# Patient Record
Sex: Female | Born: 1980 | Race: White | Hispanic: No | Smoking: Never smoker
Health system: Southern US, Community
[De-identification: ages and names within clinical notes are randomized; demographics above are authoritative.]

## PROBLEM LIST (undated history)

## (undated) ENCOUNTER — Inpatient Hospital Stay: Payer: Self-pay

## (undated) DIAGNOSIS — L409 Psoriasis, unspecified: Secondary | ICD-10-CM

## (undated) DIAGNOSIS — Z9189 Other specified personal risk factors, not elsewhere classified: Secondary | ICD-10-CM

## (undated) DIAGNOSIS — Z803 Family history of malignant neoplasm of breast: Secondary | ICD-10-CM

## (undated) DIAGNOSIS — N159 Renal tubulo-interstitial disease, unspecified: Secondary | ICD-10-CM

## (undated) DIAGNOSIS — R87612 Low grade squamous intraepithelial lesion on cytologic smear of cervix (LGSIL): Secondary | ICD-10-CM

## (undated) DIAGNOSIS — G43909 Migraine, unspecified, not intractable, without status migrainosus: Secondary | ICD-10-CM

## (undated) DIAGNOSIS — R51 Headache: Secondary | ICD-10-CM

## (undated) DIAGNOSIS — J45909 Unspecified asthma, uncomplicated: Secondary | ICD-10-CM

## (undated) DIAGNOSIS — K5000 Crohn's disease of small intestine without complications: Secondary | ICD-10-CM

## (undated) DIAGNOSIS — I10 Essential (primary) hypertension: Secondary | ICD-10-CM

## (undated) DIAGNOSIS — K649 Unspecified hemorrhoids: Secondary | ICD-10-CM

## (undated) DIAGNOSIS — N809 Endometriosis, unspecified: Secondary | ICD-10-CM

## (undated) DIAGNOSIS — K589 Irritable bowel syndrome without diarrhea: Secondary | ICD-10-CM

## (undated) DIAGNOSIS — Z889 Allergy status to unspecified drugs, medicaments and biological substances status: Secondary | ICD-10-CM

## (undated) DIAGNOSIS — R519 Headache, unspecified: Secondary | ICD-10-CM

## (undated) DIAGNOSIS — G8929 Other chronic pain: Secondary | ICD-10-CM

## (undated) DIAGNOSIS — E669 Obesity, unspecified: Secondary | ICD-10-CM

## (undated) DIAGNOSIS — F419 Anxiety disorder, unspecified: Secondary | ICD-10-CM

## (undated) DIAGNOSIS — F319 Bipolar disorder, unspecified: Secondary | ICD-10-CM

## (undated) DIAGNOSIS — M1990 Unspecified osteoarthritis, unspecified site: Secondary | ICD-10-CM

## (undated) DIAGNOSIS — L4050 Arthropathic psoriasis, unspecified: Secondary | ICD-10-CM

## (undated) DIAGNOSIS — Z1371 Encounter for nonprocreative screening for genetic disease carrier status: Secondary | ICD-10-CM

## (undated) HISTORY — DX: Other chronic pain: G89.29

## (undated) HISTORY — DX: Crohn's disease of small intestine without complications: K50.00

## (undated) HISTORY — DX: Irritable bowel syndrome without diarrhea: K58.9

## (undated) HISTORY — PX: COLONOSCOPY: SHX174

## (undated) HISTORY — DX: Allergy status to unspecified drugs, medicaments and biological substances: Z88.9

## (undated) HISTORY — DX: Endometriosis, unspecified: N80.9

## (undated) HISTORY — DX: Arthropathic psoriasis, unspecified: L40.50

## (undated) HISTORY — DX: Bipolar disorder, unspecified: F31.9

## (undated) HISTORY — DX: Family history of malignant neoplasm of breast: Z80.3

## (undated) HISTORY — PX: EAR TUBE REMOVAL: SHX1486

## (undated) HISTORY — DX: Low grade squamous intraepithelial lesion on cytologic smear of cervix (LGSIL): R87.612

## (undated) HISTORY — DX: Other specified personal risk factors, not elsewhere classified: Z91.89

## (undated) HISTORY — PX: BREAST BIOPSY: SHX20

## (undated) HISTORY — DX: Anxiety disorder, unspecified: F41.9

## (undated) HISTORY — PX: COLONOSCOPY W/ BIOPSIES: SHX1374

## (undated) HISTORY — PX: TUBAL LIGATION: SHX77

## (undated) HISTORY — DX: Unspecified hemorrhoids: K64.9

## (undated) HISTORY — PX: LAPAROSCOPIC HYSTERECTOMY: SHX1926

## (undated) HISTORY — DX: Obesity, unspecified: E66.9

## (undated) HISTORY — DX: Unspecified osteoarthritis, unspecified site: M19.90

## (undated) HISTORY — DX: Headache: R51

## (undated) HISTORY — PX: ESOPHAGOGASTRODUODENOSCOPY: SHX1529

## (undated) HISTORY — DX: Headache, unspecified: R51.9

## (undated) HISTORY — PX: TYMPANOSTOMY TUBE PLACEMENT: SHX32

## (undated) HISTORY — PX: LAPAROSCOPIC CHOLECYSTECTOMY: SUR755

## (undated) HISTORY — DX: Unspecified asthma, uncomplicated: J45.909

## (undated) HISTORY — DX: Encounter for nonprocreative screening for genetic disease carrier status: Z13.71

## (undated) HISTORY — DX: Psoriasis, unspecified: L40.9

---

## 2004-04-30 ENCOUNTER — Ambulatory Visit: Payer: Self-pay | Admitting: Family Medicine

## 2004-08-26 ENCOUNTER — Ambulatory Visit: Payer: Self-pay | Admitting: Family Medicine

## 2004-08-29 ENCOUNTER — Ambulatory Visit: Payer: Self-pay | Admitting: Family Medicine

## 2004-10-14 ENCOUNTER — Ambulatory Visit: Payer: Self-pay | Admitting: Family Medicine

## 2004-11-21 ENCOUNTER — Emergency Department: Payer: Self-pay | Admitting: Emergency Medicine

## 2005-03-31 ENCOUNTER — Ambulatory Visit: Payer: Self-pay | Admitting: Internal Medicine

## 2005-05-13 ENCOUNTER — Ambulatory Visit: Payer: Self-pay | Admitting: Family Medicine

## 2005-08-13 ENCOUNTER — Ambulatory Visit: Payer: Self-pay | Admitting: Internal Medicine

## 2005-08-18 ENCOUNTER — Ambulatory Visit: Payer: Self-pay | Admitting: Internal Medicine

## 2005-09-01 ENCOUNTER — Ambulatory Visit: Payer: Self-pay | Admitting: Internal Medicine

## 2005-09-10 ENCOUNTER — Ambulatory Visit: Payer: Self-pay | Admitting: Family Medicine

## 2005-10-28 ENCOUNTER — Ambulatory Visit: Payer: Self-pay | Admitting: Internal Medicine

## 2005-10-31 ENCOUNTER — Ambulatory Visit: Payer: Self-pay | Admitting: Internal Medicine

## 2005-11-03 ENCOUNTER — Ambulatory Visit: Payer: Self-pay | Admitting: Cardiology

## 2005-11-05 ENCOUNTER — Ambulatory Visit: Payer: Self-pay | Admitting: Internal Medicine

## 2006-01-21 ENCOUNTER — Ambulatory Visit: Payer: Self-pay | Admitting: Internal Medicine

## 2006-03-23 ENCOUNTER — Ambulatory Visit: Payer: Self-pay | Admitting: Internal Medicine

## 2006-03-24 ENCOUNTER — Emergency Department: Payer: Self-pay | Admitting: Emergency Medicine

## 2006-04-27 ENCOUNTER — Ambulatory Visit: Payer: Self-pay | Admitting: Internal Medicine

## 2006-05-19 ENCOUNTER — Ambulatory Visit: Payer: Self-pay | Admitting: Internal Medicine

## 2006-05-28 ENCOUNTER — Ambulatory Visit: Payer: Self-pay | Admitting: Internal Medicine

## 2006-08-29 DIAGNOSIS — F410 Panic disorder [episodic paroxysmal anxiety] without agoraphobia: Secondary | ICD-10-CM

## 2006-09-30 ENCOUNTER — Ambulatory Visit: Payer: Self-pay | Admitting: Family Medicine

## 2007-08-09 ENCOUNTER — Ambulatory Visit: Payer: Self-pay | Admitting: Internal Medicine

## 2007-08-09 DIAGNOSIS — R109 Unspecified abdominal pain: Secondary | ICD-10-CM | POA: Insufficient documentation

## 2007-08-12 ENCOUNTER — Ambulatory Visit: Payer: Self-pay | Admitting: Internal Medicine

## 2007-08-12 DIAGNOSIS — K625 Hemorrhage of anus and rectum: Secondary | ICD-10-CM

## 2007-08-12 LAB — CONVERTED CEMR LAB
ALT: 65 units/L — ABNORMAL HIGH (ref 0–35)
AST: 40 units/L — ABNORMAL HIGH (ref 0–37)
Albumin: 3.8 g/dL (ref 3.5–5.2)
Alkaline Phosphatase: 91 units/L (ref 39–117)
Amylase: 68 units/L (ref 27–131)
BUN: 15 mg/dL (ref 6–23)
Basophils Absolute: 0.2 10*3/uL — ABNORMAL HIGH (ref 0.0–0.1)
Basophils Relative: 2.4 % — ABNORMAL HIGH (ref 0.0–1.0)
Bilirubin, Direct: 0.1 mg/dL (ref 0.0–0.3)
CO2: 29 meq/L (ref 19–32)
Calcium: 9.3 mg/dL (ref 8.4–10.5)
Chloride: 106 meq/L (ref 96–112)
Creatinine, Ser: 0.7 mg/dL (ref 0.4–1.2)
Eosinophils Absolute: 0.2 10*3/uL (ref 0.0–0.6)
Eosinophils Relative: 2.2 % (ref 0.0–5.0)
GFR calc Af Amer: 132 mL/min
GFR calc non Af Amer: 109 mL/min
Glucose, Bld: 80 mg/dL (ref 70–99)
H Pylori IgG: NEGATIVE
HCT: 41.3 % (ref 36.0–46.0)
Hemoglobin: 13.7 g/dL (ref 12.0–15.0)
Lipase: 32 units/L (ref 11.0–59.0)
Lymphocytes Relative: 25.1 % (ref 12.0–46.0)
MCHC: 33.1 g/dL (ref 30.0–36.0)
MCV: 87.2 fL (ref 78.0–100.0)
Monocytes Absolute: 0.2 10*3/uL (ref 0.2–0.7)
Monocytes Relative: 2.9 % — ABNORMAL LOW (ref 3.0–11.0)
Neutro Abs: 4.7 10*3/uL (ref 1.4–7.7)
Neutrophils Relative %: 67.4 % (ref 43.0–77.0)
Phosphorus: 3.9 mg/dL (ref 2.3–4.6)
Platelets: 281 10*3/uL (ref 150–400)
Potassium: 4.1 meq/L (ref 3.5–5.1)
RBC: 4.74 M/uL (ref 3.87–5.11)
RDW: 11.8 % (ref 11.5–14.6)
Sodium: 141 meq/L (ref 135–145)
TSH: 1.15 microintl units/mL (ref 0.35–5.50)
Total Bilirubin: 0.4 mg/dL (ref 0.3–1.2)
Total Protein: 7.1 g/dL (ref 6.0–8.3)
WBC: 7.1 10*3/uL (ref 4.5–10.5)

## 2007-08-16 ENCOUNTER — Ambulatory Visit: Payer: Self-pay | Admitting: Internal Medicine

## 2007-08-22 DIAGNOSIS — K589 Irritable bowel syndrome without diarrhea: Secondary | ICD-10-CM

## 2007-08-22 DIAGNOSIS — K5000 Crohn's disease of small intestine without complications: Secondary | ICD-10-CM

## 2007-08-22 DIAGNOSIS — K649 Unspecified hemorrhoids: Secondary | ICD-10-CM

## 2007-08-22 HISTORY — DX: Crohn's disease of small intestine without complications: K50.00

## 2007-08-22 HISTORY — DX: Unspecified hemorrhoids: K64.9

## 2007-08-22 HISTORY — DX: Irritable bowel syndrome, unspecified: K58.9

## 2007-08-23 ENCOUNTER — Encounter: Payer: Self-pay | Admitting: Internal Medicine

## 2007-08-23 ENCOUNTER — Ambulatory Visit: Payer: Self-pay | Admitting: Internal Medicine

## 2007-08-23 LAB — HM COLONOSCOPY

## 2007-09-13 ENCOUNTER — Ambulatory Visit: Payer: Self-pay | Admitting: Internal Medicine

## 2007-09-16 ENCOUNTER — Encounter: Admission: RE | Admit: 2007-09-16 | Discharge: 2007-09-16 | Payer: Self-pay | Admitting: Internal Medicine

## 2007-09-22 ENCOUNTER — Ambulatory Visit: Payer: Self-pay | Admitting: Internal Medicine

## 2007-09-27 ENCOUNTER — Ambulatory Visit: Payer: Self-pay | Admitting: Internal Medicine

## 2007-10-18 ENCOUNTER — Ambulatory Visit (HOSPITAL_COMMUNITY): Admission: RE | Admit: 2007-10-18 | Discharge: 2007-10-18 | Payer: Self-pay | Admitting: Internal Medicine

## 2007-10-18 ENCOUNTER — Encounter: Payer: Self-pay | Admitting: Internal Medicine

## 2007-10-21 ENCOUNTER — Ambulatory Visit: Payer: Self-pay | Admitting: Internal Medicine

## 2007-10-25 ENCOUNTER — Encounter (INDEPENDENT_AMBULATORY_CARE_PROVIDER_SITE_OTHER): Payer: Self-pay | Admitting: *Deleted

## 2007-10-25 ENCOUNTER — Ambulatory Visit (HOSPITAL_COMMUNITY): Admission: RE | Admit: 2007-10-25 | Discharge: 2007-10-25 | Payer: Self-pay | Admitting: Internal Medicine

## 2007-11-22 HISTORY — PX: LAPAROSCOPIC CHOLECYSTECTOMY: SUR755

## 2007-11-30 ENCOUNTER — Encounter: Payer: Self-pay | Admitting: Internal Medicine

## 2007-12-02 ENCOUNTER — Ambulatory Visit (HOSPITAL_COMMUNITY): Admission: RE | Admit: 2007-12-02 | Discharge: 2007-12-02 | Payer: Self-pay | Admitting: General Surgery

## 2007-12-02 ENCOUNTER — Encounter (INDEPENDENT_AMBULATORY_CARE_PROVIDER_SITE_OTHER): Payer: Self-pay | Admitting: General Surgery

## 2007-12-27 ENCOUNTER — Ambulatory Visit: Payer: Self-pay | Admitting: Internal Medicine

## 2007-12-27 LAB — CONVERTED CEMR LAB: Rapid Strep: NEGATIVE

## 2008-01-11 ENCOUNTER — Ambulatory Visit: Payer: Self-pay | Admitting: Family Medicine

## 2008-02-04 ENCOUNTER — Ambulatory Visit: Payer: Self-pay | Admitting: Family Medicine

## 2008-02-04 DIAGNOSIS — J029 Acute pharyngitis, unspecified: Secondary | ICD-10-CM

## 2008-02-04 LAB — CONVERTED CEMR LAB: Rapid Strep: NEGATIVE

## 2008-04-07 ENCOUNTER — Ambulatory Visit: Payer: Self-pay | Admitting: Family Medicine

## 2008-04-07 DIAGNOSIS — R062 Wheezing: Secondary | ICD-10-CM

## 2008-06-29 ENCOUNTER — Ambulatory Visit: Payer: Self-pay | Admitting: Family Medicine

## 2008-06-29 DIAGNOSIS — L259 Unspecified contact dermatitis, unspecified cause: Secondary | ICD-10-CM

## 2008-08-18 ENCOUNTER — Emergency Department: Payer: Self-pay | Admitting: Internal Medicine

## 2008-08-18 ENCOUNTER — Emergency Department (HOSPITAL_COMMUNITY): Admission: EM | Admit: 2008-08-18 | Discharge: 2008-08-19 | Payer: Self-pay | Admitting: Emergency Medicine

## 2008-11-08 ENCOUNTER — Ambulatory Visit: Payer: Self-pay | Admitting: Family Medicine

## 2008-11-08 ENCOUNTER — Encounter (INDEPENDENT_AMBULATORY_CARE_PROVIDER_SITE_OTHER): Payer: Self-pay | Admitting: Internal Medicine

## 2008-11-16 ENCOUNTER — Ambulatory Visit: Payer: Self-pay | Admitting: Emergency Medicine

## 2009-01-01 ENCOUNTER — Ambulatory Visit: Payer: Self-pay | Admitting: Emergency Medicine

## 2009-01-01 ENCOUNTER — Encounter: Payer: Self-pay | Admitting: Emergency Medicine

## 2009-03-30 ENCOUNTER — Ambulatory Visit: Payer: Self-pay | Admitting: Family Medicine

## 2009-04-01 ENCOUNTER — Emergency Department: Payer: Self-pay | Admitting: Emergency Medicine

## 2009-04-04 ENCOUNTER — Ambulatory Visit: Payer: Self-pay | Admitting: Orthopedic Surgery

## 2009-05-22 ENCOUNTER — Ambulatory Visit: Payer: Self-pay | Admitting: Family Medicine

## 2009-05-22 DIAGNOSIS — J209 Acute bronchitis, unspecified: Secondary | ICD-10-CM

## 2009-08-10 ENCOUNTER — Telehealth: Payer: Self-pay | Admitting: Family Medicine

## 2009-08-10 ENCOUNTER — Encounter: Payer: Self-pay | Admitting: Family Medicine

## 2009-08-13 ENCOUNTER — Ambulatory Visit: Payer: Self-pay | Admitting: Family Medicine

## 2009-08-13 DIAGNOSIS — R5383 Other fatigue: Secondary | ICD-10-CM

## 2009-08-13 DIAGNOSIS — R5381 Other malaise: Secondary | ICD-10-CM

## 2009-08-15 ENCOUNTER — Telehealth: Payer: Self-pay | Admitting: Family Medicine

## 2009-08-15 LAB — CONVERTED CEMR LAB
BUN: 11 mg/dL (ref 6–23)
Basophils Absolute: 0 10*3/uL (ref 0.0–0.1)
Basophils Relative: 0.5 % (ref 0.0–3.0)
CO2: 29 meq/L (ref 19–32)
Calcium: 8.8 mg/dL (ref 8.4–10.5)
Chloride: 111 meq/L (ref 96–112)
Creatinine, Ser: 0.7 mg/dL (ref 0.4–1.2)
Eosinophils Absolute: 0.3 10*3/uL (ref 0.0–0.7)
Eosinophils Relative: 4.5 % (ref 0.0–5.0)
Folate: 5.5 ng/mL
GFR calc non Af Amer: 107.02 mL/min (ref >60)
Glucose, Bld: 96 mg/dL (ref 70–99)
HCT: 41.7 % (ref 36.0–46.0)
Hemoglobin: 13.8 g/dL (ref 12.0–15.0)
Lymphocytes Relative: 26.7 % (ref 12.0–46.0)
Lymphs Abs: 1.6 10*3/uL (ref 0.7–4.0)
MCHC: 33.2 g/dL (ref 30.0–36.0)
MCV: 89.2 fL (ref 78.0–100.0)
Monocytes Absolute: 0.3 10*3/uL (ref 0.1–1.0)
Monocytes Relative: 4.4 % (ref 3.0–12.0)
Neutro Abs: 3.8 10*3/uL (ref 1.4–7.7)
Neutrophils Relative %: 63.9 % (ref 43.0–77.0)
Platelets: 229 10*3/uL (ref 150.0–400.0)
Potassium: 4 meq/L (ref 3.5–5.1)
RBC: 4.67 M/uL (ref 3.87–5.11)
RDW: 11.9 % (ref 11.5–14.6)
Rheumatoid fact SerPl-aCnc: 27.1 intl units/mL — ABNORMAL HIGH (ref 0.0–20.0)
Sed Rate: 11 mm/hr (ref 0–22)
Sodium: 143 meq/L (ref 135–145)
TSH: 0.73 microintl units/mL (ref 0.35–5.50)
Vitamin B-12: 472 pg/mL (ref 211–911)
WBC: 6 10*3/uL (ref 4.5–10.5)

## 2009-08-20 ENCOUNTER — Encounter: Admission: RE | Admit: 2009-08-20 | Discharge: 2009-08-20 | Payer: Self-pay | Admitting: Family Medicine

## 2009-09-17 ENCOUNTER — Encounter: Payer: Self-pay | Admitting: Family Medicine

## 2009-10-08 ENCOUNTER — Encounter: Payer: Self-pay | Admitting: Internal Medicine

## 2009-12-06 ENCOUNTER — Encounter: Payer: Self-pay | Admitting: Family Medicine

## 2009-12-06 ENCOUNTER — Ambulatory Visit: Payer: Self-pay | Admitting: Internal Medicine

## 2010-02-08 ENCOUNTER — Ambulatory Visit: Payer: Self-pay | Admitting: Family Medicine

## 2010-02-08 LAB — CONVERTED CEMR LAB
Bilirubin Urine: NEGATIVE
Nitrite: NEGATIVE
Urobilinogen, UA: 0.2

## 2010-02-09 ENCOUNTER — Encounter: Payer: Self-pay | Admitting: Family Medicine

## 2010-02-18 ENCOUNTER — Encounter: Admission: RE | Admit: 2010-02-18 | Discharge: 2010-02-18 | Payer: Self-pay | Admitting: Family Medicine

## 2010-03-21 ENCOUNTER — Telehealth: Payer: Self-pay | Admitting: Family Medicine

## 2010-03-21 ENCOUNTER — Ambulatory Visit: Payer: Self-pay | Admitting: Family Medicine

## 2010-03-25 ENCOUNTER — Encounter: Payer: Self-pay | Admitting: Internal Medicine

## 2010-07-22 ENCOUNTER — Other Ambulatory Visit: Payer: Self-pay | Admitting: *Deleted

## 2010-07-22 DIAGNOSIS — Z1239 Encounter for other screening for malignant neoplasm of breast: Secondary | ICD-10-CM

## 2010-07-23 NOTE — Progress Notes (Signed)
Summary: needs work note   Phone Note Call from Patient Call back at [phone]   Caller: Patient Call For: Dr. Dayton Martes Summary of Call: Patient says that she missed three days of work this week due to a stomach virus. She says that she did not feel that she needed to be seen nor did she feel well enough to come in, she returned to work this morning and her manager said that she needs a work note. Patient wants to know if she can get a work note for tue,wed,thurs of this week.  Initial call taken by: Melody Comas,  August 10, 2009 9:56 AM  Follow-up for Phone Call        pt scheduled with Dr. Dayton Martes @ 2:15 DeShannon Katrinka Blazing CMA Duncan Dull)  August 10, 2009 9:57 AM   per Dr. Dayton Martes, pt does not need to be seen, she needs work note. Spoke with pt and gave work note. Follow-up by: Mervin Hack CMA Duncan Dull),  August 10, 2009 10:01 AM     Appended Document: needs work note  LMOM at work to call.

## 2010-07-23 NOTE — Progress Notes (Signed)
Summary: pt agrees to referral  Phone Note Call from Patient Call back at [phone]   Caller: Patient Call For: Dr. Dayton Martes Summary of Call: Advised pt of lab results.  She agrees to rheumatology referral, she has no preference where. Initial call taken by: Lowella Petties CMA,  August 15, 2009 10:16 AM

## 2010-07-23 NOTE — Letter (Signed)
Summary: Annual Exam-[facility]  Annual Exam-[facility]   Imported By: Maryln Gottron 04/01/2010 14:11:15  _____________________________________________________________________  External Attachment:    Type:   Image     Comment:   External Document  Appended Document: Annual [facility] Normal exam Ultrasound ordered for pain

## 2010-07-23 NOTE — Consult Note (Signed)
Summary: [facility]  [facility]   Imported By: Lanelle Bal 09/28/2009 08:29:36  _____________________________________________________________________  External Attachment:    Type:   Image     Comment:   External Document  Appended Document: [facility] Rheumatology myalgias and arthritis with pos RA with h/o psoriasis. anti CCP pending. Started NSAIDs.

## 2010-07-23 NOTE — Progress Notes (Signed)
Summary: still has kidney stone  Phone Note Call from Patient Call back at [phone]   Caller: Patient Complaint: Cough/Sore throat Summary of Call: Pt was seen last month for a kidney stoney.  She says this has not passed and she is in a lot of pain today. Vomiting today with this, she says she doesnt tolerate pain very well.  She is asking what she needs to do.   She doesnt have any pain medicine, other than taking 800 mg's of ibuprofen daily for hip pain.  Uses [facility].   No known fever.  Pain is on left side and in the back.  Blood in urine yesterday at work, by dip. Initial call taken by: Lowella Petties CMA,  March 21, 2010 12:39 PM  Follow-up for Phone Call        She needs an OV. Follow-up by: Crawford Givens MD,  March 21, 2010 1:29 PM  Additional Follow-up for Phone Call Additional follow up Details #1::        Appointment scheduled  3:30 p.m. today. Additional Follow-up by: Delilah Shan CMA Duncan Dull),  March 21, 2010 2:26 PM

## 2010-07-23 NOTE — Assessment & Plan Note (Signed)
Summary: CHECK LUMPS IN BREAST,JOINT PAIN/CLE   Vital Signs:  Patient profile:   30 year old female Height:      63 inches Weight:      187.25 pounds BMI:     33.29 Temp:     98.2 degrees F oral Pulse rate:   76 / minute Pulse rhythm:   regular BP sitting:   112 / 80  (left arm) Cuff size:   large  Vitals Entered By: Delilah Shan CMA Duncan Dull) (August 13, 2009 8:19 AM) CC: Check lump in breast.  Joint pain.   History of Present Illness: 30 yo here for acute visit with multiple complaints.  1.  Lump in left breast- felt lump in left breast last week.  Was painful to palpation, around 12 oclock position.  Was on her period last week, now lump is gone but she is worried because her mom had breast cancer at 10 yo.  She has also been very fatigued lately and concerned this is related.  No changes in appetite, weight loss, night sweats.  2.  Fatigue- Ongoing for at least a year, feels it is getting worse.  Denies any symptoms of depression.  No DOE. Just feels tired regardless of how much sleep she gets.  Periods are heavy but regular.  No sycope, pre sycope, LE edema or chest pain.  No HA.  3.  All over joint pain- feels that all joints hurt, on and off for years.  Mainly bilateral hands a elbows.  Sometimes are red and swollen, other times are not.  Pain is worse in the morning gets better throughout the day.  Does have FH of RA.  Current Medications (verified): 1)  Ventolin Hfa 108 (90 Base) Mcg/act Aers (Albuterol Sulfate) .... 2 Puffs Q4h As Needed Wheezing 2)  Ortho Tri-Cyclen Lo 0.025 Mg Tabs (Norgestimate-Ethinyl Estradiol) .... One Daily 3)  Benadryl 25 Mg Caps (Diphenhydramine Hcl) .... Once Daily 4)  Zyrtec Allergy 10 Mg Caps (Cetirizine Hcl) .... Take 1 Tablet By Mouth Once A Day  Allergies: 1)  ! * Ketek 2)  ! Prozac  Review of Systems      See HPI General:  Denies chills and fever. Eyes:  Denies blurring. CV:  Denies chest pain or discomfort. Resp:  Denies shortness  of breath. GI:  Denies abdominal pain and bloody stools. MS:  Denies muscle weakness. Derm:  Denies rash. Psych:  Denies anxiety and depression. Heme:  Denies abnormal bruising, bleeding, and fevers.  Physical Exam  General:  Well-developed,well-nourished,in no acute distress; alert,appropriate and cooperative throughout examination Ears:  External ear exam shows no significant lesions or deformities.  Otoscopic examination reveals clear canals, tympanic membranes are intact bilaterally without bulging, retraction, inflammation or discharge. Hearing is grossly normal bilaterally. Mouth:  no deformity or lesions Neck:  No deformities, masses, or tenderness noted. Breasts:  No mass, nodules, thickening, tenderness, bulging, retraction, inflamation, nipple discharge or skin changes noted.   Lungs:  Normal respiratory effort, chest expands symmetrically. Lungs are clear to auscultation, no crackles or wheezes. Heart:  Normal rate and regular rhythm. S1 and S2 normal without gallop, murmur, click, rub or other extra sounds. Abdomen:  Bowel sounds positive,abdomen soft and non-tender without masses, organomegaly or hernias noted. Msk:  no joint swelling, no joint warmth, no redness over joints, no joint deformities, no joint instability, and no crepitation.   Extremities:  no edema Axillary Nodes:  No palpable lymphadenopathy Psych:  Cognition and judgment appear intact. Alert and  cooperative with normal attention span and concentration. No apparent delusions, illusions, hallucinations   Impression & Recommendations:  Problem # 1:  BREAST MASS, LEFT (ICD-611.72) Assessment New Unable to find lump but given family history will go ahead an send for mammogram. Orders: Radiology Referral (Radiology)  Problem # 2:  UNSPECIFIED ARTHROPATHY MULTIPLE SITES (ICD-716.99) Assessment: New Ongoing for several months, most likely a pain syndrome/fibromyalgia.  Will check blood work today to rule out  other polyarthropathies.  No signs of join redness, swelling or tenderness on physical exam today. Orders: Venipuncture (71062) TLB-Rheumatoid Factor (RA) (69485-IO) TLB-Sedimentation Rate (ESR) (85652-ESR)  Problem # 3:  FATIGUE (ICD-780.79) Assessment: New Perhaps related to above, but will check B12/folate, CBC, BMET and TSH to rule out other reversible causes. Orders: Venipuncture (27035) TLB-B12 + Folate Pnl (00938_18299-B71/IRC) TLB-CBC Platelet - w/Differential (85025-CBCD) TLB-BMP (Basic Metabolic Panel-BMET) (80048-METABOL) TLB-TSH (Thyroid Stimulating Hormone) (84443-TSH)  Complete Medication List: 1)  Ventolin Hfa 108 (90 Base) Mcg/act Aers (Albuterol sulfate) .... 2 puffs q4h as needed wheezing 2)  Ortho Tri-cyclen Lo 0.025 Mg Tabs (Norgestimate-ethinyl estradiol) .... One daily 3)  Benadryl 25 Mg Caps (Diphenhydramine hcl) .... Once daily 4)  Zyrtec Allergy 10 Mg Caps (Cetirizine hcl) .... Take 1 tablet by mouth once a day  Patient Instructions: 1)  Good to see you. 2)  Please stop by to see Shirlee Limerick on your way out to set up your mammogram.  Current Allergies (reviewed today): ! * KETEK ! PROZAC

## 2010-07-23 NOTE — Letter (Signed)
Summary: Out of Work  [facility] [employer] [facility]  [address] [place], [place] [address]   Phone: [phone]  Fax: [phone]    August 10, 2009   Employee:  Bridget Maldonado    To Whom It May Concern:   For Medical reasons, please excuse the above named employee from work for the following dates:  Start:   08/07/2009  End:   08/09/2009 patient returning to work 08/10/2009  If you need additional information, please feel free to contact our office.         Sincerely,      Ruthe Mannan, MD

## 2010-07-23 NOTE — Assessment & Plan Note (Signed)
Summary: ? kidney stones/lsf   Vital Signs:  Patient profile:   30 year old female Height:      63 inches Weight:      183.75 pounds BMI:     32.67 Temp:     98.2 degrees F oral Pulse rate:   92 / minute Pulse rhythm:   regular BP sitting:   106 / 84  (left arm) Cuff size:   large  Vitals Entered By: Delilah Shan CMA Duncan Dull) (March 21, 2010 3:26 PM) CC: ? kidney stones.  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  Pt. has her menstrual period today.   History of Present Illness: Sporadic back pain.  Blood in urine yesterday but patient is on menses.  Was checked 2 weeks ago and had blood on dipstick at that point, too.  "It's never a continual pain".  When it happens "I get goosebumps from the pain."  Similar to when patient had GB pain (s/p chole).  Has follow up with gyn on Monday.    No stone was collected from the first episode- it was a presumed stone.  See prev OV note.   Vomited this AM, "but I think that was from having a low pain tolerance."  "I've always thrown up if I have pain."  No vomiting since this AM.  No fevers and none recently.    Pain is in lower abdomen and occ bilateral.    Mother had endometriosis at about this stage in life.    Taking ibuprofen 800mg .  Intolerant of tramadol.   H/o episodic wheeze, rare SABA use, needs refill.   Allergies: 1)  ! * Ketek 2)  ! Prozac 3)  ! Tramadol Hcl  Review of Systems       See HPI.  Otherwise negative.  no vag discharge   Physical Exam  General:  GEN: nad, alert and oriented HEENT: mucous membranes moist NECK: supple w/o LA CV: rrr.  PULM: ctab, no inc wob ABD: soft, +bs, no rebound, no pain on exam EXT: no edema SKIN: no acute rash    Impression & Recommendations:  Problem # 1:  ABDOMINAL PAIN, ACUTE (ICD-789.00) I d/w patient that I don't think this is related to renal stones.  I would follow up with gyn and then if no source found, she'll call me to discuss.  She may need uro eval at that point.   She understood.  Use ibuprofen in meantime.   Problem # 2:  WHEEZING (ICD-786.07) 3 coupon cards given for proventil.  Fu if needing this med frequently.  She agrees.   Complete Medication List: 1)  Proventil Hfa 108 (90 Base) Mcg/act Aers (Albuterol sulfate) .... 2 puffs q4h as needed for cough and wheeze. 2)  Ortho Tri-cyclen Lo 0.025 Mg Tabs (Norgestimate-ethinyl estradiol) .... One daily 3)  Benadryl 25 Mg Caps (Diphenhydramine hcl) .... Once daily 4)  Zyrtec Allergy 10 Mg Caps (Cetirizine hcl) .... Take 1 tablet by mouth once a day as needed 5)  Ibuprofen 800 Mg Tabs (Ibuprofen) .Marland Kitchen.. 1 by mouth three times a day  Patient Instructions: 1)  If you need to take the ibuprofen in the meantime, take it with food.  Follow up with Gyn on Monday and let me know if you aren't improving.  Call me and we can talk about options at that point.   2)  Take care.  Prescriptions: PROVENTIL HFA 108 (90 BASE) MCG/ACT AERS (ALBUTEROL SULFATE) 2 puffs q4h as needed for cough and wheeze.  #  1 x 12   Entered and Authorized by:   Crawford Givens MD   Signed by:   Crawford Givens MD on 03/21/2010   Method used:   Electronically to        [facility]* (retail)       [address]       [place], [place]  [address]       Ph: [phone]       Fax: [phone]   RxID:   [id number]   Current Allergies (reviewed today): ! Johnathan Hausen ! PROZAC ! TRAMADOL HCL

## 2010-07-23 NOTE — Assessment & Plan Note (Signed)
Summary: FEVER, NECK PAIN   Vital Signs:  Patient profile:   30 year old female Height:      63 inches Weight:      189.25 pounds BMI:     33.65 Temp:     98.9 degrees F oral Pulse rate:   96 / minute Pulse rhythm:   regular Resp:     12 per minute BP sitting:   124 / 78  (left arm) Cuff size:   regular  Vitals Entered By: Lewanda Rife LPN (December 06, 2009 3:55 PM) CC: Fever, neck pain that runs down into back and headache Pain level now is 8.   History of Present Illness: Started feeling sick about 3 days ago worsened since then neck hurts terrible---some stiffness but not striking now hurting in lower back and around flanks Fever up to 102.2 yesterday evening---better with meds (ibuprofen and tylenol) better today with antipyretics but they don't help her neck  no energy---even to get up and walk in house  Slight dry cough no SOB  No dysuria no increased frequency  No raah no known tick bite    Allergies: 1)  ! * Ketek 2)  ! Prozac  Past History:  Past medical, surgical, family and social histories (including risk factors) reviewed for relevance to current acute and chronic problems.  Past Medical History: Anxiety--possible bipolar type 2 Chronic headaches Psoriasis  Past Surgical History: Reviewed history from 12/27/2007 and no changes required. ultrasound gallbladder negative 9/02 Lap chole 6/09 (Dr Lindie Spruce)  Family History: Reviewed history from 08/09/2007 and no changes required. Dad--asthma, severe anxiety(possible bipolar) Mom--breast cancer,RA Family History Breast cancer 1st degree relative <50 DM--Mat great uncles Alzheimers on Dad's side Mat GF--Parkinsons Colon cancer--Mat GGF 1 brother No history of colitis or Crohn's disease that she is aware of  Social History: Reviewed history from 11/16/2008 and no changes required. 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   Head:  no sinus tenderness Mouth:  no erythema and no exudates.   Neck:  supple, no cervical lymphadenopathy, and no neck tenderness.  Fairly normal passive ROM without spasm Lungs:  normal respiratory effort, no intercostal retractions, no accessory muscle use, and normal breath sounds.   Abdomen:  soft, non-tender, and no masses.   Msk:  no joint tenderness and no joint swelling.   Extremities:  no edema Skin:  no suspicious lesions and no ulcerations.   No sig rash except mild psoriatic changes on elbows Psych:  normally interactive and good eye contact.     Impression & Recommendations:  Problem # 1:  FEVER (ICD-780.60) Assessment New no clear etiology most achy but nothing to suggest bacterial etiology Neck findings don't suggest meningitis no tick exposure and no rash---neck but no headache either  P: continue ibuprofen as needed      needs reeval if doesn't continue to improve  Complete Medication List: 1)  Ventolin Hfa 108 (90 Base) Mcg/act Aers (Albuterol sulfate) .... 2 puffs every 4 hours  as needed wheezing 2)  Ortho Tri-cyclen Lo 0.025 Mg Tabs (Norgestimate-ethinyl estradiol) .... One daily 3)  Benadryl 25 Mg Caps (Diphenhydramine hcl) .... Once daily 4)  Zyrtec Allergy 10 Mg Caps (Cetirizine hcl) .... Take 1 tablet by mouth once a day  as needed 5)  Ibuprofen 200 Mg Tabs (Ibuprofen) .... Otc as directed.  Patient Instructions: 1)  Please schedule a follow-up appointment as needed .   Current Allergies (reviewed today): ! * KETEK ! PROZAC

## 2010-07-23 NOTE — Letter (Signed)
Summary: Out of Work  [facility] at [facility]  [address] [place], [place] [address]   Phone: [phone]  Fax: [phone]    December 06, 2009   Employee:  ALICYA BENA    To Whom It May Concern:   For Medical reasons, please excuse the above named employee from work for the following dates:  Start:   12/05/09  End:   12/07/09 (Pt hopefully will be able to return to work on 12/07/09  If you need additional information, please feel free to contact our office.         Sincerely,    Lewanda Rife LPN

## 2010-07-23 NOTE — Letter (Signed)
Summary: [facility]  [facility]   Imported By: Lanelle Bal 10/22/2009 09:09:26  _____________________________________________________________________  External Attachment:    Type:   Image     Comment:   External Document  Appended Document: [facility] Rhe[facility] ?emerging RA or psoriatic arthritis sticking with ibuprofen and prilosec for now

## 2010-07-23 NOTE — Assessment & Plan Note (Signed)
Summary: ??kidney stone/alc   Vital Signs:  Patient profile:   30 year old female Height:      63 inches Weight:      189 pounds BMI:     33.60 Temp:     98.7 degrees F oral Pulse rate:   88 / minute Pulse rhythm:   regular BP sitting:   122 / 80  (left arm) Cuff size:   large  Vitals Entered By: Delilah Shan CMA Duncan Dull) (February 08, 2010 1:56 PM) CC: ? kidney stone   History of Present Illness: No h/o kidney stones. Abd pain since early in the week. Took some tramadol but had nausea with this.  L flank pain.  Urine checked at work and had blood in urine on dip per report.  Pain is worse today than yesterday.  No burning with urination.  No gross blood in urine.  No discharge, no change in BMs.  She thinks she had a temp of 100.3 on Tuesday.  Temp of  ~100 yesterday.  Vomited yesterday.   Pain fluctuates, mainly in back now.    Allergies: 1)  ! * Ketek 2)  ! Prozac  Past History:  Past Medical History: Anxiety--possible bipolar type 2 Chronic headaches Psoriasis obese  Review of Systems       See HPI.  Otherwise negative.    Physical Exam  General:  GEN: nad, alert and oriented HEENT: mucous membranes moist NECK: supple w/o LA CV: rrr.  PULM: ctab, no inc wob ABD: soft, +bs, no rebound, L flank mildly tender to palpation, L paraspinal musles slighlty tender to palpation in lower thoracic/upper lumbar area EXT: no edema SKIN: no acute rash    Impression & Recommendations:  Problem # 1:  ABDOMINAL PAIN, ACUTE (ICD-789.00) Treat as presumed stone.  Would cover with cipro given the fever.  Check ucx and follow up as needed.  She is nontoxic.  d/w patient re:KUB.  Orders: UA Dipstick w/o Micro (manual) (40981) T-Culture, Urine ([id number]) T-1 View Abdomen (KUB) (74000TC)  Complete Medication List: 1)  Ventolin Hfa 108 (90 Base) Mcg/act Aers (Albuterol sulfate) .... 2 puffs every 4 hours  as needed wheezing 2)  Ortho Tri-cyclen Lo 0.025 Mg Tabs  (Norgestimate-ethinyl estradiol) .... One daily 3)  Benadryl 25 Mg Caps (Diphenhydramine hcl) .... Once daily 4)  Zyrtec Allergy 10 Mg Caps (Cetirizine hcl) .... Take 1 tablet by mouth once a day as needed 5)  Ibuprofen 200 Mg Tabs (Ibuprofen) .... Otc as directed. 6)  Cipro 500 Mg Tabs (Ciprofloxacin hcl) .Marland Kitchen.. 1 by mouth two times a day x10d 7)  Percocet 5-325 Mg Tabs (Oxycodone-acetaminophen) .... 0.5-1 by mouth three times a day as needed for pain  Patient Instructions: 1)  Let us know if your fever continues or if the pain is increasing.  This should get gradually better.   2)  We'll contact you with your lab report.  3)  Please schedule a follow-up appointment as needed .  4)  You can go back to work when you don't have a fever and when you don't need pain meds.  Prescriptions: PERCOCET 5-325 MG TABS (OXYCODONE-ACETAMINOPHEN) 0.5-1 by mouth three times a day as needed for pain  #15 x 0   Entered and Authorized by:   Crawford Givens MD   Signed by:   Crawford Givens MD on 02/08/2010   Method used:   Print then Give to Patient   RxID:   [id number] CIPRO 500 MG TABS (CIPROFLOXACIN HCL)  1 by mouth two times a day x10d  #20 x 0   Entered and Authorized by:   Crawford Givens MD   Signed by:   Crawford Givens MD on 02/08/2010   Method used:   Print then Give to Patient   RxID:   [id number]   Current Allergies (reviewed today): ! Johnathan Hausen ! PROZAC  Laboratory Results   Urine Tests  Date/Time Received: February 08, 2010 2:16 PM   Routine Urinalysis   Color: yellow Appearance: Clear Glucose: negative   (Normal Range: Negative) Bilirubin: negative   (Normal Range: Negative) Ketone: trace (5)   (Normal Range: Negative) Spec. Gravity: 1.015   (Normal Range: 1.003-1.035) Blood: moderate   (Normal Range: Negative) pH: 7.5   (Normal Range: 5.0-8.0) Protein: trace   (Normal Range: Negative) Urobilinogen: 0.2   (Normal Range: 0-1) Nitrite: negative   (Normal Range:  Negative) Leukocyte Esterace: negative   (Normal Range: Negative)

## 2010-08-12 ENCOUNTER — Telehealth: Payer: Self-pay | Admitting: Internal Medicine

## 2010-08-17 ENCOUNTER — Emergency Department: Payer: Self-pay | Admitting: Emergency Medicine

## 2010-08-20 NOTE — Progress Notes (Signed)
Summary: MOMETASONE FUROATE   Phone Note Refill Request Message from:  [facility] on August 12, 2010 12:44 PM  Form on your desk for MOMETASONE FUROATE 0.1% OINTMENT, rx no longer on active med list, last written 08/08/2008.   Method Requested: Fax to Local Pharmacy Initial call taken by: Mervin Hack CMA Duncan Dull),  August 12, 2010 12:45 PM  Follow-up for Phone Call        okay to refill if she confirms she wants it  #1 x 1 Follow-up by: Cindee Salt MD,  August 12, 2010 1:34 PM  Additional Follow-up for Phone Call Additional follow up Details #1::        Rx faxed to pharmacy Additional Follow-up by: DeShannon Smith CMA Duncan Dull),  August 12, 2010 2:26 PM    New/Updated Medications: MOMETASONE FUROATE 0.1 % OINT (MOMETASONE FUROATE) apply two times a day as needed for psoriasis Prescriptions: MOMETASONE FUROATE 0.1 % OINT (MOMETASONE FUROATE) apply two times a day as needed for psoriasis  #45gm x 1   Entered by:   Mervin Hack CMA (AAMA)   Authorized by:   Cindee Salt MD   Signed by:   Mervin Hack CMA (AAMA) on 08/12/2010   Method used:   Electronically to        [facility]* (retail)       [address]       [place], [place]  [address]       Ph: [phone]       Fax: [phone]   RxID:   [id number]

## 2010-08-26 ENCOUNTER — Ambulatory Visit
Admission: RE | Admit: 2010-08-26 | Discharge: 2010-08-26 | Disposition: A | Discharge status: Home / Self Care | Payer: BC Managed Care – PPO | Source: Ambulatory Visit | Attending: *Deleted | Admitting: *Deleted

## 2010-08-26 DIAGNOSIS — Z1239 Encounter for other screening for malignant neoplasm of breast: Secondary | ICD-10-CM

## 2010-10-08 ENCOUNTER — Ambulatory Visit (INDEPENDENT_AMBULATORY_CARE_PROVIDER_SITE_OTHER): Payer: BC Managed Care – PPO | Admitting: Family Medicine

## 2010-10-08 ENCOUNTER — Encounter: Payer: Self-pay | Admitting: Family Medicine

## 2010-10-08 VITALS — BP 122/86 | HR 80 | Temp 98.7°F | Wt 186.0 lb

## 2010-10-08 DIAGNOSIS — H109 Unspecified conjunctivitis: Secondary | ICD-10-CM | POA: Insufficient documentation

## 2010-10-08 LAB — CBC
MCHC: 34 g/dL (ref 30.0–36.0)
MCV: 86.1 fL (ref 78.0–100.0)
Platelets: 269 10*3/uL (ref 150–400)
WBC: 13.9 10*3/uL — ABNORMAL HIGH (ref 4.0–10.5)

## 2010-10-08 LAB — DIFFERENTIAL
Basophils Relative: 2 % — ABNORMAL HIGH (ref 0–1)
Eosinophils Absolute: 0 10*3/uL (ref 0.0–0.7)
Lymphs Abs: 1.2 10*3/uL (ref 0.7–4.0)
Neutrophils Relative %: 85 % — ABNORMAL HIGH (ref 43–77)

## 2010-10-08 LAB — BASIC METABOLIC PANEL
BUN: 13 mg/dL (ref 6–23)
Calcium: 9.2 mg/dL (ref 8.4–10.5)
Chloride: 107 mEq/L (ref 96–112)
Creatinine, Ser: 0.67 mg/dL (ref 0.4–1.2)

## 2010-10-08 MED ORDER — POLYMYXIN B-TRIMETHOPRIM 10000-0.1 UNIT/ML-% OP SOLN: 1.0000 [drp] | OPHTHALMIC | Status: AC

## 2010-10-08 NOTE — Patient Instructions (Signed)
Start the antibiotic drops today.  This should gradually improve.  If you have progressive symptoms, then notify the clinic.  Take care.

## 2010-10-08 NOTE — Progress Notes (Signed)
Inc in allergy symptoms over the last few weeks. Last night the R upper eyelid was puffy, tender (some dec in tenderness today).  She used allergy eyedrops and a cool rag on her eyelid with some relief.  Woke up this AM with eye crusted shut.  No contacts and no glasses.  No FB known.   No FCNAV.  No other new complaints.    No symptom on L eye.  R eye vision is blurry per patient as of last night.    Meds, vitals, and allergies reviewed.   ROS: See HPI.  Otherwise, noncontributory.  ncat except for some mild r upper and lower lid edema Tm wnl x2 Nasal and oral exam wnl Neck w/o la Perrl, eomi, fundus wnl x2 L conjunctiva wnl R conjunctive injected, decreased at limbus  No FB seen after thorough exam Rapid dec in discomfort after tetracaine applied No abnormal stain uptake on exam

## 2010-10-08 NOTE — Assessment & Plan Note (Addendum)
Start abx drops and fu prn.  Reassuring exam. D/w pt.  No FB and no abrasion seen.  She agrees.

## 2010-11-05 NOTE — Assessment & Plan Note (Signed)
[facility]                         GASTROENTEROLOGY OFFICE NOTE   KASSI, ESTEVE                       MRN:          [id number]  DATE:09/13/2007                            DOB:          10-10-1980    CHIEF COMPLAINT:  Followup after colonoscopy.   She had terminal ileal ulcers at her colonoscopy which showed an active  and chronic ileitis. They were tiny. I explained to her the differential  could include NSAIDs which she was on them until a week prior and  Crohn's disease. Levbid was prescribed which helped the pain but is  persistent. A new symptom she is describing is fairly intense nausea  after she eats and vomiting after eating on many occasions. The pain on  the right side persists. She is not describing bowel movement changes  but she still has a lot of gas and bloating. She remains on Prilosec.  She had labs at [employer] through occupational health. These are seen  and reviewed and are dated September 10, 2007. Her CBC  was normal with  hemoglobin 13.7, hematocrit 40, white count 6,6, platelets 268. Her TSH  was normal at 1.268. Her LDL cholesterol was 145, total 209,  triglycerides 65, HDL 51. The ratio was 4.1. Her CMET was normal though  her transaminases with the ALT were slightly high at 46 with top normal  40, the other LFTs were normal. She had a hepatitis panel with hepatitis  A IgM antibody, hepatitis B surface antigen, B core antibody, IgM and C  virus antibody all negative. I reviewed these results with her and  explained the meaning. She still had left hip pain much more so than  right hip pain. She says her anxiety is much less than it was a year ago  and she is using the Lorazepam and the Lamictal intermittently. Dr.  Alphonsus Sias made those diagnoses apparently. She has used Dramamine for  nausea but it makes her very sleepy. She has never tried Phenergan.   PHYSICAL EXAMINATION:  Weight 176 pounds, pulse 84, blood pressure  120/76.   ASSESSMENT:  1. Ulceration of the small intestine, etiology not clear. Crohn's      disease and NSAIDs are the leading candidates. It could just be      nonspecific. They were very tiny and it is hard to say at this      point.  2. Chronic left hip pain, some right hip pain and low back pain I      think at times as well.  3. Chronic right-sided abdominal pain.  4. Mild elevation transaminases, unclear etiology, unlikely to be a      serious problem. Question related to previous NSAIDs.  5. History of psoriasis and auto immune problem as well as family      history of rheumatoid arthritis in her mother.  6. Post prandial nausea and vomiting, etiology not entirely clear at      this time.   RECOMMENDATIONS AND PLAN:  This is a bit difficult to figure out as to  what the exact etiology is. There certainly is a  flavor for a  gastrointestinal functional syndrome. However, the psoriasis history,  rheumatoid arthritis family history, the ulcers all raise the  possibility of Crohn's disease. She could have gallstones causing the  nausea and vomiting after eating. There is some chronic right-sided pain  as well.   PLAN:  1. Abdominal ultrasound to evaluate the nausea, vomiting, right-sided      abdominal pain and the mild increase in ALT.  2. Left and right hip films and pelvic films, question sacroiliitis or      other problems that could be a clue to Crohn's disease.  3. We discussed the possibility of inflammatory bowel disease profile      testing and she wants to hold off on this as it is not clear as to      how much coverage she would have for that. Will give her the name      of the test and she can check with her insurance company to see.   I will contact her pending the results of these studies. An EGD could be  indicated depending upon the course of the vomiting problems as well. We  did not prescribe promethazine as she seems to be tolerating things  despite the  symptoms and she becomes very sleepy with Dramamine.     [name], MD,FACG  Electronically Signed    CEG/MedQ  DD: 09/13/2007  DT: 09/13/2007  Job #: [id number]   cc:   Karie Schwalbe, MD

## 2010-11-05 NOTE — Op Note (Signed)
Bridget Maldonado, Bridget Maldonado                ACCOUNT NO.:  [id number]   MEDICAL RECORD NO.:  [id number]          PATIENT TYPE:  AMB   LOCATION:  SDS                          FACILITY:  [facility]   PHYSICIAN:  Cherylynn Ridges, M.D.    DATE OF BIRTH:  1980/10/27   DATE OF PROCEDURE:  12/02/2007  DATE OF DISCHARGE:  12/02/2007                               OPERATIVE REPORT   PREOPERATIVE DIAGNOSIS:  Symptomatic biliary dyskinesia.   POSTOPERATIVE DIAGNOSIS:  Symptomatic biliary dyskinesia with chronic  cholecystitis.   PROCEDURE:  Laparoscopic cholecystectomy with cholangiogram.   SURGEON:  Marta Lamas. Lindie Spruce, MD   ANESTHESIA:  General endotracheal.   ESTIMATED BLOOD LOSS:  Less than 20 mL.   COMPLICATIONS:  None.   CONDITION:  Stable.   FINDINGS:  Some omental adhesions to the gallbladder wall.  Normal  intraoperative cholangiogram.   INDICATIONS FOR OPERATION:  The patient is a 30 year old with  symptomatic biliary dyskinesia, comes in now for laparoscopic  cholecystectomy.   OPERATION:  The patient was taken to the operating room, placed on table  in the supine position.  After an adequate general endotracheal  anesthetic was administered, she was prepped and draped in usual sterile  manner exposing the midline and right upper quadrant.   A supraumbilical curvilinear incision was made using #11 blade and taken  down to the midline fascia.  Kocher clamps were used to grab the fascia  and then we incised between the Kocher clamps into the preperitoneal  space with Kelly clamp.  We bluntly dissected down into the peritoneal  cavity using a Kelly clamp and once we entered the peritoneal cavity, a  pursestring suture of 0 Vicryl was passed around the fascial opening.  A  Hasson cannula was then passed and secured in place with a pursestring  suture.   Carbon dioxide insufflation was instilled through the Hasson cannula  into the peritoneal cavity up to a maximal intra-abdominal pressure of  15 mmHg.  Once this was done, 2 right-sided 5-mm cannulas and a  subxiphoid 11-mm cannula were passed under direct vision.  The head was  tilted up, the left side tilted down, and dissection begun.   We grabbed the dome of the gallbladder and retracted towards the  anterior abdominal wall where we noted there was some filmy omental  adhesions to the gallbladder body.  These were dissected free down to  the infundibulum where we grabbed with a second grasper and retracted  laterally in order to expose the peritoneum overlying the triangle of  Calot and hepatoduodenal triangle.  It was at this level that the cystic  duct and cystic artery were dissected free.  A clip was placed along the  gallbladder side of the cystic duct, and a cholecystodochotomy made  using laparoscopic scissors.  A Cook catheter was passed through the  cholecystodochotomy in which a cholangiogram was performed demonstrating  good flow into the duodenum, good proximal filling.  No intraductal  filling defects.  No evidence of dilatation.   Once this was completed, we removed the clip and secured the distal  cystic duct with a EndoClip x4.  We transected the cystic duct and then  dissected out the cystic artery, which was clipped proximally and  distally x2.  We then dissected out the gallbladder from its bed with  minimal difficulty, retrieving it from the subxiphoid site using an  EndoCatch bag.  We irrigated with about 1 L of saline solution and  aspirated all fluid and gas from above the liver as removed all  cannulas.   The supraumbilical fascia site was closed using a pursestring suture,  which was in place.  We then injected 0.25% Marcaine with epinephrine at  all sites.  We closed the supraumbilical and subxiphoid skin sites using  running subcuticular stitch of 4-0 Vicryl.  We then applied Dermabond,  Steri-Strips, and Tegaderm to all wounds, then closed the lateral  cannula sites with Dermabond primarily.   All counts were correct  including needles, sponges, and instruments.      Cherylynn Ridges, M.D.  Electronically Signed     JOW/MEDQ  D:  12/02/2007  T:  12/03/2007  Job:  [id number]

## 2010-11-05 NOTE — Assessment & Plan Note (Signed)
[facility]                         GASTROENTEROLOGY OFFICE NOTE   Bridget Maldonado, Bridget Maldonado                       MRN:          [id number]  DATE:08/16/2007                            DOB:          10-Nov-1980    REFERRING PHYSICIAN:  Karie Schwalbe, MD   CHIEF COMPLAINT:  Right sided abdominal pain, rectal bleeding.   HISTORY OF PRESENT ILLNESS:  A 30 year old white woman with some chronic  right sided lower quadrant and flank pain, as well as intermittent  rectal bleeding in the last several months. Etiology is not entirely  clear. She also had epigastric burning but was on NSAIDS and is much  better on a PPI with respect to that but does have the persistent right  sided pain as well as rectal bleeding of and on.   She had some mildly elevated transaminases as well. I think these could  have been due to her Advil usage.   RECOMMENDATIONS/PLAN:  1. Dr. Alphonsus Sias has raised the question of Crohn's disease. She has gas      and bloating as well. She has some nausea at time. She has improved      with a proton pump inhibitor and I think that may be an improvement      of an NSAID  related gastritis or perhaps even ulcer. The other      symptoms are not entirely clear. She certainly could have irritable      bowel syndrome with rectal bleeding. However, the possibility of      Crohn's disease is real and I think the best way to try to answer      these questions and rule out any inflammatory bowel disease would      be with a full colonoscopy, to be able to look at the right colon      as well as terminal ileum. She has psoriasis. Her mother has      rheumatoid arthritis, which indicters the possibility of an      increased risk of other autoimmune disorders.  2. I agree with the PPI, as well as minimizing, if not stopping,      NSAIDS.   I have explained the risks, benefits, and indications of colonoscopy and  she understands and agrees to proceed.   HISTORY:  As above. See my medical history for further details.   PAST MEDICAL HISTORY:  Bipolar disorder, allergies, chronic headaches,  left hip pain of unclear etiology, and psoriasis.   MEDICATIONS:  Are listed and reviewed in the chart and they include  Zyrtec, Lamictal, Lorazepam, Mometasone, Furo-8 cream, as well as  Prilosec 20 mg daily.   ALLERGIES:  KETEK, PROZAC, EFFEXOR.   FAMILY HISTORY:  Rheumatoid arthritis and breast cancer in her mother.   SOCIAL HISTORY:  She is single. She is working at [employer]. No  alcohol, tobacco, or drugs.   REVIEW OF SYSTEMS:  See medical history for full details of that.   PHYSICAL EXAMINATION:  GENERAL:  An overweight, pleasant, white woman.  VITAL SIGNS:  Height 5 foot 2. Weight 177 pounds. Blood pressure  132/80,  pulse 80. She is really overweight to obese.  HEENT:  Eyes anicteric. Normal lips, nose, ears.  NECK:  Supple. No thyromegaly or mass.  CHEST:  Clear.  HEART:  S1 and S2. No murmur, rub, or gallop.  ABDOMEN:  She has some mild tenderness in the right lower quadrant  without mass effect. There is no other organomegaly.  RECTAL:  Examination deferred.  LYMPHATIC:  No neck or supraclavicular nodes.  EXTREMITIES:  No edema.  SKIN:  Warm and dry. No acute rashes.  NEUROLOGIC:  Alert and oriented times three. Cranial nerves 2-12 are  intact   I appreciate the opportunity to care for this patient. I have reviewed  the records and the electronic medical record from Dr. Karle Starch office  as well.   C.Diff was normal.   May need to followup on the transaminases at some point. Perhaps,  Lamictal has something to do with those as well.     [name], MD,FACG  Electronically Signed    CEG/MedQ  DD: 08/16/2007  DT: 08/17/2007  Job #: [id number]   cc:   Karie Schwalbe, MD

## 2010-11-08 NOTE — Assessment & Plan Note (Signed)
[facility]                                 ON-CALL NOTE   ANUPAMA, PIEHL                         MRN:          [id number]  DATE:01/09/2007                            DOB:          [birth year]    Date of Birth [birth year].  Phone number [phone] and [phone], this is a  [facility] patient.  Patient is generally well.  However, she has had sores  in her mouth for the last 2 days, had a couple behind her lip and now a  number under her tongue.  There is no associated fever, has not been on  antibiotics and has no chronic diseases.  She just bought some over-the-  counter topical which is giving her some temporary relief.  I have  discussed with her options peroxyl rinses and be seen in the office on  Monday.  If she gets high fever, is unable to keep food down or other  significant symptoms, she should call back in the meantime.     Neta Mends. Fabian Sharp, MD  Electronically Signed    WKP/MedQ  DD: 01/09/2007  DT: 01/09/2007  Job #: [id number]   cc:   Karie Schwalbe, MD

## 2010-11-08 NOTE — Assessment & Plan Note (Signed)
[facility]                                   ON-CALL NOTE   Bridget Maldonado, Bridget Maldonado                       MRN:          [id number]  DATE:03/24/2006                            DOB:          September 07, 1980    Call from [phone] at 7:07 p.m.  The patient was seen by Dr. Alphonsus Sias  yesterday, and given Ceftin and ear drops for an ear infection and sore  throat.  The patient has since then developed high fever, even with Advil  and Tylenol alternating, and increased pain in the throat, and neck pain and  back pain as well.  Her mother states she has been unable to get any relief  with Advil, Tylenol or lukewarm baths or showers.  I recommended she take  the patient to an urgent care or the emergency room to be reevaluated.       Lelon Perla, DO      YRL/MedQ  DD:  03/24/2006  DT:  03/26/2006  Job #:  [id number]   cc:   Karie Schwalbe, MD

## 2011-03-20 LAB — CBC
RBC: 4.81
WBC: 5.8

## 2011-03-20 LAB — DIFFERENTIAL
Lymphocytes Relative: 29
Lymphs Abs: 1.7
Monocytes Relative: 6
Neutro Abs: 3.6
Neutrophils Relative %: 63

## 2011-03-20 LAB — COMPREHENSIVE METABOLIC PANEL
ALT: 16
Alkaline Phosphatase: 93
CO2: 24
GFR calc non Af Amer: >60
Glucose, Bld: 86
Potassium: 4.3
Sodium: 139
Total Protein: 6.7

## 2011-04-14 ENCOUNTER — Encounter: Payer: Self-pay | Admitting: Internal Medicine

## 2011-04-14 ENCOUNTER — Ambulatory Visit (INDEPENDENT_AMBULATORY_CARE_PROVIDER_SITE_OTHER): Payer: BC Managed Care – PPO | Admitting: Internal Medicine

## 2011-04-14 DIAGNOSIS — F411 Generalized anxiety disorder: Secondary | ICD-10-CM

## 2011-04-14 DIAGNOSIS — R002 Palpitations: Secondary | ICD-10-CM | POA: Insufficient documentation

## 2011-04-14 MED ORDER — TRAZODONE HCL 50 MG PO TABS: 50.0000 mg | ORAL_TABLET | Freq: Every day | ORAL | Status: DC

## 2011-04-14 NOTE — Assessment & Plan Note (Signed)
Has mood lability May have bipolar variant Not depressed May need to consider mood stabilizer  Wants to try something for sleep Will try trazodone

## 2011-04-14 NOTE — Progress Notes (Signed)
Subjective:    Patient ID: Bridget Maldonado, female    DOB: 01-21-1981, 30 y.o.   MRN: [id number]  HPI Had been doing fine till recently Has noted her heart rate being up  Seen by PA at work at [employer] Notes HR varies from 70 to over 100 Can be erratic  Feels exhaused--"I can't get enough rest" Dizziness at times Has occ spells of "heart beating out of my chest" for seconds to perhaps 1 minute Hasn't been caught by medical staff at work Some sensation of SOB--may be related to anxiety from the sensation Has had some chest pain at times  EKG 2+ months ago was normal ---reviewed  No edema Some scattered nausea over the past week---not necessarily associated with the spells  Caffeine free drinks---only rarely has caffeine No energy drinks  Doesn't feel stressed Not depressed  Hasn't needed her albuterol in a couple of months  Current Outpatient Prescriptions on File Prior to Visit  Medication Sig Dispense Refill  . albuterol (PROVENTIL HFA;VENTOLIN HFA) 108 (90 BASE) MCG/ACT inhaler Inhale 2 puffs into the lungs every 4 (four) hours as needed.        . Cetirizine HCl (ZYRTEC ALLERGY) 10 MG CAPS Take 1 capsule by mouth daily.        . mometasone (ELOCON) 0.1 % ointment Apply two times daily as needed for psoriasis.       Lorita Officer Triphasic (ORTHO TRI-CYCLEN LO) 0.18/0.215/0.25 MG-25 MCG TABS Take 1 tablet by mouth daily.          Allergies  Allergen Reactions  . Fluoxetine Hcl   . Telithromycin   . Tramadol Hcl     REACTION: vomiting    Past Medical History  Diagnosis Date  . 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 Chronic headaches   . Psoriasis   . Obese     Past Surgical History  Procedure Date  . US gallbladder 9/02    Negative  . Laparoscopic cholecystectomy 6/09    Dr. Lindie Spruce    Family History  Problem Relation Age of Onset  . Cancer Mother     Breast CA  . Arthritis Mother   . Asthma Father   . Anxiety disorder Father    Severe, possible bipolar  . Diabetes Other   . Cancer Other     Colon    History   Social History  . Marital Status: Single    Spouse Name: N/A    Number of Children: N/A  . Years of Education: N/A   Occupational History  . Naval architect for [employer]    Social History Main Topics  . Smoking status: Never Smoker   . Smokeless tobacco: Never Used  . Alcohol Use: No  . Drug Use: Not on file  . Sexually Active: Not on file   Other Topics Concern  . Not on file   Social History Narrative   Lives with parents.   Review of Systems Weight is stable Sleep is not good--has been using melatonin but stopped it again (has trouble with maintaining sleep) occ awakens with the pounding    Objective:   Physical Exam  Constitutional: She appears well-developed and well-nourished. No distress.  Neck: Normal range of motion. Neck supple. No JVD present. No thyromegaly present.  Cardiovascular: Normal rate, regular rhythm and normal heart sounds.  Exam reveals no gallop and no friction rub.   No murmur heard. Pulmonary/Chest: Effort normal and breath sounds normal. No  respiratory distress. She has no wheezes. She has no rales.  Abdominal: Soft. There is no tenderness.  Musculoskeletal: Normal range of motion. She exhibits no edema and no tenderness.  Lymphadenopathy:    She has no cervical adenopathy.  Psychiatric: She has a normal mood and affect. Her behavior is normal. Judgment and thought content normal.          Assessment & Plan:

## 2011-04-14 NOTE — Assessment & Plan Note (Signed)
Has been going on for several months Not due to asthma or albuterol since no problems with this Very little caffeine Does note intermittent anxiety----?related to her symptoms Does get angry at times  EKG normal in August at work and recent labs all benign (scanned) Doubt SVT but low possiblity---discussed that this is benign for most people Most likely not SVT--could be related to quick anger, anxiety

## 2011-05-26 ENCOUNTER — Ambulatory Visit (INDEPENDENT_AMBULATORY_CARE_PROVIDER_SITE_OTHER): Payer: BC Managed Care – PPO | Admitting: Family Medicine

## 2011-05-26 ENCOUNTER — Encounter: Payer: Self-pay | Admitting: Family Medicine

## 2011-05-26 DIAGNOSIS — J45909 Unspecified asthma, uncomplicated: Secondary | ICD-10-CM

## 2011-05-26 DIAGNOSIS — J4530 Mild persistent asthma, uncomplicated: Secondary | ICD-10-CM | POA: Insufficient documentation

## 2011-05-26 MED ORDER — CHLORPHENIRAMINE-HYDROCODONE 8-10 MG/5ML PO LQCR: 5.0000 mL | Freq: Two times a day (BID) | ORAL | Status: DC | PRN

## 2011-05-26 NOTE — Progress Notes (Signed)
30 yo with h/o asthma comes in ?bronchitis.     Has had wheezing and productive cough x 2 week. Has been using her inhaler daily with relief of some symptoms.  Saw a PA at work and was given [employer] and a Zpack.  Wheezing has improved but having terrible coughing spells at night, not alleviated by Tessalon. Did not use inhaler yet this morning so wheezing more now that she has been.   Patient Active Problem List  Diagnoses  . ANXIETY  . Palpitations  . Asthma   Past Medical History  Diagnosis Date  . 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 Chronic headaches   . Psoriasis   . Obese    Past Surgical History  Procedure Date  . US gallbladder 9/02    Negative  . Laparoscopic cholecystectomy 6/09    Dr. Lindie Spruce   History  Substance Use Topics  . Smoking status: Never Smoker   . Smokeless tobacco: Never Used  . Alcohol Use: No   Family History  Problem Relation Age of Onset  . Cancer Mother     Breast CA  . Arthritis Mother   . Asthma Father   . Anxiety disorder Father     Severe, possible bipolar  . Diabetes Other   . Cancer Other     Colon   Allergies  Allergen Reactions  . Fluoxetine Hcl   . Telithromycin   . Tramadol Hcl     REACTION: vomiting   Current Outpatient Prescriptions on File Prior to Visit  Medication Sig Dispense Refill  . albuterol (PROVENTIL HFA;VENTOLIN HFA) 108 (90 BASE) MCG/ACT inhaler Inhale 2 puffs into the lungs every 4 (four) hours as needed.        . mometasone (ELOCON) 0.1 % ointment Apply two times daily as needed for psoriasis.       Lorita Officer Triphasic (ORTHO TRI-CYCLEN LO) 0.18/0.215/0.25 MG-25 MCG TABS Take 1 tablet by mouth daily.         The PMH, PSH, Social History, Family History, Medications, and allergies have been reviewed in [facility], and have been updated if relevant.   Review of Systems  General:  Complains of chills and malaise; denies fever. ENT:  Complains of nasal congestion; denies sore  throat. CV:  Denies chest pain or discomfort. Resp:  Complains of and wheezing; denies shortness of breath.  Physical Exam BP 132/100  Pulse 104  Temp(Src) 97.7 F (36.5 C) (Oral)  Ht 5\' 3"  (1.6 m)  Wt 181 lb 8 oz (82.328 kg)  BMI 32.15 kg/m2  LMP 05/19/2011  General:  Well-developed,well-nourished,in no acute distress; alert,appropriate and cooperative throughout examination Ears:  External ear exam shows no significant lesions or deformities.  Otoscopic examination reveals clear canals, tympanic membranes are intact bilaterally without bulging, retraction, inflammation or discharge. Hearing is grossly normal bilaterally. Nose:  mild erythema Mouth:  no deformity or lesions Lungs:  occasinal exp wheezes, no increased WOB, no crackles Heart:    Tachycardic but regular rate after coughing spell subsides. Extremities:  No clubbing, cyanosis, edema, or deformity noted with normal full range of motion of all joints.    Assessment and Plan: 1. Asthma    Deteriorated but does not seem to be infectious at this point s/p zpack. Wheezing is actually quite minimal and breath sounds are good. Will give rx for tussionex to help with cough at night. See pt instructions for details.

## 2011-05-26 NOTE — Patient Instructions (Signed)
Good to see you. Continue using your inhaler as needed, tessalon during the day and Tussionex at night. Call us later in the week with an update of your symptoms.

## 2011-06-03 ENCOUNTER — Telehealth: Payer: Self-pay | Admitting: Internal Medicine

## 2011-06-03 NOTE — Telephone Encounter (Signed)
Patient called and stated she fell this morning and injured her ankle and is swollen on both sides.  Advised to go to urgent care per/Dee. No appointments available today.

## 2011-06-25 ENCOUNTER — Encounter: Payer: Self-pay | Admitting: Family Medicine

## 2011-06-25 ENCOUNTER — Ambulatory Visit (INDEPENDENT_AMBULATORY_CARE_PROVIDER_SITE_OTHER)
Admission: RE | Admit: 2011-06-25 | Discharge: 2011-06-25 | Disposition: A | Discharge status: Home / Self Care | Payer: BC Managed Care – PPO | Source: Ambulatory Visit | Attending: Family Medicine | Admitting: Family Medicine

## 2011-06-25 ENCOUNTER — Ambulatory Visit (INDEPENDENT_AMBULATORY_CARE_PROVIDER_SITE_OTHER): Payer: BC Managed Care – PPO | Admitting: Family Medicine

## 2011-06-25 VITALS — BP 130/84 | HR 88 | Temp 98.3°F | Ht 63.0 in | Wt 184.2 lb

## 2011-06-25 DIAGNOSIS — M549 Dorsalgia, unspecified: Secondary | ICD-10-CM | POA: Insufficient documentation

## 2011-06-25 MED ORDER — MELOXICAM 15 MG PO TABS: 15.0000 mg | ORAL_TABLET | Freq: Every day | ORAL | Status: DC

## 2011-06-25 NOTE — Assessment & Plan Note (Signed)
After a fall with TS tenderness No hx of OP or risk factors for it - but given area of tenderness will do xray today to r/o any sore of comp fx More likely muscle strain Will change from ibuprofen to mobic and update when we get results  Pt will get help at work with heavy lifting Disc symptomatic care - see instructions on AVS

## 2011-06-25 NOTE — Progress Notes (Signed)
Subjective:    Patient ID: Bridget Maldonado, female    DOB: Apr 10, 1981, 31 y.o.   MRN: [id number]  HPI Saturday - had a fall - tripped from gravel to grass in the rain  [place] flat on her buttocks Heard and felt a crack/ pop in back   (feels most in middle) Dad helped her up  Took ibuprofen that day - back is tender  Still not better - hurts to bend or twist , hurts in all positions even lying down Cannot get comfortable  Works at [employer] -- and has to push and pull   Is back to work - squeaking by  Is having someone help her with the carts   No urinary symptoms  Patient Active Problem List  Diagnoses  . ANXIETY  . Palpitations  . Asthma   Past Medical History  Diagnosis Date  . 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 Chronic headaches   . Psoriasis   . Obese    Past Surgical History  Procedure Date  . US gallbladder 9/02    Negative  . Laparoscopic cholecystectomy 6/09    Dr. Lindie Spruce   History  Substance Use Topics  . Smoking status: Never Smoker   . Smokeless tobacco: Never Used  . Alcohol Use: No   Family History  Problem Relation Age of Onset  . Cancer Mother     Breast CA  . Arthritis Mother   . Asthma Father   . Anxiety disorder Father     Severe, possible bipolar  . Diabetes Other   . Cancer Other     Colon   Allergies  Allergen Reactions  . Fluoxetine Hcl   . Telithromycin   . Tramadol Hcl     REACTION: vomiting   Current Outpatient Prescriptions on File Prior to Visit  Medication Sig Dispense Refill  . albuterol (PROVENTIL HFA;VENTOLIN HFA) 108 (90 BASE) MCG/ACT inhaler Inhale 2 puffs into the lungs every 4 (four) hours as needed.        . mometasone (ELOCON) 0.1 % ointment Apply two times daily as needed for psoriasis.       Lorita Officer Triphasic (ORTHO TRI-CYCLEN LO) 0.18/0.215/0.25 MG-25 MCG TABS Take 1 tablet by mouth daily.        . traZODone (DESYREL) 100 MG tablet Take 100 mg by mouth daily.        .  chlorpheniramine-hydrocodone (TUSSIONEX) 8-10 MG/5ML suspension Take 5 mLs by mouth every 12 (twelve) hours as needed for cough.  60 mL  0        Review of Systems Review of Systems  Constitutional: Negative for fever, appetite change, fatigue and unexpected weight change.  Eyes: Negative for pain and visual disturbance.  Respiratory: Negative for cough and shortness of breath.   Cardiovascular: Negative for cp or palpitations    Gastrointestinal: Negative for nausea, diarrhea and constipation.  Genitourinary: Negative for urgency and frequency.  Skin: Negative for pallor or rash   MSK pos for TS pain, no joint swelling or rash  Neurological: Negative for weakness, light-headedness, numbness and headaches.  Hematological: Negative for adenopathy. Does not bruise/bleed easily.  Psychiatric/Behavioral: Negative for dysphoric mood. The patient is not nervous/anxious.          Objective:   Physical Exam  Constitutional: She appears well-developed and well-nourished. No distress.       overwt and well appearing   HENT:  Head: Normocephalic and atraumatic.  Eyes: Conjunctivae and EOM  are normal. Pupils are equal, round, and reactive to light.  Neck: Normal range of motion. Neck supple.       No bony tenderness  Nl rom   Cardiovascular: Normal rate, regular rhythm, normal heart sounds and intact distal pulses.   Pulmonary/Chest: Effort normal and breath sounds normal.  Abdominal: Soft. There is no tenderness.  Musculoskeletal: She exhibits tenderness. She exhibits no edema.       TS diffusely tender No scoliosis/ skin change/ edema or heat  Some tenderness in R sided musculature also Flex 30 deg Ext 10 deg  L lateral bend very painful, R lat bend full and ok Nl gait   Lymphadenopathy:    She has no cervical adenopathy.  Neurological: She is alert. She has normal strength and normal reflexes. She displays no atrophy. No sensory deficit. She exhibits normal muscle tone.  Coordination normal.  Skin: Skin is warm and dry. No rash noted. No erythema. No pallor.  Psychiatric: She has a normal mood and affect.          Assessment & Plan:

## 2011-06-25 NOTE — Patient Instructions (Signed)
Stop ibuprofen and try meloxicam once daily with food Continue heat 10 minutes at a time  Continue gently moving  Xray today and I will update you

## 2011-07-16 ENCOUNTER — Ambulatory Visit: Payer: BC Managed Care – PPO | Admitting: Internal Medicine

## 2011-07-16 DIAGNOSIS — Z0289 Encounter for other administrative examinations: Secondary | ICD-10-CM

## 2011-07-21 ENCOUNTER — Ambulatory Visit (INDEPENDENT_AMBULATORY_CARE_PROVIDER_SITE_OTHER): Payer: BC Managed Care – PPO | Admitting: Family Medicine

## 2011-07-21 ENCOUNTER — Encounter: Payer: Self-pay | Admitting: Family Medicine

## 2011-07-21 VITALS — BP 130/86 | HR 88 | Temp 98.5°F | Ht 63.0 in | Wt 183.5 lb

## 2011-07-21 DIAGNOSIS — J101 Influenza due to other identified influenza virus with other respiratory manifestations: Secondary | ICD-10-CM | POA: Insufficient documentation

## 2011-07-21 DIAGNOSIS — H6691 Otitis media, unspecified, right ear: Secondary | ICD-10-CM

## 2011-07-21 DIAGNOSIS — J111 Influenza due to unidentified influenza virus with other respiratory manifestations: Secondary | ICD-10-CM

## 2011-07-21 DIAGNOSIS — H6690 Otitis media, unspecified, unspecified ear: Secondary | ICD-10-CM

## 2011-07-21 DIAGNOSIS — J029 Acute pharyngitis, unspecified: Secondary | ICD-10-CM

## 2011-07-21 LAB — POCT RAPID STREP A (OFFICE): Rapid Strep A Screen: NEGATIVE

## 2011-07-21 MED ORDER — OSELTAMIVIR PHOSPHATE 75 MG PO CAPS: 75.0000 mg | ORAL_CAPSULE | Freq: Two times a day (BID) | ORAL | Status: AC

## 2011-07-21 MED ORDER — AMOXICILLIN 500 MG PO CAPS: 500.0000 mg | ORAL_CAPSULE | Freq: Three times a day (TID) | ORAL | Status: AC

## 2011-07-21 MED ORDER — MELOXICAM 15 MG PO TABS: 15.0000 mg | ORAL_TABLET | Freq: Every day | ORAL | Status: DC

## 2011-07-21 NOTE — Assessment & Plan Note (Signed)
Fever blunted by mobic now  Will tx with tamiflu bid for 5 days - within 48 hour window and pt has a hx of asthma  Also tx OM right  Written out of work  Disc symptomatic care - see instructions on AVS (also given handout on flu) Update if not starting to improve in a week or if worsening

## 2011-07-21 NOTE — Progress Notes (Signed)
Subjective:    Patient ID: Bridget Maldonado, female    DOB: 1981/01/17, 31 y.o.   MRN: [id number]  HPI Here for symptoms of body aches/ sore throat and headache  Started feeling kind of under the weather on sat night -- went to bed  Got up Sunday - felt like she was hit by a bus-- runny nose/ st and headache and body aches  Had a low grade fever  (did not get a flu shot this year)  Yesterday- nausea and vomited times two   (she has a sensitive gag reflex) - no chance she is pregnant  Did drink fluids  No appetite   Today very congested  Throat is quite sore to swallow  Headache all over , neck is not stiff  Ears are bothering her a bit- feel full  Coughing a lot yesterday - more than usual / not very productive  Wheezes mostly at night   Needs refil on meloxicam for hip bursitis  Originally given for back problem Sees Dr Gavin Potters  It works better than lodine or ibuprofen   Patient Active Problem List  Diagnoses  . ANXIETY  . Palpitations  . Asthma  . Back pain, acute  . Influenza A  . Otitis media of right ear   Past Medical History  Diagnosis Date  . 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 Chronic headaches   . Psoriasis   . Obese    Past Surgical History  Procedure Date  . US gallbladder 9/02    Negative  . Laparoscopic cholecystectomy 6/09    Dr. Lindie Spruce   History  Substance Use Topics  . Smoking status: Never Smoker   . Smokeless tobacco: Never Used  . Alcohol Use: No   Family History  Problem Relation Age of Onset  . Cancer Mother     Breast CA  . Arthritis Mother   . Asthma Father   . Anxiety disorder Father     Severe, possible bipolar  . Diabetes Other   . Cancer Other     Colon   Allergies  Allergen Reactions  . Fluoxetine Hcl   . Telithromycin   . Tramadol Hcl     REACTION: vomiting   Current Outpatient Prescriptions on File Prior to Visit  Medication Sig Dispense Refill  . albuterol (PROVENTIL HFA;VENTOLIN HFA) 108 (90 BASE) MCG/ACT  inhaler Inhale 2 puffs into the lungs every 4 (four) hours as needed.        . mometasone (ELOCON) 0.1 % ointment Apply two times daily as needed for psoriasis.       Lorita Officer Triphasic (ORTHO TRI-CYCLEN LO) 0.18/0.215/0.25 MG-25 MCG TABS Take 1 tablet by mouth daily.        . traZODone (DESYREL) 100 MG tablet Take 100 mg by mouth daily.        . chlorpheniramine-hydrocodone (TUSSIONEX) 8-10 MG/5ML suspension Take 5 mLs by mouth every 12 (twelve) hours as needed for cough.  60 mL  0          Review of Systems Review of Systems  Constitutional: Negative for  and unexpected weight change. pos for fatigue/ appetite loss/ fever  Eyes: Negative for pain and visual disturbance.  ENT pos for congestion/ ear discomfort and st , neg for sinus tenderness  Respiratory: Negative for sob or wheeze  Cardiovascular: Negative for cp or palpitations    Gastrointestinal: Negative for nausea, diarrhea and constipation.  Genitourinary: Negative for urgency and frequency.  Skin: Negative for pallor or rash   MSK pos for generalized joint pain , especially hips  Neurological: Negative for weakness, light-headedness, numbness and headaches.  Hematological: Negative for adenopathy. Does not bruise/bleed easily.  Psychiatric/Behavioral: Negative for dysphoric mood. The patient is not nervous/anxious.          Objective:   Physical Exam  Constitutional: She appears well-developed and well-nourished. No distress.       overwt and fatigued appearing   HENT:  Head: Normocephalic and atraumatic.  Left Ear: External ear normal.  Mouth/Throat: Oropharynx is clear and moist.       R TM is erythematous/ bulging with eff/ and scarred  No sinus tenderness Nares are injected and congested    Eyes: Conjunctivae and EOM are normal. Pupils are equal, round, and reactive to light. Right eye exhibits no discharge. Left eye exhibits no discharge.  Neck: Normal range of motion. Neck supple.    Cardiovascular: Normal rate, regular rhythm and normal heart sounds.   Pulmonary/Chest: Effort normal and breath sounds normal. No respiratory distress. She has no wheezes. She has no rales. She exhibits no tenderness.  Abdominal: Soft. Bowel sounds are normal. She exhibits no distension and no mass. There is no tenderness.  Musculoskeletal: She exhibits tenderness. She exhibits no edema.       No acute joint changes   Lymphadenopathy:    She has no cervical adenopathy.  Neurological: She is alert. She has normal reflexes.  Skin: Skin is warm and dry. No rash noted. No erythema. No pallor.  Psychiatric: She has a normal mood and affect.          Assessment & Plan:

## 2011-07-21 NOTE — Patient Instructions (Signed)
Drink lots of fluids and get rest  Tylenol for body aches and fever  For flu - take tamiflu For ear infection on Right - take amoxicillin  Nasal saline spray/ mucinex DM are my favorite products

## 2011-07-21 NOTE — Assessment & Plan Note (Signed)
With redness/ effusion on exam as well as pain (a complication of flu) tx with amoxicillin  Disc symptomatic care - see instructions on AVS  Update if not starting to improve in a week or if worsening

## 2011-07-28 ENCOUNTER — Other Ambulatory Visit: Payer: Self-pay | Admitting: Family Medicine

## 2011-07-28 DIAGNOSIS — Z1231 Encounter for screening mammogram for malignant neoplasm of breast: Secondary | ICD-10-CM

## 2011-09-01 ENCOUNTER — Ambulatory Visit
Admission: RE | Admit: 2011-09-01 | Discharge: 2011-09-01 | Disposition: A | Discharge status: Home / Self Care | Payer: BC Managed Care – PPO | Source: Ambulatory Visit | Attending: Family Medicine | Admitting: Family Medicine

## 2011-09-01 DIAGNOSIS — Z1231 Encounter for screening mammogram for malignant neoplasm of breast: Secondary | ICD-10-CM

## 2011-09-03 ENCOUNTER — Other Ambulatory Visit: Payer: Self-pay | Admitting: Family Medicine

## 2011-09-03 DIAGNOSIS — R928 Other abnormal and inconclusive findings on diagnostic imaging of breast: Secondary | ICD-10-CM

## 2011-09-09 ENCOUNTER — Ambulatory Visit
Admission: RE | Admit: 2011-09-09 | Discharge: 2011-09-09 | Disposition: A | Discharge status: Home / Self Care | Payer: BC Managed Care – PPO | Source: Ambulatory Visit | Attending: Family Medicine | Admitting: Family Medicine

## 2011-09-09 DIAGNOSIS — R928 Other abnormal and inconclusive findings on diagnostic imaging of breast: Secondary | ICD-10-CM

## 2012-03-25 ENCOUNTER — Encounter: Payer: Self-pay | Admitting: Family Medicine

## 2012-03-25 ENCOUNTER — Ambulatory Visit (INDEPENDENT_AMBULATORY_CARE_PROVIDER_SITE_OTHER): Payer: BC Managed Care – PPO | Admitting: Family Medicine

## 2012-03-25 ENCOUNTER — Ambulatory Visit (INDEPENDENT_AMBULATORY_CARE_PROVIDER_SITE_OTHER)
Admission: RE | Admit: 2012-03-25 | Discharge: 2012-03-25 | Disposition: A | Discharge status: Home / Self Care | Payer: BC Managed Care – PPO | Source: Ambulatory Visit | Attending: Family Medicine | Admitting: Family Medicine

## 2012-03-25 ENCOUNTER — Telehealth: Payer: Self-pay | Admitting: *Deleted

## 2012-03-25 VITALS — BP 140/104 | HR 76 | Temp 98.4°F | Wt 186.0 lb

## 2012-03-25 DIAGNOSIS — R319 Hematuria, unspecified: Secondary | ICD-10-CM

## 2012-03-25 DIAGNOSIS — R109 Unspecified abdominal pain: Secondary | ICD-10-CM

## 2012-03-25 LAB — POCT URINALYSIS DIPSTICK
Ketones, UA: NEGATIVE
Leukocytes, UA: NEGATIVE
Protein, UA: NEGATIVE
pH, UA: 6

## 2012-03-25 LAB — CBC WITH DIFFERENTIAL/PLATELET
Eosinophils Relative: 2.6 % (ref 0.0–5.0)
HCT: 46.8 % — ABNORMAL HIGH (ref 36.0–46.0)
Lymphs Abs: 1.9 10*3/uL (ref 0.7–4.0)
MCV: 88.5 fl (ref 78.0–100.0)
Monocytes Absolute: 0.3 10*3/uL (ref 0.1–1.0)
Neutro Abs: 5.2 10*3/uL (ref 1.4–7.7)
Platelets: 264 10*3/uL (ref 150.0–400.0)
RDW: 12.8 % (ref 11.5–14.6)

## 2012-03-25 LAB — POCT URINE PREGNANCY: Preg Test, Ur: NEGATIVE

## 2012-03-25 LAB — COMPREHENSIVE METABOLIC PANEL
AST: 20 U/L (ref 0–37)
Albumin: 3.8 g/dL (ref 3.5–5.2)
Alkaline Phosphatase: 92 U/L (ref 39–117)
Potassium: 4.6 mEq/L (ref 3.5–5.1)
Sodium: 138 mEq/L (ref 135–145)
Total Protein: 7.6 g/dL (ref 6.0–8.3)

## 2012-03-25 MED ORDER — IOHEXOL 300 MG/ML  SOLN
100.0000 mL | Freq: Once | INTRAMUSCULAR | Status: AC | PRN
  Administered 2012-03-25: 100 mL via INTRAVENOUS

## 2012-03-25 NOTE — Telephone Encounter (Signed)
See result note.  

## 2012-03-25 NOTE — Telephone Encounter (Signed)
Rose called with patient's CT report- neg for appendicitis.  No acute findings.  Report is in Epic.

## 2012-03-25 NOTE — Patient Instructions (Addendum)
Good to see you, Bridget Maldonado. Please stop by to see Shirlee Limerick after you to the lab to set up your CT scan of your abdomen and pelvis. If your symptoms worsen over night, please go immediately to the ER. We will call you with your results.

## 2012-03-25 NOTE — Progress Notes (Addendum)
Subjective:    Patient ID: Bridget Maldonado, female    DOB: 10/12/1980, 31 y.o.   MRN: [id number]  HPI  31 yo pt of Dr. Alphonsus Sias here for abdominal pain x 3 days.  Went to PA at work who checked her BP and told her to come here immediately- BP was 140/100, similar to what it is here.  She has no h/o HTN. BP Readings from Last 3 Encounters:  03/25/12 140/104  07/21/11 130/86  06/25/11 130/84   Abdominal pain started 3 days ago- mainly left lower and left upper quadrant but does radiation to right quadrant as well.  Remote h/o cholecystectomy.  She has been nauseated. No vomiting.  No diarrhea or constipation. No fevers.  Nothing makes pain better, food does seem to make it worse. Pain is intermittent and at times it is a 10/10.  NO recent travel, she is on OCPs.  Patient Active Problem List  Diagnosis  . ANXIETY  . Palpitations  . Asthma  . Back pain, acute  . Influenza A  . Otitis media of right ear   Past Medical History  Diagnosis Date  . 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 Chronic headaches   . Psoriasis   . Obese    Past Surgical History  Procedure Date  . US gallbladder 9/02    Negative  . Laparoscopic cholecystectomy 6/09    Dr. Lindie Spruce   History  Substance Use Topics  . Smoking status: Never Smoker   . Smokeless tobacco: Never Used  . Alcohol Use: No   Family History  Problem Relation Age of Onset  . Cancer Mother     Breast CA  . Arthritis Mother   . Asthma Father   . Anxiety disorder Father     Severe, possible bipolar  . Diabetes Other   . Cancer Other     Colon   Allergies  Allergen Reactions  . Fluoxetine Hcl   . Telithromycin   . Tramadol Hcl     REACTION: vomiting   Current Outpatient Prescriptions on File Prior to Visit  Medication Sig Dispense Refill  . albuterol (PROVENTIL HFA;VENTOLIN HFA) 108 (90 BASE) MCG/ACT inhaler Inhale 2 puffs into the lungs every 4 (four) hours as needed.        . mometasone (ELOCON) 0.1 %  ointment Apply two times daily as needed for psoriasis.       Lorita Officer Triphasic (ORTHO TRI-CYCLEN LO) 0.18/0.215/0.25 MG-25 MCG TABS Take 1 tablet by mouth daily.         The PMH, PSH, Social History, Family History, Medications, and allergies have been reviewed in [facility], and have been updated if relevant.   Review of Systems See HPI No blurred vision, CP or SOB    Objective:   Physical Exam BP 140/104  Pulse 76  Temp 98.4 F (36.9 C)  Wt 186 lb (84.369 kg)  General:  Well-developed,well-nourished,in no acute distress; alert,appropriate and cooperative throughout examination Head:  normocephalic and atraumatic.   Eyes:  vision grossly intact, pupils equal, pupils round, and pupils reactive to light.   Ears:  R ear normal and L ear normal.   Nose:  no external deformity.   Mouth:  good dentition.    Abdomen:  Bowel sounds positive,abdomen soft  TPP over right and left lower quadrant, no rebound or guarding Neurologic:  alert & oriented X3 and gait normal.   Skin:  Intact without suspicious lesions or rashes Psych:  Cognition and judgment appear intact.  Mildly anxious    Assessment & Plan:   1. Abdominal  pain, other specified site  Comprehensive metabolic panel, Lipase, CBC with Differential, CT Abdomen Pelvis W Contrast   New- physical exam is concerning for intra abdominal process. She does not have an acute abdomen at this point. UA pos for trace blood but has a h/o hematuria- send for cx.   U preg neg. Will order labs and CT of abdomen and pelvis for further evaluation-? Appendicitis vs gastroenteritis. Cannot rule out nephrolithiasis although presentation is not classic for this. IBD unlikely- no changes in her bowel habits.  2.  Elevated BP- Likely secondary to pain and anxiety related to the above process. Asymptomatic.

## 2012-03-29 ENCOUNTER — Telehealth: Payer: Self-pay | Admitting: *Deleted

## 2012-03-29 NOTE — Telephone Encounter (Signed)
Yes, she's some better, doesn't have pain all the time but it is still there.

## 2012-03-29 NOTE — Telephone Encounter (Signed)
Advised patient, she will call back if symptoms get worse.

## 2012-03-29 NOTE — Telephone Encounter (Signed)
Pt states she's still having abd pain and asks if you still want her to get d dimer.  She's going to see her gyn late this month, and will discuss changing her birth control pill with him.

## 2012-03-29 NOTE — Telephone Encounter (Signed)
I'm glad it's improving.  If getting better, d dimer likely not necessary.  If symptoms deteriorated, let us know. I also am glad she is seeing her GYN later this month.

## 2012-03-29 NOTE — Telephone Encounter (Signed)
Has her pain improved at all?  Yes we can still order a d dimer.

## 2012-03-31 ENCOUNTER — Encounter: Payer: Self-pay | Admitting: Family Medicine

## 2012-03-31 ENCOUNTER — Ambulatory Visit (INDEPENDENT_AMBULATORY_CARE_PROVIDER_SITE_OTHER): Payer: BC Managed Care – PPO | Admitting: Family Medicine

## 2012-03-31 ENCOUNTER — Encounter: Payer: Self-pay | Admitting: *Deleted

## 2012-03-31 ENCOUNTER — Telehealth: Payer: Self-pay | Admitting: Internal Medicine

## 2012-03-31 ENCOUNTER — Encounter: Payer: Self-pay | Admitting: Nurse Practitioner

## 2012-03-31 ENCOUNTER — Ambulatory Visit (INDEPENDENT_AMBULATORY_CARE_PROVIDER_SITE_OTHER): Payer: BC Managed Care – PPO | Admitting: Nurse Practitioner

## 2012-03-31 VITALS — BP 144/100 | HR 76 | Temp 98.4°F | Wt 185.0 lb

## 2012-03-31 VITALS — BP 138/100 | HR 92 | Ht 63.5 in | Wt 185.0 lb

## 2012-03-31 DIAGNOSIS — R109 Unspecified abdominal pain: Secondary | ICD-10-CM

## 2012-03-31 DIAGNOSIS — R1030 Lower abdominal pain, unspecified: Secondary | ICD-10-CM

## 2012-03-31 DIAGNOSIS — R198 Other specified symptoms and signs involving the digestive system and abdomen: Secondary | ICD-10-CM

## 2012-03-31 DIAGNOSIS — R194 Change in bowel habit: Secondary | ICD-10-CM

## 2012-03-31 MED ORDER — TRAMADOL HCL 50 MG PO TABS: 50.0000 mg | ORAL_TABLET | Freq: Four times a day (QID) | ORAL | Status: DC | PRN

## 2012-03-31 MED ORDER — NA SULFATE-K SULFATE-MG SULF 17.5-3.13-1.6 GM/177ML PO SOLN: 1.0000 | Freq: Once | ORAL | Status: DC

## 2012-03-31 NOTE — Patient Instructions (Addendum)
We scheduled the Colonoscopy with Dr. Stan Head.   We sent a prescription for Ultram for pain and the Colonoscopy prep to [facility]. Stop the Ibuprofen until after the Colonoscopy.

## 2012-03-31 NOTE — Patient Instructions (Addendum)
Hang in there. Please stop by to see Bridget Maldonado on your way out to set up your GI appointment.

## 2012-03-31 NOTE — Telephone Encounter (Signed)
Patient advised of appt date and time for today

## 2012-03-31 NOTE — Progress Notes (Signed)
Subjective:    Patient ID: Bridget Maldonado, female    DOB: 04-Jan-1981, 31 y.o.   MRN: [id number]  HPI  31 yo pt of Dr. Alphonsus Sias here for worsening abdominal pain.  I saw her on 10/3 with 3 day h/o abdominal pain- mainly left lower and left upper quadrant . Pain had improved until yesterday morning.  Initially had no changes in her bowel habits but she now has severe post prandial diarrhea. She is also now nauseated with food.  Nothing makes pain better, food does seem to make it worse.  Remote h/o cholecystectomy.  No fevers.  Pain is intermittent and at times it is a 10/10.  NO recent travel, she is on OCPs.  U preg neg on 10/3 in office.  Labs and CT of abdomen and pelvis unremarkable except for slightly elevated Hemoglobin, now 14.7 (bring in labs from work).  No known family history of IBD.  Of note, had colonoscopy and endoscopy in 2009 Leone Payor) - small ulcers on colonoscopy but felt it was IBS at the time and related to gall bladder. Gall bladder was then removed.   Lab Results  Component Value Date   LIPASE 28.0 03/25/2012   Lab Results  Component Value Date   ALT 20 03/25/2012   AST 20 03/25/2012   ALKPHOS 92 03/25/2012   BILITOT 0.4 03/25/2012   Lab Results  Component Value Date   WBC 7.6 03/25/2012   HGB 15.5* 03/25/2012   HCT 46.8* 03/25/2012   MCV 88.5 03/25/2012   PLT 264.0 03/25/2012     Ct Abdomen Pelvis W Contrast  03/25/2012  *RADIOLOGY REPORT*  Clinical Data: Right lower quadrant pain and 3 days.  History cholecystectomy.  Concern for appendicitis.  CT ABDOMEN AND PELVIS WITH CONTRAST  Technique:  Multidetector CT imaging of the abdomen and pelvis was performed following the standard protocol during bolus administration of intravenous contrast.  Contrast: OMNIPAQUE IOHEXOL 300 MG/ML  SOLN  Comparison: The CT abdomen 11/03/2005  Findings: Lung bases are clear.  Pericardial fluid.  No focal hepatic lesion.  There is fatty infiltration along the  falciform ligament.  No biliary dilatation.  Post cholecystectomy. The pancreas, spleen, adrenal glands, and kidneys are normal.  The stomach, small bowel, and terminal ileum are normal.  The appendix is not identified.  There is no inflammation surrounding the cecum to suggest appendicitis.  The ascending, transverse, descending colon are normal.  Sigmoid colon rectosigmoid colon are normal.  Abdominal aorta normal caliber.  There are small fatty lymph nodes in the retroperitoneum which are not pathologic enlarged.  In the pelvis, the uterus, ovaries, and bladder are normal.  The ovaries appear relatively small for age.  No pelvic lymphadenopathy. Review of  bone windows demonstrates no aggressive osseous lesions.  IMPRESSION:  1.  Appendix is not identified however there are no secondary signs of acute appendicitis. 2.  No acute findings in the abdomen or pelvis. 3.  Cholecystectomy.   Original Report Authenticated By: Genevive Bi, M.D.      Patient Active Problem List  Diagnosis  . ANXIETY  . Palpitations  . Asthma  . Back pain, acute  . Influenza A  . Otitis media of right ear  . Abdominal  pain, other specified site   Past Medical History  Diagnosis Date  . 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 Chronic headaches   . Psoriasis   . Obese    Past Surgical History  Procedure  Date  . US gallbladder 9/02    Negative  . Laparoscopic cholecystectomy 6/09    Dr. Lindie Spruce   History  Substance Use Topics  . Smoking status: Never Smoker   . Smokeless tobacco: Never Used  . Alcohol Use: No   Family History  Problem Relation Age of Onset  . Cancer Mother     Breast CA  . Arthritis Mother   . Asthma Father   . Anxiety disorder Father     Severe, possible bipolar  . Diabetes Other   . Cancer Other     Colon   Allergies  Allergen Reactions  . Fluoxetine Hcl   . Telithromycin   . Tramadol Hcl     REACTION: vomiting   Current Outpatient Prescriptions on File Prior to Visit   Medication Sig Dispense Refill  . albuterol (PROVENTIL HFA;VENTOLIN HFA) 108 (90 BASE) MCG/ACT inhaler Inhale 2 puffs into the lungs every 4 (four) hours as needed.        . mometasone (ELOCON) 0.1 % ointment Apply two times daily as needed for psoriasis.       Lorita Officer Triphasic (ORTHO TRI-CYCLEN LO) 0.18/0.215/0.25 MG-25 MCG TABS Take 1 tablet by mouth daily.         The PMH, PSH, Social History, Family History, Medications, and allergies have been reviewed in [facility], and have been updated if relevant.   Review of Systems See HPI No blurred vision, CP or SOB    Objective:   Physical Exam LMP 03/25/2012 BP 144/100  Pulse 76  Temp 98.4 F (36.9 C)  Wt 185 lb (83.915 kg)  LMP 03/25/2012  General:  Well-developed,well-nourished,in no acute distress; alert,appropriate and cooperative throughout examination Head:  normocephalic and atraumatic.   Eyes:  vision grossly intact, pupils equal, pupils round, and pupils reactive to light.   Ears:  R ear normal and L ear normal.   Nose:  no external deformity.   Mouth:  good dentition.    Abdomen:  Bowel sounds positive,abdomen soft  TPP over right and left lower quadrant, no rebound or guarding Neurologic:  alert & oriented X3 and gait normal.   Skin:  Intact without suspicious lesions or rashes Psych:  Cognition and judgment appear intact.  Mildly anxious    Assessment & Plan:   1. Abdominal  pain, other specified site    Deteriorated, now with diarrhea. I am concerned about IBD at this point, given new development of post prandial diarrhea and mucous in stool. Refer to GI for urgent evaluation/repeat colonoscopy. The patient indicates understanding of these issues and agrees with the plan.   Marland Kitchen

## 2012-04-01 ENCOUNTER — Encounter: Payer: Self-pay | Admitting: Nurse Practitioner

## 2012-04-01 NOTE — Progress Notes (Signed)
04/01/2012 Bridget Maldonado [id number] Apr 13, 1981   HISTORY OF PRESENT ILLNESS: Patient is a 31 year old female evaluated by Dr. Leone Payor in 2009. Patient was being worked up for possible inflammatory bowel disease. She had terminal ileal ulcers on colonoscopy. 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 Findings may have been secondary to NSAIDs for Crohn's disease was not ruled out. Around that time, patient had an EGD for evaluation of nausea and vomiting. EGD was normal. Other investigations included an ultrasound of the abdomen (normal) and a HIDA scan which was compatible with biliary dyskinesia. Patient ultimately had a cholecystectomy. Her symptoms got better to some degree but then over the last 2 weeks she has developed loose stool and RLQ pain. She is having some nocturnal diarrhea. Lower abdominal pain improved but not resolved with defecation. Her weight is down 5 pounds in a week. Her PCP obtained a CT scan of the abdomen and pelvis with contrast. There were no acute findings. The appendix was not seen. Recent labs reveal a normal white count, normal hemoglobin, normal erythrocyte sedimentation rate, normal lipase and normal LFTs.  Patient gives a history of chronic hip pain for which she takes 2 ibuprofen a day. She also has a history of psoriasis.  Past Medical History  Diagnosis Date  . 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 Chronic headaches   . Psoriasis   . Obese   . 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 Hemorrhoids 08/2007  . IBS (irritable bowel syndrome) 08/2007  . Bipolar disorder   . Multiple allergies   . Asthma    Past Surgical History  Procedure Date  . Laparoscopic cholecystectomy 6/09    Dr. Lindie Spruce  . Tympanostomy tube placement   . Ear tube removal     reports that she has never smoked. She has never used smokeless tobacco. She reports that she does not use illicit drugs. Her alcohol history not on file. family history includes Anxiety disorder in her  father; Asthma in her father; Breast cancer in her mother; Colon polyps in her maternal grandfather; Irritable bowel syndrome in her maternal grandmother and mother; and Rheum arthritis in her mother. Allergies  Allergen Reactions  . Effexor (Venlafaxine Hcl)   . Fluoxetine Hcl   . Telithromycin   . Tramadol Hcl     REACTION: vomiting      Outpatient Encounter Prescriptions as of 03/31/2012  Medication Sig Dispense Refill  . albuterol (PROVENTIL HFA;VENTOLIN HFA) 108 (90 BASE) MCG/ACT inhaler Inhale 2 puffs into the lungs every 4 (four) hours as needed.        . cetirizine (ZYRTEC) 10 MG tablet Take 10 mg by mouth daily.      Marland Kitchen ibuprofen (ADVIL,MOTRIN) 800 MG tablet Take 800 mg by mouth daily as needed.      . mometasone (ELOCON) 0.1 % ointment Apply two times daily as needed for psoriasis.       Lorita Officer Triphasic (ORTHO TRI-CYCLEN LO) 0.18/0.215/0.25 MG-25 MCG TABS Take 1 tablet by mouth daily.        . Na Sulfate-K Sulfate-Mg Sulf SOLN Take 1 kit by mouth once.  354 mL  0  . traMADol (ULTRAM) 50 MG tablet Take 1 tablet (50 mg total) by mouth every 6 (six) hours as needed for pain.  30 tablet  0     REVIEW OF SYSTEMS  : Positive for allergies, back pain, cough, fatigue, headaches, muscle spasms, skin rash with psoriasis. All other systems reviewed and  negative except where noted in the History of Present Illness.   PHYSICAL EXAM: BP 138/100  Pulse 92  Ht 5' 3.5" (1.613 m)  Wt 185 lb (83.915 kg)  BMI 32.26 kg/m2  LMP 03/25/2012 General: Well developed white female in no acute distress Head: Normocephalic and atraumatic Eyes:  sclerae anicteric,conjunctive pink. Ears: Normal auditory acuity Neck: Supple, no masses.  Lungs: Clear throughout to auscultation Heart: Regular rate and rhythm Abdomen: Soft, non distended, moderate RLQ tenderness. No masses or hepatomegaly noted. Normal bowel sounds Musculoskeletal: Symmetrical with no gross deformities  Skin: No  lesions on visible extremities Extremities: No edema  Neurological: Alert oriented x 4, grossly nonfocal Cervical Nodes:  No significant cervical adenopathy Psychological:  Alert and cooperative. Normal mood and affect  ASSESSMENT AND PLAN:  loose, mucoid stool and lower abdominal pain. Patient had colonoscopy approximately 4 years ago with findings of terminal ileal ulcerations and chronic active colitis on path. Findings may have been NSAID-induced versus Crohn's disease. Patient back with loose mucoid stool, lower abdominal pain and mild weight loss over the last 2 weeks. Recent labs and CBC are unremarkable. She may in fact have IBS but repeat colonoscopy with biopsy is reasonable given her history. I asked her to avoid NSAIDs until the time of colonoscopy. The risks, benefits, and alternatives to colonoscopy with possible biopsy and possible polypectomy were discussed with the patient and they consent to proceed.

## 2012-04-01 NOTE — Progress Notes (Signed)
Agree with Ms. Guenther's assessment and plan. Vivek Grealish E. Breylon Sherrow, MD, FACG   

## 2012-04-06 ENCOUNTER — Encounter: Payer: Self-pay | Admitting: Internal Medicine

## 2012-04-06 ENCOUNTER — Ambulatory Visit (AMBULATORY_SURGERY_CENTER): Payer: BC Managed Care – PPO | Admitting: Internal Medicine

## 2012-04-06 VITALS — BP 135/83 | HR 76 | Temp 97.6°F | Resp 16 | Ht 63.0 in | Wt 185.0 lb

## 2012-04-06 DIAGNOSIS — R197 Diarrhea, unspecified: Secondary | ICD-10-CM

## 2012-04-06 DIAGNOSIS — R109 Unspecified abdominal pain: Secondary | ICD-10-CM

## 2012-04-06 DIAGNOSIS — D126 Benign neoplasm of colon, unspecified: Secondary | ICD-10-CM

## 2012-04-06 MED ORDER — GLYCOPYRROLATE 2 MG PO TABS: 2.0000 mg | ORAL_TABLET | Freq: Two times a day (BID) | ORAL | Status: DC

## 2012-04-06 MED ORDER — SODIUM CHLORIDE 0.9 % IV SOLN: 500.0000 mL | INTRAVENOUS | Status: DC

## 2012-04-06 NOTE — Patient Instructions (Addendum)
The examination looked ok except for small hemorrhoids. I took some biopsies of the colon and will let you know about them. Please start glycopyrrolate 2 mg twice a day for the pain and diarrhea. Prescription sent to the pharmacy. Call the office and make an appointment to see me in mid to late November.  Thank you for choosing me and [facility].  [name], MD, FACG    YOU HAD AN ENDOSCOPIC PROCEDURE TODAY AT THE [facility]: Refer to the procedure report that was given to you for any specific questions about what was found during the examination.  If the procedure report does not answer your questions, please call your gastroenterologist to clarify.  If you requested that your care partner not be given the details of your procedure findings, then the procedure report has been included in a sealed envelope for you to review at your convenience later.  YOU SHOULD EXPECT: Some feelings of bloating in the abdomen. Passage of more gas than usual.  Walking can help get rid of the air that was put into your GI tract during the procedure and reduce the bloating. If you had a lower endoscopy (such as a colonoscopy or flexible sigmoidoscopy) you may notice spotting of blood in your stool or on the toilet paper. If you underwent a bowel prep for your procedure, then you may not have a normal bowel movement for a few days.  DIET: Your first meal following the procedure should be a light meal and then it is ok to progress to your normal diet.  A half-sandwich or bowl of soup is an example of a good first meal.  Heavy or fried foods are harder to digest and may make you feel nauseous or bloated.  Likewise meals heavy in dairy and vegetables can cause extra gas to form and this can also increase the bloating.  Drink plenty of fluids but you should avoid alcoholic beverages for 24 hours.  ACTIVITY: Your care partner should take you home directly after the procedure.  You should  plan to take it easy, moving slowly for the rest of the day.  You can resume normal activity the day after the procedure however you should NOT DRIVE or use heavy machinery for 24 hours (because of the sedation medicines used during the test).    SYMPTOMS TO REPORT IMMEDIATELY: A gastroenterologist can be reached at any hour.  During normal business hours, 8:30 AM to 5:00 PM Monday through Friday, call [phone].  After hours and on weekends, please call the GI answering service at [phone] who will take a message and have the physician on call contact you.   Following lower endoscopy (colonoscopy or flexible sigmoidoscopy):  Excessive amounts of blood in the stool  Significant tenderness or worsening of abdominal pains  Swelling of the abdomen that is new, acute  Fever of 100F or higher   FOLLOW UP: If any biopsies were taken you will be contacted by phone or by letter within the next 1-3 weeks.  Call your gastroenterologist if you have not heard about the biopsies in 3 weeks.  Our staff will call the home number listed on your records the next business day following your procedure to check on you and address any questions or concerns that you may have at that time regarding the information given to you following your procedure. This is a courtesy call and so if there is no answer at the home number and we have  not heard from you through the emergency physician on call, we will assume that you have returned to your regular daily activities without incident.  SIGNATURES/CONFIDENTIALITY: You and/or your care partner have signed paperwork which will be entered into your electronic medical record.  These signatures attest to the fact that that the information above on your After Visit Summary has been reviewed and is understood.  Full responsibility of the confidentiality of this discharge information lies with you and/or your care-partner.

## 2012-04-06 NOTE — Progress Notes (Signed)
No complaints noted in the recovery room. Maw   

## 2012-04-06 NOTE — Op Note (Signed)
[facility] [address] [place], [address]   COLONOSCOPY PROCEDURE REPORT  PATIENT: Bridget Maldonado, Bridget Maldonado  MR#: [id number] BIRTHDATE: 11/21/1980 , 31  yrs. old GENDER: Female ENDOSCOPIST: [name], MD, [facility] REFERRED BY: PROCEDURE DATE:  04/06/2012 PROCEDURE:   Colonoscopy with biopsy ASA CLASS:   Class II INDICATIONS:unexplained diarrhea. MEDICATIONS: propofol (Diprivan) 500mg  IV, MAC sedation, administered by CRNA, and These medications were titrated to patient response per physician's verbal order  DESCRIPTION OF PROCEDURE:   After the risks benefits and alternatives of the procedure were thoroughly explained, informed consent was obtained.  A digital rectal exam revealed no abnormalities of the rectum.   The LB [id number]  endoscope was introduced through the anus and advanced to the terminal ileum which was intubated for a short distance. No adverse events experienced.   The quality of the prep was Suprep excellent  The instrument was then slowly withdrawn as the colon was fully examined.      COLON FINDINGS: A normal appearing cecum, ileocecal valve, and appendiceal orifice were identified.  The ascending, hepatic flexure, transverse, splenic flexure, descending, sigmoid colon and rectum appeared unremarkable.  No polyps or cancers were seen. Multiple random biopsies of the area were performed.   The mucosa appeared normal in the terminal ileum.   Small internal hemorrhoids were found.  Retroflexed views revealed internal hemorrhoids. The time to cecum=2 minutes 27 seconds.  Withdrawal time=6 minutes 31 seconds.  The scope was withdrawn and the procedure completed. COMPLICATIONS: There were no complications.  ENDOSCOPIC IMPRESSION: 1.   Normal colon; multiple random biopsies of the area were performed 2.   Normal mucosa in the terminal ileum 3.   Small internal hemorrhoids  RECOMMENDATIONS: 1.  Office will call with the results. 2.    Start glycopyrrolate 2 mg bid Call for appointment to see Dr.  Leone Payor - mid or late November follow-up   eSigned:  [name], MD, [facility] 04/06/2012 2:52 PM   cc: Karie Schwalbe, MD and The Patient

## 2012-04-06 NOTE — Progress Notes (Signed)
Patient did not experience any of the following events: a burn prior to discharge; a fall within the facility; wrong site/side/patient/procedure/implant event; or a hospital transfer or hospital admission upon discharge from the facility. (G8907) Patient did not have preoperative order for IV antibiotic SSI prophylaxis. (G8918)  

## 2012-04-07 ENCOUNTER — Telehealth: Payer: Self-pay

## 2012-04-07 NOTE — Telephone Encounter (Signed)
  Follow up Call-  Call back number 04/06/2012  Post procedure Call Back phone  # [phone]  Permission to leave phone message Yes     Patient questions:  Do you have a fever, pain , or abdominal swelling? no Pain Score  0 *  Have you tolerated food without any problems? yes  Have you been able to return to your normal activities? yes  Do you have any questions about your discharge instructions: Diet   no Medications  no Follow up visit  no  Do you have questions or concerns about your Care? no  Actions: * If pain score is 4 or above: No action needed, pain <4.

## 2012-05-07 ENCOUNTER — Ambulatory Visit: Payer: BC Managed Care – PPO | Admitting: Internal Medicine

## 2012-06-03 ENCOUNTER — Encounter: Payer: Self-pay | Admitting: *Deleted

## 2012-06-07 ENCOUNTER — Ambulatory Visit (INDEPENDENT_AMBULATORY_CARE_PROVIDER_SITE_OTHER): Payer: BC Managed Care – PPO | Admitting: Internal Medicine

## 2012-06-07 ENCOUNTER — Encounter: Payer: Self-pay | Admitting: Internal Medicine

## 2012-06-07 VITALS — BP 118/80 | HR 82 | Ht 63.0 in | Wt 186.4 lb

## 2012-06-07 DIAGNOSIS — K589 Irritable bowel syndrome without diarrhea: Secondary | ICD-10-CM | POA: Insufficient documentation

## 2012-06-07 MED ORDER — ALOSETRON HCL 0.5 MG PO TABS: 0.5000 mg | ORAL_TABLET | Freq: Two times a day (BID) | ORAL | Status: DC

## 2012-06-07 NOTE — Patient Instructions (Addendum)
Today you have been given a written rx for Lotronex and a copy of the consent form you signed.  We also gave you a savings program card and information.  Call us back in a month to update [place] or sooner if needed.  Thank you for choosing me and [facility].  [name], M.D., [facility]

## 2012-06-07 NOTE — Progress Notes (Signed)
  Subjective:    Patient ID: Bridget Maldonado, female    DOB: 07/02/1980, 31 y.o.   MRN: [id number]  HPI The patient returns for followup of IBS. Colonoscopy with random biopsies and terminal ileoscopy showed no colitis or ileitis. She did have some terminal ileal erosions previously, of unclear etiology. Glycopyrrolate was prescribed but it caused constipation. She is better overall but still has urgent defecation there is generally loose, bloating distention and crampy abdominal pain. She has not had any bleeding.  Medications, allergies, past medical history, past surgical history, family history and social history are reviewed and updated in the EMR.  Review of Systems As above    Objective:   Physical Exam Obese no acute distress Abdomen is soft minimally tender benign overall slightly distended, bowel sounds are present       Assessment & Plan:   1. IBS (irritable bowel syndrome)-diarrhea predominant    1. I think she's a good candidate to try Lotronex, 0.5 mg twice daily. I've explained all the risks benefits and indications of this, she has reviewed the information and form provided and signed that. 2. She is to call back within a month to report on the results of the therapeutic trial and to decide whether we continue, stopped or increase the dose depending upon how she is doing.  CC: Tillman Abide, MD

## 2012-06-21 ENCOUNTER — Ambulatory Visit (INDEPENDENT_AMBULATORY_CARE_PROVIDER_SITE_OTHER): Payer: BC Managed Care – PPO | Admitting: Internal Medicine

## 2012-06-21 ENCOUNTER — Encounter: Payer: Self-pay | Admitting: Internal Medicine

## 2012-06-21 ENCOUNTER — Encounter: Payer: Self-pay | Admitting: *Deleted

## 2012-06-21 VITALS — BP 128/80 | HR 100 | Temp 98.0°F | Wt 188.0 lb

## 2012-06-21 DIAGNOSIS — J111 Influenza due to unidentified influenza virus with other respiratory manifestations: Secondary | ICD-10-CM | POA: Insufficient documentation

## 2012-06-21 MED ORDER — HYDROCODONE-HOMATROPINE 5-1.5 MG/5ML PO SYRP: 5.0000 mL | ORAL_SOLUTION | Freq: Every evening | ORAL | Status: DC | PRN

## 2012-06-21 NOTE — Patient Instructions (Signed)
Please call if you are not improving by the end of the week

## 2012-06-21 NOTE — Progress Notes (Signed)
Subjective:    Patient ID: Bridget Maldonado, female    DOB: 1980-07-29, 29 y.o.   MRN: [id number]  HPI Started feeling bad ~4 days ago Bridget Maldonado to PA on site at work 3 days ago---diagnosed with flu and started on tamiflu Felt a little better at first---then worsening cough Chest is hurting with breathing in , etc Fever still last night---over 101 Using ibuprofen 800mg  with some success  Some sense of SOB--if active Hot showers have helped her chest some Some sore throat still Ear pain at first and this persists (feels "closed up") Not too much head congestion Watery rhinorrhea and intermittent nasal congestion  Tried contac cold and flu---not helpful nyquil cough and tessolon didn't help  Current Outpatient Prescriptions on File Prior to Visit  Medication Sig Dispense Refill  . albuterol (PROVENTIL HFA;VENTOLIN HFA) 108 (90 BASE) MCG/ACT inhaler Inhale 2 puffs into the lungs every 4 (four) hours as needed.        . cetirizine (ZYRTEC) 10 MG tablet Take 10 mg by mouth daily.      Marland Kitchen ibuprofen (ADVIL,MOTRIN) 800 MG tablet Take 800 mg by mouth daily as needed.      . mometasone (ELOCON) 0.1 % ointment Apply two times daily as needed for psoriasis.       Lorita Officer Triphasic (ORTHO TRI-CYCLEN LO) 0.18/0.215/0.25 MG-25 MCG TABS Take 1 tablet by mouth daily.          Allergies  Allergen Reactions  . Effexor (Venlafaxine Hcl)   . Fluoxetine Hcl   . Telithromycin   . Tramadol Hcl     REACTION: vomiting    Past Medical History  Diagnosis Date  . 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 Chronic headaches   . Psoriasis   . Obese   . 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 Hemorrhoids 08/2007  . IBS (irritable bowel syndrome) 08/2007  . Bipolar disorder   . Multiple allergies   . Asthma     Past Surgical History  Procedure Date  . Laparoscopic cholecystectomy 6/09    Dr. Lindie Spruce  . Tympanostomy tube placement   . Ear tube removal   . Colonoscopy w/ biopsies   .  Esophagogastroduodenoscopy     Family History  Problem Relation Age of Onset  . Breast cancer Mother   . Rheum arthritis Mother   . Asthma Father   . Anxiety disorder Father     Severe, possible bipolar  . Colon polyps Maternal Grandfather   . Irritable bowel syndrome Mother   . Irritable bowel syndrome Maternal Grandmother   . Diabetes      mat. great uncles  . Alzheimer's disease      Dad's side  . Parkinson's disease Maternal Grandfather   . Colon cancer      mat GGF    History   Social History  . Marital Status: Single    Spouse Name: N/A    Number of Children: 0  . Years of Education: N/A   Occupational History  . Naval architect for [employer]    Social History Main Topics  . Smoking status: Never Smoker   . Smokeless tobacco: Never Used  . Alcohol Use: No     Comment: 2 per month  . Drug Use: No  . Sexually Active: Not on file   Other Topics Concern  . Not on file   Social History Narrative   Lives with parents.   Review of Systems No rash Vomited  at first--likely from the tamiflu No diarrhea Appetite is off    Objective:   Physical Exam  Constitutional: She appears well-developed and well-nourished. No distress.       Coarse cough Mildly uncomfortable  HENT:       No sinus tenderness Moderate nasal inflammation Chronic scarring on both TMs but sig middle ear fluid without inflammation on the right Slight pharyngeal injection without exudate  Neck: Normal range of motion. Neck supple. No thyromegaly present.  Pulmonary/Chest: Effort normal. No respiratory distress. She has no wheezes. She has no rales.       No dullness to percussion Some generalized rhonchi   Lymphadenopathy:    She has no cervical adenopathy.  Skin: No rash noted.          Assessment & Plan:

## 2012-06-21 NOTE — Assessment & Plan Note (Signed)
Still highly symptomatic despite tamiflu No clear signs of secondary bacterial infection but has serous otitis on right Bad cough---will give hydrocodone Out of work till 1/2--note done If not improving by the end of the week--would treat empirically with amoxicillin 500mg , 2 tabs bid (#40 x 0)

## 2012-08-04 ENCOUNTER — Other Ambulatory Visit: Payer: Self-pay | Admitting: Obstetrics and Gynecology

## 2012-08-04 ENCOUNTER — Other Ambulatory Visit: Payer: Self-pay | Admitting: Family Medicine

## 2012-08-04 DIAGNOSIS — Z1231 Encounter for screening mammogram for malignant neoplasm of breast: Secondary | ICD-10-CM

## 2012-08-16 ENCOUNTER — Encounter: Payer: Self-pay | Admitting: Nurse Practitioner

## 2012-08-16 ENCOUNTER — Other Ambulatory Visit (INDEPENDENT_AMBULATORY_CARE_PROVIDER_SITE_OTHER): Payer: PRIVATE HEALTH INSURANCE

## 2012-08-16 ENCOUNTER — Ambulatory Visit (INDEPENDENT_AMBULATORY_CARE_PROVIDER_SITE_OTHER): Payer: PRIVATE HEALTH INSURANCE | Admitting: Nurse Practitioner

## 2012-08-16 ENCOUNTER — Telehealth: Payer: Self-pay | Admitting: Internal Medicine

## 2012-08-16 ENCOUNTER — Ambulatory Visit: Payer: Self-pay | Admitting: General Practice

## 2012-08-16 VITALS — BP 120/62 | HR 88 | Ht 63.0 in | Wt 180.0 lb

## 2012-08-16 DIAGNOSIS — K589 Irritable bowel syndrome without diarrhea: Secondary | ICD-10-CM

## 2012-08-16 DIAGNOSIS — R112 Nausea with vomiting, unspecified: Secondary | ICD-10-CM

## 2012-08-16 DIAGNOSIS — R109 Unspecified abdominal pain: Secondary | ICD-10-CM | POA: Insufficient documentation

## 2012-08-16 LAB — CBC WITH DIFFERENTIAL/PLATELET
Basophils Relative: 0.9 % (ref 0.0–3.0)
Eosinophils Absolute: 0.1 10*3/uL (ref 0.0–0.7)
Hemoglobin: 14.2 g/dL (ref 12.0–15.0)
Lymphs Abs: 2.1 10*3/uL (ref 0.7–4.0)
MCHC: 33.5 g/dL (ref 30.0–36.0)
MCV: 86.7 fl (ref 78.0–100.0)
Monocytes Absolute: 0.3 10*3/uL (ref 0.1–1.0)
Neutro Abs: 8.2 10*3/uL — ABNORMAL HIGH (ref 1.4–7.7)
Neutrophils Relative %: 75.6 % (ref 43.0–77.0)
RBC: 4.89 Mil/uL (ref 3.87–5.11)

## 2012-08-16 LAB — COMPREHENSIVE METABOLIC PANEL
AST: 20 U/L (ref 0–37)
Alkaline Phosphatase: 78 U/L (ref 39–117)
BUN: 14 mg/dL (ref 6–23)
Creatinine, Ser: 0.8 mg/dL (ref 0.4–1.2)
Total Bilirubin: 0.4 mg/dL (ref 0.3–1.2)

## 2012-08-16 MED ORDER — HYDROCODONE-ACETAMINOPHEN 5-325 MG PO TABS: 1.0000 | ORAL_TABLET | Freq: Four times a day (QID) | ORAL | Status: DC | PRN

## 2012-08-16 MED ORDER — HYOSCYAMINE SULFATE ER 0.375 MG PO TB12: 0.3750 mg | ORAL_TABLET | Freq: Two times a day (BID) | ORAL | Status: DC | PRN

## 2012-08-16 MED ORDER — HYDROCODONE-ACETAMINOPHEN 5-325 MG PO TABS: 1.0000 | ORAL_TABLET | Freq: Every evening | ORAL | Status: DC | PRN

## 2012-08-16 NOTE — Telephone Encounter (Signed)
Patient reports that she has a distended abdomen and is having abdominal pain.  She was seen at a primary care office and they performed an ultrasound.  She was told she only has bowel sounds on the right only.  Patient will bring copy of the records and xray report for her visit at 2:00 with Willette Cluster RNP

## 2012-08-16 NOTE — Patient Instructions (Addendum)
Your physician has requested that you go to the basement for the following lab work before leaving today: Amylase, Lipase, CMET, CBC  We have sent the following medications to your pharmacy for you to pick up at your convenience: Levbid-Twice daily as needed. Hydrocodone-Take 1 tablet once per night as needed.  Make certain not to drive, operate any machinery or drink any alcohol while taking hydrocodone.  CC: Dr Alphonsus Sias

## 2012-08-16 NOTE — Progress Notes (Signed)
08/16/2012 Bridget Maldonado [id number] 1980/09/05   History of Present Illness:   Patient is a 32 year old female followed by Dr. Leone Payor for history of diarrhea predominant irritable bowel syndrome. She was last seen in December at which time Lotronex was prescribed but patient didn't start after reading all the information we gave her. She doesn't take anything for diarrhea. Stools vary from unformed to loose at times.   Bridget Maldonado is worked in today for evaluation of abdominal pain. Approximately two weeks ago she began having left mid abdominal pain. Then on Saturday (two days ago) she had some nausea and vomiting. Pain didn't improve, she saw physician assistant at her job today and was told she had diminsihed bowel sounds on left side. An ultrasound was done which was normal. No vomiting since Saturday. Pain is 8 out of 10. Having sharp shooting pain. Pain sometimes occurs with physical activity but also when completely still. Pain not IBS type pain which is generally more diffuse.   Current Medications, Allergies, Past Medical History, Past Surgical History, Family History and Social History were reviewed in [employer] record.   Physical Exam: General: Well developed , white female in no acute distress Head: Normocephalic and atraumatic Eyes:  sclerae anicteric, conjunctiva pink  Ears: Normal auditory acuity Lungs: Clear throughout to auscultation Heart: Regular rate and rhythm Abdomen: Soft, possibly mildly distended without tympany. Hypoactive bowel sounds (NPO).  Mild left mid abdominal tenderness. No masses, no hepatomegaly. Negative carnetts sign Musculoskeletal: Symmetrical with no gross deformities  Extremities: No edema  Neurological: Alert oriented x 4, grossly nonfocal Psychological:  Alert and cooperative. Normal mood and affect  Assessment and Recommendations:  82. 32 year old female with diarrhea predominant irritable bowel syndrome. Bowel movements at  baseline.   2. Left sided abdominal pain. Patient feels this is different from IBS pain as it is more intense and has been worsening over last two weeks. Ultrasound negative this am. Etiology unclear, ? whether this is still her IBS. Will check labs including amylase, lipase, CMET, and CBC. Trial of Levbid BID prn . Will give her Hydrocodone to take at bedtime until this can be sorted out. Will call patient in a day or two with lab results and to get a condition update. Depending on clinical course and lab results we may need to proceed with a CT scan.

## 2012-08-17 ENCOUNTER — Telehealth: Payer: Self-pay | Admitting: Nurse Practitioner

## 2012-08-17 NOTE — Telephone Encounter (Signed)
See lab results.  

## 2012-08-17 NOTE — Telephone Encounter (Signed)
Patient is calling requesting lab results. Please, advise.

## 2012-08-17 NOTE — Telephone Encounter (Signed)
See results notes. 

## 2012-08-18 NOTE — Progress Notes (Signed)
Agree with Ms. Guenther's assessment and plan. Carl E. Gessner, MD, FACG   

## 2012-08-20 ENCOUNTER — Telehealth: Payer: Self-pay | Admitting: *Deleted

## 2012-08-20 NOTE — Telephone Encounter (Signed)
Message copied by Daphine Deutscher on Fri Aug 20, 2012  9:55 AM ------      Message from: Meredith Pel      Created: Fri Aug 20, 2012  7:01 AM       Rene Kocher, please see my note. Thanks      ----- Message -----         From: Daphine Deutscher, RN         Sent: 08/17/2012   2:02 PM           To: Meredith Pel, NP            Spoke with patient and gave her results. She is taking her medications. States she is still having pain but not as frequent as yesterday.       ------

## 2012-08-20 NOTE — Telephone Encounter (Signed)
Per Willette Cluster, NP, scheduled OV with Dr Leone Payor next 2 weeks. Instruct patient to call back for fever, worse symptoms. Scheduled patient on 09/07/12 2:15 PM. Spoke with patient and gave information. She states she is much better.

## 2012-08-20 NOTE — Progress Notes (Signed)
Since she is feeling alittle better let's hold off on CTscan. Will you get her an appt with Dr. Leone Payor to be seen in a couple of weeks.  In meantime if pain worsens, develops fevers she should call back asap. Thanks

## 2012-09-03 ENCOUNTER — Ambulatory Visit
Admission: RE | Admit: 2012-09-03 | Discharge: 2012-09-03 | Disposition: A | Discharge status: Home / Self Care | Payer: PRIVATE HEALTH INSURANCE | Source: Ambulatory Visit | Attending: Obstetrics and Gynecology | Admitting: Obstetrics and Gynecology

## 2012-09-03 DIAGNOSIS — Z1231 Encounter for screening mammogram for malignant neoplasm of breast: Secondary | ICD-10-CM

## 2012-09-07 ENCOUNTER — Ambulatory Visit: Payer: PRIVATE HEALTH INSURANCE | Admitting: Internal Medicine

## 2012-09-08 ENCOUNTER — Ambulatory Visit (INDEPENDENT_AMBULATORY_CARE_PROVIDER_SITE_OTHER): Payer: PRIVATE HEALTH INSURANCE | Admitting: Family Medicine

## 2012-09-08 ENCOUNTER — Encounter: Payer: Self-pay | Admitting: Family Medicine

## 2012-09-08 VITALS — BP 128/84 | HR 80 | Temp 99.1°F | Ht 63.0 in

## 2012-09-08 DIAGNOSIS — G43901 Migraine, unspecified, not intractable, with status migrainosus: Secondary | ICD-10-CM

## 2012-09-08 DIAGNOSIS — G43009 Migraine without aura, not intractable, without status migrainosus: Secondary | ICD-10-CM | POA: Insufficient documentation

## 2012-09-08 MED ORDER — CYCLOBENZAPRINE HCL 10 MG PO TABS: 10.0000 mg | ORAL_TABLET | Freq: Three times a day (TID) | ORAL | Status: DC | PRN

## 2012-09-08 MED ORDER — KETOROLAC TROMETHAMINE 60 MG/2ML IM SOLN
60.0000 mg | Freq: Once | INTRAMUSCULAR | Status: AC
  Administered 2012-09-08: 60 mg via INTRAMUSCULAR

## 2012-09-08 MED ORDER — PROMETHAZINE HCL 25 MG PO TABS: 25.0000 mg | ORAL_TABLET | Freq: Three times a day (TID) | ORAL | Status: DC | PRN

## 2012-09-08 MED ORDER — PROMETHAZINE HCL 50 MG/ML IJ SOLN
50.0000 mg | Freq: Once | INTRAMUSCULAR | Status: AC
  Administered 2012-09-08: 50 mg via INTRAMUSCULAR

## 2012-09-08 NOTE — Assessment & Plan Note (Signed)
Since Sunday with nausea/ photophobia / 1 sided pain  tx with phenergan and toradol  Since neck spasm seems to be a trigger- also given flexeril px to use prn with caution  Disc s/s of meningitis to watch for -exam is reassuring today  Disc imp of fluid intake as well Will update if not improved

## 2012-09-08 NOTE — Patient Instructions (Signed)
For migraine - injection of toradol today and phenergan  You can take phenergan at home for nausea- watch out for sedation  Work hard on getting in some fluids  Use flexeril for neck pain as needed - also watch out for sedation  Warm compress on neck is helpful Update if not improving  If fever/ neck stiffness/ worse headache--go to ER to get checked out

## 2012-09-08 NOTE — Progress Notes (Signed)
Subjective:    Patient ID: Bridget Maldonado, female    DOB: 1981/02/27, 32 y.o.   MRN: [id number]  HPI Here with headache  Pt has hx of headaches - chronic Got a bad one on Sunday night -- then woke up with stiffness of her neck and back   (she thought she had a migraine) Stayed home Monday  Vomited that day - several times (not unusual with her headaches at all)  Tuesday - again headache and nausea (no vomiting)- she took flexeril from her mother- that helped some and she was able to go to sleep   Today still has a headache  Is L side of face/ head / neck  Just aching like a toothache and also throbbing   Has sensitivity to light and sound  Never formally dx with migraine She takes ibuprofen 800- and this usually helps   Allergies make her headache worse   Neck is sore - hurts to rotate to the left   No fever or cough or ST  No sick contacts but she does visit someone at the hospital   Patient Active Problem List  Diagnosis  . ANXIETY  . Asthma  . IBS (irritable bowel syndrome)-diarrhea predominant  . Influenza  . Left sided abdominal pain  . Nausea and vomiting   Past Medical History  Diagnosis Date  . 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 Chronic headaches   . Psoriasis   . Obese   . 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 Hemorrhoids 08/2007  . IBS (irritable bowel syndrome) 08/2007  . Bipolar disorder   . Multiple allergies   . Asthma    Past Surgical History  Procedure Laterality Date  . Laparoscopic cholecystectomy  6/09    Dr. Lindie Spruce  . Tympanostomy tube placement    . Ear tube removal    . Colonoscopy w/ biopsies    . Esophagogastroduodenoscopy     History  Substance Use Topics  . Smoking status: Never Smoker   . Smokeless tobacco: Never Used  . Alcohol Use: No     Comment: 2 per month   Family History  Problem Relation Age of Onset  . Breast cancer Mother   . Rheum arthritis Mother   . Asthma Father   . Anxiety disorder Father     Severe,  possible bipolar  . Colon polyps Maternal Grandfather   . Irritable bowel syndrome Mother   . Irritable bowel syndrome Maternal Grandmother   . Diabetes      mat. great uncles  . Alzheimer's disease      Dad's side  . Parkinson's disease Maternal Grandfather   . Colon cancer      mat GGF   Allergies  Allergen Reactions  . Effexor (Venlafaxine Hcl)   . Fluoxetine Hcl   . Telithromycin   . Tramadol Hcl     REACTION: vomiting   Current Outpatient Prescriptions on File Prior to Visit  Medication Sig Dispense Refill  . albuterol (PROVENTIL HFA;VENTOLIN HFA) 108 (90 BASE) MCG/ACT inhaler Inhale 2 puffs into the lungs every 4 (four) hours as needed.        . cetirizine (ZYRTEC) 10 MG tablet Take 10 mg by mouth daily.      . hyoscyamine (LEVBID) 0.375 MG 12 hr tablet Take 1 tablet (0.375 mg total) by mouth 2 (two) times daily as needed.  30 tablet  0  . ibuprofen (ADVIL,MOTRIN) 800 MG tablet Take 800 mg by mouth daily  as needed.      . mometasone (ELOCON) 0.1 % ointment Apply two times daily as needed for psoriasis.       Lorita Officer Triphasic (ORTHO TRI-CYCLEN LO) 0.18/0.215/0.25 MG-25 MCG TABS Take 1 tablet by mouth daily.         No current facility-administered medications on file prior to visit.        Review of Systems Review of Systems  Constitutional: Negative for fever, appetite change, fatigue and unexpected weight change.  Eyes: Negative for pain and visual disturbance.  Respiratory: Negative for cough and shortness of breath.   Cardiovascular: Negative for cp or palpitations    Gastrointestinal: Negative for  diarrhea and constipation. pos for nausea with headache Genitourinary: Negative for urgency and frequency.  Skin: Negative for pallor or rash   Neurological: Negative for weakness, light-headedness, numbness and pos for headache on L side  MSk pos for neck soreness on L side  Hematological: Negative for adenopathy. Does not bruise/bleed easily.   Psychiatric/Behavioral: Negative for dysphoric mood. The patient is not nervous/anxious.         Objective:   Physical Exam  Constitutional: She is oriented to person, place, and time. She appears well-developed and well-nourished. No distress.  HENT:  Head: Normocephalic and atraumatic.  Right Ear: External ear normal.  Left Ear: External ear normal.  Nose: Nose normal.  Mouth/Throat: Oropharynx is clear and moist.  No sinus tenderness  Eyes: Conjunctivae and EOM are normal. Pupils are equal, round, and reactive to light. Right eye exhibits no discharge. Left eye exhibits no discharge. No scleral icterus.  Neck: Normal range of motion. Neck supple. Carotid bruit is not present. Erythema present. No Brudzinski's sign and no Kernig's sign noted. No thyromegaly present.  L sided para cervical muscle tenderness and trapezius tenderness  Cardiovascular: Normal rate and regular rhythm.   Pulmonary/Chest: Effort normal and breath sounds normal. No respiratory distress.  Abdominal: Soft. Bowel sounds are normal. She exhibits no distension. There is no tenderness.  Musculoskeletal: She exhibits no edema.  Lymphadenopathy:    She has no cervical adenopathy.  Neurological: She is alert and oriented to person, place, and time. She has normal reflexes. She displays no atrophy. No cranial nerve deficit or sensory deficit. She exhibits normal muscle tone. Coordination and gait normal.  No cerebellar signs  Skin: Skin is warm and dry. No rash noted. No erythema. No pallor.  Psychiatric: She has a normal mood and affect.          Assessment & Plan:

## 2012-09-10 ENCOUNTER — Telehealth: Payer: Self-pay | Admitting: Internal Medicine

## 2012-09-10 NOTE — Telephone Encounter (Signed)
You can tell her now I do not intend to renew narcotic Rx She needs to be seen for any further Tx

## 2012-09-10 NOTE — Telephone Encounter (Signed)
Left a message for patient to call me. 

## 2012-09-10 NOTE — Telephone Encounter (Signed)
Rene Kocher please advise on this request , thank you.

## 2012-09-10 NOTE — Telephone Encounter (Signed)
Called patient and discussed that Hydrocodone was d/c'ed from her medication list on 09/08/12 by Dr. Royden Purl office with the note she had not taken it in the last 30 days. Patient denies this. She states she takes the Hydrocodone for stomach pain. She states she is taking her Levbid BID as needed and it helps but she does use Hydrocodone also. Told patient she got the rx on 08/16/12 and should still have enough medications until Monday when Dr. Leone Payor returns. She states she does have enough. Told her, he will need to decide on refill. She cancelled her OV on 09/07/12 and rescheduled to 09/28/12 and will need more Hydrocodone prior to OV. Please, advise.

## 2012-09-10 NOTE — Telephone Encounter (Signed)
Spoke with patient and gave her Dr. Marvell Fuller answer. Patient understands.

## 2012-09-28 ENCOUNTER — Encounter: Payer: Self-pay | Admitting: Internal Medicine

## 2012-09-28 ENCOUNTER — Other Ambulatory Visit (INDEPENDENT_AMBULATORY_CARE_PROVIDER_SITE_OTHER): Payer: PRIVATE HEALTH INSURANCE

## 2012-09-28 ENCOUNTER — Ambulatory Visit (INDEPENDENT_AMBULATORY_CARE_PROVIDER_SITE_OTHER): Payer: PRIVATE HEALTH INSURANCE | Admitting: Internal Medicine

## 2012-09-28 VITALS — BP 128/80 | HR 76 | Ht 63.0 in | Wt 178.8 lb

## 2012-09-28 DIAGNOSIS — R1031 Right lower quadrant pain: Secondary | ICD-10-CM

## 2012-09-28 DIAGNOSIS — K589 Irritable bowel syndrome without diarrhea: Secondary | ICD-10-CM

## 2012-09-28 MED ORDER — ALOSETRON HCL 0.5 MG PO TABS: 0.5000 mg | ORAL_TABLET | Freq: Two times a day (BID) | ORAL | Status: DC

## 2012-09-28 MED ORDER — HYDROCODONE-ACETAMINOPHEN 5-325 MG PO TABS: 1.0000 | ORAL_TABLET | Freq: Four times a day (QID) | ORAL | Status: DC | PRN

## 2012-09-28 NOTE — Progress Notes (Signed)
  Subjective:    Patient ID: Bridget Maldonado, female    DOB: [birth year], 32 y.o.   MRN: [id number]  HPI Bridget Maldonado is here for follow-up of IBS. Was seen by Willette Cluster, NP in past few weeks. Labs ok. She was started on hyoscyamine and it has helped but still having fairky significant cramps at times. She is asking for a refill of hydrocodone to relieve severe pain. No bleeding reported. Rarely uses a stool softener when misses a bowel movement.  Did not start Lotronex as planned last Dec. Is willing and ready to do so now.  Medications, allergies, past medical history, past surgical history, family history and social history are reviewed and updated in the EMR.  Review of Systems As above    Objective:   Physical Exam General:  NAD Eyes:   anicteric Abdomen:  soft and nontender, BS+ Ext:   no edema    Data Reviewed:  Willette Cluster visit labs        Assessment & Plan:  IBS (irritable bowel syndrome) - Plan: t-Transglutaminase (tTG) IgG, IgA  RLQ abdominal pain - Plan: HYDROcodone-acetaminophen (NORCO/VICODIN) 5-325 MG per tablet, t-Transglutaminase (tTG) IgG, IgA  1. TTG Ab and IgA level 2. Hydrocodone APAP 5-325 #30 no refills and will not be chronic 3. Lotronex 0.5 mg bid to start if celiac tests negative, would stop hyoscyamine then - I have again reviewed possible side effects of Lotronex and she has reviewed the handouts before 4. REV 2 months

## 2012-09-28 NOTE — Patient Instructions (Addendum)
Your physician has requested that you go to the basement for lab work before leaving today.  We are giving you printed rx's today for Norco and Lotronex.  Do not fill the Lotronex until we have the labs back for your celiac testing.  Thank you for choosing me and [facility].  [name], M.D., [facility]

## 2012-12-28 ENCOUNTER — Encounter: Payer: Self-pay | Admitting: Internal Medicine

## 2012-12-28 ENCOUNTER — Ambulatory Visit (INDEPENDENT_AMBULATORY_CARE_PROVIDER_SITE_OTHER): Payer: PRIVATE HEALTH INSURANCE | Admitting: Internal Medicine

## 2012-12-28 VITALS — BP 130/90 | HR 96 | Temp 98.6°F | Wt 180.0 lb

## 2012-12-28 DIAGNOSIS — K589 Irritable bowel syndrome without diarrhea: Secondary | ICD-10-CM

## 2012-12-28 DIAGNOSIS — R1031 Right lower quadrant pain: Secondary | ICD-10-CM

## 2012-12-28 MED ORDER — HYDROCODONE-ACETAMINOPHEN 5-325 MG PO TABS: 1.0000 | ORAL_TABLET | Freq: Four times a day (QID) | ORAL | Status: DC | PRN

## 2012-12-28 NOTE — Assessment & Plan Note (Signed)
Now with severe exacerbation Never started the lotronex for fear of side effects Now needs something more than the hyoscyamine  Will send note to Dr Leone Payor and ask him to suggest alternatives

## 2012-12-28 NOTE — Progress Notes (Signed)
Subjective:    Patient ID: Bridget Maldonado, female    DOB: 1981-03-02, 32 y.o.   MRN: [id number]  HPI Is on the IBS med-- hyoscyamine (not helping for the severe pain) Hasn't been using the lotronex--didn't get filled due to concern for the side effects  Now having more severe abdominal pain Gets stabbing pain "out of nowhere" Worse if she bends over and sudden Seems better with increased water intake in the past few days Stabbing pains stay for 30-60 seconds and recurs Frequent bowels during this time (so not clearly related to pain spells) No blood in stool  gets distended abdomen with the pain This is the first really bad episode since seeing Dr Leone Payor  Current Outpatient Prescriptions on File Prior to Visit  Medication Sig Dispense Refill  . albuterol (PROVENTIL HFA;VENTOLIN HFA) 108 (90 BASE) MCG/ACT inhaler Inhale 2 puffs into the lungs every 4 (four) hours as needed.        Marland Kitchen alosetron (LOTRONEX) 0.5 MG tablet Take 1 tablet (0.5 mg total) by mouth 2 (two) times daily.  60 tablet  2  . cyclobenzaprine (FLEXERIL) 10 MG tablet Take 1 tablet (10 mg total) by mouth 3 (three) times daily as needed for muscle spasms.  20 tablet  0  . HYDROcodone-acetaminophen (NORCO/VICODIN) 5-325 MG per tablet Take 1 tablet by mouth every 6 (six) hours as needed for pain.  30 tablet  0  . hyoscyamine (LEVBID) 0.375 MG 12 hr tablet Take 1 tablet (0.375 mg total) by mouth 2 (two) times daily as needed.  30 tablet  0  . ibuprofen (ADVIL,MOTRIN) 800 MG tablet Take 800 mg by mouth daily as needed.      . mometasone (ELOCON) 0.1 % ointment Apply two times daily as needed for psoriasis.        No current facility-administered medications on file prior to visit.    Allergies  Allergen Reactions  . Effexor (Venlafaxine Hcl)   . Fluoxetine Hcl   . Telithromycin   . Tramadol Hcl     REACTION: vomiting    Past Medical History  Diagnosis Date  . 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 Chronic  headaches   . Psoriasis   . Obese   . 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 Hemorrhoids 08/2007  . IBS (irritable bowel syndrome) 08/2007  . Bipolar disorder   . Multiple allergies   . Asthma     Past Surgical History  Procedure Laterality Date  . Laparoscopic cholecystectomy  6/09    Dr. Lindie Spruce  . Tympanostomy tube placement    . Ear tube removal    . Colonoscopy w/ biopsies    . Esophagogastroduodenoscopy      Family History  Problem Relation Age of Onset  . Breast cancer Mother   . Rheum arthritis Mother   . Asthma Father   . Anxiety disorder Father     Severe, possible bipolar  . Colon polyps Maternal Grandfather   . Irritable bowel syndrome Mother   . Irritable bowel syndrome Maternal Grandmother   . Diabetes      mat. great uncles  . Alzheimer's disease      Dad's side  . Parkinson's disease Maternal Grandfather   . Colon cancer      mat GGF    History   Social History  . Marital Status: Single    Spouse Name: N/A    Number of Children: 0  . Years of Education: N/A  Occupational History  . Naval architect for [employer]    Social History Main Topics  . Smoking status: Never Smoker   . Smokeless tobacco: Never Used  . Alcohol Use: No     Comment: 2 per month  . Drug Use: No  . Sexually Active: Yes   Other Topics Concern  . Not on file   Social History Narrative   Lives with parents.   Review of Systems Appetite isn't great---but eating about the same Weight is stable (had lost some, then regained) No cough or SOB (but the pain will momentarily take her breath away)    Objective:   Physical Exam  Constitutional: She appears well-developed and well-nourished. No distress.  Pulmonary/Chest: Effort normal and breath sounds normal. No respiratory distress. She has no wheezes. She has no rales.  Abdominal: Soft. She exhibits no distension and no mass. There is no tenderness. There is no rebound and no guarding.          Assessment & Plan:

## 2012-12-29 ENCOUNTER — Telehealth: Payer: Self-pay | Admitting: Internal Medicine

## 2012-12-29 NOTE — Telephone Encounter (Signed)
I called her re: IBS and asked her to try the Lotronex Rx - we reviewed side effects and risks but I explained this is likely to help her a great deal and wanted her to try it.  She will and will schedule REV with me for August

## 2013-02-07 ENCOUNTER — Other Ambulatory Visit: Payer: Self-pay | Admitting: Internal Medicine

## 2013-04-12 ENCOUNTER — Encounter: Payer: Self-pay | Admitting: Internal Medicine

## 2013-04-12 ENCOUNTER — Ambulatory Visit (INDEPENDENT_AMBULATORY_CARE_PROVIDER_SITE_OTHER): Payer: PRIVATE HEALTH INSURANCE | Admitting: Internal Medicine

## 2013-04-12 VITALS — BP 120/80 | HR 97 | Temp 98.0°F | Wt 182.0 lb

## 2013-04-12 DIAGNOSIS — K5900 Constipation, unspecified: Secondary | ICD-10-CM | POA: Insufficient documentation

## 2013-04-12 NOTE — Patient Instructions (Signed)
Please try miralax (polyethylene glycol), 1 capful in 8 ounce glass of water every 2 hours. Continue through tomorrow evening or if you empty out If you are not clear then, you can try a stimulant then (suppository or dulcolax/mag citrate orally) If this works, you can try the miralax if you go a couple of days without a stool--- to avoid this situation

## 2013-04-12 NOTE — Assessment & Plan Note (Signed)
Usually goes several times per day Instructed on regimen to clear out

## 2013-04-12 NOTE — Progress Notes (Signed)
Subjective:    Patient ID: Bridget Maldonado, female    DOB: [birth year], 32 y.o.   MRN: [id number]  HPI Now having problems with constipation No stool for 4-5 days Went to work nurse--given 3 laxatives without results (dulcolax) Abdomen tense and tender  Used 2 supposities she had---still no effects  Slight blood with last stool On toilet paper  Now hurts for deep breath or to walk  Hasn't had much trouble with the IBS Never started the lotronex Avoiding fried foods and dairy and things seem better  Current Outpatient Prescriptions on File Prior to Visit  Medication Sig Dispense Refill  . albuterol (PROVENTIL HFA;VENTOLIN HFA) 108 (90 BASE) MCG/ACT inhaler Inhale 2 puffs into the lungs every 4 (four) hours as needed.        Marland Kitchen alosetron (LOTRONEX) 0.5 MG tablet Take 1 tablet (0.5 mg total) by mouth 2 (two) times daily.  60 tablet  2  . cyclobenzaprine (FLEXERIL) 10 MG tablet Take 1 tablet (10 mg total) by mouth 3 (three) times daily as needed for muscle spasms.  20 tablet  0  . HYDROcodone-acetaminophen (NORCO/VICODIN) 5-325 MG per tablet Take 1 tablet by mouth every 6 (six) hours as needed for pain.  30 tablet  0  . hyoscyamine (LEVBID) 0.375 MG 12 hr tablet Take 1 tablet (0.375 mg total) by mouth 2 (two) times daily as needed.  30 tablet  0  . ibuprofen (ADVIL,MOTRIN) 800 MG tablet Take 800 mg by mouth daily as needed.      . mometasone (ELOCON) 0.1 % ointment APPLY TWICE DAILY AS NEEDED FOR PSORIASIS  45 g  1  . Norgestimate-Ethinyl Estradiol Triphasic (ORTHO TRI-CYCLEN LO) 0.18/0.215/0.25 MG-25 MCG tab Take 1 tablet by mouth daily.       No current facility-administered medications on file prior to visit.    Allergies  Allergen Reactions  . Effexor [Venlafaxine Hcl]   . Fluoxetine Hcl   . Telithromycin   . Tramadol Hcl     REACTION: vomiting    Past Medical History  Diagnosis Date  . 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 Chronic headaches   . Psoriasis   . Obese    . 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 Hemorrhoids 08/2007  . IBS (irritable bowel syndrome) 08/2007  . Bipolar disorder   . Multiple allergies   . Asthma     Past Surgical History  Procedure Laterality Date  . Laparoscopic cholecystectomy  6/09    Dr. Lindie Spruce  . Tympanostomy tube placement    . Ear tube removal    . Colonoscopy w/ biopsies    . Esophagogastroduodenoscopy      Family History  Problem Relation Age of Onset  . Breast cancer Mother   . Rheum arthritis Mother   . Asthma Father   . Anxiety disorder Father     Severe, possible bipolar  . Colon polyps Maternal Grandfather   . Irritable bowel syndrome Mother   . Irritable bowel syndrome Maternal Grandmother   . Diabetes      mat. great uncles  . Alzheimer's disease      Dad's side  . Parkinson's disease Maternal Grandfather   . Colon cancer      mat GGF    History   Social History  . Marital Status: Single    Spouse Name: N/A    Number of Children: 0  . Years of Education: N/A   Occupational History  . Naval architect for  [employer]    Social History Main Topics  . Smoking status: Never Smoker   . Smokeless tobacco: Never Used  . Alcohol Use: No     Comment: 2 per month  . Drug Use: No  . Sexual Activity: Yes   Other Topics Concern  . Not on file   Social History Narrative   Lives with parents.   Review of Systems Appetite is off Weight is stable    Objective:   Physical Exam  Constitutional: She appears well-developed and well-nourished. No distress.  Neck: Normal range of motion. Neck supple. No thyromegaly present.  Pulmonary/Chest: Effort normal and breath sounds normal. No respiratory distress. She has no wheezes. She has no rales.  Abdominal: Soft. She exhibits no distension and no mass. There is tenderness. There is no rebound and no guarding.  Slight generalized tenderness Decreased bowel sounds  Lymphadenopathy:    She has no cervical adenopathy.          Assessment &  Plan:

## 2013-05-06 ENCOUNTER — Telehealth: Payer: Self-pay | Admitting: Family Medicine

## 2013-05-06 NOTE — Telephone Encounter (Signed)
Error

## 2013-05-06 NOTE — Telephone Encounter (Signed)
Pt requesting RX for tramadol until she is able to see her Rheumotolologist on Dec 1.  She said that she spoke with Dr. Alphonsus Sias about this at her last OV but she didn't want the RX at the time.

## 2013-05-07 NOTE — Telephone Encounter (Signed)
We have tramadol listed as an allergy....Marland KitchenMarland Kitchen

## 2013-05-09 MED ORDER — TRAMADOL HCL 50 MG PO TABS: 50.0000 mg | ORAL_TABLET | Freq: Three times a day (TID) | ORAL | Status: DC | PRN

## 2013-05-09 NOTE — Telephone Encounter (Signed)
Please take it off her list Okay tramadol #30 x 0  1 tid prn for pain

## 2013-05-09 NOTE — Telephone Encounter (Signed)
Spoke with patient and she states she's not allergic to tramadol

## 2013-05-09 NOTE — Telephone Encounter (Signed)
rx called into pharmacy Spoke with patient and advised results   

## 2013-07-05 ENCOUNTER — Encounter (HOSPITAL_COMMUNITY): Payer: Self-pay | Admitting: Emergency Medicine

## 2013-07-05 ENCOUNTER — Emergency Department (HOSPITAL_COMMUNITY)
Admission: EM | Admit: 2013-07-05 | Discharge: 2013-07-05 | Disposition: A | Discharge status: Home / Self Care | Payer: PRIVATE HEALTH INSURANCE | Source: Home / Self Care | Attending: Family Medicine | Admitting: Family Medicine

## 2013-07-05 ENCOUNTER — Emergency Department (INDEPENDENT_AMBULATORY_CARE_PROVIDER_SITE_OTHER): Payer: PRIVATE HEALTH INSURANCE

## 2013-07-05 DIAGNOSIS — X503XXA Overexertion from repetitive movements, initial encounter: Secondary | ICD-10-CM

## 2013-07-05 DIAGNOSIS — S39012A Strain of muscle, fascia and tendon of lower back, initial encounter: Secondary | ICD-10-CM

## 2013-07-05 DIAGNOSIS — S335XXA Sprain of ligaments of lumbar spine, initial encounter: Secondary | ICD-10-CM

## 2013-07-05 MED ORDER — DICLOFENAC POTASSIUM 50 MG PO TABS: 50.0000 mg | ORAL_TABLET | Freq: Three times a day (TID) | ORAL | Status: DC

## 2013-07-05 MED ORDER — CYCLOBENZAPRINE HCL 5 MG PO TABS: 5.0000 mg | ORAL_TABLET | Freq: Three times a day (TID) | ORAL | Status: DC | PRN

## 2013-07-05 NOTE — ED Provider Notes (Signed)
CSN: [id number]     Arrival date & time 07/05/13  1219 History   First MD Initiated Contact with Patient 07/05/13 1325     Chief Complaint  Patient presents with  . Back Pain   (Consider location/radiation/quality/duration/timing/severity/associated sxs/prior Treatment) Patient is a 33 y.o. female presenting with back pain. The history is provided by the patient.  Back Pain Location:  Lumbar spine Quality:  Aching Radiates to:  Does not radiate Pain severity:  Moderate Onset quality:  Gradual Duration:  2 weeks Timing:  Constant Progression:  Waxing and waning Chronicity:  Chronic (had fall approx 1 yr ago, back problems since related to heavy lifting at work according to pt.) Context: lifting heavy objects   Ineffective treatments:  NSAIDs Associated symptoms: leg pain, numbness and paresthesias   Associated symptoms: no abdominal pain, no bladder incontinence, no bowel incontinence, no chest pain, no fever, no pelvic pain, no perianal numbness and no weakness     Past Medical History  Diagnosis Date  . 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 Chronic headaches   . Psoriasis   . Obese   . 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 Hemorrhoids 08/2007  . IBS (irritable bowel syndrome) 08/2007  . Bipolar disorder   . Multiple allergies   . Asthma    Past Surgical History  Procedure Laterality Date  . Laparoscopic cholecystectomy  6/09    Dr. Hulen Skains  . Tympanostomy tube placement    . Ear tube removal    . Colonoscopy w/ biopsies    . Esophagogastroduodenoscopy     Family History  Problem Relation Age of Onset  . Breast cancer Mother   . Rheum arthritis Mother   . Asthma Father   . Anxiety disorder Father     Severe, possible bipolar  . Colon polyps Maternal Grandfather   . Irritable bowel syndrome Mother   . Irritable bowel syndrome Maternal Grandmother   . Diabetes      mat. great uncles  . Alzheimer's disease      Dad's side  . Parkinson's disease Maternal Grandfather    . Colon cancer      mat GGF   History  Substance Use Topics  . Smoking status: Never Smoker   . Smokeless tobacco: Never Used  . Alcohol Use: No     Comment: 2 per month   OB History   Grav Para Term Preterm Abortions TAB SAB Ect Mult Living                 Review of Systems  Constitutional: Negative.  Negative for fever.  Cardiovascular: Negative for chest pain.  Gastrointestinal: Negative.  Negative for abdominal pain and bowel incontinence.  Genitourinary: Negative.  Negative for bladder incontinence and pelvic pain.  Musculoskeletal: Positive for back pain and myalgias. Negative for gait problem, joint swelling and neck pain.  Skin: Negative.   Neurological: Positive for numbness and paresthesias. Negative for weakness.    Allergies  Effexor; Fluoxetine hcl; and Telithromycin  Home Medications   Current Outpatient Rx  Name  Route  Sig  Dispense  Refill  . HYDROcodone-acetaminophen (NORCO/VICODIN) 5-325 MG per tablet   Oral   Take 1 tablet by mouth every 6 (six) hours as needed for pain.   30 tablet   0   . ibuprofen (ADVIL,MOTRIN) 800 MG tablet   Oral   Take 800 mg by mouth daily as needed.         . Norgestimate-Ethinyl  Estradiol Triphasic (ORTHO TRI-CYCLEN LO) 0.18/0.215/0.25 MG-25 MCG tab   Oral   Take 1 tablet by mouth daily.         . traMADol (ULTRAM) 50 MG tablet   Oral   Take 1 tablet (50 mg total) by mouth 3 (three) times daily as needed.   30 tablet   0   . albuterol (PROVENTIL HFA;VENTOLIN HFA) 108 (90 BASE) MCG/ACT inhaler   Inhalation   Inhale 2 puffs into the lungs every 4 (four) hours as needed.           Marland Kitchen alosetron (LOTRONEX) 0.5 MG tablet   Oral   Take 1 tablet (0.5 mg total) by mouth 2 (two) times daily.   60 tablet   2   . cyclobenzaprine (FLEXERIL) 10 MG tablet   Oral   Take 1 tablet (10 mg total) by mouth 3 (three) times daily as needed for muscle spasms.   20 tablet   0   . cyclobenzaprine (FLEXERIL) 5 MG tablet    Oral   Take 1 tablet (5 mg total) by mouth 3 (three) times daily as needed for muscle spasms.   30 tablet   0   . diclofenac (CATAFLAM) 50 MG tablet   Oral   Take 1 tablet (50 mg total) by mouth 3 (three) times daily. For back pain   30 tablet   0   . hyoscyamine (LEVBID) 0.375 MG 12 hr tablet   Oral   Take 1 tablet (0.375 mg total) by mouth 2 (two) times daily as needed.   30 tablet   0   . mometasone (ELOCON) 0.1 % ointment      APPLY TWICE DAILY AS NEEDED FOR PSORIASIS   45 g   1    BP 140/92  Pulse 112  Temp(Src) 97.9 F (36.6 C) (Oral)  Resp 16  Ht 5\' 3"  (1.6 m)  Wt 180 lb (81.647 kg)  BMI 31.89 kg/m2  SpO2 100%  LMP 06/14/2013 Physical Exam  Nursing note and vitals reviewed. Constitutional: She is oriented to person, place, and time. She appears well-developed and well-nourished.  Abdominal: Soft. Bowel sounds are normal.  Musculoskeletal: She exhibits tenderness.       Lumbar back: She exhibits tenderness, bony tenderness, pain and spasm. She exhibits normal range of motion, no deformity and normal pulse.       Back:  Neurological: She is alert and oriented to person, place, and time.  Skin: Skin is warm and dry.    ED Course  Procedures (including critical care time) Labs Review Labs Reviewed - No data to display Imaging Review Dg Lumbar Spine Complete  07/05/2013   CLINICAL DATA:  Back pain.  EXAM: LUMBAR SPINE - COMPLETE 4+ VIEW  COMPARISON:  None.  FINDINGS: There is no evidence of lumbar spine fracture. Alignment is normal. Intervertebral disc spaces are maintained.  IMPRESSION: Negative.   Electronically Signed   By: Inge Rise M.D.   On: 07/05/2013 14:56    EKG Interpretation    Date/Time:    Ventricular Rate:    PR Interval:    QRS Duration:   QT Interval:    QTC Calculation:   R Axis:     Text Interpretation:              MDM  X-rays reviewed and report per radiologist.     Billy Fischer, MD 07/05/13 [phone]

## 2013-07-05 NOTE — Discharge Instructions (Signed)
Heat to back, use medicine as needed, see orthopedist if further problems.

## 2013-07-05 NOTE — ED Notes (Signed)
C/o back pain.  Tingling and numbness.   States does a lot of heavy lifting for work which makes back pain worse.  Hx of injury 1 yr ago.  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 No relief with otc meds.

## 2013-07-11 ENCOUNTER — Emergency Department: Payer: Self-pay | Admitting: Emergency Medicine

## 2013-07-11 ENCOUNTER — Telehealth: Payer: Self-pay | Admitting: Internal Medicine

## 2013-07-11 LAB — CBC WITH DIFFERENTIAL/PLATELET
BANDS NEUTROPHIL: 6 %
Basophil: 1 %
Eosinophil: 1 %
HCT: 43.6 % (ref 35.0–47.0)
HGB: 14.7 g/dL (ref 12.0–16.0)
LYMPHS PCT: 33 %
MCH: 28.8 pg (ref 26.0–34.0)
MCHC: 33.7 g/dL (ref 32.0–36.0)
MCV: 86 fL (ref 80–100)
Monocytes: 6 %
Platelet: 151 10*3/uL (ref 150–440)
RBC: 5.1 10*6/uL (ref 3.80–5.20)
RDW: 13.7 % (ref 11.5–14.5)
SEGMENTED NEUTROPHILS: 28 %
VARIANT LYMPHOCYTE - H1-RLYMPH: 25 %
WBC: 10.1 10*3/uL (ref 3.6–11.0)

## 2013-07-11 LAB — COMPREHENSIVE METABOLIC PANEL
ALBUMIN: 3.3 g/dL — AB (ref 3.4–5.0)
Alkaline Phosphatase: 134 U/L — ABNORMAL HIGH
Anion Gap: 5 — ABNORMAL LOW (ref 7–16)
BILIRUBIN TOTAL: 0.5 mg/dL (ref 0.2–1.0)
BUN: 12 mg/dL (ref 7–18)
Calcium, Total: 8.9 mg/dL (ref 8.5–10.1)
Chloride: 105 mmol/L (ref 98–107)
Co2: 28 mmol/L (ref 21–32)
Creatinine: 0.79 mg/dL (ref 0.60–1.30)
EGFR (African American): >60
Glucose: 89 mg/dL (ref 65–99)
Osmolality: 275 (ref 275–301)
POTASSIUM: 3.3 mmol/L — AB (ref 3.5–5.1)
SGOT(AST): 75 U/L — ABNORMAL HIGH (ref 15–37)
SGPT (ALT): 86 U/L — ABNORMAL HIGH (ref 12–78)
Sodium: 138 mmol/L (ref 136–145)
Total Protein: 7.7 g/dL (ref 6.4–8.2)

## 2013-07-11 LAB — URINALYSIS, COMPLETE
BILIRUBIN, UR: NEGATIVE
BLOOD: NEGATIVE
Glucose,UR: NEGATIVE mg/dL (ref 0–75)
Leukocyte Esterase: NEGATIVE
Nitrite: NEGATIVE
PH: 5 (ref 4.5–8.0)
Protein: 30
Specific Gravity: 1.027 (ref 1.003–1.030)
WBC UR: 4 /HPF (ref 0–5)

## 2013-07-11 LAB — LIPASE, BLOOD: Lipase: 131 U/L (ref 73–393)

## 2013-07-11 NOTE — Telephone Encounter (Signed)
Please try to call her back to see what is going on I did just get Dr Scharlene Gloss recent note---I know he is trying prednisone  And therapy

## 2013-07-11 NOTE — Telephone Encounter (Signed)
Pt called regarding back pain. RN left msg to call back.

## 2013-07-13 NOTE — Telephone Encounter (Signed)
Left message on machine asking pt to return my call about the back pain.

## 2013-07-14 ENCOUNTER — Ambulatory Visit (INDEPENDENT_AMBULATORY_CARE_PROVIDER_SITE_OTHER): Payer: PRIVATE HEALTH INSURANCE | Admitting: Internal Medicine

## 2013-07-14 ENCOUNTER — Encounter: Payer: Self-pay | Admitting: Internal Medicine

## 2013-07-14 ENCOUNTER — Telehealth: Payer: Self-pay

## 2013-07-14 VITALS — BP 120/78 | HR 103 | Temp 99.4°F | Wt 181.5 lb

## 2013-07-14 DIAGNOSIS — R509 Fever, unspecified: Secondary | ICD-10-CM

## 2013-07-14 DIAGNOSIS — M549 Dorsalgia, unspecified: Secondary | ICD-10-CM

## 2013-07-14 MED ORDER — TRAMADOL HCL 50 MG PO TABS: 50.0000 mg | ORAL_TABLET | Freq: Three times a day (TID) | ORAL | Status: DC | PRN

## 2013-07-14 NOTE — Telephone Encounter (Signed)
Called in Tramadol to pharmacy

## 2013-07-14 NOTE — Patient Instructions (Signed)

## 2013-07-14 NOTE — Progress Notes (Signed)
Pre-visit discussion using our clinic review tool. No additional management support is needed unless otherwise documented below in the visit note.  

## 2013-07-14 NOTE — Progress Notes (Signed)
Subjective:    Patient ID: Bridget Maldonado, female    DOB: 1980-09-16, 33 y.o.   MRN: [id number]  HPI  Pt presents to the clinic today for hospital followup of kidney infection. She was seen 1/19 at [facility].  At that point, she had been having flank pain and fever for about 5 days. She was given 2 liters of fluid in the ER and she was started on Keflex. She reports she still has fatigue and has been running fevers. Her flank pain is better but she is not sure why she is still running fevers.  Review of Systems      Past Medical History  Diagnosis Date  . 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 Chronic headaches   . Psoriasis   . Obese   . 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 Hemorrhoids 08/2007  . IBS (irritable bowel syndrome) 08/2007  . Bipolar disorder   . Multiple allergies   . Asthma     Current Outpatient Prescriptions  Medication Sig Dispense Refill  . albuterol (PROVENTIL HFA;VENTOLIN HFA) 108 (90 BASE) MCG/ACT inhaler Inhale 2 puffs into the lungs every 4 (four) hours as needed.        Marland Kitchen alosetron (LOTRONEX) 0.5 MG tablet Take 1 tablet (0.5 mg total) by mouth 2 (two) times daily.  60 tablet  2  . cyclobenzaprine (FLEXERIL) 10 MG tablet Take 1 tablet (10 mg total) by mouth 3 (three) times daily as needed for muscle spasms.  20 tablet  0  . cyclobenzaprine (FLEXERIL) 5 MG tablet Take 1 tablet (5 mg total) by mouth 3 (three) times daily as needed for muscle spasms.  30 tablet  0  . diclofenac (CATAFLAM) 50 MG tablet Take 1 tablet (50 mg total) by mouth 3 (three) times daily. For back pain  30 tablet  0  . HYDROcodone-acetaminophen (NORCO/VICODIN) 5-325 MG per tablet Take 1 tablet by mouth every 6 (six) hours as needed for pain.  30 tablet  0  . hyoscyamine (LEVBID) 0.375 MG 12 hr tablet Take 1 tablet (0.375 mg total) by mouth 2 (two) times daily as needed.  30 tablet  0  . ibuprofen (ADVIL,MOTRIN) 800 MG tablet Take 800 mg by mouth daily as needed.      . mometasone (ELOCON) 0.1 %  ointment APPLY TWICE DAILY AS NEEDED FOR PSORIASIS  45 g  1  . Norgestimate-Ethinyl Estradiol Triphasic (ORTHO TRI-CYCLEN LO) 0.18/0.215/0.25 MG-25 MCG tab Take 1 tablet by mouth daily.      . traMADol (ULTRAM) 50 MG tablet Take 1 tablet (50 mg total) by mouth 3 (three) times daily as needed.  30 tablet  0   No current facility-administered medications for this visit.    Allergies  Allergen Reactions  . Effexor [Venlafaxine Hcl]   . Fluoxetine Hcl   . Telithromycin     Family History  Problem Relation Age of Onset  . Breast cancer Mother   . Rheum arthritis Mother   . Asthma Father   . Anxiety disorder Father     Severe, possible bipolar  . Colon polyps Maternal Grandfather   . Irritable bowel syndrome Mother   . Irritable bowel syndrome Maternal Grandmother   . Diabetes      mat. great uncles  . Alzheimer's disease      Dad's side  . Parkinson's disease Maternal Grandfather   . Colon cancer      mat GGF    History  Social History  . Marital Status: Single    Spouse Name: N/A    Number of Children: 0  . Years of Education: N/A   Occupational History  . 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 Smoking status: Never Smoker   . Smokeless tobacco: Never Used  . Alcohol Use: No     Comment: 2 per month  . Drug Use: No  . Sexual Activity: Yes   Other Topics Concern  . Not on file   Social History Narrative   Lives with parents.     Constitutional: Pt reports fatigue. Denies fever, malaise, headache or abrupt weight changes.  Gastrointestinal: Denies abdominal pain, bloating, constipation, diarrhea or blood in the stool.  GU: Denies urgency, frequency, pain with urination, burning sensation, blood in urine, odor or discharge.  No other specific complaints in a complete review of systems (except as listed in HPI above).  Objective:   Physical Exam   BP 120/78  Pulse 103  Temp(Src) 99.4 F (37.4 C) (Oral)  Wt 181 lb 8 oz  (82.328 kg)  SpO2 99%  LMP 06/14/2013 Wt Readings from Last 3 Encounters:  07/14/13 181 lb 8 oz (82.328 kg)  07/05/13 180 lb (81.647 kg)  04/12/13 182 lb (82.555 kg)    General: Appears her stated age, overweight but well developed, well nourished in NAD. Cardiovascular: Normal rate and rhythm. S1,S2 noted.  No murmur, rubs or gallops noted. No JVD or BLE edema. No carotid bruits noted. Pulmonary/Chest: Normal effort and positive vesicular breath sounds. No respiratory distress. No wheezes, rales or ronchi noted.  Abdomen: Soft and nontender. Normal bowel sounds, no bruits noted. No distention or masses noted. Liver, spleen and kidneys non palpable. No CVA tenderness.   BMET    Component Value Date/Time   NA 139 08/16/2012 1515   K 4.0 08/16/2012 1515   CL 104 08/16/2012 1515   CO2 28 08/16/2012 1515   GLUCOSE 92 08/16/2012 1515   BUN 14 08/16/2012 1515   CREATININE 0.8 08/16/2012 1515   CALCIUM 9.1 08/16/2012 1515   GFRNONAA 107.02 08/13/2009 0825   GFRAA  Value: >60        The eGFR has been calculated using the MDRD equation. This calculation has not been validated in all clinical situations. eGFR's persistently <60 mL/min signify possible Chronic Kidney Disease. 08/19/2008 0033    Lipid Panel  No results found for this basename: chol, trig, hdl, cholhdl, vldl, ldlcalc    CBC    Component Value Date/Time   WBC 10.9* 08/16/2012 1515   RBC 4.89 08/16/2012 1515   HGB 14.2 08/16/2012 1515   HCT 42.4 08/16/2012 1515   PLT 259.0 08/16/2012 1515   MCV 86.7 08/16/2012 1515   MCHC 33.5 08/16/2012 1515   RDW 12.5 08/16/2012 1515   LYMPHSABS 2.1 08/16/2012 1515   MONOABS 0.3 08/16/2012 1515   EOSABS 0.1 08/16/2012 1515   BASOSABS 0.1 08/16/2012 1515    Hgb A1C No results found for this basename: HGBA1C        Assessment & Plan:   Bilateral Flank pain and fevers:  I have no hospital notes to review I asked her to repeat the urinalysis- she is unable to void after 30 minutes of  trying Not really convinced this is a simple UTI Will recheck CBC and CMET Will fax for hospital records Will switch keflex to augmentin while we are waiting to get lab results back  Will call you  as soon as the labs are back, if worse go to ER

## 2013-07-15 ENCOUNTER — Other Ambulatory Visit: Payer: Self-pay | Admitting: Internal Medicine

## 2013-07-15 ENCOUNTER — Ambulatory Visit
Admission: RE | Admit: 2013-07-15 | Discharge: 2013-07-15 | Disposition: A | Discharge status: Home / Self Care | Payer: PRIVATE HEALTH INSURANCE | Source: Ambulatory Visit | Attending: Internal Medicine | Admitting: Internal Medicine

## 2013-07-15 ENCOUNTER — Other Ambulatory Visit (INDEPENDENT_AMBULATORY_CARE_PROVIDER_SITE_OTHER): Payer: PRIVATE HEALTH INSURANCE

## 2013-07-15 DIAGNOSIS — R748 Abnormal levels of other serum enzymes: Secondary | ICD-10-CM

## 2013-07-15 LAB — COMPREHENSIVE METABOLIC PANEL
ALT: 172 U/L — ABNORMAL HIGH (ref 0–35)
AST: 172 U/L — ABNORMAL HIGH (ref 0–37)
Albumin: 3.1 g/dL — ABNORMAL LOW (ref 3.5–5.2)
Alkaline Phosphatase: 125 U/L — ABNORMAL HIGH (ref 39–117)
BILIRUBIN TOTAL: 0.5 mg/dL (ref 0.3–1.2)
BUN: 8 mg/dL (ref 6–23)
CALCIUM: 8.6 mg/dL (ref 8.4–10.5)
CHLORIDE: 106 meq/L (ref 96–112)
CO2: 27 meq/L (ref 19–32)
CREATININE: 0.7 mg/dL (ref 0.4–1.2)
GFR: 100.75 mL/min (ref >60.00)
GLUCOSE: 84 mg/dL (ref 70–99)
Potassium: 3.7 mEq/L (ref 3.5–5.1)
Sodium: 140 mEq/L (ref 135–145)
Total Protein: 6.7 g/dL (ref 6.0–8.3)

## 2013-07-15 LAB — CBC
HEMATOCRIT: 41.7 % (ref 36.0–46.0)
HEMOGLOBIN: 14.1 g/dL (ref 12.0–15.0)
MCHC: 33.8 g/dL (ref 30.0–36.0)
MCV: 85.4 fl (ref 78.0–100.0)
PLATELETS: 147 10*3/uL — AB (ref 150.0–400.0)
RBC: 4.88 Mil/uL (ref 3.87–5.11)
RDW: 14.1 % (ref 11.5–14.6)
WBC: 10.2 10*3/uL (ref 4.5–10.5)

## 2013-07-16 LAB — ACUTE HEP PANEL AND HEP B SURFACE AB
HCV Ab: NEGATIVE
HEP A IGM: NONREACTIVE
HEP B C IGM: NONREACTIVE
HEP B S AB: POSITIVE — AB
HEP B S AG: NEGATIVE

## 2013-09-20 ENCOUNTER — Other Ambulatory Visit: Payer: Self-pay

## 2013-09-20 MED ORDER — TRAMADOL HCL 50 MG PO TABS: 50.0000 mg | ORAL_TABLET | Freq: Three times a day (TID) | ORAL | Status: DC | PRN

## 2013-09-20 NOTE — Telephone Encounter (Signed)
Pt left v/m requesting refill for tramadol due to arthritis pain; pt cannot see specialist for several months and pt out of tramadol; [place]. Pt request cb.

## 2013-09-20 NOTE — Telephone Encounter (Signed)
Pt request status of refill; advised done.pt voiced understanding.

## 2013-09-20 NOTE — Telephone Encounter (Signed)
rx called into pharmacy

## 2013-09-20 NOTE — Telephone Encounter (Signed)
Okay #30 x 0 

## 2013-11-22 ENCOUNTER — Other Ambulatory Visit: Payer: Self-pay

## 2013-11-22 NOTE — Telephone Encounter (Signed)
Pt left v/m requesting refill of tramadol to [place]; pt talked with rheumatologist and was advised to get refill of tramadol from PCP. Does pt need to schedule appt or can refill be done? 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 Meloxicam does not help the pain. [facility].

## 2013-11-23 NOTE — Telephone Encounter (Signed)
I did in past but am concerned that she apparently has lied about Dr Scharlene Gloss visit (he has not diagnosed the psoriatic arthritis either)  No refill She needs appt to discuss this before I would consider refilling

## 2013-11-23 NOTE — Telephone Encounter (Signed)
Left message on VM that pt would need and appt in order to get a refill.

## 2013-11-23 NOTE — Telephone Encounter (Signed)
Okay #30 x 0 Try to get Dr Scharlene Gloss last note---- last one I have is January and he doesn't mention tramadol or psoriatic arthritis

## 2013-11-23 NOTE — Telephone Encounter (Signed)
Spoke with Dr. Scharlene Gloss office and he didn't have her taking tramadol, they last seen pt January 2015. Pt called Dr. Scharlene Gloss office Monday for a refill and it was denied because he didn't prescribe it.

## 2013-11-24 ENCOUNTER — Ambulatory Visit (INDEPENDENT_AMBULATORY_CARE_PROVIDER_SITE_OTHER): Payer: PRIVATE HEALTH INSURANCE | Admitting: Internal Medicine

## 2013-11-24 ENCOUNTER — Encounter: Payer: Self-pay | Admitting: Internal Medicine

## 2013-11-24 VITALS — BP 128/80 | HR 85 | Temp 98.3°F | Wt 181.0 lb

## 2013-11-24 DIAGNOSIS — M1990 Unspecified osteoarthritis, unspecified site: Secondary | ICD-10-CM | POA: Insufficient documentation

## 2013-11-24 DIAGNOSIS — M129 Arthropathy, unspecified: Secondary | ICD-10-CM

## 2013-11-24 MED ORDER — TRAMADOL HCL 50 MG PO TABS: 50.0000 mg | ORAL_TABLET | Freq: Three times a day (TID) | ORAL | Status: DC | PRN

## 2013-11-24 NOTE — Progress Notes (Signed)
Subjective:    Patient ID: Bridget Maldonado, female    DOB: 05-14-1981, 33 y.o.   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 albuterol (PROVENTIL HFA;VENTOLIN HFA) 108 (90 BASE) MCG/ACT inhaler Inhale 2 puffs into the lungs every 4 (four) hours as needed.        . cyclobenzaprine (FLEXERIL) 5 MG tablet Take 1 tablet (5 mg total) by mouth 3 (three) times daily as needed for muscle spasms.  30 tablet  0  . hyoscyamine (LEVBID) 0.375 MG 12 hr tablet Take 1 tablet (0.375 mg total) by mouth 2 (two) times daily as needed.  30 tablet  0  . ibuprofen (ADVIL,MOTRIN) 800 MG tablet Take 800 mg by mouth daily as needed.      . mometasone (ELOCON) 0.1 % ointment APPLY TWICE DAILY AS NEEDED FOR PSORIASIS  45 g  1  . Norgestimate-Ethinyl Estradiol Triphasic (ORTHO TRI-CYCLEN LO) 0.18/0.215/0.25 MG-25 MCG tab Take 1 tablet by mouth daily.      . traMADol (ULTRAM) 50 MG tablet Take 1 tablet (50 mg total) by mouth 3 (three) times daily as needed.  30 tablet  0   No current facility-administered medications on file prior to visit.    Allergies  Allergen Reactions  . Effexor [Venlafaxine Hcl]   . Fluoxetine Hcl   . Telithromycin     Past Medical History  Diagnosis Date  . 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 Chronic headaches   . Psoriasis   . Obese   . 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 Hemorrhoids 08/2007  . IBS (irritable bowel syndrome) 08/2007  . Bipolar disorder   . Multiple allergies   . Asthma     Past Surgical History  Procedure Laterality  Date  . Laparoscopic cholecystectomy  6/09    Dr. Hulen Skains  . Tympanostomy tube placement    . Ear tube removal    . Colonoscopy w/ biopsies    . Esophagogastroduodenoscopy      Family History  Problem Relation Age of Onset  . Breast cancer Mother   . Rheum arthritis Mother   . Asthma Father   . Anxiety disorder Father     Severe, possible bipolar  . Colon polyps Maternal Grandfather   . Irritable bowel syndrome Mother   . Irritable bowel syndrome Maternal Grandmother   . Diabetes      mat. great uncles  . Alzheimer's disease      Dad's side  . Parkinson's disease Maternal Grandfather   . Colon cancer      mat GGF    History   Social History  . Marital Status: Single    Spouse Name: N/A    Number of Children: 0  . Years of Education: N/A   Occupational History  . 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 Smoking status: Never Smoker   . Smokeless tobacco: Never Used  . Alcohol Use: No     Comment:  2 per month  . Drug Use: No  . Sexual Activity: Yes   Other Topics Concern  . Not on file   Social History Narrative   Lives with parents.   Review of Systems Changes shoes regularly for better support Sleep is so-so     Objective:   Physical Exam  Psychiatric:  Calm and coherent Speech fast at times--but normal for her          Assessment & Plan:

## 2013-11-24 NOTE — Assessment & Plan Note (Signed)
Has been seeing Dr Jefm Bryant Not enough evidence that this is psoriatic arthritis so no remittive Rx Continuing on meloxicam Okay tramadol for prn use--- will approve up to #30 a month  Counseled about this issue all of ~15 minute visit

## 2013-11-24 NOTE — Progress Notes (Signed)
Pre visit review using our clinic review tool, if applicable. No additional management support is needed unless otherwise documented below in the visit note. 

## 2013-12-15 ENCOUNTER — Other Ambulatory Visit: Payer: Self-pay | Admitting: Internal Medicine

## 2013-12-15 NOTE — Telephone Encounter (Signed)
rx called into pharmacy .left message to have patient return my call.

## 2013-12-15 NOTE — Telephone Encounter (Signed)
11/24/13 

## 2013-12-15 NOTE — Telephone Encounter (Signed)
Okay #30 x 0 Remind her that I have approved only #30 per month I won't refill it early again

## 2014-01-05 ENCOUNTER — Other Ambulatory Visit: Payer: Self-pay | Admitting: Internal Medicine

## 2014-01-05 NOTE — Telephone Encounter (Signed)
Okay #30 x 0 

## 2014-01-05 NOTE — Telephone Encounter (Signed)
Electronic refill request, please advise  

## 2014-01-06 NOTE — Telephone Encounter (Signed)
Rx called in to pharmacy. 

## 2014-01-24 ENCOUNTER — Other Ambulatory Visit: Payer: Self-pay | Admitting: Internal Medicine

## 2014-01-24 NOTE — Telephone Encounter (Signed)
Too early

## 2014-01-24 NOTE — Telephone Encounter (Signed)
Last filled 01/06/14, he only gives her 30 a month for prn use, to early? please advise

## 2014-02-14 ENCOUNTER — Other Ambulatory Visit: Payer: Self-pay | Admitting: Internal Medicine

## 2014-02-14 NOTE — Telephone Encounter (Signed)
rx called into pharmacy

## 2014-02-14 NOTE — Telephone Encounter (Signed)
Okay #30 x 0 

## 2014-02-14 NOTE — Telephone Encounter (Signed)
01/06/14 

## 2014-02-23 ENCOUNTER — Encounter: Payer: Self-pay | Admitting: Internal Medicine

## 2014-02-23 ENCOUNTER — Ambulatory Visit (INDEPENDENT_AMBULATORY_CARE_PROVIDER_SITE_OTHER): Payer: PRIVATE HEALTH INSURANCE | Admitting: Internal Medicine

## 2014-02-23 VITALS — BP 140/80 | HR 68 | Temp 97.8°F | Wt 188.0 lb

## 2014-02-23 DIAGNOSIS — F410 Panic disorder [episodic paroxysmal anxiety] without agoraphobia: Secondary | ICD-10-CM

## 2014-02-23 MED ORDER — DULOXETINE HCL 30 MG PO CPEP: 30.0000 mg | ORAL_CAPSULE | Freq: Every day | ORAL | Status: DC

## 2014-02-23 MED ORDER — ALPRAZOLAM 0.25 MG PO TABS: 0.2500 mg | ORAL_TABLET | Freq: Two times a day (BID) | ORAL | Status: DC | PRN

## 2014-02-23 NOTE — Progress Notes (Signed)
Subjective:    Patient ID: Bridget Maldonado, female    DOB: 1980/12/05, 33 y.o.   MRN: [id number]  HPI Having the worst time with her anxiety Struggling for a month or so Heart will "pound out of my chest" Comes up out of nowhere  Has to stop and can't function Everyone will make her mad then Worried about what she will do or say  Will eventually go away but takes a while-- may last hours Feels this is becoming "overwhelming"  Grandmother is sick--calling in hospice Other stress Has been doing more overtime at work  This is similar to past anxiety but much worse  Not depressed that she can tell Still loves job but the overtime is tough Has date for wedding--very excited about this but more stress  Current Outpatient Prescriptions on File Prior to Visit  Medication Sig Dispense Refill  . albuterol (PROVENTIL HFA;VENTOLIN HFA) 108 (90 BASE) MCG/ACT inhaler Inhale 2 puffs into the lungs every 4 (four) hours as needed.        . hyoscyamine (LEVBID) 0.375 MG 12 hr tablet Take 1 tablet (0.375 mg total) by mouth 2 (two) times daily as needed.  30 tablet  0  . ibuprofen (ADVIL,MOTRIN) 800 MG tablet Take 800 mg by mouth daily as needed.      . mometasone (ELOCON) 0.1 % ointment APPLY TWICE DAILY AS NEEDED FOR PSORIASIS  45 g  1  . Norgestimate-Ethinyl Estradiol Triphasic (ORTHO TRI-CYCLEN LO) 0.18/0.215/0.25 MG-25 MCG tab Take 1 tablet by mouth daily.      . traMADol (ULTRAM) 50 MG tablet TAKE 1 TABLET BY MOUTH 3 TIMES A DAY AS NEEDED FOR PAIN  30 tablet  0   No current facility-administered medications on file prior to visit.    Allergies  Allergen Reactions  . Effexor [Venlafaxine Hcl]   . Fluoxetine Hcl   . Telithromycin     Past Medical History  Diagnosis Date  . 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 Chronic headaches   . Psoriasis   . Obese   . 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 Hemorrhoids 08/2007  . IBS (irritable bowel syndrome) 08/2007  . Bipolar disorder   .  Multiple allergies   . Asthma     Past Surgical History  Procedure Laterality Date  . Laparoscopic cholecystectomy  6/09    Dr. Hulen Skains  . Tympanostomy tube placement    . Ear tube removal    . Colonoscopy w/ biopsies    . Esophagogastroduodenoscopy      Family History  Problem Relation Age of Onset  . Breast cancer Mother   . Rheum arthritis Mother   . Asthma Father   . Anxiety disorder Father     Severe, possible bipolar  . Colon polyps Maternal Grandfather   . Irritable bowel syndrome Mother   . Irritable bowel syndrome Maternal Grandmother   . Diabetes      mat. great uncles  . Alzheimer's disease      Dad's side  . Parkinson's disease Maternal Grandfather   . Colon cancer      mat GGF    History   Social History  . Marital Status: Single    Spouse Name: N/A    Number of Children: 0  . Years of Education: N/A   Occupational History  . 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 Smoking status: Never Smoker   . Smokeless tobacco: Never  Used  . Alcohol Use: No     Comment: 2 per month  . Drug Use: No  . Sexual Activity: Yes   Other Topics Concern  . Not on file   Social History Narrative   Lives with parents.   Review of Systems Not sleeping well Some loss of desire to do stuff--even like getting out of bed No clear mania Appetite is okay     Objective:   Physical Exam  Constitutional: She appears well-developed and well-nourished. No distress.  Psychiatric:  Slightly pressured speech No depression Normal appearance No suicidal ideation or thoughts of death No hallucinations or delusions          Assessment & Plan:

## 2014-02-23 NOTE — Progress Notes (Signed)
Pre visit review using our clinic review tool, if applicable. No additional management support is needed unless otherwise documented below in the visit note. 

## 2014-02-23 NOTE — Assessment & Plan Note (Signed)
Chronic lower level anxiety now much worse Now having sig panic spells Heart and lung exam normal--no evidence of true pathology there  Has bipolar edge with irritability but no mania Failed prozac and effexor in past  Will try low dose duloxetine Alprazolam for the panic Early follow up

## 2014-02-28 ENCOUNTER — Encounter: Payer: Self-pay | Admitting: Internal Medicine

## 2014-02-28 ENCOUNTER — Other Ambulatory Visit: Payer: Self-pay

## 2014-02-28 NOTE — Telephone Encounter (Signed)
Pt requesting refill tramadol to [facility]; Tramadol was called to [facility] on 02/14/14; pt did not realize and will ck with pharmacy.pt will call back if needs further assistance.

## 2014-03-09 ENCOUNTER — Ambulatory Visit (INDEPENDENT_AMBULATORY_CARE_PROVIDER_SITE_OTHER): Payer: PRIVATE HEALTH INSURANCE | Admitting: Internal Medicine

## 2014-03-09 ENCOUNTER — Ambulatory Visit (INDEPENDENT_AMBULATORY_CARE_PROVIDER_SITE_OTHER)
Admission: RE | Admit: 2014-03-09 | Discharge: 2014-03-09 | Disposition: A | Discharge status: Home / Self Care | Payer: PRIVATE HEALTH INSURANCE | Source: Ambulatory Visit | Attending: Internal Medicine | Admitting: Internal Medicine

## 2014-03-09 ENCOUNTER — Encounter: Payer: Self-pay | Admitting: Internal Medicine

## 2014-03-09 VITALS — BP 137/102 | HR 107 | Temp 98.6°F | Wt 183.0 lb

## 2014-03-09 DIAGNOSIS — J189 Pneumonia, unspecified organism: Secondary | ICD-10-CM

## 2014-03-09 DIAGNOSIS — J45909 Unspecified asthma, uncomplicated: Secondary | ICD-10-CM

## 2014-03-09 DIAGNOSIS — J4520 Mild intermittent asthma, uncomplicated: Secondary | ICD-10-CM

## 2014-03-09 MED ORDER — HYDROCODONE-HOMATROPINE 5-1.5 MG/5ML PO SYRP: 5.0000 mL | ORAL_SOLUTION | Freq: Every evening | ORAL | Status: DC | PRN

## 2014-03-09 NOTE — Progress Notes (Signed)
Subjective:    Patient ID: Bridget Maldonado, female    DOB: 07-20-1980, 33 y.o.   MRN: [id number]  HPI [facility] 2 days ago Sick for over a week Had been seen by work PA--given z-pak but it didn't help Using otc and tessalon--still not feeling better  Continued to worsen and had sweats at night and day Had fever Appetite was gone  Evaluated at [facility] CXR done-- ?fluid in left lung Rx for levofloxacin 750 but she has had stomach cramps and vomiting on this. Had to stop No stomach problems till she started the levaquin  Cough all night Using the inhaler Miserable all night  Was put on prednisone and is taking this still now Hard to tell what is going on with the duloxetine Still fires up with anger--perhaps somewhat delayed  Current Outpatient Prescriptions on File Prior to Visit  Medication Sig Dispense Refill  . albuterol (PROVENTIL HFA;VENTOLIN HFA) 108 (90 BASE) MCG/ACT inhaler Inhale 2 puffs into the lungs every 4 (four) hours as needed.        . ALPRAZolam (XANAX) 0.25 MG tablet Take 1-2 tablets (0.25-0.5 mg total) by mouth 2 (two) times daily as needed for anxiety.  40 tablet  0  . DULoxetine (CYMBALTA) 30 MG capsule Take 1 capsule (30 mg total) by mouth daily.  30 capsule  3  . hyoscyamine (LEVBID) 0.375 MG 12 hr tablet Take 1 tablet (0.375 mg total) by mouth 2 (two) times daily as needed.  30 tablet  0  . ibuprofen (ADVIL,MOTRIN) 800 MG tablet Take 800 mg by mouth daily as needed.      . mometasone (ELOCON) 0.1 % ointment APPLY TWICE DAILY AS NEEDED FOR PSORIASIS  45 g  1  . Norgestimate-Ethinyl Estradiol Triphasic (ORTHO TRI-CYCLEN LO) 0.18/0.215/0.25 MG-25 MCG tab Take 1 tablet by mouth daily.      . traMADol (ULTRAM) 50 MG tablet TAKE 1 TABLET BY MOUTH 3 TIMES A DAY AS NEEDED FOR PAIN  30 tablet  0   No current facility-administered medications on file prior to visit.    Allergies  Allergen Reactions  . Effexor [Venlafaxine Hcl]   . Fluoxetine Hcl   .  Telithromycin     Past Medical History  Diagnosis Date  . 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 Chronic headaches   . Psoriasis   . Obese   . 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 Hemorrhoids 08/2007  . IBS (irritable bowel syndrome) 08/2007  . Bipolar disorder   . Multiple allergies   . Asthma     Past Surgical History  Procedure Laterality Date  . Laparoscopic cholecystectomy  6/09    Dr. Hulen Skains  . Tympanostomy tube placement    . Ear tube removal    . Colonoscopy w/ biopsies    . Esophagogastroduodenoscopy      Family History  Problem Relation Age of Onset  . Breast cancer Mother   . Rheum arthritis Mother   . Asthma Father   . Anxiety disorder Father     Severe, possible bipolar  . Colon polyps Maternal Grandfather   . Irritable bowel syndrome Mother   . Irritable bowel syndrome Maternal Grandmother   . Diabetes      mat. great uncles  . Alzheimer's disease      Dad's side  . Parkinson's disease Maternal Grandfather   . Colon cancer      mat GGF    History  Social History  . Marital Status: Single    Spouse Name: N/A    Number of Children: 0  . Years of Education: N/A   Occupational History  . 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 Smoking status: Never Smoker   . Smokeless tobacco: Never Used  . Alcohol Use: No     Comment: 2 per month  . Drug Use: No  . Sexual Activity: Yes   Other Topics Concern  . Not on file   Social History Narrative   Lives with parents.   Review of Systems No rash Had some diarrhea from the levaquin    Objective:   Physical Exam  Constitutional: She appears well-developed and well-nourished. No distress.  HENT:  Mouth/Throat: Oropharynx is clear and moist. No oropharyngeal exudate.  Mild nasal congestion No sinus tenderness TMs normal  Neck: Normal range of motion. Neck supple. No thyromegaly present.  Pulmonary/Chest: Effort normal and breath sounds normal. No respiratory distress.  She has no wheezes. She has no rales.   No dullness  Lymphadenopathy:    She has no cervical adenopathy.          Assessment & Plan:

## 2014-03-09 NOTE — Progress Notes (Signed)
Pre visit review using our clinic review tool, if applicable. No additional management support is needed unless otherwise documented below in the visit note. 

## 2014-03-09 NOTE — Assessment & Plan Note (Addendum)
Diagnosed at [facility] get records so repeated CXR---looks okay (though perhaps slightly increased markings at base on left----if abnormal, certainly not lobar pneumonia)  Hold off on any antibiotics since z-pak and levaquin no help. Probably viral.

## 2014-03-09 NOTE — Assessment & Plan Note (Signed)
No obvious exacerbation now but has been on prednisone Has inhaler Will give cough syrup

## 2014-03-10 ENCOUNTER — Other Ambulatory Visit: Payer: Self-pay | Admitting: Internal Medicine

## 2014-03-10 ENCOUNTER — Encounter: Payer: Self-pay | Admitting: Internal Medicine

## 2014-03-10 ENCOUNTER — Other Ambulatory Visit: Payer: Self-pay | Admitting: Family Medicine

## 2014-03-10 NOTE — Telephone Encounter (Signed)
Phoned in to pharmacy per Dr. Damita Dunnings.

## 2014-03-10 NOTE — Telephone Encounter (Signed)
02/14/14 

## 2014-03-14 ENCOUNTER — Encounter: Payer: Self-pay | Admitting: Internal Medicine

## 2014-03-17 ENCOUNTER — Ambulatory Visit: Payer: PRIVATE HEALTH INSURANCE | Admitting: Internal Medicine

## 2014-03-17 DIAGNOSIS — Z0289 Encounter for other administrative examinations: Secondary | ICD-10-CM

## 2014-03-20 ENCOUNTER — Encounter: Payer: Self-pay | Admitting: Internal Medicine

## 2014-03-20 MED ORDER — TRAMADOL HCL 50 MG PO TABS: 50.0000 mg | ORAL_TABLET | Freq: Three times a day (TID) | ORAL | Status: DC | PRN

## 2014-03-20 NOTE — Telephone Encounter (Signed)
Okay to send #30 x 0 additional to her pharmacy. I will not refill this again till ~10/18

## 2014-03-20 NOTE — Addendum Note (Signed)
Addended by: Viviana Simpler I on: 03/20/2014 05:46 PM   Modules accepted: Orders

## 2014-03-22 NOTE — Telephone Encounter (Signed)
[facility] called in on 03/20/2014.

## 2014-03-24 ENCOUNTER — Ambulatory Visit: Payer: PRIVATE HEALTH INSURANCE | Admitting: Internal Medicine

## 2014-03-30 ENCOUNTER — Encounter: Payer: Self-pay | Admitting: Internal Medicine

## 2014-03-30 NOTE — Telephone Encounter (Signed)
Please let her know that I don't think an increase is a good idea. She should avoid taking it daily or she will develop dependence I will approve #60 though

## 2014-03-30 NOTE — Telephone Encounter (Signed)
Okay to refill her alprazolam  Same instructions #60 x 0

## 2014-04-06 ENCOUNTER — Other Ambulatory Visit: Payer: Self-pay

## 2014-04-06 MED ORDER — ALPRAZOLAM 0.25 MG PO TABS: 0.2500 mg | ORAL_TABLET | Freq: Two times a day (BID) | ORAL | Status: DC | PRN

## 2014-04-06 NOTE — Telephone Encounter (Signed)
[facility] Dr[facility] Alla German approval.

## 2014-04-10 ENCOUNTER — Encounter: Payer: Self-pay | Admitting: Internal Medicine

## 2014-04-10 ENCOUNTER — Ambulatory Visit (INDEPENDENT_AMBULATORY_CARE_PROVIDER_SITE_OTHER): Payer: PRIVATE HEALTH INSURANCE | Admitting: Internal Medicine

## 2014-04-10 VITALS — BP 140/90 | HR 113 | Temp 99.0°F | Wt 187.0 lb

## 2014-04-10 DIAGNOSIS — J4520 Mild intermittent asthma, uncomplicated: Secondary | ICD-10-CM

## 2014-04-10 DIAGNOSIS — F410 Panic disorder [episodic paroxysmal anxiety] without agoraphobia: Secondary | ICD-10-CM

## 2014-04-10 MED ORDER — TRAMADOL HCL 50 MG PO TABS: 50.0000 mg | ORAL_TABLET | Freq: Three times a day (TID) | ORAL | Status: DC | PRN

## 2014-04-10 NOTE — Progress Notes (Signed)
Subjective:    Patient ID: Bridget Maldonado, female    DOB: 1980-07-13, 33 y.o.   MRN: [id number]  HPI Better Still gets irritated but not as bad as before Able to brush off the "quick anger" most of the time More like irritations then anger  Does take 2 of the alprazolam when she gets panicky Fast palpitations, tense in hands, etc At most twice a week  Still has some cough--especially at night Temp goes up to 100 at night Still wheezes at night  Current Outpatient Prescriptions on File Prior to Visit  Medication Sig Dispense Refill  . albuterol (PROVENTIL HFA;VENTOLIN HFA) 108 (90 BASE) MCG/ACT inhaler Inhale 2 puffs into the lungs every 4 (four) hours as needed.        . ALPRAZolam (XANAX) 0.25 MG tablet Take 1-2 tablets (0.25-0.5 mg total) by mouth 2 (two) times daily as needed for anxiety.  60 tablet  0  . DULoxetine (CYMBALTA) 30 MG capsule Take 1 capsule (30 mg total) by mouth daily.  30 capsule  3  . hyoscyamine (LEVBID) 0.375 MG 12 hr tablet Take 1 tablet (0.375 mg total) by mouth 2 (two) times daily as needed.  30 tablet  0  . ibuprofen (ADVIL,MOTRIN) 800 MG tablet Take 800 mg by mouth daily as needed.      . mometasone (ELOCON) 0.1 % ointment APPLY TWICE DAILY AS NEEDED FOR PSORIASIS  45 g  1  . Norgestimate-Ethinyl Estradiol Triphasic (ORTHO TRI-CYCLEN LO) 0.18/0.215/0.25 MG-25 MCG tab Take 1 tablet by mouth daily.      . traMADol (ULTRAM) 50 MG tablet Take 1 tablet (50 mg total) by mouth 3 (three) times daily as needed.  30 tablet  0   No current facility-administered medications on file prior to visit.    Allergies  Allergen Reactions  . Effexor [Venlafaxine Hcl]   . Fluoxetine Hcl   . Telithromycin     Past Medical History  Diagnosis Date  . 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 Chronic headaches   . Psoriasis   . Obese   . 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 Hemorrhoids 08/2007  . IBS (irritable bowel syndrome) 08/2007  . Bipolar disorder   . Multiple  allergies   . Asthma     Past Surgical History  Procedure Laterality Date  . Laparoscopic cholecystectomy  6/09    Dr. Hulen Skains  . Tympanostomy tube placement    . Ear tube removal    . Colonoscopy w/ biopsies    . Esophagogastroduodenoscopy      Family History  Problem Relation Age of Onset  . Breast cancer Mother   . Rheum arthritis Mother   . Asthma Father   . Anxiety disorder Father     Severe, possible bipolar  . Colon polyps Maternal Grandfather   . Irritable bowel syndrome Mother   . Irritable bowel syndrome Maternal Grandmother   . Diabetes      mat. great uncles  . Alzheimer's disease      Dad's side  . Parkinson's disease Maternal Grandfather   . Colon cancer      mat GGF    History   Social History  . Marital Status: Single    Spouse Name: N/A    Number of Children: 0  . Years of Education: N/A   Occupational History  . 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 Smoking status: Never Smoker   .  Smokeless tobacco: Never Used  . Alcohol Use: No     Comment: 2 per month  . Drug Use: No  . Sexual Activity: Yes   Other Topics Concern  . Not on file   Social History Narrative   Lives with parents.   Review of Systems Has put her engagement on hold--boyfriend didn't want to wait Relationship has cooled off a little---by her choice    Objective:   Physical Exam  Pulmonary/Chest: Effort normal and breath sounds normal. No respiratory distress. She has no wheezes. She has no rales.  Psychiatric: She has a normal mood and affect. Her behavior is normal.          Assessment & Plan:

## 2014-04-10 NOTE — Progress Notes (Signed)
Pre visit review using our clinic review tool, if applicable. No additional management support is needed unless otherwise documented below in the visit note. 

## 2014-04-10 NOTE — Assessment & Plan Note (Addendum)
Improved I wonder if she was stressed with the sudden engagement and better with cooling a little and taking her time Has the alprazolam for occasional symptoms Continue the cymbalta for now

## 2014-04-10 NOTE — Addendum Note (Signed)
Addended by: Despina Hidden on: 04/10/2014 01:10 PM   Modules accepted: Orders

## 2014-04-10 NOTE — Assessment & Plan Note (Signed)
Seems to have mild residual from recent infection (?atypical) Should fade and get back to symptom free again soon

## 2014-04-24 ENCOUNTER — Other Ambulatory Visit: Payer: Self-pay | Admitting: Internal Medicine

## 2014-04-24 NOTE — Telephone Encounter (Signed)
04/10/2014 

## 2014-04-24 NOTE — Telephone Encounter (Signed)
Okay #30 x 0 Let her know if she continues to need regularly, I will need to see her

## 2014-04-24 NOTE — Telephone Encounter (Signed)
rx called into pharmacy

## 2014-05-10 ENCOUNTER — Other Ambulatory Visit: Payer: Self-pay | Admitting: Internal Medicine

## 2014-05-10 MED ORDER — TRAMADOL HCL 50 MG PO TABS: 50.0000 mg | ORAL_TABLET | Freq: Three times a day (TID) | ORAL | Status: DC | PRN

## 2014-05-10 NOTE — Telephone Encounter (Signed)
rx sent to pharmacy by e-script Sent patient message back thru my-chart, that prescription is ready for pick-up and will be at the front desk.

## 2014-05-10 NOTE — Telephone Encounter (Signed)
Approved: okay #30 x 0 Let her know that if she has prescription for alprazolam (as she does now), that I want her to limit the tramadol to #30 per month as there is increased risk for problems together

## 2014-05-10 NOTE — Telephone Encounter (Signed)
04/24/14  

## 2014-05-24 ENCOUNTER — Other Ambulatory Visit: Payer: Self-pay | Admitting: Internal Medicine

## 2014-05-24 MED ORDER — ALPRAZOLAM 0.25 MG PO TABS: 0.2500 mg | ORAL_TABLET | Freq: Two times a day (BID) | ORAL | Status: DC | PRN

## 2014-05-24 NOTE — Telephone Encounter (Signed)
Approved: #60 x 0 

## 2014-05-24 NOTE — Telephone Encounter (Signed)
04/06/2014 

## 2014-05-24 NOTE — Telephone Encounter (Signed)
rx called into pharmacy

## 2014-05-25 ENCOUNTER — Ambulatory Visit (INDEPENDENT_AMBULATORY_CARE_PROVIDER_SITE_OTHER): Payer: PRIVATE HEALTH INSURANCE | Admitting: Family Medicine

## 2014-05-25 ENCOUNTER — Encounter: Payer: Self-pay | Admitting: Family Medicine

## 2014-05-25 VITALS — BP 140/82 | HR 98 | Temp 98.2°F | Wt 197.0 lb

## 2014-05-25 DIAGNOSIS — J208 Acute bronchitis due to other specified organisms: Secondary | ICD-10-CM

## 2014-05-25 DIAGNOSIS — L0100 Impetigo, unspecified: Secondary | ICD-10-CM

## 2014-05-25 DIAGNOSIS — J45901 Unspecified asthma with (acute) exacerbation: Secondary | ICD-10-CM | POA: Insufficient documentation

## 2014-05-25 DIAGNOSIS — J4521 Mild intermittent asthma with (acute) exacerbation: Secondary | ICD-10-CM

## 2014-05-25 MED ORDER — PREDNISONE 20 MG PO TABS: ORAL_TABLET | ORAL | Status: DC

## 2014-05-25 MED ORDER — HYDROCOD POLST-CHLORPHEN POLST 10-8 MG/5ML PO LQCR: 5.0000 mL | Freq: Every evening | ORAL | Status: DC | PRN

## 2014-05-25 NOTE — Progress Notes (Signed)
Pre visit review using our clinic review tool, if applicable. No additional management support is needed unless otherwise documented below in the visit note. 

## 2014-05-25 NOTE — Progress Notes (Signed)
BP 140/82 mmHg  Pulse 98  Temp(Src) 98.2 F (36.8 C) (Oral)  Wt 197 lb (89.359 kg)  SpO2 96%  LMP 04/30/2014 (Approximate)   CC: cough  Subjective:2    Patient ID: Bridget Maldonado, female    DOB: [birth year], 33 y.o.   MRN: [id number]  HPI: Bridget Maldonado is a 33 y.o. female presenting on 05/25/2014 for Cough   Noticed facial rash over last few months, swollen glands - dx facial staph infection by [facility] over weekend, treated with doxycycline 100mg  10d course and mupirocin cream (currently taking this). Facial rash is slowly improving, has f/u appt with derm next week.   Cough started over weekend, lost voice 4 days ago, cough worsening. Productive cough with dyspnea and wheezing. + malaise, head and chest congestion. Fever Monday to 100. H/o chronic headaches. + fatigue  No ear or tooth pain, nausea, abd pain, signficant myalgias.  Treating with dayquil, nyquil.  Patient with mild intermittent asthma controlled with prn albuterol.   Fiance's son with mild cough otherwise no sick contacts at home. No smokers at home.  Relevant past medical, surgical, family and social history reviewed and updated as indicated. Interim medical history since our last visit reviewed. Allergies and medications reviewed and updated.  Current Outpatient Prescriptions on File Prior to Visit  Medication Sig  . albuterol (PROVENTIL HFA;VENTOLIN HFA) 108 (90 BASE) MCG/ACT inhaler Inhale 2 puffs into the lungs every 4 (four) hours as needed.    . ALPRAZolam (XANAX) 0.25 MG tablet Take 1-2 tablets (0.25-0.5 mg total) by mouth 2 (two) times daily as needed for anxiety.  . DULoxetine (CYMBALTA) 30 MG capsule Take 1 capsule (30 mg total) by mouth daily.  . hyoscyamine (LEVBID) 0.375 MG 12 hr tablet Take 1 tablet (0.375 mg total) by mouth 2 (two) times daily as needed.  Marland Kitchen ibuprofen (ADVIL,MOTRIN) 800 MG tablet Take 800 mg by mouth daily as needed.  . mometasone (ELOCON) 0.1 % ointment APPLY TWICE DAILY AS NEEDED  FOR PSORIASIS  . Norgestimate-Ethinyl Estradiol Triphasic (ORTHO TRI-CYCLEN LO) 0.18/0.215/0.25 MG-25 MCG tab Take 1 tablet by mouth daily.  . traMADol (ULTRAM) 50 MG tablet Take 1 tablet (50 mg total) by mouth 3 (three) times daily as needed.   No current facility-administered medications on file prior to visit.    Review of Systems Per HPI unless specifically indicated above     Objective:    BP 140/82 mmHg  Pulse 98  Temp(Src) 98.2 F (36.8 C) (Oral)  Wt 197 lb (89.359 kg)  SpO2 96%  LMP 04/30/2014 (Approximate)  Wt Readings from Last 3 Encounters:  05/25/14 197 lb (89.359 kg)  04/10/14 187 lb (84.823 kg)  03/09/14 183 lb (83.008 kg)    Physical Exam  Constitutional: She appears well-developed and well-nourished. No distress.  HENT:  Head: Normocephalic and atraumatic.  Right Ear: Hearing, tympanic membrane, external ear and ear canal normal.  Left Ear: Hearing, tympanic membrane, external ear and ear canal normal.  Nose: No mucosal edema or rhinorrhea. Right sinus exhibits no maxillary sinus tenderness and no frontal sinus tenderness. Left sinus exhibits no maxillary sinus tenderness and no frontal sinus tenderness.  Mouth/Throat: Uvula is midline, oropharynx is clear and moist and mucous membranes are normal. No oropharyngeal exudate, posterior oropharyngeal edema, posterior oropharyngeal erythema or tonsillar abscesses.  Eyes: Conjunctivae and EOM are normal. Pupils are equal, round, and reactive to light. No scleral icterus.  Neck: Normal range of motion. Neck supple.  Cardiovascular: Normal rate,  regular rhythm, normal heart sounds and intact distal pulses.   No murmur heard. Pulmonary/Chest: Effort normal. No respiratory distress. She has decreased breath sounds. She has no wheezes. She has no rhonchi. She has no rales.  Mildly tight air movement  Lymphadenopathy:    She has no cervical adenopathy.  Skin: Skin is warm and dry. Rash noted.  Erythematous papular  perioral rash present  Nursing note and vitals reviewed.      Assessment & Plan:   Problem List Items Addressed This Visit    Impetigo    Improving with current treatment. Continue mupirocin and doxy course.    Relevant Medications      mupirocin ointment (BACTROBAN) 2 %   Acute asthma flare - Primary    Anticipate acute viral laryngitis/bronchitis leading to acute asthma flare - treat with scheduled alb inh and prednisone taper. Push fluids and rest. Update if not improving with treatment. Pt agrees with plan.    Relevant Medications      predniSONE (DELTASONE) tablet    Other Visit Diagnoses    Acute viral bronchitis        Relevant Medications       mupirocin ointment (BACTROBAN) 2 %        Follow up plan: Return if symptoms worsen or fail to improve.

## 2014-05-25 NOTE — Assessment & Plan Note (Signed)
Improving with current treatment. Continue mupirocin and doxy course.

## 2014-05-25 NOTE — Patient Instructions (Signed)
Facial rash seems to be improving with current treatment. I think you have viral bronchitis that has led to asthma flare - treat with steroid taper sent to pharmacy, may use tussionex for cough at night time only, and push fluids and rest. Also take albuterol 3 times daily for next 1-2 days  Watch for worsening rash, return of fever >101 or worsening productive cough/shortness of breath. If that happens,please be evaluated again. I hope you start feeling better.

## 2014-05-25 NOTE — Assessment & Plan Note (Signed)
Anticipate acute viral laryngitis/bronchitis leading to acute asthma flare - treat with scheduled alb inh and prednisone taper. Push fluids and rest. Update if not improving with treatment. Pt agrees with plan.

## 2014-06-01 ENCOUNTER — Other Ambulatory Visit: Payer: Self-pay | Admitting: Internal Medicine

## 2014-06-01 MED ORDER — TRAMADOL HCL 50 MG PO TABS: 50.0000 mg | ORAL_TABLET | Freq: Three times a day (TID) | ORAL | Status: DC | PRN

## 2014-06-01 NOTE — Telephone Encounter (Signed)
05/10/14 

## 2014-06-01 NOTE — Telephone Encounter (Signed)
rx called into pharmacy

## 2014-06-01 NOTE — Telephone Encounter (Signed)
Approved: 30 x 0 

## 2014-06-19 ENCOUNTER — Other Ambulatory Visit: Payer: Self-pay | Admitting: Internal Medicine

## 2014-06-19 MED ORDER — TRAMADOL HCL 50 MG PO TABS: 50.0000 mg | ORAL_TABLET | Freq: Three times a day (TID) | ORAL | Status: DC | PRN

## 2014-06-19 NOTE — Telephone Encounter (Signed)
06/01/14  Too soon?

## 2014-06-19 NOTE — Telephone Encounter (Signed)
Approved: #30 x 0 Sooner than I would like but still along prescribing parameters Let her know we need to consider other modalities like physical therapy or a chiropractor if she continues to need medication that often

## 2014-06-27 ENCOUNTER — Telehealth: Payer: Self-pay | Admitting: Internal Medicine

## 2014-06-27 NOTE — Telephone Encounter (Signed)
Patient was in the ER at [facility] for abdominal pain and constipation.  She states that no diagnostic testing was performed.  She has been taking Miralax with no results.  She is offered an appt for tomorrow that she declines, she only wants to come on Friday.  She will come in on Friday and see Alonza Bogus, PA at 8:30

## 2014-06-30 ENCOUNTER — Ambulatory Visit: Payer: PRIVATE HEALTH INSURANCE | Admitting: Gastroenterology

## 2014-07-03 ENCOUNTER — Other Ambulatory Visit: Payer: Self-pay | Admitting: Internal Medicine

## 2014-07-03 MED ORDER — ALPRAZOLAM 0.25 MG PO TABS: 0.2500 mg | ORAL_TABLET | Freq: Two times a day (BID) | ORAL | Status: DC | PRN

## 2014-07-03 NOTE — Telephone Encounter (Signed)
rx called into pharmacy

## 2014-07-03 NOTE — Telephone Encounter (Signed)
Approved: #60 x 0 

## 2014-07-03 NOTE — Telephone Encounter (Signed)
05/24/14  

## 2014-07-31 ENCOUNTER — Other Ambulatory Visit: Payer: Self-pay | Admitting: Internal Medicine

## 2014-07-31 NOTE — Telephone Encounter (Signed)
Approved: 30 x 0 

## 2014-07-31 NOTE — Telephone Encounter (Signed)
See drug warning with Cymbalta. Is it okay to refill?

## 2014-08-01 ENCOUNTER — Encounter: Payer: Self-pay | Admitting: Internal Medicine

## 2014-08-01 ENCOUNTER — Other Ambulatory Visit: Payer: Self-pay | Admitting: Internal Medicine

## 2014-08-01 MED ORDER — TRAMADOL HCL 50 MG PO TABS: 50.0000 mg | ORAL_TABLET | Freq: Three times a day (TID) | ORAL | Status: DC | PRN

## 2014-08-01 NOTE — Telephone Encounter (Signed)
Approved: okay #30 x 0 Already has another request pending

## 2014-08-01 NOTE — Telephone Encounter (Signed)
Rx called to pharmacy as instructed. Patient notified.

## 2014-08-01 NOTE — Telephone Encounter (Signed)
Called to pharmacy as instructed.

## 2014-08-01 NOTE — Telephone Encounter (Signed)
Approved: okay to refill

## 2014-08-17 ENCOUNTER — Emergency Department: Payer: Self-pay | Admitting: Emergency Medicine

## 2014-08-25 LAB — OB RESULTS CONSOLE HGB/HCT, BLOOD
HCT: 42 %
Hemoglobin: 14.1 g/dL

## 2014-08-25 LAB — OB RESULTS CONSOLE RUBELLA ANTIBODY, IGM: Rubella: IMMUNE

## 2014-08-25 LAB — OB RESULTS CONSOLE GC/CHLAMYDIA
Chlamydia: NEGATIVE
GC PROBE AMP, GENITAL: NEGATIVE

## 2014-08-25 LAB — OB RESULTS CONSOLE ABO/RH: RH Type: POSITIVE

## 2014-08-25 LAB — OB RESULTS CONSOLE PLATELET COUNT: Platelets: 238 10*3/uL

## 2014-08-25 LAB — OB RESULTS CONSOLE VARICELLA ZOSTER ANTIBODY, IGG: Varicella: IMMUNE

## 2014-08-25 LAB — OB RESULTS CONSOLE HIV ANTIBODY (ROUTINE TESTING): HIV: NONREACTIVE

## 2014-10-18 DIAGNOSIS — R9431 Abnormal electrocardiogram [ECG] [EKG]: Secondary | ICD-10-CM | POA: Insufficient documentation

## 2014-10-18 DIAGNOSIS — I10 Essential (primary) hypertension: Secondary | ICD-10-CM | POA: Insufficient documentation

## 2014-11-15 ENCOUNTER — Inpatient Hospital Stay: Payer: Medicaid Other

## 2014-11-15 ENCOUNTER — Encounter: Payer: Self-pay | Admitting: Certified Nurse Midwife

## 2014-11-15 ENCOUNTER — Inpatient Hospital Stay
Admission: AD | Admit: 2014-11-15 | Discharge: 2014-11-21 | DRG: 781 | Disposition: A | Discharge status: Home / Self Care | Payer: Medicaid Other | Source: Ambulatory Visit | Attending: Obstetrics and Gynecology | Admitting: Obstetrics and Gynecology

## 2014-11-15 DIAGNOSIS — J45909 Unspecified asthma, uncomplicated: Secondary | ICD-10-CM | POA: Insufficient documentation

## 2014-11-15 DIAGNOSIS — O2302 Infections of kidney in pregnancy, second trimester: Principal | ICD-10-CM

## 2014-11-15 DIAGNOSIS — O10912 Unspecified pre-existing hypertension complicating pregnancy, second trimester: Secondary | ICD-10-CM | POA: Diagnosis present

## 2014-11-15 DIAGNOSIS — O99212 Obesity complicating pregnancy, second trimester: Secondary | ICD-10-CM | POA: Diagnosis present

## 2014-11-15 DIAGNOSIS — Z6834 Body mass index (BMI) 34.0-34.9, adult: Secondary | ICD-10-CM

## 2014-11-15 DIAGNOSIS — Z3A18 18 weeks gestation of pregnancy: Secondary | ICD-10-CM | POA: Diagnosis present

## 2014-11-15 DIAGNOSIS — M549 Dorsalgia, unspecified: Secondary | ICD-10-CM | POA: Diagnosis present

## 2014-11-15 HISTORY — DX: Migraine, unspecified, not intractable, without status migrainosus: G43.909

## 2014-11-15 LAB — CBC WITH DIFFERENTIAL/PLATELET
BASOS ABS: 0 10*3/uL (ref 0–0.1)
BASOS PCT: 0 %
EOS ABS: 0 10*3/uL (ref 0–0.7)
Eosinophils Relative: 0 %
HEMATOCRIT: 37.7 % (ref 35.0–47.0)
Hemoglobin: 13.1 g/dL (ref 12.0–16.0)
LYMPHS ABS: 0.3 10*3/uL — AB (ref 1.0–3.6)
LYMPHS PCT: 3 %
MCH: 30.3 pg (ref 26.0–34.0)
MCHC: 34.7 g/dL (ref 32.0–36.0)
MCV: 87.3 fL (ref 80.0–100.0)
Monocytes Absolute: 0.1 10*3/uL — ABNORMAL LOW (ref 0.2–0.9)
Monocytes Relative: 1 %
Neutro Abs: 9.4 10*3/uL — ABNORMAL HIGH (ref 1.4–6.5)
Neutrophils Relative %: 96 %
Platelets: 162 10*3/uL (ref 150–440)
RBC: 4.32 MIL/uL (ref 3.80–5.20)
RDW: 13.5 % (ref 11.5–14.5)
WBC: 9.9 10*3/uL (ref 3.6–11.0)

## 2014-11-15 LAB — URINALYSIS COMPLETE WITH MICROSCOPIC (ARMC ONLY)
Bilirubin Urine: NEGATIVE
GLUCOSE, UA: NEGATIVE mg/dL
Ketones, ur: NEGATIVE mg/dL
Nitrite: NEGATIVE
PH: 6 (ref 5.0–8.0)
PROTEIN: NEGATIVE mg/dL
SPECIFIC GRAVITY, URINE: 1.012 (ref 1.005–1.030)

## 2014-11-15 LAB — COMPREHENSIVE METABOLIC PANEL
ALT: 32 U/L (ref 14–54)
ANION GAP: 10 (ref 5–15)
AST: 50 U/L — ABNORMAL HIGH (ref 15–41)
Albumin: 2.9 g/dL — ABNORMAL LOW (ref 3.5–5.0)
Alkaline Phosphatase: 119 U/L (ref 38–126)
BUN: 7 mg/dL (ref 6–20)
CHLORIDE: 102 mmol/L (ref 101–111)
CO2: 23 mmol/L (ref 22–32)
Calcium: 8.5 mg/dL — ABNORMAL LOW (ref 8.9–10.3)
Creatinine, Ser: 0.57 mg/dL (ref 0.44–1.00)
GFR calc Af Amer: >60 mL/min (ref >60)
GFR calc non Af Amer: >60 mL/min (ref >60)
GLUCOSE: 92 mg/dL (ref 65–99)
Potassium: 3 mmol/L — ABNORMAL LOW (ref 3.5–5.1)
SODIUM: 135 mmol/L (ref 135–145)
Total Bilirubin: 0.8 mg/dL (ref 0.3–1.2)
Total Protein: 6.3 g/dL — ABNORMAL LOW (ref 6.5–8.1)

## 2014-11-15 MED ORDER — CEFTRIAXONE SODIUM IN DEXTROSE 20 MG/ML IV SOLN
1.0000 g | INTRAVENOUS | Status: DC
Start: 2014-11-15 — End: 2014-11-20
  Administered 2014-11-15 – 2014-11-19 (×5): 1 g via INTRAVENOUS
  Filled 2014-11-15 (×7): qty 50

## 2014-11-15 MED ORDER — CALCIUM CARBONATE ANTACID 500 MG PO CHEW
2.0000 | CHEWABLE_TABLET | ORAL | Status: DC | PRN
  Administered 2014-11-16 – 2014-11-17 (×3): 400 mg via ORAL
  Filled 2014-11-15 (×3): qty 2

## 2014-11-15 MED ORDER — POTASSIUM CHLORIDE CRYS ER 20 MEQ PO TBCR
40.0000 meq | EXTENDED_RELEASE_TABLET | Freq: Once | ORAL | Status: AC
  Administered 2014-11-15: 40 meq via ORAL
  Filled 2014-11-15: qty 2

## 2014-11-15 MED ORDER — SODIUM CHLORIDE 0.9 % IV SOLN
INTRAVENOUS | Status: DC
  Administered 2014-11-15 – 2014-11-19 (×7): via INTRAVENOUS

## 2014-11-15 MED ORDER — ACETAMINOPHEN 325 MG PO TABS
650.0000 mg | ORAL_TABLET | ORAL | Status: DC | PRN
  Administered 2014-11-15 – 2014-11-19 (×13): 650 mg via ORAL
  Filled 2014-11-15 (×13): qty 2

## 2014-11-15 MED ORDER — OXYCODONE HCL 5 MG PO TABS
5.0000 mg | ORAL_TABLET | ORAL | Status: DC | PRN
  Administered 2014-11-15 – 2014-11-19 (×18): 10 mg via ORAL
  Filled 2014-11-15: qty 2
  Filled 2014-11-15: qty 10
  Filled 2014-11-15 (×16): qty 2

## 2014-11-15 MED ORDER — PRENATAL MULTIVITAMIN CH
1.0000 | ORAL_TABLET | Freq: Every day | ORAL | Status: DC
  Administered 2014-11-17 – 2014-11-20 (×4): 1 via ORAL
  Filled 2014-11-15 (×4): qty 1

## 2014-11-15 NOTE — H&P (Signed)
ANTEPARTUM ADMISSION HISTORY AND PHYSICAL NOTE   History of Present Illness: Bridget Maldonado is a 34 y.o. G2P0010 at [redacted]w[redacted]d admitted for pyelonephritis.  Pt reports that she started having back pain a few days ago bilaterally. Last night she reported waking up at 1 am shivering and had a fever of 103. She reports some dysuria during the end of her void, flank pain, chills, headache, body aches, and fever. She denies urgency, odor, and frequency. SHe reports + quickening, denies vb, lof or ctx.   Her pregnancy is complicated by obesity with a BMI of 34, abnormal EKG with a cards consults and normal EKG during consult, elevated AST (41), and chronic HTN. She reports stopping her labetalol and has not had any elevated blood pressures in the last few weeks.   Patient Active Problem List   Diagnosis Date Noted  . Pyelonephritis affecting pregnancy in second trimester, antepartum 11/15/2014  . Chronic hypertension 11/15/2014  . Abnormal ECG 10/18/2014  . Essential (primary) hypertension 10/18/2014  . Acute asthma flare 05/25/2014  . Arthritis 11/24/2013  . Arthropathia 11/24/2013  . Obstipation 04/12/2013  . CN (constipation) 04/12/2013  . Migraine with status migrainosus 09/08/2012  . 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 Adaptive colitis 06/07/2012  . Mild intermittent asthma 05/26/2011  . Severe anxiety with panic 08/29/2006  . Panic disorder without agoraphobia 08/29/2006    Past Medical History  Diagnosis Date  . 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 Chronic headaches   . Psoriasis   . Obese   . 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 Hemorrhoids 08/2007  . IBS (irritable bowel syndrome) 08/2007  . Bipolar disorder   . Multiple allergies   . Asthma   . Migraines     Past Surgical History  Procedure Laterality Date  . Laparoscopic cholecystectomy  6/09    Dr. Hulen Skains  . Tympanostomy tube placement    . Ear tube removal    . Colonoscopy w/ biopsies     . Esophagogastroduodenoscopy    . Colonoscopy      OB History  Gravida Para Term Preterm AB SAB TAB Ectopic Multiple Living  2    1  1    0    # Outcome Date GA Lbr Len/2nd Weight Sex Delivery Anes PTL Lv  2 Current           1 TAB               History   Social History  . Marital Status: Single    Spouse Name: N/A  . Number of Children: 0  . Years of Education: N/A   Occupational History  . 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 Smoking status: Never Smoker   . Smokeless tobacco: Never Used  . Alcohol Use: No     Comment: 2 per month  . Drug Use: No  . Sexual Activity: Yes   Other Topics Concern  . None   Social History Narrative   Lives with parents.    Family History  Problem Relation Age of Onset  . Breast cancer Mother   . Rheum arthritis Mother   . Asthma Father   . Anxiety disorder Father     Severe, possible bipolar  . Colon polyps Maternal Grandfather   . Irritable bowel syndrome Mother   . Irritable bowel syndrome Maternal Grandmother   . Diabetes      mat. great uncles  .  Alzheimer's disease      Dad's side  . Parkinson's disease Maternal Grandfather   . Colon cancer      mat GGF    Allergies  Allergen Reactions  . Sulfa Antibiotics Nausea And Vomiting    Pt reports getting "violently ill" with sulfa drugs   . Ciprofloxacin Other (See Comments)    Nausea and severe pain   . Effexor [Venlafaxine Hcl] Other (See Comments)    Pt reports sleeping for 2 days straight    Prescriptions prior to admission  Medication Sig Dispense Refill Last Dose  . Prenatal Vit-Fe Fumarate-FA (MULTIVITAMIN-PRENATAL) 27-0.8 MG TABS tablet Take 1 tablet by mouth daily at 12 noon.   11/15/2014 at Unknown time    Review of Systems - General ROS: positive for  - chills, fatigue, fever and night sweats Respiratory ROS: no cough, shortness of breath, or wheezing Cardiovascular ROS: no chest pain or dyspnea on exertion Genito-Urinary  ROS: positive for - dysuria Musculoskeletal ROS: positive for - muscle pain and back pain   Vitals:  BP 157/100 mmHg  Pulse 145  Temp(Src) 102.8 F (39.3 C) (Oral)  Resp 24  SpO2 98%  LMP 07/06/2014 Physical Examination: CONSTITUTIONAL: Well-developed, well-nourished female in mild distress due to back pain. Pt writhing around in the bed. Pt verbally asking for pain medication. NECK: Normal range of motion, supple, no masses SKIN: Skin is warm and damp. No rash noted.  diaphoretic. No erythema. No pallor. [place]: Alert and oriented to person, place, and time. Normal reflexes, muscle tone coordination. No cranial nerve deficit noted. PSYCHIATRIC: Normal mood and affect. Normal behavior. Normal judgment and thought content. CARDIOVASCULAR: Normal heart rate noted, regular rhythm RESPIRATORY: Effort and breath sounds normal, no problems with respiration noted ABDOMEN: Soft, nontender, nondistended, gravid. MUSCULOSKELETAL: Normal range of motion. No edema and no tenderness. 2+ distal pulses. +CVAT bilaterally   Fetal doppler: 180   Labs:  CBC and CMP pending   Imaging Studies:  Renal US ordered  Assessment and Plan: Patient Active Problem List   Diagnosis Date Noted  . Pyelonephritis affecting pregnancy in second trimester, antepartum 11/15/2014  . Chronic hypertension 11/15/2014  . Abnormal ECG 10/18/2014  . Essential (primary) hypertension 10/18/2014  . Acute asthma flare 05/25/2014  . Arthritis 11/24/2013  . Arthropathia 11/24/2013  . Obstipation 04/12/2013  . CN (constipation) 04/12/2013  . Migraine with status migrainosus 09/08/2012  . 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 Adaptive colitis 06/07/2012  . Mild intermittent asthma 05/26/2011  . Severe anxiety with panic 08/29/2006  . Panic disorder without agoraphobia 08/29/2006   Admit to mother baby for pyelonephritis IVF Rocephin Q 24 UA and urine culture CBC and CMP Renal US for  CVAT bilaterally General diet Fetal dopper Q8 Tylenol and oxycodone for fever and pain  If BP remains elevated after pain is under control, restart labetalol    Verita Schneiders, MD, FACOG Attending [place], [facility]

## 2014-11-16 MED ORDER — HYDROMORPHONE HCL 1 MG/ML IJ SOLN
1.0000 mg | Freq: Once | INTRAMUSCULAR | Status: AC
  Administered 2014-11-16: 1 mg via INTRAVENOUS
  Filled 2014-11-16: qty 1

## 2014-11-16 NOTE — Progress Notes (Signed)
Patient ID: Bridget Maldonado, female   DOB: 1980-10-10, 34 y.o.   MRN: [id number]  Daily Antepartum Note  Admission Date: 11/15/2014 Current Date: 11/16/2014  Bridget Maldonado is a 34 y.o. G2P0010 @ [redacted]w[redacted]d.  Patient Active Problem List   Diagnosis Date Noted  . Pyelonephritis affecting pregnancy in second trimester, antepartum 11/15/2014  . Chronic hypertension 11/15/2014  . Abnormal ECG 10/18/2014  . Essential (primary) hypertension 10/18/2014  . Acute asthma flare 05/25/2014  . Arthritis 11/24/2013  . Arthropathia 11/24/2013  . Obstipation 04/12/2013  . CN (constipation) 04/12/2013  . Migraine with status migrainosus 09/08/2012  . 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 Adaptive colitis 06/07/2012  . Mild intermittent asthma 05/26/2011  . Severe anxiety with panic 08/29/2006  . Panic disorder without agoraphobia 08/29/2006    Subjective:  Patient feeling a little better.  She still has back pain. She has less shaking and chills than yesterday.  Denies trouble breathing and chest pain. Still notes occasional fetal quickening  Objective:   Filed Vitals:   11/16/14 0925  BP:   Pulse:   Temp: 97.9 F (36.6 C)  Resp:    Temp:  [97.6 F (36.4 C)-102.8 F (39.3 C)] 97.9 F (36.6 C) (05/26 0925) Pulse Rate:  [81-145] 109 (05/26 0817) Resp:  [18-24] 18 (05/26 0817) BP: (108-157)/(72-101) 142/97 mmHg (05/26 0817) SpO2:  [98 %-100 %] 100 % (05/26 0817) Weight:  [180 lb (81.647 kg)] 180 lb (81.647 kg) (05/25 1930) Temp (24hrs), Avg:99.3 F (37.4 C), Min:97.6 F (36.4 C), Max:102.8 F (39.3 C)   Intake/Output Summary (Last 24 hours) at 11/16/14 1009 Last data filed at 11/16/14 0930  [place] per 24 hour  Intake 1143.75 ml  Output   1750 ml  Net -606.25 ml     Current Vital Signs 24h Vital Sign Ranges  T 97.9 F (36.6 C) Temp  Avg: 99.3 F (37.4 C)  Min: 97.6 F (36.4 C)  Max: 102.8 F (39.3 C)  BP (!) 142/97 mmHg (nurse Gregary Signs notified) BP   Min: 108/76  Max: 157/100  HR (!) 109 Pulse  Avg: 110.3  Min: 81  Max: 145  RR 18 Resp  Avg: 20.4  Min: 18  Max: 24  SaO2 100 % Not Delivered SpO2  Avg: 99.2 %  Min: 98 %  Max: 100 %       24 Hour I/O Current Shift I/O  Time Ins Outs 05/25 0701 - 05/26 0700 In: 1143.8 [I.V.:1093.8] Out: 1150 [Urine:1150] 05/26 0701 - 05/26 1900 In: -  Out: 600 [Urine:600]    Physical exam: General: Well nourished, well developed female in no acute distress. Abdomen: gravid nontender Back: still with bilateral CVAT, mildly improved from yesterday Cardiovascular: RRR, no MRGs Respiratory: CTAB Extremities: no clubbing, cyanosis or edema Skin: Warm and dry.   Medications: Current Facility-Administered Medications  Medication Dose Route Frequency Provider Last Rate Last Dose  . 0.9 %  sodium chloride infusion   Intravenous Continuous Courtney Subudhi, CNM 125 mL/hr at 11/16/14 0400    . acetaminophen (TYLENOL) tablet 650 mg  650 mg Oral Q4H PRN Louisa Second, CNM   650 mg at 11/16/14 0807  . calcium carbonate (TUMS - dosed in mg elemental calcium) chewable tablet 400 mg of elemental calcium  2 tablet Oral Q4H PRN Louisa Second, CNM      . cefTRIAXone (ROCEPHIN) 1 g in dextrose 5 % 50 mL IVPB - Premix  1 g Intravenous Q24H Louisa Second, CNM  1 g at 11/15/14 2100  . oxyCODONE (Oxy IR/ROXICODONE) immediate release tablet 5-10 mg  5-10 mg Oral Q4H PRN Louisa Second, CNM   10 mg at 11/16/14 0807  . prenatal multivitamin tablet 1 tablet  1 tablet Oral Q1200 Louisa Second, CNM        Labs:  No new labs  Radiology: renal ultrasound 11/15/14 no evidence of abscess or hydronephrosis  Assessment & Plan:  34 y.o. G2P0010 at [place] admitted with pyelonephritis, improving *FWB: continue Q 8 hour dopp tones * pyelonephritis:   * continue ceftriaxone q 24h  * pain meds prn  * urine culture pending  * needs to be afebrile 24-48 hours (last temp yesterday evening). *Dispo: pending clinical  improvement. At least another 24 hours.  Will Bonnet, MD, [place] 11/16/2014 10:13 AM

## 2014-11-16 NOTE — Progress Notes (Signed)
Med given 

## 2014-11-16 NOTE — Progress Notes (Signed)
heartburn

## 2014-11-17 LAB — URINE CULTURE: Culture: >=100000

## 2014-11-17 MED ORDER — FAMOTIDINE 20 MG PO TABS
20.0000 mg | ORAL_TABLET | Freq: Two times a day (BID) | ORAL | Status: DC
  Administered 2014-11-17 – 2014-11-21 (×8): 20 mg via ORAL
  Filled 2014-11-17 (×8): qty 1

## 2014-11-17 MED ORDER — PROMETHAZINE HCL 25 MG/ML IJ SOLN
12.5000 mg | Freq: Once | INTRAMUSCULAR | Status: AC | PRN
  Administered 2014-11-17: 12.5 mg via INTRAMUSCULAR
  Filled 2014-11-17: qty 1

## 2014-11-17 NOTE — Progress Notes (Signed)
Temp max 101.9. Tylenol given. Having chills with temp. IV infusing well per protocol. PRN for pain X3. MD notified of break through pain. Voiding qs without difficulty. Spouse at bedside.

## 2014-11-17 NOTE — Progress Notes (Signed)
Initial Nutrition Assessment  DOCUMENTATION CODES:     INTERVENTION:   (Meals and snacks: Cater to pt prefences. Discussed lowfat bland items for pt and small frequent meals.  Medical Nutrition Supplement: will add if unable to meet goals)  NUTRITION DIAGNOSIS:  Inadequate oral intake related to acute illness as evidenced by per patient/family report.    GOAL:  Patient will meet greater than or equal to 90% of their needs    MONITOR:   (Energy intake)  REASON FOR ASSESSMENT:  Malnutrition Screening Tool    ASSESSMENT:  Pt admitted with pyelonephritis, feverish this pm during visit. [redacted] weeks pregnant  Past Medical History  Diagnosis Date  . 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 Chronic headaches   . Psoriasis   . Obese   . 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 Hemorrhoids 08/2007  . IBS (irritable bowel syndrome) 08/2007  . Bipolar disorder   . Multiple allergies   . Asthma   . Migraines     Pt reports tolerated chicken noodle soup and pudding today for lunch. Has been drinking fluids.  Reports heartburn Reports intake prior to admission during first trimester was not good secondary to nausea, vomiting but intake has improved until few days before admission  Height:  Ht Readings from Last 1 Encounters:  11/15/14 5\' 4"  (1.626 m)    Weight:  Wt Readings from Last 1 Encounters:  11/15/14 180 lb (81.647 kg)    Pt reports weight loss during first trimester but feels she has gained some back.  Per last 10 encounter weights, 8% weight loss in 5 months     Wt Readings from Last 10 Encounters:  11/15/14 180 lb (81.647 kg)  05/25/14 197 lb (89.359 kg)  04/10/14 187 lb (84.823 kg)  03/09/14 183 lb (83.008 kg)  02/23/14 188 lb (85.276 kg)  11/24/13 181 lb (82.101 kg)  07/14/13 181 lb 8 oz (82.328 kg)  07/05/13 180 lb (81.647 kg)  04/12/13 182 lb (82.555 kg)  12/28/12 180 lb (81.647 kg)   Nutrition-Focused physical exam completed. Findings are WDL for fat  depletion, muscle depletion, and edema.    BMI:  Body mass index is 30.88 kg/(m^2).  Estimated Nutritional Needs:  Kcal:  (24 kcals/kg prepregnancy wt of 93kg) 2232 kcals/d  Protein:  (1.1-1.2 g/kg) Using adjusted pre-pregnancy wt of 64kg 70-77 g/d  Fluid:  (35-15ml/kg prepregnancy wt of 93kg 3255-3743ml/d  Skin:  Reviewed, no issues  Diet Order:  Diet regular Room service appropriate?: Yes; Fluid consistency:: Thin  EDUCATION NEEDS:  No education needs identified at this time   Intake/Output Summary (Last 24 hours) at 11/17/14 1534 Last data filed at 11/17/14 1210  Gross per 24 hour  Intake   1240 ml  Output      0 ml  Net   1240 ml      MODERATE Care Level Mahari Strahm B. Zenia Resides, [place], [place] (pager)

## 2014-11-18 NOTE — Progress Notes (Signed)
Patient ID: Bridget Maldonado, female   DOB: 1980-11-07, 34 y.o.   MRN: [id number]  Daily Antepartum Note  Admission Date: 11/15/2014 Current Date: 11/18/2014  Bridget Maldonado is a 34 y.o. G2P0010 @ [redacted]w[redacted]d.  Patient Active Problem List   Diagnosis Date Noted  . Pyelonephritis affecting pregnancy in second trimester, antepartum 11/15/2014  . Chronic hypertension 11/15/2014  . Abnormal ECG 10/18/2014  . Essential (primary) hypertension 10/18/2014  . Acute asthma flare 05/25/2014  . Arthritis 11/24/2013  . Arthropathia 11/24/2013  . Obstipation 04/12/2013  . CN (constipation) 04/12/2013  . Migraine with status migrainosus 09/08/2012  . 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 Adaptive colitis 06/07/2012  . Mild intermittent asthma 05/26/2011  . Severe anxiety with panic 08/29/2006  . Panic disorder without agoraphobia 08/29/2006    Subjective:  Patient feeling a little better.  She still has back pain. Bilateral and upper back.  Hurts sometimes w deep breathing.  Denies trouble breathing and chest pain. Still notes occasional fetal quickening  Objective:   Filed Vitals:   11/18/14 1302  BP: 140/89  Pulse: 95  Temp: 97.4 F (36.3 C)  Resp: 16   Temp:  [97.4 F (36.3 C)-101.2 F (38.4 C)] 97.4 F (36.3 C) (05/28 1302) Pulse Rate:  [82-111] 95 (05/28 1302) Resp:  [16-24] 16 (05/28 1302) BP: (124-147)/(77-101) 140/89 mmHg (05/28 1302) SpO2:  [97 %-100 %] 98 % (05/28 1302) Temp (24hrs), Avg:99.1 F (37.3 C), Min:97.4 F (36.3 C), Max:101.2 F (38.4 C)     T max at MN last night  Intake/Output Summary (Last 24 hours) at 11/18/14 1412 Last data filed at 11/18/14 0645  Gross per 24 hour  Intake 2735.5 ml  Output   2000 ml  Net  735.5 ml     Current Vital Signs 24h Vital Sign Ranges  T 97.4 F (36.3 C) Temp  Avg: 99.1 F (37.3 C)  Min: 97.4 F (36.3 C)  Max: 101.2 F (38.4 C)  BP 140/89 mmHg BP  Min: 124/89  Max: 147/101  HR 95 Pulse  Avg: 96   Min: 82  Max: 111  RR 16 Resp  Avg: 18  Min: 16  Max: 24  SaO2 98 % Not Delivered SpO2  Avg: 98.5 %  Min: 97 %  Max: 100 %       24 Hour I/O Current Shift I/O  Time Ins Outs 05/27 0701 - 05/28 0700 In: 3075.5 [P.O.:1780; I.V.:1295.5] Out: 2000 [Urine:2000]      Physical exam: General: Well nourished, well developed female in no acute distress. Abdomen: gravid nontender Back: noCVAT yet  bilateral flank T Cardiovascular: RRR, no MRGs Respiratory: CTAB Extremities: no clubbing, cyanosis or edema Skin: Warm and dry.   Medications: Current Facility-Administered Medications  Medication Dose Route Frequency Provider Last Rate Last Dose  . 0.9 %  sodium chloride infusion   Intravenous Continuous Will Bonnet, MD 50 mL/hr at 11/18/14 0645    . acetaminophen (TYLENOL) tablet 650 mg  650 mg Oral Q4H PRN Louisa Second, CNM   650 mg at 11/18/14 1126  . calcium carbonate (TUMS - dosed in mg elemental calcium) chewable tablet 400 mg of elemental calcium  2 tablet Oral Q4H PRN Louisa Second, CNM   400 mg of elemental calcium at 11/17/14 2019  . cefTRIAXone (ROCEPHIN) 1 g in dextrose 5 % 50 mL IVPB - Premix  1 g Intravenous Q24H Louisa Second, CNM   1 g at 11/17/14 1838  . famotidine (  PEPCID) tablet 20 mg  20 mg Oral BID Aletha Halim, MD   20 mg at 11/18/14 0901  . oxyCODONE (Oxy IR/ROXICODONE) immediate release tablet 5-10 mg  5-10 mg Oral Q4H PRN Louisa Second, CNM   10 mg at 11/18/14 1231  . prenatal multivitamin tablet 1 tablet  1 tablet Oral Q1200 Louisa Second, CNM   1 tablet at 11/18/14 1121   Radiology: renal ultrasound 11/15/14 no evidence of abscess or hydronephrosis  Assessment & Plan:  34 y.o. G2P0010 at [facility] admitted with pyelonephritis, improving *FWB: continue Q 8 hour dopp tones * pyelonephritis:   * continue ceftriaxone q 24h  * pain meds prn  * needs to be afebrile 24-48 hours (last temp yesterday evening). *Dispo: pending clinical improvement.    Barnett Applebaum, MD, [place] 11/18/2014 2:12 PM

## 2014-11-19 ENCOUNTER — Inpatient Hospital Stay: Payer: Medicaid Other

## 2014-11-19 LAB — CBC WITH DIFFERENTIAL/PLATELET
BASOS PCT: 1 %
Basophils Absolute: 0 10*3/uL (ref 0–0.1)
EOS ABS: 0.1 10*3/uL (ref 0–0.7)
Eosinophils Relative: 1 %
HCT: 37.8 % (ref 35.0–47.0)
HEMOGLOBIN: 12.8 g/dL (ref 12.0–16.0)
Lymphocytes Relative: 24 %
Lymphs Abs: 1.5 10*3/uL (ref 1.0–3.6)
MCH: 29.3 pg (ref 26.0–34.0)
MCHC: 33.8 g/dL (ref 32.0–36.0)
MCV: 86.8 fL (ref 80.0–100.0)
MONO ABS: 0.5 10*3/uL (ref 0.2–0.9)
MONOS PCT: 7 %
NEUTROS PCT: 67 %
Neutro Abs: 4.4 10*3/uL (ref 1.4–6.5)
Platelets: 176 10*3/uL (ref 150–440)
RBC: 4.36 MIL/uL (ref 3.80–5.20)
RDW: 13.2 % (ref 11.5–14.5)
WBC: 6.4 10*3/uL (ref 3.6–11.0)

## 2014-11-19 MED ORDER — ZOLPIDEM TARTRATE 5 MG PO TABS
5.0000 mg | ORAL_TABLET | Freq: Once | ORAL | Status: AC
  Administered 2014-11-19: 5 mg via ORAL
  Filled 2014-11-19: qty 1

## 2014-11-19 NOTE — Progress Notes (Signed)
Patient ID: Bridget Maldonado, female   DOB: 13-Nov-1980, 34 y.o.   MRN: [id number]  Daily Antepartum Note  Admission Date: 11/15/2014 Current Date: 11/19/2014  Bridget Maldonado is a 34 y.o. G2P0010 @ [redacted]w[redacted]d.  Patient Active Problem List   Diagnosis Date Noted  . Pyelonephritis affecting pregnancy in second trimester, antepartum 11/15/2014  . Chronic hypertension 11/15/2014  . Abnormal ECG 10/18/2014  . Essential (primary) hypertension 10/18/2014  . Acute asthma flare 05/25/2014  . Arthritis 11/24/2013  . Arthropathia 11/24/2013  . Obstipation 04/12/2013  . CN (constipation) 04/12/2013  . Migraine with status migrainosus 09/08/2012  . 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 Adaptive colitis 06/07/2012  . Mild intermittent asthma 05/26/2011  . Severe anxiety with panic 08/29/2006  . Panic disorder without agoraphobia 08/29/2006    Subjective:  Patient feeling  Better.  Concerned about fever.  She still has mild back pain. Still notes occasional fetal quickening  Objective:   Filed Vitals:   11/19/14 1120  BP: 118/66  Pulse: 87  Temp: 98 F (36.7 C)  Resp: 16   Temp:  [97.4 F (36.3 C)-100.9 F (38.3 C)] 98 F (36.7 C) (05/29 1120) Pulse Rate:  [84-95] 87 (05/29 1120) Resp:  [14-18] 16 (05/29 1120) BP: (109-140)/(65-106) 118/66 mmHg (05/29 1120) SpO2:  [95 %-100 %] 99 % (05/29 1120) Temp (24hrs), Avg:98.4 F (36.9 C), Min:97.4 F (36.3 C), Max:100.9 F (38.3 C)     T max at 100.9 2100 last night  Intake/Output Summary (Last 24 hours) at 11/19/14 1201 Last data filed at 11/19/14 0900  Gross per 24 hour  Intake    440 ml  Output   1700 ml  Net  -1260 ml     Current Vital Signs 24h Vital Sign Ranges  T 98 F (36.7 C) Temp  Avg: 98.4 F (36.9 C)  Min: 97.4 F (36.3 C)  Max: 100.9 F (38.3 C)  BP 118/66 mmHg BP  Min: 109/65  Max: 140/89  HR 87 Pulse  Avg: 89.1  Min: 84  Max: 95  RR 16 Resp  Avg: 15.7  Min: 14  Max: 18  SaO2 99 % Not  Delivered SpO2  Avg: 97.9 %  Min: 95 %  Max: 100 %       24 Hour I/O Current Shift I/O  Time Ins Outs 05/28 0701 - 05/29 0700 In: 400 [I.V.:400] Out: 1700 [Urine:1700] 05/29 0701 - 05/29 1900 In: 240 [P.O.:240] Out: -     Physical exam: General: Well nourished, well developed female in no acute distress. Abdomen: gravid nontender Back: no CVAT and Min bilateral flank T Cardiovascular: RRR, no MRGs Respiratory: CTAB Extremities: no clubbing, cyanosis or edema Skin: Warm and dry.   Medications: Current Facility-Administered Medications  Medication Dose Route Frequency Provider Last Rate Last Dose  . 0.9 %  sodium chloride infusion   Intravenous Continuous Will Bonnet, MD 50 mL/hr at 11/18/14 2215    . acetaminophen (TYLENOL) tablet 650 mg  650 mg Oral Q4H PRN Louisa Second, CNM   650 mg at 11/19/14 0111  . calcium carbonate (TUMS - dosed in mg elemental calcium) chewable tablet 400 mg of elemental calcium  2 tablet Oral Q4H PRN Louisa Second, CNM   400 mg of elemental calcium at 11/17/14 2019  . cefTRIAXone (ROCEPHIN) 1 g in dextrose 5 % 50 mL IVPB - Premix  1 g Intravenous Q24H Courtney Subudhi, CNM   1 g at 11/18/14 1856  . famotidine (  PEPCID) tablet 20 mg  20 mg Oral BID Aletha Halim, MD   20 mg at 11/19/14 1007  . oxyCODONE (Oxy IR/ROXICODONE) immediate release tablet 5-10 mg  5-10 mg Oral Q4H PRN Louisa Second, CNM   10 mg at 11/19/14 0837  . prenatal multivitamin tablet 1 tablet  1 tablet Oral Q1200 Louisa Second, CNM   1 tablet at 11/18/14 1121   Radiology: renal ultrasound 11/15/14 no evidence of abscess or hydronephrosis  Assessment & Plan:  34 y.o. G2P0010 at [place] admitted with pyelonephritis, improving *FWB: reassuring * pyelonephritis:              * WBC 6 today             * Renal repeat US to assess repeating fevers, when all other sx's normal other than mild pain  * continue ceftriaxone q 24h  * pain meds prn  * needs to be afebrile 24-48  hours (last temp yesterday evening). *Dispo: pending clinical improvement.   Barnett Applebaum, MD, [place] 11/19/2014 12:01 PM

## 2014-11-20 MED ORDER — POTASSIUM CHLORIDE CRYS ER 20 MEQ PO TBCR
40.0000 meq | EXTENDED_RELEASE_TABLET | Freq: Two times a day (BID) | ORAL | Status: DC
  Administered 2014-11-20 (×2): 40 meq via ORAL
  Filled 2014-11-20 (×2): qty 2

## 2014-11-20 MED ORDER — OXYCODONE HCL 5 MG PO TABS
5.0000 mg | ORAL_TABLET | Freq: Four times a day (QID) | ORAL | Status: DC | PRN
  Administered 2014-11-20: 5 mg via ORAL
  Filled 2014-11-20: qty 1

## 2014-11-20 MED ORDER — CEPHALEXIN 500 MG PO CAPS
500.0000 mg | ORAL_CAPSULE | Freq: Four times a day (QID) | ORAL | Status: DC
  Administered 2014-11-20 – 2014-11-21 (×5): 500 mg via ORAL
  Filled 2014-11-20 (×5): qty 1

## 2014-11-20 MED ORDER — ZOLPIDEM TARTRATE 5 MG PO TABS
5.0000 mg | ORAL_TABLET | Freq: Once | ORAL | Status: AC
  Administered 2014-11-20: 5 mg via ORAL
  Filled 2014-11-20: qty 1

## 2014-11-20 NOTE — Progress Notes (Addendum)
Daily Antepartum Note  Admission Date: 11/15/2014 Current Date: 11/20/2014  Bridget Maldonado is a 34 y.o. G2P0010 @ [redacted]w[redacted]d, HD#6, admitted for pyelonephritis.  Pregnancy complicated by: Patient Active Problem List   Diagnosis Date Noted  . Pyelonephritis affecting pregnancy in second trimester, antepartum 11/15/2014  . Chronic hypertension 11/15/2014  . Abnormal ECG 10/18/2014  . Essential (primary) hypertension 10/18/2014  . Acute asthma flare 05/25/2014  . Arthritis 11/24/2013  . Arthropathia 11/24/2013  . Obstipation 04/12/2013  . CN (constipation) 04/12/2013  . Migraine with status migrainosus 09/08/2012  . 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 Adaptive colitis 06/07/2012  . Mild intermittent asthma 05/26/2011  . Severe anxiety with panic 08/29/2006  . Panic disorder without agoraphobia 08/29/2006   Overnight/24hr events:  none  Subjective:  Doing well, no systemic s/s or PTL s/s. Back pain much improved  Objective:    Current Vital Signs 24h Vital Sign Ranges  T 98.7 F (37.1 C) Temp  Avg: 98.6 F (37 C)  Min: 98 F (36.7 C)  Max: 98.9 F (37.2 C)  BP 129/79 mmHg BP  Min: 118/66  Max: 147/98  HR 83 Pulse  Avg: 92.2  Min: 83  Max: 107  RR 12 Resp  Avg: 15.5  Min: 12  Max: 18  SaO2 98 % Not Delivered SpO2  Avg: 98.2 %  Min: 95 %  Max: 100 %       24 Hour I/O Current Shift I/O  Time Ins Outs 05/29 0701 - 05/30 0700 In: 2706 [P.O.:240; I.V.:2250] Out: 2550 [Urine:2550]     Physical exam: General: Well nourished, well developed female in no acute distress. Abdomen: gravid, NTTP, ND Cardiovascular: RRR, no MRGs Respiratory: CTAB Extremities: no clubbing, cyanosis or edema Skin: Warm and dry.  Back: no CVAT  Medications: Current Facility-Administered Medications  Medication Dose Route Frequency Provider Last Rate Last Dose  . 0.9 %  sodium chloride infusion   Intravenous Continuous Will Bonnet, MD 50 mL/hr at 11/19/14 1721     . acetaminophen (TYLENOL) tablet 650 mg  650 mg Oral Q4H PRN Louisa Second, CNM   650 mg at 11/19/14 1351  . calcium carbonate (TUMS - dosed in mg elemental calcium) chewable tablet 400 mg of elemental calcium  2 tablet Oral Q4H PRN Louisa Second, CNM   400 mg of elemental calcium at 11/17/14 2019  . cephALEXin (KEFLEX) capsule 500 mg  500 mg Oral 4 times per day Aletha Halim, MD   500 mg at 11/20/14 0814  . famotidine (PEPCID) tablet 20 mg  20 mg Oral BID Aletha Halim, MD   20 mg at 11/19/14 2209  . oxyCODONE (Oxy IR/ROXICODONE) immediate release tablet 5-10 mg  5-10 mg Oral Q4H PRN Louisa Second, CNM   10 mg at 11/19/14 1815  . potassium chloride SA (K-DUR,KLOR-CON) CR tablet 40 mEq  40 mEq Oral BID AC Aletha Halim, MD      . prenatal multivitamin tablet 1 tablet  1 tablet Oral Q1200 Louisa Second, CNM   1 tablet at 11/19/14 1351    Recent Labs Lab 11/15/14 1842 11/19/14 1102  WBC 9.9 6.4  HGB 13.1 12.8  HCT 37.7 37.8  PLT 162 176    Recent Labs Lab 11/15/14 1842  NA 135  K 3.0*  CL 102  CO2 23  BUN 7  CREATININE 0.57  GLUCOSE 92  CALCIUM 8.5*   Radiology: no new imaging -5/29 and 5/25 renal u/s negative  Assessment & Plan:  Pt doing well *IUP: fetal status reassuring; qshift FHR. PNV *ID: switched to keflex x 5 days and pt told of need for ppx tx for rest of pregnancy -s/p rocephin x 5 days *FEN/GI: SLIV, regular diet. Kdur ordered *PPx: OOB ad lib, PPI. *Pain: PRN oxycodone which she's using regularly 10mg . Will try to wean down to 5mg  by changing pain scale. Continue with apap *cHTN: no issues.  *Dispo: likely tomorrow. Will watch on 24hrs of abx.  [facility] [phone]

## 2014-11-21 MED ORDER — OXYCODONE HCL 5 MG PO TABS: 5.0000 mg | ORAL_TABLET | Freq: Four times a day (QID) | ORAL | Status: DC | PRN

## 2014-11-21 MED ORDER — CEPHALEXIN 500 MG PO CAPS: 500.0000 mg | ORAL_CAPSULE | Freq: Four times a day (QID) | ORAL | Status: DC

## 2014-11-21 NOTE — Discharge Summary (Signed)
Antenatal Physician Discharge Summary  Patient ID: Bridget Maldonado MRN: [id number] DOB/AGE: March 06, 1981 34 y.o.  Admit date: 11/15/2014 Discharge date: 11/21/2014  Admission Diagnoses: pyelonephritis in pregnancy (2nd trimester)  Discharge Diagnoses: same  Prenatal Procedures: renal ultrasound  Consults: Neonatology, Maternal Fetal Medicine  Significant Diagnostic Studies:  Results for orders placed or performed during the hospital encounter of 11/15/14 (from the past 168 hour(s))  Urine culture   Collection Time: 11/15/14  6:42 PM  Result Value Ref Range   Specimen Description URINE, CLEAN CATCH    Special Requests NONE    Culture >=100,000 COLONIES/mL ESCHERICHIA COLI    Report Status 11/17/2014 FINAL    Organism ID, Bacteria ESCHERICHIA COLI       Susceptibility   Escherichia coli - MIC*    AMPICILLIN <=2 SENSITIVE Sensitive     CEFTAZIDIME <=1 SENSITIVE Sensitive     CEFAZOLIN <=4 SENSITIVE Sensitive     CEFTRIAXONE <=1 SENSITIVE Sensitive     CIPROFLOXACIN <=0.25 SENSITIVE Sensitive     GENTAMICIN <=1 SENSITIVE Sensitive     IMIPENEM <=0.25 SENSITIVE Sensitive     TRIMETH/SULFA <=20 SENSITIVE Sensitive     CEFOXITIN <=4 SENSITIVE Sensitive     * >=100,000 COLONIES/mL ESCHERICHIA COLI  Urinalysis complete, with microscopic [facility])   Collection Time: 11/15/14  6:42 PM  Result Value Ref Range   Color, Urine AMBER (A) YELLOW   APPearance HAZY (A) CLEAR   Glucose, UA NEGATIVE NEGATIVE mg/dL   Bilirubin Urine NEGATIVE NEGATIVE   Ketones, ur NEGATIVE NEGATIVE mg/dL   Specific Gravity, Urine 1.012 1.005 - 1.030   Hgb urine dipstick 1+ (A) NEGATIVE   pH 6.0 5.0 - 8.0   Protein, ur NEGATIVE NEGATIVE mg/dL   Nitrite NEGATIVE NEGATIVE   Leukocytes, UA 2+ (A) NEGATIVE   RBC / HPF 0-5 0 - 5 RBC/hpf   WBC, UA TOO NUMEROUS TO COUNT 0 - 5 WBC/hpf   Bacteria, UA MANY (A) NONE SEEN   Squamous Epithelial / LPF 0-5 (A) NONE SEEN   Mucous PRESENT   Comprehensive metabolic panel   Collection Time: 11/15/14  6:42 PM  Result Value Ref Range   Sodium 135 135 - 145 mmol/L   Potassium 3.0 (L) 3.5 - 5.1 mmol/L   Chloride 102 101 - 111 mmol/L   CO2 23 22 - 32 mmol/L   Glucose, Bld 92 65 - 99 mg/dL   BUN 7 6 - 20 mg/dL   Creatinine, Ser 0.57 0.44 - 1.00 mg/dL   Calcium 8.5 (L) 8.9 - 10.3 mg/dL   Total Protein 6.3 (L) 6.5 - 8.1 g/dL   Albumin 2.9 (L) 3.5 - 5.0 g/dL   AST 50 (H) 15 - 41 U/L   ALT 32 14 - 54 U/L   Alkaline Phosphatase 119 38 - 126 U/L   Total Bilirubin 0.8 0.3 - 1.2 mg/dL   GFR calc non Af Amer >60 >60 mL/min   GFR calc Af Amer >60 >60 mL/min   Anion gap 10 5 - 15  CBC with Differential/Platelet   Collection Time: 11/15/14  6:42 PM  Result Value Ref Range   WBC 9.9 3.6 - 11.0 K/uL   RBC 4.32 3.80 - 5.20 MIL/uL   Hemoglobin 13.1 12.0 - 16.0 g/dL   HCT 37.7 35.0 - 47.0 %   MCV 87.3 80.0 - 100.0 fL   MCH 30.3 26.0 - 34.0 pg   MCHC 34.7 32.0 - 36.0 g/dL   RDW 13.5 11.5 - 14.5 %  Platelets 162 150 - 440 K/uL   Neutrophils Relative % 96 %   Neutro Abs 9.4 (H) 1.4 - 6.5 K/uL   Lymphocytes Relative 3 %   Lymphs Abs 0.3 (L) 1.0 - 3.6 K/uL   Monocytes Relative 1 %   Monocytes Absolute 0.1 (L) 0.2 - 0.9 K/uL   Eosinophils Relative 0 %   Eosinophils Absolute 0.0 0 - 0.7 K/uL   Basophils Relative 0 %   Basophils Absolute 0.0 0 - 0.1 K/uL  Culture, blood (routine x 2)   Collection Time: 11/17/14 12:40 PM  Result Value Ref Range   Specimen Description BLOOD    Special Requests BLOOD    Culture NO GROWTH 4 DAYS    Report Status PENDING   Culture, blood (routine x 2)   Collection Time: 11/17/14 12:41 PM  Result Value Ref Range   Specimen Description BLOOD    Special Requests BLOOD    Culture NO GROWTH 4 DAYS    Report Status PENDING   CBC with Differential/Platelet   Collection Time: 11/19/14 11:02 AM  Result Value Ref Range   WBC 6.4 3.6 - 11.0 K/uL   RBC 4.36 3.80 - 5.20 MIL/uL   Hemoglobin 12.8 12.0 - 16.0 g/dL   HCT 37.8 35.0 - 47.0 %    MCV 86.8 80.0 - 100.0 fL   MCH 29.3 26.0 - 34.0 pg   MCHC 33.8 32.0 - 36.0 g/dL   RDW 13.2 11.5 - 14.5 %   Platelets 176 150 - 440 K/uL   Neutrophils Relative % 67 %   Neutro Abs 4.4 1.4 - 6.5 K/uL   Lymphocytes Relative 24 %   Lymphs Abs 1.5 1.0 - 3.6 K/uL   Monocytes Relative 7 %   Monocytes Absolute 0.5 0.2 - 0.9 K/uL   Eosinophils Relative 1 %   Eosinophils Absolute 0.1 0 - 0.7 K/uL   Basophils Relative 1 %   Basophils Absolute 0.0 0 - 0.1 K/uL    Treatments: antibiotics: ceftriaxone, keflex  Hospital Course:  This is a 34 y.o. G2P0010 with IUP at [redacted]w[redacted]d admitted for acute pyelonephritis in 2nd trimester of pregnancy. She was admitted with upper abdominal pain, fever.  No leaking of fluid and no bleeding.  She was observed, fetal heart rate monitoring remained reassuring, and she had no signs/symptoms of preterm labor or other maternal-fetal concerns. She was deemed stable for discharge to home with outpatient follow up.  Discharge Exam: BP 138/90 mmHg  Pulse 84  Temp(Src) 98.2 F (36.8 C) (Oral)  Resp 18  Ht 5\' 4"  (1.626 m)  Wt 81.647 kg (180 lb)  BMI 30.88 kg/m2  SpO2 97%  LMP 07/06/2014 General appearance: alert, cooperative and no distress Back: no tenderness to percussion or palpation, symmetric, no curvature. ROM normal. No CVA tenderness. Resp: clear to auscultation bilaterally and normal respiratory effort Cardio: regular rate and rhythm, S1, S2 normal, no murmur, click, rub or gallop Extremities: extremities normal, atraumatic, no cyanosis or edema and Homans sign is negative, no sign of DVT  Discharge Condition: Stable  Disposition: 01-Home or Self Care  Discharge Instructions    Discharge activity:  No Restrictions    Complete by:  As directed      Discharge diet:  No restrictions    Complete by:  As directed      Discharge instructions    Complete by:  As directed   Please take Keflex x 10 more days at home (4x per day). Then will need  one pill each  day for remainder of pregnancy, before bed.            Medication List    TAKE these medications        acetaminophen 500 MG tablet  Commonly known as:  TYLENOL  Take 1,000 mg by mouth every 6 (six) hours as needed for mild pain or headache.     cephALEXin 500 MG capsule  Commonly known as:  KEFLEX  Take 1 capsule (500 mg total) by mouth every 6 (six) hours.     multivitamin-prenatal 27-0.8 MG Tabs tablet  Take 1 tablet by mouth daily.     oxyCODONE 5 MG immediate release tablet  Commonly known as:  Oxy IR/ROXICODONE  Take 1 tablet (5 mg total) by mouth every 6 (six) hours as needed for severe pain (1 tab for moderate pain (4-6) 2 tabs for severe pain (7-10)).           Follow-up Information    Follow up with Aletha Halim, MD. Schedule an appointment as soon as possible for a visit in 1 week.   Specialty:  Obstetrics and Gynecology   Contact information:   [address] [place] [address] [phone]       Signed: Maceo Pro M.D. 11/21/2014, 9:25 AM

## 2014-11-21 NOTE — Progress Notes (Signed)
Discharge order from Dr. Leonides Schanz.  Reviewed d/c instructions and prescriptions with patient and answered any questions.  Patient d/c home.

## 2014-11-22 LAB — CULTURE, BLOOD (ROUTINE X 2)
CULTURE: NO GROWTH
CULTURE: NO GROWTH

## 2014-11-29 ENCOUNTER — Encounter: Payer: PRIVATE HEALTH INSURANCE | Admitting: Internal Medicine

## 2014-11-30 ENCOUNTER — Encounter: Payer: PRIVATE HEALTH INSURANCE | Admitting: Internal Medicine

## 2014-12-01 ENCOUNTER — Encounter: Payer: PRIVATE HEALTH INSURANCE | Admitting: Internal Medicine

## 2014-12-26 ENCOUNTER — Observation Stay
Admission: EM | Admit: 2014-12-26 | Discharge: 2014-12-26 | Payer: PRIVATE HEALTH INSURANCE | Attending: Certified Nurse Midwife | Admitting: Certified Nurse Midwife

## 2014-12-26 ENCOUNTER — Ambulatory Visit (HOSPITAL_COMMUNITY)
Admission: AD | Admit: 2014-12-26 | Discharge: 2014-12-26 | Disposition: A | Discharge status: Home / Self Care | Payer: PRIVATE HEALTH INSURANCE | Source: Other Acute Inpatient Hospital | Attending: Maternal & Fetal Medicine | Admitting: Maternal & Fetal Medicine

## 2014-12-26 DIAGNOSIS — O26892 Other specified pregnancy related conditions, second trimester: Secondary | ICD-10-CM | POA: Diagnosis not present

## 2014-12-26 DIAGNOSIS — N12 Tubulo-interstitial nephritis, not specified as acute or chronic: Secondary | ICD-10-CM | POA: Insufficient documentation

## 2014-12-26 DIAGNOSIS — D696 Thrombocytopenia, unspecified: Secondary | ICD-10-CM | POA: Insufficient documentation

## 2014-12-26 DIAGNOSIS — R030 Elevated blood-pressure reading, without diagnosis of hypertension: Secondary | ICD-10-CM | POA: Insufficient documentation

## 2014-12-26 DIAGNOSIS — M549 Dorsalgia, unspecified: Secondary | ICD-10-CM | POA: Diagnosis present

## 2014-12-26 DIAGNOSIS — Z3490 Encounter for supervision of normal pregnancy, unspecified, unspecified trimester: Secondary | ICD-10-CM

## 2014-12-26 DIAGNOSIS — M546 Pain in thoracic spine: Secondary | ICD-10-CM | POA: Insufficient documentation

## 2014-12-26 DIAGNOSIS — Z3A24 24 weeks gestation of pregnancy: Secondary | ICD-10-CM | POA: Diagnosis not present

## 2014-12-26 DIAGNOSIS — O9989 Other specified diseases and conditions complicating pregnancy, childbirth and the puerperium: Secondary | ICD-10-CM

## 2014-12-26 DIAGNOSIS — O99891 Other specified diseases and conditions complicating pregnancy: Secondary | ICD-10-CM | POA: Diagnosis present

## 2014-12-26 LAB — URINALYSIS COMPLETE WITH MICROSCOPIC (ARMC ONLY)
Bilirubin Urine: NEGATIVE
Glucose, UA: NEGATIVE mg/dL
Nitrite: NEGATIVE
PH: 5 (ref 5.0–8.0)
Protein, ur: 100 mg/dL — AB
Specific Gravity, Urine: 1.03 (ref 1.005–1.030)

## 2014-12-26 LAB — CBC
HCT: 39.8 % (ref 35.0–47.0)
Hemoglobin: 13.4 g/dL (ref 12.0–16.0)
MCH: 29.5 pg (ref 26.0–34.0)
MCHC: 33.7 g/dL (ref 32.0–36.0)
MCV: 87.5 fL (ref 80.0–100.0)
PLATELETS: 97 10*3/uL — AB (ref 150–440)
RBC: 4.54 MIL/uL (ref 3.80–5.20)
RDW: 13.3 % (ref 11.5–14.5)
WBC: 12.8 10*3/uL — AB (ref 3.6–11.0)

## 2014-12-26 LAB — COMPREHENSIVE METABOLIC PANEL
ALK PHOS: 135 U/L — AB (ref 38–126)
ALT: 72 U/L — AB (ref 14–54)
AST: 86 U/L — ABNORMAL HIGH (ref 15–41)
Albumin: 3.1 g/dL — ABNORMAL LOW (ref 3.5–5.0)
Anion gap: 11 (ref 5–15)
BUN: 19 mg/dL (ref 6–20)
CO2: 21 mmol/L — AB (ref 22–32)
Calcium: 8.5 mg/dL — ABNORMAL LOW (ref 8.9–10.3)
Chloride: 105 mmol/L (ref 101–111)
Creatinine, Ser: 0.86 mg/dL (ref 0.44–1.00)
GLUCOSE: 82 mg/dL (ref 65–99)
POTASSIUM: 3.7 mmol/L (ref 3.5–5.1)
SODIUM: 137 mmol/L (ref 135–145)
Total Bilirubin: 0.8 mg/dL (ref 0.3–1.2)
Total Protein: 6.4 g/dL — ABNORMAL LOW (ref 6.5–8.1)

## 2014-12-26 LAB — PROTIME-INR
INR: 0.94
Prothrombin Time: 12.8 seconds (ref 11.4–15.0)

## 2014-12-26 LAB — TYPE AND SCREEN
ABO/RH(D): A POS
Antibody Screen: NEGATIVE

## 2014-12-26 LAB — APTT: aPTT: 31 seconds (ref 24–36)

## 2014-12-26 LAB — PROTEIN / CREATININE RATIO, URINE
Creatinine, Urine: 484 mg/dL
PROTEIN CREATININE RATIO: 0.27 mg/mg{creat} — AB (ref 0.00–0.15)
Total Protein, Urine: 129 mg/dL

## 2014-12-26 LAB — LIPASE, BLOOD: LIPASE: 37 U/L (ref 22–51)

## 2014-12-26 LAB — FIBRINOGEN: Fibrinogen: 468 mg/dL (ref 210–470)

## 2014-12-26 MED ORDER — CEFTRIAXONE SODIUM IN DEXTROSE 20 MG/ML IV SOLN
1.0000 g | Freq: Two times a day (BID) | INTRAVENOUS | Status: DC
  Administered 2014-12-26: 1 g via INTRAVENOUS
  Filled 2014-12-26 (×2): qty 50

## 2014-12-26 MED ORDER — MAGNESIUM SULFATE 40 G IN LACTATED RINGERS - SIMPLE
INTRAVENOUS | Status: AC
  Administered 2014-12-26: 40 g
  Filled 2014-12-26: qty 500

## 2014-12-26 MED ORDER — MAGNESIUM SULFATE BOLUS VIA INFUSION
4.0000 g | Freq: Once | INTRAVENOUS | Status: AC
  Administered 2014-12-26: 4 g via INTRAVENOUS
  Filled 2014-12-26: qty 500

## 2014-12-26 MED ORDER — FENTANYL CITRATE (PF) 100 MCG/2ML IJ SOLN
50.0000 ug | INTRAMUSCULAR | Status: DC | PRN
  Administered 2014-12-26 (×2): 100 ug via INTRAVENOUS

## 2014-12-26 MED ORDER — MAGNESIUM SULFATE 4 GM/100ML IV SOLN
INTRAVENOUS | Status: AC
  Administered 2014-12-26: 4 g
  Filled 2014-12-26: qty 100

## 2014-12-26 MED ORDER — LABETALOL HCL 5 MG/ML IV SOLN
INTRAVENOUS | Status: AC
  Administered 2014-12-26: 20 mg via INTRAVENOUS
  Filled 2014-12-26: qty 4

## 2014-12-26 MED ORDER — BETAMETHASONE SOD PHOS & ACET 6 (3-3) MG/ML IJ SUSP
12.0000 mg | Freq: Once | INTRAMUSCULAR | Status: AC
  Administered 2014-12-26: 12 mg via INTRAMUSCULAR

## 2014-12-26 MED ORDER — HYDRALAZINE HCL 20 MG/ML IJ SOLN
10.0000 mg | Freq: Once | INTRAMUSCULAR | Status: AC
  Administered 2014-12-26: 10 mg via INTRAVENOUS

## 2014-12-26 MED ORDER — MAGNESIUM SULFATE 50 % IJ SOLN
2.0000 g/h | INTRAVENOUS | Status: DC
  Filled 2014-12-26: qty 80

## 2014-12-26 MED ORDER — FENTANYL CITRATE (PF) 100 MCG/2ML IJ SOLN
INTRAMUSCULAR | Status: AC
  Administered 2014-12-26: 100 ug via INTRAVENOUS
  Filled 2014-12-26: qty 2

## 2014-12-26 MED ORDER — LABETALOL HCL 200 MG PO TABS
200.0000 mg | ORAL_TABLET | Freq: Two times a day (BID) | ORAL | Status: DC
  Administered 2014-12-26: 100 mg via ORAL

## 2014-12-26 MED ORDER — BETAMETHASONE SOD PHOS & ACET 6 (3-3) MG/ML IJ SUSP
INTRAMUSCULAR | Status: AC
  Administered 2014-12-26: 12 mg via INTRAMUSCULAR
  Filled 2014-12-26: qty 1

## 2014-12-26 MED ORDER — LABETALOL HCL 100 MG PO TABS
ORAL_TABLET | ORAL | Status: AC
  Administered 2014-12-26: 100 mg
  Filled 2014-12-26: qty 2

## 2014-12-26 MED ORDER — FENTANYL CITRATE (PF) 100 MCG/2ML IJ SOLN
INTRAMUSCULAR | Status: AC
  Filled 2014-12-26: qty 2

## 2014-12-26 MED ORDER — LACTATED RINGERS IV SOLN
INTRAVENOUS | Status: DC
  Administered 2014-12-26: 1000 mL via INTRAVENOUS

## 2014-12-26 MED ORDER — LABETALOL HCL 5 MG/ML IV SOLN
20.0000 mg | INTRAVENOUS | Status: DC | PRN
  Administered 2014-12-26 (×2): 20 mg via INTRAVENOUS

## 2014-12-26 NOTE — Progress Notes (Signed)
S: " I just want to leave. My just very anxious about this whole thing!" O: BP 161/89 after 20+20 of IV labetalol and 10 of apresoline, as well as 50 mcg of Fentanyl Patient sitting straight up in bed, and not willing to lie down PC ratio 270 mgm Clotting studies drawn but pending FHR WNL-appropriate for age [facility] transport team is present and has started a second line Magnesium bolus in and has received first dose of BMZ A: IUP at 24 5/7 weeks with HELLP P: DC-Transfer to Annita Brod, Jaclyn Shaggy, CNM

## 2014-12-26 NOTE — Progress Notes (Signed)
S: Has had a headache off and on which Tylenol has helped. No CP or SOB O: BP initially responded to labetalol and fentanyl, but it is again in the severe range 186/126 BUN/cr= 19//0.86, AST=86, ALT=72 +2 protein on dipstick. PC ratio still pending Alert and responsive DTRs +2 No edema in lower extremities FHR 140  A: Preeclampsia with severe features/ HELLP Possible UTI  P: Discussed case with DR Ilda Basset. Will get PT, PTT, fibrinogen and T&S. Betamethasone Magnesium sulfate  Transfer to tertiary Center.  Called Dr Diamantina Monks at [facility] about transfer and she accepts patient Will give labetalol IV 20 mgm q 10 mgm until BP responds or apresoline if becomes brady cardic Explained POM to patient and need to transfer and she agrees with management.  Dalia Heading, CNM

## 2014-12-26 NOTE — OB Triage Note (Signed)
Sent from office for evaluation of BP and to r/o pyleo

## 2014-12-26 NOTE — H&P (Signed)
C/C: Back pain since 30 June and fever to 102 this Am. "It feels like I have pyelo again." Seen in office and sent over for evaluation also of elevated blood pressure.  HPI: 34 year old G2 P0010 with EDC=04/12/2015 by  LMP 07/06/2014 and a 6 week ultrasound presents at 24 weeks 5 days with c/o lower thoracic back pain, left>right,  radiating around to upper abdomen. Back pain started 30 June and has progressively worsened and she now rates the pain at 10/10. Moving, bending and taking a deep breath increases pain.  Had a fever of 102 this AM, and it has come down with Tylenol. She states her epigastric area is also very tender. Vomited 4 times last night and had two loose stools this AM. Has had urinary frequency but no dysuria. Feels like she is emptying her bladder. Has been on Keflex since her admission for pyelo from 5/25- 5/31. A kidney ultrasound in May was WNL-no stones or hydronephrosis. Had some elevated transaminases with last admission also. Denies a hx of stones or UTIs prior to pregnancy. History remarkable for CHTN. Was started on labetalol in the first trimester but it was discontinued when blood pressures remained in the normotensive to mildly elevated range.  [facility]atal care [facility] also remarkable for [facility] a net weight los this pregnancy of 10#, and early one hr GTT=123  Past medical Hx: asthma, CHTN, migraine, panic disorder with agoraphobia  Meds: Prenatal vitamins and Keflex 500 BID.  Allergies: Sensitivies to Cipro (nausea/vomiting), and Effexor (sedation)  Past Surgical history: Cholecystectomy  Exam:  Patient Vitals for the past 24 hrs:  BP Temp Temp src Pulse Resp  12/26/14 1849 (!) 146/85 mmHg - - 78 -  12/26/14 1839 - - - - 20  12/26/14 1834 (!) 141/83 mmHg - - 83 -  12/26/14 1819 (!) 151/89 mmHg - - 77 -  12/26/14 1812 (!) 146/91 mmHg - - 78 -  12/26/14 1804 (!) 173/116 mmHg - - 92 -  12/26/14 1750 (!) 183/108 mmHg - - 84 -  12/26/14 1734 (!) 170/112 mmHg  - - 78 -  12/26/14 1722 (!) 161/109 mmHg - - 96 -  12/26/14 1700 (!) 161/109 mmHg - - 96 -  12/26/14 1648 (!) 173/102 mmHg 98.2 F (36.8 C) Oral 88 18   Heart: RRR without murmur Lungs : CTA but decreased breath sounds in left base Abd: cephalic on ultrasound. FHR 150, tender upper abd, NT uterus +/-CVAT. Points to mid lower thoracic area where pain is Skin: sunburn, was [place] the beach when pain started  CBC: WBC=12.3, plt=97K Urinalysis    Component Value Date/Time   COLORURINE AMBER* 12/26/2014 1734   APPEARANCEUR CLEAR* 12/26/2014 1734   LABSPEC 1.030 12/26/2014 1734   PHURINE 5.0 12/26/2014 1734   GLUCOSEU NEGATIVE 12/26/2014 1734   HGBUR 1+* 12/26/2014 1734   HGBUR moderate 02/08/2010 1331   BILIRUBINUR NEGATIVE 12/26/2014 1734   BILIRUBINUR negative 03/25/2012 1145   KETONESUR TRACE* 12/26/2014 1734   PROTEINUR 100* 12/26/2014 1734   PROTEINUR negative 03/25/2012 1145   UROBILINOGEN negative 03/25/2012 1145   UROBILINOGEN 0.2 02/08/2010 1331   NITRITE NEGATIVE 12/26/2014 1734   NITRITE negative 03/25/2012 1145   LEUKOCYTESUR 3+* 12/26/2014 1734   Microscopic on urine: WBC:6-30 FHR 150s Toco: no ctxs seen  A/P; IUP at 24 5/7 weeks with possible pyelo  Start IV and IVRocephin  Fentanyl for pain  Urine culture pending Thrombocytopenia and elevated blood pressures; Hx of CHTN  Awaiting other PIH labs  Given labetalol 200 mgm po on arrival-blood pressures have come down with labetalol and analgesic  Monitor blood pressures closely.  Upper abdominal pain?  LFTs and lipase pending  Janely Gullickson, CNM

## 2014-12-27 LAB — ABO/RH: ABO/RH(D): A POS

## 2014-12-29 LAB — URINE CULTURE

## 2015-01-03 ENCOUNTER — Telehealth: Payer: Self-pay

## 2015-01-03 NOTE — Telephone Encounter (Signed)
Pt left v/m; pt is returning call from Dr Silvio Pate; pt has just had baby 3 months early and pts social worker was hoping Dr Silvio Pate could send in rx for alprazolam for possible post partum depression if that were to be come an issue. [facility] [place].Please advise.  Pt request cb at [phone].

## 2015-01-03 NOTE — Telephone Encounter (Signed)
Alprazolam is not a treatment for post partum depression Sorry to hear about the baby--- I should probably see her if she is having serious problems I hope her baby is doing okay---undoubtedly will need to stay in the hospital for a while

## 2015-01-04 NOTE — Telephone Encounter (Signed)
Pt notified of Dr. Alla German recommendations/comments. Pt said she is going to talk to her doctor there ([facility]) and see if they can prescribe something since she is there and if not she will call us back to schedule an appt but is okay for now

## 2015-01-04 NOTE — Telephone Encounter (Signed)
Noted  

## 2015-01-22 ENCOUNTER — Encounter: Payer: Self-pay | Admitting: Emergency Medicine

## 2015-01-22 ENCOUNTER — Ambulatory Visit
Admission: EM | Admit: 2015-01-22 | Discharge: 2015-01-22 | Disposition: A | Discharge status: Home / Self Care | Payer: Medicaid Other | Attending: Internal Medicine | Admitting: Internal Medicine

## 2015-01-22 ENCOUNTER — Ambulatory Visit: Payer: Medicaid Other

## 2015-01-22 DIAGNOSIS — S93401A Sprain of unspecified ligament of right ankle, initial encounter: Secondary | ICD-10-CM | POA: Diagnosis not present

## 2015-01-22 DIAGNOSIS — W19XXXA Unspecified fall, initial encounter: Secondary | ICD-10-CM | POA: Diagnosis not present

## 2015-01-22 DIAGNOSIS — Z79899 Other long term (current) drug therapy: Secondary | ICD-10-CM | POA: Diagnosis not present

## 2015-01-22 DIAGNOSIS — M25571 Pain in right ankle and joints of right foot: Secondary | ICD-10-CM | POA: Diagnosis present

## 2015-01-22 MED ORDER — ACETAMINOPHEN 500 MG PO TABS
1000.0000 mg | ORAL_TABLET | Freq: Once | ORAL | Status: AC
  Administered 2015-01-22: 1000 mg via ORAL

## 2015-01-22 MED ORDER — ACETAMINOPHEN 500 MG PO TABS: 1000.0000 mg | ORAL_TABLET | Freq: Once | ORAL | Status: DC

## 2015-01-22 NOTE — ED Provider Notes (Signed)
CSN: [id number]     Arrival date & time 01/22/15  1224 History   First MD Initiated Contact with Patient 01/22/15 1318     Chief Complaint  Patient presents with  . Ankle Pain   (Consider location/radiation/quality/duration/timing/severity/associated sxs/prior Treatment) HPI Comments: Married caucasian female stepped in hole this am right foot felt pop swelling, pain with weight bearing here for evaluation.  Denied fall, LOC, laceration/abrasion.  Unsure of which way foot twisted.  Denied recent injury right foot/ankle.  Patient is a 34 y.o. female presenting with ankle pain. The history is provided by the patient.  Ankle Pain Location:  Ankle Time since incident:  1 hour Injury: yes   Mechanism of injury comment:  Stepped in hole Ankle location:  R ankle Pain details:    Quality:  Aching and throbbing   Radiates to:  Does not radiate   Severity:  Moderate   Onset quality:  Sudden   Duration:  1 hour   Timing:  Constant   Progression:  Worsening Chronicity:  New Dislocation: no   Foreign body present:  No foreign bodies Prior injury to area:  No Relieved by:  Nothing Worsened by:  Bearing weight, abduction, adduction, activity, extension, flexion and rotation Ineffective treatments:  Rest and elevation Associated symptoms: decreased ROM and swelling   Associated symptoms: no back pain, no fatigue, no fever, no itching, no muscle weakness, no neck pain, no numbness, no stiffness and no tingling   Risk factors: no concern for non-accidental trauma, no frequent fractures, no known bone disorder, no obesity and no recent illness     Past Medical History  Diagnosis Date  . 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 Chronic headaches   . Psoriasis   . Obese   . 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 Hemorrhoids 08/2007  . IBS (irritable bowel syndrome) 08/2007  . Bipolar disorder   . Multiple allergies   . Asthma   . Migraines    Past Surgical History  Procedure Laterality Date  .  Laparoscopic cholecystectomy  6/09    Dr. Hulen Skains  . Tympanostomy tube placement    . Ear tube removal    . Colonoscopy w/ biopsies    . Esophagogastroduodenoscopy    . Colonoscopy    . Cesarean section     Family History  Problem Relation Age of Onset  . Breast cancer Mother   . Rheum arthritis Mother   . Asthma Father   . Anxiety disorder Father     Severe, possible bipolar  . Colon polyps Maternal Grandfather   . Irritable bowel syndrome Mother   . Irritable bowel syndrome Maternal Grandmother   . Diabetes      mat. great uncles  . Alzheimer's disease      Dad's side  . Parkinson's disease Maternal Grandfather   . Colon cancer      mat GGF   History  Substance Use Topics  . Smoking status: Never Smoker   . Smokeless tobacco: Never Used  . Alcohol Use: No     Comment: 2 per month   OB History    Gravida Para Term Preterm AB TAB SAB Ectopic Multiple Living   2    1 1     0     Review of Systems  Constitutional: Negative for fever, chills, diaphoresis, activity change, appetite change and fatigue.  HENT: Negative for congestion, dental problem, drooling, ear discharge and facial swelling.   Eyes: Negative for photophobia,  pain, discharge, redness, itching and visual disturbance.  Respiratory: Negative for cough, shortness of breath and wheezing.   Cardiovascular: Negative for chest pain, palpitations and leg swelling.  Gastrointestinal: Negative for nausea, vomiting, abdominal pain, diarrhea and constipation.  Endocrine: Negative for cold intolerance and heat intolerance.  Genitourinary: Negative for dysuria and hematuria.  Musculoskeletal: Positive for myalgias and joint swelling. Negative for back pain, arthralgias, gait problem, stiffness, neck pain and neck stiffness.  Skin: Negative for itching.  Allergic/Immunologic: Negative for environmental allergies and food allergies.  Neurological: Negative for dizziness, tremors, facial asymmetry, weakness,  light-headedness, numbness and headaches.  Hematological: Negative for adenopathy. Does not bruise/bleed easily.  Psychiatric/Behavioral: Negative for behavioral problems, confusion, sleep disturbance and agitation.    Allergies  Sulfa antibiotics; Ciprofloxacin; and Effexor  Home Medications   Prior to Admission medications   Medication Sig Start Date End Date Taking? Authorizing Provider  NIFEdipine (PROCARDIA-XL/ADALAT CC) 30 MG 24 hr tablet Take 30 mg by mouth daily.   Yes Historical Provider, MD  acetaminophen (TYLENOL) 500 MG tablet Take 2 tablets (1,000 mg total) by mouth once. 01/22/15   Olen Cordial, NP   BP 129/93 mmHg  Pulse 87  Temp(Src) 97.9 F (36.6 C) (Oral)  Resp 16  Ht 5\' 4"  (1.626 m)  Wt 170 lb (77.111 kg)  BMI 29.17 kg/m2  SpO2 100%  LMP 07/06/2014  Breastfeeding? Yes Physical Exam  Constitutional: She is oriented to person, place, and time. Vital signs are normal. She appears well-developed and well-nourished. No distress.  HENT:  Head: Normocephalic and atraumatic.  Right Ear: External ear normal.  Left Ear: External ear normal.  Nose: Nose normal.  Mouth/Throat: Oropharynx is clear and moist. No oropharyngeal exudate.  Eyes: Conjunctivae, EOM and lids are normal. Pupils are equal, round, and reactive to light. Right eye exhibits no discharge. Left eye exhibits no discharge. No scleral icterus.  Neck: Trachea normal and normal range of motion. Neck supple. No tracheal deviation present.  Cardiovascular: Normal rate, regular rhythm, normal heart sounds and intact distal pulses.  Exam reveals no gallop and no friction rub.   No murmur heard. Pulmonary/Chest: Effort normal and breath sounds normal. No stridor. No respiratory distress. She has no wheezes.  Abdominal: Soft. She exhibits no distension.  Musculoskeletal: She exhibits edema and tenderness.       Right knee: Normal.       Left knee: Normal.       Right ankle: She exhibits decreased range of  motion and swelling. She exhibits no ecchymosis, no deformity, no laceration and normal pulse. Tenderness. Lateral malleolus, medial malleolus, AITFL and proximal fibula tenderness found. No CF ligament, no posterior TFL and no head of 5th metatarsal tenderness found. Achilles tendon normal. Achilles tendon exhibits no pain and no defect.       Left ankle: Normal.       Right lower leg: She exhibits tenderness. She exhibits no bony tenderness, no swelling, no edema, no deformity and no laceration.       Left lower leg: Normal.       Right foot: There is decreased range of motion and tenderness. There is no bony tenderness, no swelling, normal capillary refill, no crepitus, no deformity and no laceration.       Left foot: Normal.       Feet:  Neurological: She is alert and oriented to person, place, and time. She has normal reflexes. She exhibits normal muscle tone. Coordination normal.  Skin: Skin is  warm, dry and intact. No abrasion, no bruising, no burn, no ecchymosis, no laceration, no lesion, no petechiae and no rash noted. She is not diaphoretic. No cyanosis or erythema. No pallor. Nails show no clubbing.  Psychiatric: She has a normal mood and affect. Her speech is normal and behavior is normal. Judgment and thought content normal. Cognition and memory are normal.  Nursing note and vitals reviewed.   ED Course  Procedures (including critical care time) Labs Review Labs Reviewed - No data to display  Imaging Review Dg Ankle Complete Right  01/22/2015   CLINICAL DATA:  Right ankle and foot injury this morning, stepped in a hole  EXAM: RIGHT ANKLE - COMPLETE 3+ VIEW  COMPARISON:  04/04/2009  FINDINGS: Three views of the right ankle submitted. No acute fracture or subluxation. There is healed old avulsion fracture lateral aspect of calcaneus. Left Ankle mortise is preserved.  IMPRESSION: No acute fracture or subluxation.  Ankle mortise is preserved.   Electronically Signed   By: Lahoma Crocker M.D.    On: 01/22/2015 14:04   Dg Foot Complete Right  01/22/2015   CLINICAL DATA:  Right foot injury, stepped in a hole  EXAM: RIGHT FOOT COMPLETE - 3+ VIEW  COMPARISON:  None.  FINDINGS: Three views of the right foot submitted. No acute fracture or subluxation. No radiopaque foreign body.  IMPRESSION: Negative.   Electronically Signed   By: Lahoma Crocker M.D.   On: 01/22/2015 14:04  1400 patient notified xray normal/negative given copy of radiology reports.  Pain medication helped along with ice and elevation to decrease discomfort. Discussed crutches use patient refused at this time.  Continue ice and elevation at home today.  Best to rest, gentle AROM only today avoid prolonged dependent position/walking.  Avoid high impact exercises the next week.  Patient verbalized understanding of information/instructions, agreed with plan of care and had no further questions at this time.  MDM   1. Ankle sprain, right, initial encounter    Patient was instructed to rest, ice and elevate the ankle as much as possible.  Activity as tolerated and work on ROM exercises.  Patient is to take tylenol 1000mg  po QID prn next dose in 6 hours as dose given in clinic.  Right ASO ordered/fitted and distributed to patient by RN Lolita Cram.  DIscussed with patient advil/aleve/naproxen/motrin raise her blood pressure but tylenol does not.  Encouraged tylenol only use as needed for pain.  Discussed at risk to reinjure ankle over the next year and to wear supportive footwear/ankle sleeve/ace bandage.  Follow up with PCM if symptoms worsen over next 7-10 days for reimaging and re-evaluation if persists greater than 4 weeks for re-evaluation.  Patient verbalized agreement and understanding of treatment plan.   P2:  Injury Prevention and Fitness.    Olen Cordial, NP 01/22/15 1442

## 2015-01-22 NOTE — ED Notes (Signed)
Pt reports she twisted R ankle, stepped in a hole earlier today. Pt reports she "heard a pop", very painful to bear weight on it. No obvious deformity, but has swelling.

## 2015-01-22 NOTE — Discharge Instructions (Signed)

## 2015-02-12 ENCOUNTER — Encounter: Payer: Self-pay | Admitting: Internal Medicine

## 2015-02-12 MED ORDER — TRAMADOL HCL 50 MG PO TABS: 50.0000 mg | ORAL_TABLET | Freq: Three times a day (TID) | ORAL | Status: DC | PRN

## 2015-02-12 NOTE — Telephone Encounter (Signed)
If she is not nursing-- we can refill #30 x 0 It is not recommended for nursing moms (can go through the milk to the baby) Tell her I say congratulations!

## 2015-02-12 NOTE — Addendum Note (Signed)
Addended by: Despina Hidden on: 02/12/2015 02:17 PM   Modules accepted: Orders

## 2015-02-12 NOTE — Telephone Encounter (Signed)
rx called into pharmacy

## 2015-02-12 NOTE — Addendum Note (Signed)
Addended by: Despina Hidden on: 02/12/2015 12:56 PM   Modules accepted: Medications

## 2015-02-12 NOTE — Telephone Encounter (Signed)
Pt asking for refill of tramadol, which was taking off her list at the hospital and per pt she wasn't taking while she was pregnant.

## 2015-02-22 ENCOUNTER — Encounter: Payer: Self-pay | Admitting: Internal Medicine

## 2015-02-22 NOTE — Telephone Encounter (Signed)
Okay to refill for her #60 x 0 of 0.25mg  As long as she isn't nursing

## 2015-02-23 MED ORDER — ALPRAZOLAM 0.25 MG PO TABS: 0.2500 mg | ORAL_TABLET | Freq: Two times a day (BID) | ORAL | Status: DC | PRN

## 2015-02-23 NOTE — Telephone Encounter (Signed)
rx called into pharmacy

## 2015-03-06 ENCOUNTER — Ambulatory Visit
Admission: EM | Admit: 2015-03-06 | Discharge: 2015-03-06 | Disposition: A | Discharge status: Home / Self Care | Payer: PRIVATE HEALTH INSURANCE | Attending: Family Medicine | Admitting: Family Medicine

## 2015-03-06 ENCOUNTER — Encounter: Payer: Self-pay | Admitting: *Deleted

## 2015-03-06 DIAGNOSIS — N390 Urinary tract infection, site not specified: Secondary | ICD-10-CM | POA: Diagnosis not present

## 2015-03-06 DIAGNOSIS — R109 Unspecified abdominal pain: Secondary | ICD-10-CM | POA: Diagnosis not present

## 2015-03-06 HISTORY — DX: Renal tubulo-interstitial disease, unspecified: N15.9

## 2015-03-06 LAB — URINALYSIS COMPLETE WITH MICROSCOPIC (ARMC ONLY)
Glucose, UA: NEGATIVE mg/dL
Hgb urine dipstick: NEGATIVE
Ketones, ur: NEGATIVE mg/dL
Nitrite: NEGATIVE
Protein, ur: 30 mg/dL — AB
RBC / HPF: NONE SEEN RBC/hpf (ref <3)
Specific Gravity, Urine: >1.03 — ABNORMAL HIGH (ref 1.005–1.030)
pH: 6 (ref 5.0–8.0)

## 2015-03-06 MED ORDER — SULFAMETHOXAZOLE-TRIMETHOPRIM 800-160 MG PO TABS: 1.0000 | ORAL_TABLET | Freq: Two times a day (BID) | ORAL | Status: DC

## 2015-03-06 NOTE — ED Notes (Signed)
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 Delivery date 12-27-2014. Patient's child is presently at [facility]. Patient reports back pain that feels similar to when she had a previous kidney infection.

## 2015-03-06 NOTE — Discharge Instructions (Signed)
Flank Pain °Flank pain is pain in your side. The flank is the area of your side between your upper belly (abdomen) and your back. Pain in this area can be caused by many different things. °HOME CARE °Home care and treatment will depend on the cause of your pain. °· Rest as told by your doctor. °· Drink enough fluids to keep your pee (urine) clear or pale yellow.   °· Only take medicine as told by your doctor. °· Tell your doctor about any changes in your pain. °· Follow up with your doctor. °GET HELP RIGHT AWAY IF:  °· Your pain does not get better with medicine.   °· You have new symptoms or your symptoms get worse. °· Your pain gets worse.   °· You have belly (abdominal) pain.   °· You are short of breath.   °· You always feel sick to your stomach (nauseous).   °· You keep throwing up (vomiting).   °· You have puffiness (swelling) in your belly.   °· You feel light-headed or you pass out (faint).   °· You have blood in your pee. °· You have a fever or lasting symptoms for more than 2-3 days. °· You have a fever and your symptoms suddenly get worse. °MAKE SURE YOU:  °· Understand these instructions. °· Will watch your condition. °· Will get help right away if you are not doing well or get worse. °Document Released: 03/18/2008 Document Revised: 10/24/2013 Document Reviewed: 01/22/2012 °ExitCare® Patient Information ©2015 [employer]. This information is not intended to replace advice given to you by your health care provider. Make sure you discuss any questions you have with your health care provider. ° °Urinary Tract Infection °A urinary tract infection (UTI) can occur any place along the urinary tract. The tract includes the kidneys, ureters, bladder, and urethra. A type of germ called bacteria often causes a UTI. UTIs are often helped with antibiotic medicine.  °HOME CARE  °· If given, take antibiotics as told by your doctor. Finish them even if you start to feel better. °· Drink enough fluids to keep your pee  (urine) clear or pale yellow. °· Avoid tea, drinks with caffeine, and bubbly (carbonated) drinks. °· Pee often. Avoid holding your pee in for a long time. °· Pee before and after having sex (intercourse). °· Wipe from front to back after you poop (bowel movement) if you are a woman. Use each tissue only once. °GET HELP RIGHT AWAY IF:  °· You have back pain. °· You have lower belly (abdominal) pain. °· You have chills. °· You feel sick to your stomach (nauseous). °· You throw up (vomit). °· Your burning or discomfort with peeing does not go away. °· You have a fever. °· Your symptoms are not better in 3 days. °MAKE SURE YOU:  °· Understand these instructions. °· Will watch your condition. °· Will get help right away if you are not doing well or get worse. °Document Released: 11/26/2007 Document Revised: 03/03/2012 Document Reviewed: 01/08/2012 °ExitCare® Patient Information ©2015 [employer]. This information is not intended to replace advice given to you by your health care provider. Make sure you discuss any questions you have with your health care provider. ° °

## 2015-03-06 NOTE — ED Provider Notes (Signed)
CSN: [id number]     Arrival date & time 03/06/15  1215 History   First MD Initiated Contact with Patient 03/06/15 1319     Chief Complaint  Patient presents with  . Back Pain    possible kidney infection   patient is a gravida 1 para 1 who had a premature baby in July. 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 About 2 weeks before that she had a horrible urinary tract infection has not completely sure whether the UTI as were precipitated the full blown presence of the HELPP syndrome. When this happened she had back pain flank pain and some dysuria she is very concerned about that so she came in to be seen and evaluated. She stated yesterday she startedsome flank pain and burning on urination. (Consider location/radiation/quality/duration/timing/severity/associated sxs/prior Treatment) Patient is a 34 y.o. female presenting with back pain. The history is provided by the patient. No language interpreter was used.  Back Pain Location:  Generalized Quality:  Aching Radiates to:  Does not radiate Pain severity:  Moderate Onset quality:  Sudden Duration:  2 days Progression:  Unchanged Context: emotional stress   Context: not MCA, not MVA, not physical stress and not recent injury   Relieved by:  Nothing Ineffective treatments:  None tried Associated symptoms: dysuria   Associated symptoms: no fever and no pelvic pain   Risk factors: recent surgery    Nurse's note reviewed. Past Medical History  Diagnosis Date  . 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 Chronic headaches   . Psoriasis   . Obese   . 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 Hemorrhoids 08/2007  . IBS (irritable bowel syndrome) 08/2007  . Bipolar disorder   . Multiple allergies   . Asthma   . Migraines   . Kidney infection    Past Surgical History  Procedure Laterality Date  . Laparoscopic cholecystectomy  6/09    Dr. Hulen Skains  . Tympanostomy tube placement    . Ear tube  removal    . Colonoscopy w/ biopsies    . Esophagogastroduodenoscopy    . Colonoscopy    . Cesarean section     Family History  Problem Relation Age of Onset  . Breast cancer Mother   . Rheum arthritis Mother   . Asthma Father   . Anxiety disorder Father     Severe, possible bipolar  . Colon polyps Maternal Grandfather   . Irritable bowel syndrome Mother   . Irritable bowel syndrome Maternal Grandmother   . Diabetes      mat. great uncles  . Alzheimer's disease      Dad's side  . Parkinson's disease Maternal Grandfather   . Colon cancer      mat GGF   Social History  Substance Use Topics  . Smoking status: Never Smoker   . Smokeless tobacco: Never Used  . Alcohol Use: No     Comment: 2 per month   OB History    Gravida Para Term Preterm AB TAB SAB Ectopic Multiple Living   2    1 1     0     Review of Systems  Constitutional: Negative for fever.  Genitourinary: Positive for dysuria. Negative for pelvic pain.  Musculoskeletal: Positive for back pain.    Allergies  Sulfa antibiotics; Ciprofloxacin; and Effexor  Home Medications   Prior to Admission medications   Medication Sig Start Date End Date Taking?  Authorizing Provider  acetaminophen (TYLENOL) 500 MG tablet Take 2 tablets (1,000 mg total) by mouth once. 01/22/15  Yes Olen Cordial, NP  ALPRAZolam (XANAX) 0.25 MG tablet Take 1 tablet (0.25 mg total) by mouth 2 (two) times daily as needed for anxiety. 02/23/15  Yes Venia Carbon, MD  NIFEdipine (PROCARDIA-XL/ADALAT CC) 30 MG 24 hr tablet Take 30 mg by mouth daily.   Yes Historical Provider, MD  traMADol (ULTRAM) 50 MG tablet Take 1 tablet (50 mg total) by mouth 3 (three) times daily as needed. 02/12/15  Yes Venia Carbon, MD  sulfamethoxazole-trimethoprim (BACTRIM DS,SEPTRA DS) 800-160 MG per tablet Take 1 tablet by mouth 2 (two) times daily. 03/06/15   Frederich Cha, MD   Meds Ordered and Administered this Visit  Medications - No data to display  BP  135/105 mmHg  Pulse 108  Temp(Src) 99 F (37.2 C) (Oral)  Resp 18  Ht 5\' 4"  (1.626 m)  Wt 175 lb (79.379 kg)  BMI 30.02 kg/m2  SpO2 99%  LMP 07/06/2014  Breastfeeding? No No data found.   Physical Exam  Constitutional: She is oriented to person, place, and time. She appears well-developed.  HENT:  Head: Normocephalic and atraumatic.  Eyes: Pupils are equal, round, and reactive to light.  Neck: Neck supple.  Abdominal: Soft. Normal appearance. There is CVA tenderness.  Musculoskeletal: Normal range of motion.  Neurological: She is oriented to person, place, and time.  Skin: Skin is warm and dry.  Vitals reviewed.   ED Course  Procedures (including critical care time)  Labs Review Labs Reviewed  URINALYSIS COMPLETEWITH MICROSCOPIC ([facility] ONLY) - Abnormal; Notable for the following:    Bilirubin Urine 1+ (*)    Specific Gravity, Urine >1.030 (*)    Protein, ur 30 (*)    Leukocytes, UA TRACE (*)    Squamous Epithelial / LPF TOO NUMEROUS TO COUNT (*)    All other components within normal limits  URINE CULTURE    Imaging Review No results found.   Visual Acuity Review  Right Eye Distance:   Left Eye Distance:   Bilateral Distance:    Right Eye Near:   Left Eye Near:    Bilateral Near:         MDM   1. UTI (lower urinary tract infection)   2. Flank pain     Urinalysis was not that impressive specimen was not of good quality. Unfortunately she did not mention that she is allergic to Cipro will place on Septra DS for a week and will order urine culture and have patient call Friday for the results.  Frederich Cha, MD 03/06/15 [phone]

## 2015-03-12 ENCOUNTER — Other Ambulatory Visit: Payer: Self-pay | Admitting: Internal Medicine

## 2015-03-12 NOTE — Telephone Encounter (Signed)
Approved: okay #[phone] x 0 

## 2015-03-12 NOTE — Telephone Encounter (Signed)
02/12/2015 

## 2015-03-13 MED ORDER — TRAMADOL HCL 50 MG PO TABS: 50.0000 mg | ORAL_TABLET | Freq: Three times a day (TID) | ORAL | Status: DC | PRN

## 2015-03-13 NOTE — Telephone Encounter (Signed)
rx called into pharmacy

## 2015-03-22 ENCOUNTER — Other Ambulatory Visit: Payer: Self-pay | Admitting: Internal Medicine

## 2015-03-22 ENCOUNTER — Encounter: Payer: Self-pay | Admitting: Internal Medicine

## 2015-03-22 MED ORDER — ALPRAZOLAM 0.25 MG PO TABS: 0.2500 mg | ORAL_TABLET | Freq: Two times a day (BID) | ORAL | Status: DC | PRN

## 2015-03-22 NOTE — Telephone Encounter (Signed)
Approved: #60 x 0 

## 2015-03-22 NOTE — Telephone Encounter (Signed)
02/23/2015 

## 2015-03-22 NOTE — Telephone Encounter (Signed)
rx called into pharmacy

## 2015-04-10 ENCOUNTER — Encounter: Payer: Self-pay | Admitting: Internal Medicine

## 2015-04-10 ENCOUNTER — Other Ambulatory Visit: Payer: Self-pay | Admitting: Internal Medicine

## 2015-04-10 NOTE — Telephone Encounter (Signed)
Yes--too soon Will be due next week

## 2015-04-10 NOTE — Telephone Encounter (Signed)
03/22/2015, too soon?

## 2015-04-23 ENCOUNTER — Other Ambulatory Visit: Payer: Self-pay | Admitting: Internal Medicine

## 2015-04-23 MED ORDER — ALPRAZOLAM 0.25 MG PO TABS: 0.2500 mg | ORAL_TABLET | Freq: Two times a day (BID) | ORAL | Status: DC | PRN

## 2015-04-23 NOTE — Telephone Encounter (Signed)
rx called into pharmacy

## 2015-04-23 NOTE — Telephone Encounter (Signed)
03/22/2015 

## 2015-04-23 NOTE — Telephone Encounter (Signed)
Approved: #[phone] x [phone] This will be the last refill till she can schedule a follow up and come to the appt

## 2015-05-14 ENCOUNTER — Encounter: Payer: Self-pay | Admitting: Internal Medicine

## 2015-05-14 ENCOUNTER — Ambulatory Visit (INDEPENDENT_AMBULATORY_CARE_PROVIDER_SITE_OTHER): Payer: Medicaid Other | Admitting: Internal Medicine

## 2015-05-14 VITALS — BP 128/88 | HR 85 | Temp 98.1°F | Wt 178.0 lb

## 2015-05-14 DIAGNOSIS — J069 Acute upper respiratory infection, unspecified: Secondary | ICD-10-CM | POA: Diagnosis not present

## 2015-05-14 DIAGNOSIS — J209 Acute bronchitis, unspecified: Secondary | ICD-10-CM | POA: Insufficient documentation

## 2015-05-14 MED ORDER — HYDROCODONE-HOMATROPINE 5-1.5 MG/5ML PO SYRP: 5.0000 mL | ORAL_SOLUTION | Freq: Every evening | ORAL | Status: DC | PRN

## 2015-05-14 MED ORDER — PREDNISONE 20 MG PO TABS: 40.0000 mg | ORAL_TABLET | Freq: Every day | ORAL | Status: DC

## 2015-05-14 NOTE — Progress Notes (Signed)
Subjective:    Patient ID: Bridget Maldonado, female    DOB: 13-Sep-1980, 34 y.o.   MRN: [id number]  HPI Here due to respiratory symptoms  Had [redacted] week EGA baby at [facility] Has done quite well from respiratory and neurologic standpoint Now home!!  Having a hard time with her asthma Bad cough at night Purulent sputum with cough Head is congested Started 2-3 days ago Trying mucinex and robitussin, sinus stuff, etc Can "feel it" at night  Some wheezing and cough worse at night Using the inhaler more--some help (and hot shower)  No fever No chills or sweats Some SOB with the cough Slight ear pressure--goes along with the head pain  Current Outpatient Prescriptions on File Prior to Visit  Medication Sig Dispense Refill  . acetaminophen (TYLENOL) 500 MG tablet Take 2 tablets (1,000 mg total) by mouth once. 30 tablet 0  . ALPRAZolam (XANAX) 0.25 MG tablet Take 1 tablet (0.25 mg total) by mouth 2 (two) times daily as needed for anxiety. 60 tablet 0  . NIFEdipine (PROCARDIA-XL/ADALAT CC) 30 MG 24 hr tablet Take 30 mg by mouth daily.    . traMADol (ULTRAM) 50 MG tablet Take 1 tablet (50 mg total) by mouth 3 (three) times daily as needed. 30 tablet 0   No current facility-administered medications on file prior to visit.    Allergies  Allergen Reactions  . Sulfa Antibiotics Nausea And Vomiting  . Ciprofloxacin Nausea And Vomiting  . Effexor [Venlafaxine Hcl] Other (See Comments)    Reaction:  Made pt sleep for two days straight.    Past Medical History  Diagnosis Date  . 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 Chronic headaches   . Psoriasis   . Obese   . 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 Hemorrhoids 08/2007  . IBS (irritable bowel syndrome) 08/2007  . Bipolar disorder ([place])   . Multiple allergies   . Asthma   . Migraines   . Kidney infection     Past Surgical History  Procedure Laterality Date  . Laparoscopic cholecystectomy  6/09    Dr. Hulen Skains  . Tympanostomy tube  placement    . Ear tube removal    . Colonoscopy w/ biopsies    . Esophagogastroduodenoscopy    . Colonoscopy    . Cesarean section      Family History  Problem Relation Age of Onset  . Breast cancer Mother   . Rheum arthritis Mother   . Asthma Father   . Anxiety disorder Father     Severe, possible bipolar  . Colon polyps Maternal Grandfather   . Irritable bowel syndrome Mother   . Irritable bowel syndrome Maternal Grandmother   . Diabetes      mat. great uncles  . Alzheimer's disease      Dad's side  . Parkinson's disease Maternal Grandfather   . Colon cancer      mat GGF    Review of Systems Recent pyelonephritis again---left kidney again Still on keflex for this Out of work--- not with baby's dad any more    Objective:   Physical Exam  Constitutional: She appears well-developed and well-nourished. No distress.  freq dry cough  HENT:  No sinus tenderness TMs normal Mild pharyngeal injection Moderate nasal inflammation  Neck: Normal range of motion. Neck supple. No thyromegaly present.  Pulmonary/Chest: Effort normal and breath sounds normal. No respiratory distress. She has no wheezes. She has no rales.  Not tight  Lymphadenopathy:    She has no cervical adenopathy.          Assessment & Plan:

## 2015-05-14 NOTE — Progress Notes (Signed)
Pre visit review using our clinic review tool, if applicable. No additional management support is needed unless otherwise documented below in the visit note. 

## 2015-05-14 NOTE — Assessment & Plan Note (Signed)
Has the same infection that has been in the community Discussed viral etiology (though still on keflex for urine anyway) Will give a few days of prednisone for the nocturnal cough and wheezing Cough syrup for night

## 2015-05-17 ENCOUNTER — Other Ambulatory Visit: Payer: Self-pay | Admitting: Internal Medicine

## 2015-05-18 ENCOUNTER — Encounter: Payer: Self-pay | Admitting: Internal Medicine

## 2015-05-18 ENCOUNTER — Other Ambulatory Visit: Payer: Self-pay | Admitting: Internal Medicine

## 2015-05-19 NOTE — Telephone Encounter (Signed)
Last office visit 05/14/2015.  Last refilled 03/13/2015 for #30 with no refills.  Ok to refill?

## 2015-05-21 MED ORDER — TRAMADOL HCL 50 MG PO TABS: 50.0000 mg | ORAL_TABLET | Freq: Three times a day (TID) | ORAL | Status: DC | PRN

## 2015-05-21 NOTE — Telephone Encounter (Signed)
Approved: 30 x 0 

## 2015-05-21 NOTE — Telephone Encounter (Signed)
Tramadol called to [facility] [address]

## 2015-06-19 ENCOUNTER — Encounter: Payer: Self-pay | Admitting: Internal Medicine

## 2015-06-19 ENCOUNTER — Other Ambulatory Visit: Payer: Self-pay | Admitting: Internal Medicine

## 2015-06-19 NOTE — Telephone Encounter (Signed)
05/21/2015 

## 2015-06-19 NOTE — Telephone Encounter (Signed)
Yes because it was sent twice

## 2015-06-20 ENCOUNTER — Encounter: Payer: Self-pay | Admitting: Internal Medicine

## 2015-06-20 MED ORDER — TRAMADOL HCL 50 MG PO TABS: 50.0000 mg | ORAL_TABLET | Freq: Three times a day (TID) | ORAL | Status: DC | PRN

## 2015-06-20 NOTE — Telephone Encounter (Signed)
rx called into pharmacy

## 2015-06-20 NOTE — Telephone Encounter (Signed)
Approved: 30 x 0 

## 2015-06-24 HISTORY — PX: TUBAL LIGATION: SHX77

## 2015-07-16 ENCOUNTER — Other Ambulatory Visit: Payer: Self-pay | Admitting: Internal Medicine

## 2015-07-16 MED ORDER — TRAMADOL HCL 50 MG PO TABS: 50.0000 mg | ORAL_TABLET | Freq: Three times a day (TID) | ORAL | Status: DC | PRN

## 2015-07-16 NOTE — Telephone Encounter (Signed)
06/20/2015 

## 2015-07-16 NOTE — Telephone Encounter (Signed)
rx called into pharmacy

## 2015-07-16 NOTE — Addendum Note (Signed)
Addended by: Despina Hidden on: 07/16/2015 05:19 PM   Modules accepted: Orders

## 2015-07-16 NOTE — Telephone Encounter (Signed)
Approved: 30 x 0 

## 2015-07-18 ENCOUNTER — Encounter: Payer: Self-pay | Admitting: Internal Medicine

## 2015-08-15 ENCOUNTER — Other Ambulatory Visit: Payer: Self-pay | Admitting: Internal Medicine

## 2015-08-15 MED ORDER — TRAMADOL HCL 50 MG PO TABS: 50.0000 mg | ORAL_TABLET | Freq: Three times a day (TID) | ORAL | Status: DC | PRN

## 2015-08-15 NOTE — Telephone Encounter (Signed)
Tramadol called into [facility] [address] 

## 2015-08-15 NOTE — Telephone Encounter (Signed)
Approved: 30 x 0 

## 2015-08-15 NOTE — Telephone Encounter (Signed)
07/16/15 

## 2015-08-27 ENCOUNTER — Ambulatory Visit (INDEPENDENT_AMBULATORY_CARE_PROVIDER_SITE_OTHER): Payer: Medicaid Other | Admitting: Internal Medicine

## 2015-08-27 ENCOUNTER — Encounter: Payer: Self-pay | Admitting: Internal Medicine

## 2015-08-27 VITALS — BP 130/92 | HR 104 | Temp 98.3°F | Wt 184.0 lb

## 2015-08-27 DIAGNOSIS — J069 Acute upper respiratory infection, unspecified: Secondary | ICD-10-CM | POA: Diagnosis not present

## 2015-08-27 MED ORDER — AZITHROMYCIN 250 MG PO TABS: ORAL_TABLET | ORAL | Status: DC

## 2015-08-27 MED ORDER — HYDROCODONE-HOMATROPINE 5-1.5 MG/5ML PO SYRP: 5.0000 mL | ORAL_SOLUTION | Freq: Every evening | ORAL | Status: DC | PRN

## 2015-08-27 NOTE — Progress Notes (Signed)
Pre visit review using our clinic review tool, if applicable. No additional management support is needed unless otherwise documented below in the visit note. 

## 2015-08-27 NOTE — Patient Instructions (Signed)
Upper Respiratory Infection, Adult Most upper respiratory infections (URIs) are a viral infection of the air passages leading to the lungs. A URI affects the nose, throat, and upper air passages. The most common type of URI is nasopharyngitis and is typically referred to as "the common cold." URIs run their course and usually go away on their own. Most of the time, a URI does not require medical attention, but sometimes a bacterial infection in the upper airways can follow a viral infection. This is called a secondary infection. Sinus and middle ear infections are common types of secondary upper respiratory infections. Bacterial pneumonia can also complicate a URI. A URI can worsen asthma and chronic obstructive pulmonary disease (COPD). Sometimes, these complications can require emergency medical care and may be life threatening.  CAUSES Almost all URIs are caused by viruses. A virus is a type of germ and can spread from one person to another.  RISKS FACTORS You may be at risk for a URI if:   You smoke.   You have chronic heart or lung disease.  You have a weakened defense (immune) system.   You are very young or very old.   You have nasal allergies or asthma.  You work in crowded or poorly ventilated areas.  You work in health care facilities or schools. SIGNS AND SYMPTOMS  Symptoms typically develop 2-3 days after you come in contact with a cold virus. Most viral URIs last 7-10 days. However, viral URIs from the influenza virus (flu virus) can last 14-18 days and are typically more severe. Symptoms may include:   Runny or stuffy (congested) nose.   Sneezing.   Cough.   Sore throat.   Headache.   Fatigue.   Fever.   Loss of appetite.   Pain in your forehead, behind your eyes, and over your cheekbones (sinus pain).  Muscle aches.  DIAGNOSIS  Your health care provider may diagnose a URI by:  Physical exam.  Tests to check that your symptoms are not due to  another condition such as:  Strep throat.  Sinusitis.  Pneumonia.  Asthma. TREATMENT  A URI goes away on its own with time. It cannot be cured with medicines, but medicines may be prescribed or recommended to relieve symptoms. Medicines may help:  Reduce your fever.  Reduce your cough.  Relieve nasal congestion. HOME CARE INSTRUCTIONS   Take medicines only as directed by your health care provider.   Gargle warm saltwater or take cough drops to comfort your throat as directed by your health care provider.  Use a warm mist humidifier or inhale steam from a shower to increase air moisture. This may make it easier to breathe.  Drink enough fluid to keep your urine clear or pale yellow.   Eat soups and other clear broths and maintain good nutrition.   Rest as needed.   Return to work when your temperature has returned to normal or as your health care provider advises. You may need to stay home longer to avoid infecting others. You can also use a face mask and careful hand washing to prevent spread of the virus.  Increase the usage of your inhaler if you have asthma.   Do not use any tobacco products, including cigarettes, chewing tobacco, or electronic cigarettes. If you need help quitting, ask your health care provider. PREVENTION  The best way to protect yourself from getting a cold is to practice good hygiene.   Avoid oral or hand contact with people with cold   symptoms.   Wash your hands often if contact occurs.  There is no clear evidence that vitamin C, vitamin E, echinacea, or exercise reduces the chance of developing a cold. However, it is always recommended to get plenty of rest, exercise, and practice good nutrition.  SEEK MEDICAL CARE IF:   You are getting worse rather than better.   Your symptoms are not controlled by medicine.   You have chills.  You have worsening shortness of breath.  You have brown or red mucus.  You have yellow or brown nasal  discharge.  You have pain in your face, especially when you bend forward.  You have a fever.  You have swollen neck glands.  You have pain while swallowing.  You have white areas in the back of your throat. SEEK IMMEDIATE MEDICAL CARE IF:   You have severe or persistent:  Headache.  Ear pain.  Sinus pain.  Chest pain.  You have chronic lung disease and any of the following:  Wheezing.  Prolonged cough.  Coughing up blood.  A change in your usual mucus.  You have a stiff neck.  You have changes in your:  Vision.  Hearing.  Thinking.  Mood. MAKE SURE YOU:   Understand these instructions.  Will watch your condition.  Will get help right away if you are not doing well or get worse.   This information is not intended to replace advice given to you by your health care provider. Make sure you discuss any questions you have with your health care provider.   Document Released: 12/03/2000 Document Revised: 10/24/2014 Document Reviewed: 09/14/2013 Elsevier Interactive Patient Education 2016 [employer] Inc.  

## 2015-08-27 NOTE — Progress Notes (Signed)
HPI  Pt presents to the clinic today with c/o headache, ear fullness, nasal congestion, runny nose, sore throat and cough. This started 5 days ago. She is not able to blow anything out of her nose. The cough is productive of brown mucous. She denies shortness of breath. She has run low grade fevers and had body aches. She has tried ibuprofen, zyrtec without any relief. She does have a history of allergies and asthma. She has had sick contacts.  Review of Systems      Past Medical History  Diagnosis Date  . 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 Chronic headaches   . Psoriasis   . Obese   . 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 Hemorrhoids 08/2007  . IBS (irritable bowel syndrome) 08/2007  . Bipolar disorder ([place])   . Multiple allergies   . Asthma   . Migraines   . Kidney infection     Family History  Problem Relation Age of Onset  . Breast cancer Mother   . Rheum arthritis Mother   . Asthma Father   . Anxiety disorder Father     Severe, possible bipolar  . Colon polyps Maternal Grandfather   . Irritable bowel syndrome Mother   . Irritable bowel syndrome Maternal Grandmother   . Diabetes      mat. great uncles  . Alzheimer's disease      Dad's side  . Parkinson's disease Maternal Grandfather   . Colon cancer      mat GGF    Social History   Social History  . Marital Status: Single    Spouse Name: N/A  . Number of Children: 1  . Years of Education: N/A   Occupational History  .      Social History Main Topics  . Smoking status: Never Smoker   . Smokeless tobacco: Never Used  . Alcohol Use: No     Comment: 2 per month  . Drug Use: No  . Sexual Activity: Yes   Other Topics Concern  . Not on file   Social History Narrative   Lives with parents.    Allergies  Allergen Reactions  . Sulfa Antibiotics Nausea And Vomiting  . Ciprofloxacin Nausea And Vomiting  . Effexor [Venlafaxine Hcl] Other (See Comments)    Reaction:  Made pt sleep for two days  straight.     Constitutional: Positive headache, fatigue and fever. Denies abrupt weight changes.  HEENT:  Positive runny nose, nasal congestion, sore throat. Denies eye redness, eye pain, pressure behind the eyes, facial pain, ear pain, ringing in the ears, wax buildup, or bloody nose. Respiratory: Positive cough. Denies difficulty breathing or shortness of breath.  Cardiovascular: Denies chest pain, chest tightness, palpitations or swelling in the hands or feet.   No other specific complaints in a complete review of systems (except as listed in HPI above).  Objective:   BP 130/92 mmHg  Pulse 104  Temp(Src) 98.3 F (36.8 C) (Oral)  Wt 184 lb (83.462 kg)  SpO2 99%  LMP 08/15/2015 Wt Readings from Last 3 Encounters:  08/27/15 184 lb (83.462 kg)  05/14/15 178 lb (80.74 kg)  03/06/15 175 lb (79.379 kg)     General: Appears her stated age, ill appearing in NAD. HEENT: Head: normal shape and size, no sinus tenderness noted; Eyes: sclera white, no icterus, conjunctiva pink; Ears: Tm's gray and intact, normal light reflex; Nose: mucosa pink and moist, septum midline; Throat/Mouth: + PND. Teeth present, mucosa  erythematous and moist, no exudate noted, no lesions or ulcerations noted.  Neck: No cervical lymphadenopathy.  Cardiovascular: Normal rate and rhythm. S1,S2 noted.  No murmur, rubs or gallops noted.  Pulmonary/Chest: Normal effort and positive vesicular breath sounds. No respiratory distress. No wheezes, rales or ronchi noted.      Assessment & Plan:   Upper Respiratory Infection:  Get some rest and drink plenty of water Do salt water gargles for the sore throat eRx for Azithromax x 5 days Rx for Hycodan cough syrup  RTC as needed or if symptoms persist.

## 2015-09-10 ENCOUNTER — Other Ambulatory Visit: Payer: Self-pay | Admitting: Internal Medicine

## 2015-09-10 ENCOUNTER — Other Ambulatory Visit: Payer: Self-pay

## 2015-09-10 MED ORDER — TRAMADOL HCL 50 MG PO TABS: 50.0000 mg | ORAL_TABLET | Freq: Three times a day (TID) | ORAL | Status: DC | PRN

## 2015-09-10 NOTE — Telephone Encounter (Signed)
Left refill on voice mail at pharmacy  

## 2015-09-10 NOTE — Telephone Encounter (Signed)
Approved: 30 x 0 

## 2015-09-27 ENCOUNTER — Encounter: Payer: Self-pay | Admitting: Family Medicine

## 2015-09-27 ENCOUNTER — Ambulatory Visit (INDEPENDENT_AMBULATORY_CARE_PROVIDER_SITE_OTHER): Payer: Medicaid Other | Admitting: Family Medicine

## 2015-09-27 VITALS — BP 144/92 | HR 96 | Temp 98.4°F | Wt 189.5 lb

## 2015-09-27 DIAGNOSIS — R5383 Other fatigue: Secondary | ICD-10-CM | POA: Diagnosis not present

## 2015-09-27 DIAGNOSIS — J209 Acute bronchitis, unspecified: Secondary | ICD-10-CM | POA: Diagnosis not present

## 2015-09-27 LAB — POC INFLUENZA A&B (BINAX/QUICKVUE)
INFLUENZA B, POC: NEGATIVE
Influenza A, POC: NEGATIVE

## 2015-09-27 MED ORDER — GUAIFENESIN-CODEINE 100-10 MG/5ML PO SYRP: 5.0000 mL | ORAL_SOLUTION | Freq: Two times a day (BID) | ORAL | Status: DC | PRN

## 2015-09-27 MED ORDER — LEVOCETIRIZINE DIHYDROCHLORIDE 5 MG PO TABS: 5.0000 mg | ORAL_TABLET | Freq: Every evening | ORAL | Status: DC

## 2015-09-27 NOTE — Assessment & Plan Note (Addendum)
ILI vs bronchitis - flu swab today negative. Treat with zpack given progression of illness.  cheratussin for night time cough. No signs of acute asthma exacerbation today. Change zyrtec to xyzal - Rx sent in but discussed this is now OTC.

## 2015-09-27 NOTE — Progress Notes (Signed)
BP 144/92 mmHg  Pulse 96  Temp(Src) 98.4 F (36.9 C) (Oral)  Wt 189 lb 8 oz (85.957 kg)  SpO2 97%  LMP 09/20/2015   CC: "I don't feel that great"  Subjective:    Patient ID: Bridget Maldonado, female    DOB: 03-24-1981, 35 y.o.   MRN: [id number]  HPI: Bridget Maldonado is a 35 y.o. female presenting on 09/27/2015 for Fatigue   1 mo h/o congestion, rhinorrhea, headache and neck pain, cough, ear fullness, worsening marked fatigue over last 2 days. Feels loopy from fatigue. Productive cough progressively worse over last 2 days as well. Symptoms worse at night time. Subjective fevers last night along with ST.  No fever (but feverish), chills, ST, PNdrainage.  Taking tessalon perls , but not effective. Taking ibuprofen.   Father sick at home. 9 mo premie baby (23 wks) at home (she already had RSV vaccinations). She's not having any symptoms.  H/o bronchitis.  H/o asthma. No smokers at home.  Didn't receive flu shot this year.  Ran out of xyzal, has been taking zyrtec in its place. Not as effective.   Relevant past medical, surgical, family and social history reviewed and updated as indicated. Interim medical history since our last visit reviewed. Allergies and medications reviewed and updated. Current Outpatient Prescriptions on File Prior to Visit  Medication Sig  . ALPRAZolam (XANAX) 0.25 MG tablet Take 1 tablet (0.25 mg total) by mouth 2 (two) times daily as needed for anxiety.  Marland Kitchen NIFEdipine (PROCARDIA-XL/ADALAT CC) 30 MG 24 hr tablet Take 30 mg by mouth daily. Reported on 08/27/2015  . traMADol (ULTRAM) 50 MG tablet Take 1 tablet (50 mg total) by mouth 3 (three) times daily as needed.  Marland Kitchen HYDROcodone-homatropine (HYCODAN) 5-1.5 MG/5ML syrup Take 5 mLs by mouth at bedtime as needed for cough. (Patient not taking: Reported on 09/27/2015)   No current facility-administered medications on file prior to visit.    Review of Systems Per HPI unless specifically indicated in ROS section       Objective:    BP 144/92 mmHg  Pulse 96  Temp(Src) 98.4 F (36.9 C) (Oral)  Wt 189 lb 8 oz (85.957 kg)  SpO2 97%  LMP 09/20/2015  Wt Readings from Last 3 Encounters:  09/27/15 189 lb 8 oz (85.957 kg)  08/27/15 184 lb (83.462 kg)  05/14/15 178 lb (80.74 kg)    Physical Exam  Constitutional: She appears well-developed and well-nourished. No distress.  HENT:  Head: Normocephalic and atraumatic.  Right Ear: Hearing, tympanic membrane, external ear and ear canal normal.  Left Ear: Hearing, tympanic membrane, external ear and ear canal normal.  Nose: Mucosal edema present. No rhinorrhea. Right sinus exhibits no maxillary sinus tenderness and no frontal sinus tenderness. Left sinus exhibits no maxillary sinus tenderness and no frontal sinus tenderness.  Mouth/Throat: Uvula is midline and mucous membranes are normal. Posterior oropharyngeal erythema present. No oropharyngeal exudate, posterior oropharyngeal edema or tonsillar abscesses.  Scarring of bilateral TMs without acute infection Nasal mucosal injection Mild posterior oropharyngeal erythema  Eyes: Conjunctivae and EOM are normal. Pupils are equal, round, and reactive to light. No scleral icterus.  Neck: Normal range of motion. Neck supple.  Cardiovascular: Normal rate, regular rhythm, normal heart sounds and intact distal pulses.   No murmur heard. Pulmonary/Chest: Effort normal and breath sounds normal. No respiratory distress. She has no wheezes. She has no rales.  Lungs clear but harsh cough present  Lymphadenopathy:    She has  no cervical adenopathy.  Skin: Skin is warm and dry. No rash noted.  Nursing note and vitals reviewed.  Results for orders placed or performed in visit on 09/27/15  POC Influenza A&B(BINAX/QUICKVUE)  Result Value Ref Range   Influenza A, POC Negative Negative   Influenza B, POC Negative Negative      Assessment & Plan:   Problem List Items Addressed This Visit    Acute bronchitis - Primary     ILI vs bronchitis - flu swab today negative. Treat with zpack given progression of illness.  cheratussin for night time cough. No signs of acute asthma exacerbation today. Change zyrtec to xyzal - Rx sent in but discussed this is now OTC.       Relevant Orders   POC Influenza A&B(BINAX/QUICKVUE) (Completed)    Other Visit Diagnoses    Other fatigue        Relevant Orders    POC Influenza A&B(BINAX/QUICKVUE) (Completed)        Follow up plan: Return if symptoms worsen or fail to improve.  Ria Bush, MD

## 2015-09-27 NOTE — Progress Notes (Signed)
Pre visit review using our clinic review tool, if applicable. No additional management support is needed unless otherwise documented below in the visit note. 

## 2015-09-27 NOTE — Patient Instructions (Addendum)
You have bronchitis. Treat with zpack. Continue ibuprofen.  We have sent in xyzal prescription for you as well. Use medication as prescribed: cheratussin for cough at night time. Push fluids and plenty of rest. Please return if you are not improving as expected, or if you have high fevers (>101.5) or difficulty swallowing or worsening productive cough. Call clinic with questions.  Good to see you today. I hope you start feeling better soon.

## 2015-09-28 ENCOUNTER — Other Ambulatory Visit: Payer: Self-pay | Admitting: *Deleted

## 2015-09-28 MED ORDER — AZITHROMYCIN 250 MG PO TABS: 250.0000 mg | ORAL_TABLET | Freq: Every day | ORAL | Status: DC

## 2015-10-08 ENCOUNTER — Encounter: Payer: Self-pay | Admitting: Internal Medicine

## 2015-10-08 ENCOUNTER — Other Ambulatory Visit: Payer: Self-pay | Admitting: Internal Medicine

## 2015-10-08 NOTE — Telephone Encounter (Signed)
Last filled 09-10-15 #30 Last OV 09-27-15 for sick visit No future visit

## 2015-10-08 NOTE — Telephone Encounter (Signed)
Approved: 30 x 0 

## 2015-10-09 MED ORDER — TRAMADOL HCL 50 MG PO TABS: 50.0000 mg | ORAL_TABLET | Freq: Three times a day (TID) | ORAL | Status: DC | PRN

## 2015-10-09 NOTE — Telephone Encounter (Signed)
Tramadol called into [facility] [address] 

## 2015-10-09 NOTE — Telephone Encounter (Signed)
Left refill on voice mail at pharmacy  

## 2015-10-15 ENCOUNTER — Encounter: Payer: Self-pay | Admitting: Family Medicine

## 2015-10-15 ENCOUNTER — Ambulatory Visit (INDEPENDENT_AMBULATORY_CARE_PROVIDER_SITE_OTHER): Payer: Medicaid Other | Admitting: Family Medicine

## 2015-10-15 VITALS — BP 166/122 | HR 104 | Temp 99.3°F | Wt 193.5 lb

## 2015-10-15 DIAGNOSIS — R053 Chronic cough: Secondary | ICD-10-CM

## 2015-10-15 DIAGNOSIS — R05 Cough: Secondary | ICD-10-CM | POA: Diagnosis not present

## 2015-10-15 MED ORDER — HYDROCOD POLST-CPM POLST ER 10-8 MG/5ML PO SUER: 5.0000 mL | Freq: Two times a day (BID) | ORAL | Status: DC | PRN

## 2015-10-15 MED ORDER — PREDNISONE 50 MG PO TABS: ORAL_TABLET | ORAL | Status: DC

## 2015-10-15 NOTE — Patient Instructions (Signed)
Take the medication as prescribed.  Follow up closely with your PCP.  Take care  Dr. Lacinda Axon

## 2015-10-15 NOTE — Assessment & Plan Note (Signed)
Patient now with chronic cough. Likely post viral with asthma component.  No evidence of acute asthma exacerbation at this time. Treated with prednisone and Tussionex. Patient to follow-up closely with PCP.

## 2015-10-15 NOTE — Progress Notes (Signed)
Subjective:  Patient ID: Bridget Maldonado, female    DOB: 22-Mar-1981  Age: 35 y.o. MRN: [id number]  CC: Continued cough  HPI:  35 year old female presents with complaints of cough.  Patient was recently seen on 4/6 and was diagnosed with acute bronchitis. At that time, she was treated with azithromycin and Cheratussin. Since that visit, she states that she has not improved. She is to have severe cough which is particularly troublesome at night. She states that it is intermittently productive. No known exacerbating factors. She reports associated low-grade temperatures although she is afebrile today. She reports associated headache and body aches as well. She is concerned that this is related to allergies as well. Additionally, her primary concern is that she does not want to get her premature child sick at home.  Social Hx   Social History   Social History  . Marital Status: Single    Spouse Name: N/A  . Number of Children: 1  . Years of Education: N/A   Occupational History  .      Social History Main Topics  . Smoking status: Never Smoker   . Smokeless tobacco: Never Used  . Alcohol Use: No     Comment: 2 per month  . Drug Use: No  . Sexual Activity: Yes   Other Topics Concern  . None   Social History Narrative   Lives with parents.   Review of Systems  Constitutional:       Subjective fever.  Respiratory: Positive for cough.   Musculoskeletal: Positive for myalgias.  Neurological: Positive for headaches.    Objective:  BP 166/122 mmHg  Pulse 104  Temp(Src) 99.3 F (37.4 C) (Oral)  Wt 193 lb 8 oz (87.771 kg)  SpO2 98%  LMP 09/20/2015  BP/Weight 10/15/2015 0000000 [id number]  Systolic BP [id number] [id number]  Diastolic BP 123XX123 92 92  Wt. (Lbs) 193.5 189.5 184  BMI 33.2 32.51 31.57   Physical Exam  Constitutional: She is oriented to person, place, and time. She appears well-developed. No distress.  HENT:  Mouth/Throat: Oropharynx is clear and moist.  TM's with  scarring bilaterally.   Cardiovascular: Regular rhythm.  Tachycardia present.   Pulmonary/Chest: Effort normal. She has no wheezes. She has no rales.  Significant cough during exam.  Neurological: She is alert and oriented to person, place, and time.  Psychiatric: She has a normal mood and affect.  Vitals reviewed.  Lab Results  Component Value Date   WBC 12.8* 12/26/2014   HGB 13.4 12/26/2014   HCT 39.8 12/26/2014   PLT 97* 12/26/2014   GLUCOSE 82 12/26/2014   ALT 72* 12/26/2014   AST 86* 12/26/2014   NA 137 12/26/2014   K 3.7 12/26/2014   CL 105 12/26/2014   CREATININE 0.86 12/26/2014   BUN 19 12/26/2014   CO2 21* 12/26/2014   TSH 0.73 08/13/2009   INR 0.94 12/26/2014   Assessment & Plan:   Problem List Items Addressed This Visit    Chronic cough - Primary    Patient now with chronic cough. Likely post viral with asthma component.  No evidence of acute asthma exacerbation at this time. Treated with prednisone and Tussionex. Patient to follow-up closely with PCP.         Meds ordered this encounter  Medications  . predniSONE (DELTASONE) 50 MG tablet    Sig: 1 tablet daily x 5 days.    Dispense:  5 tablet    Refill:  0  . chlorpheniramine-HYDROcodone (  TUSSIONEX PENNKINETIC ER) 10-8 MG/5ML SUER    Sig: Take 5 mLs by mouth every 12 (twelve) hours as needed.    Dispense:  115 mL    Refill:  0   Follow-up: PRN  [place]

## 2015-10-15 NOTE — Progress Notes (Signed)
Pre visit review using our clinic review tool, if applicable. No additional management support is needed unless otherwise documented below in the visit note. 

## 2015-10-25 ENCOUNTER — Encounter: Payer: Self-pay | Admitting: Internal Medicine

## 2015-10-25 ENCOUNTER — Ambulatory Visit (INDEPENDENT_AMBULATORY_CARE_PROVIDER_SITE_OTHER): Payer: Medicaid Other | Admitting: Internal Medicine

## 2015-10-25 VITALS — BP 126/90 | HR 106 | Temp 99.4°F

## 2015-10-25 DIAGNOSIS — M546 Pain in thoracic spine: Secondary | ICD-10-CM

## 2015-10-25 NOTE — Progress Notes (Signed)
Subjective:    Patient ID: Bridget Maldonado, female    DOB: 10/30/1980, 35 y.o.   MRN: [id number]  HPI  Pt presents to the clinic today with c/o back pain. This started 1 month ago. She has a h/o back problems for the past 6 yrs, but this past month showed an increase in pain and numbness. The pain starts around L1 and radiates up the spine towards the midline of the back. She describes this same path as numb. When bending laterally or twisting her upper body, she has sharp bursts of pain down her hips. She has been taking Aleve, Ibuprofen, Tramadol and Flexeril for pain control with some relief. She is taking 5 Tramadol a day. She works at a [employer], and spends most of her day on her feet and bent over. She denies loss of bladder or bowel control. Thoracic spine xray from 06/2011 showed no acute or traumatic findings and minimal curvature of the back towards the left.   Review of Systems  Past Medical History  Diagnosis Date  . 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 Chronic headaches   . Psoriasis   . Obese   . 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 Hemorrhoids 08/2007  . IBS (irritable bowel syndrome) 08/2007  . Bipolar disorder ([place])   . Multiple allergies   . Asthma   . Migraines   . Kidney infection     Current Outpatient Prescriptions  Medication Sig Dispense Refill  . ALPRAZolam (XANAX) 0.25 MG tablet Take 1 tablet (0.25 mg total) by mouth 2 (two) times daily as needed for anxiety. 60 tablet 0  . levocetirizine (XYZAL) 5 MG tablet Take 1 tablet (5 mg total) by mouth every evening. 30 tablet 3  . NIFEdipine (PROCARDIA-XL/ADALAT CC) 30 MG 24 hr tablet Take 30 mg by mouth daily. Reported on 08/27/2015    . traMADol (ULTRAM) 50 MG tablet Take 1 tablet (50 mg total) by mouth 3 (three) times daily as needed. 30 tablet 0   No current facility-administered medications for this visit.    Allergies  Allergen Reactions  . Sulfa Antibiotics Nausea And Vomiting  . Ciprofloxacin  Nausea And Vomiting  . Effexor [Venlafaxine Hcl] Other (See Comments)    Reaction:  Made pt sleep for two days straight.    Family History  Problem Relation Age of Onset  . Breast cancer Mother   . Rheum arthritis Mother   . Asthma Father   . Anxiety disorder Father     Severe, possible bipolar  . Colon polyps Maternal Grandfather   . Irritable bowel syndrome Mother   . Irritable bowel syndrome Maternal Grandmother   . Diabetes      mat. great uncles  . Alzheimer's disease      Dad's side  . Parkinson's disease Maternal Grandfather   . Colon cancer      mat GGF    Social History   Social History  . Marital Status: Single    Spouse Name: N/A  . Number of Children: 1  . Years of Education: N/A   Occupational History  .      Social History Main Topics  . Smoking status: Never Smoker   . Smokeless tobacco: Never Used  . Alcohol Use: No     Comment: 2 per month  . Drug Use: No  . Sexual Activity: Yes   Other Topics Concern  . Not on file   Social History Narrative  Lives with parents.     Respiratory: Denies difficulty breathing, shortness of breath, cough or sputum production.   Cardiovascular: Denies chest pain, chest tightness, or palpitations.  GI: Denies loss of bowel or bladder control  Musculoskeletal: Positive for back pain and decreased ROM due to pain. Pt reports numbness ascending up her spine. Denies numbness of tingling of her buttocks or legs.  Neurological: Denies dizziness, or problems with balance and coordination.   No other specific complaints in a complete review of systems (except as listed in HPI above).     Objective:   Physical Exam  BP 126/90 mmHg  Pulse 106  Temp(Src) 99.4 F (37.4 C) (Oral)  Wt   SpO2 98%  LMP 09/20/2015 Wt Readings from Last 3 Encounters:  10/15/15 193 lb 8 oz (87.771 kg)  09/27/15 189 lb 8 oz (85.957 kg)  08/27/15 184 lb (83.462 kg)    General: Appears her stated age, obese, and in NAD. Skin: Warm,  dry and intact.  Cardiovascular: Normal rate and rhythm. S1,S2 noted.  No murmur, rubs or gallops noted.  Pulmonary/Chest: Normal effort and positive vesicular breath sounds. No respiratory distress. No wheezes, rales or ronchi noted.  Musculoskeletal: TTP over spinous process and musculature of the back from about T1 to L1. Decrease ROM due to pain. No signs of joint swelling. No difficulty with gait.  Neurological: Alert and oriented. Sensation intact to BUE and BLE.    BMET    Component Value Date/Time   NA 137 12/26/2014 1741   NA 138 07/11/2013 1844   K 3.7 12/26/2014 1741   K 3.3* 07/11/2013 1844   CL 105 12/26/2014 1741   CL 105 07/11/2013 1844   CO2 21* 12/26/2014 1741   CO2 28 07/11/2013 1844   GLUCOSE 82 12/26/2014 1741   GLUCOSE 89 07/11/2013 1844   BUN 19 12/26/2014 1741   BUN 12 07/11/2013 1844   CREATININE 0.86 12/26/2014 1741   CREATININE 0.79 07/11/2013 1844   CALCIUM 8.5* 12/26/2014 1741   CALCIUM 8.9 07/11/2013 1844   GFRNONAA >60 12/26/2014 1741   GFRNONAA >60 07/11/2013 1844   GFRAA >60 12/26/2014 1741   GFRAA >60 07/11/2013 1844    Lipid Panel  No results found for: CHOL, TRIG, HDL, CHOLHDL, VLDL, LDLCALC  CBC    Component Value Date/Time   WBC 12.8* 12/26/2014 1741   WBC 10.1 07/11/2013 1844   RBC 4.54 12/26/2014 1741   RBC 5.10 07/11/2013 1844   HGB 13.4 12/26/2014 1741   HGB 14.1 08/25/2014   HGB 14.7 07/11/2013 1844   HCT 39.8 12/26/2014 1741   HCT 42 08/25/2014   HCT 43.6 07/11/2013 1844   PLT 97* 12/26/2014 1741   PLT 238 08/25/2014   PLT 151 07/11/2013 1844   MCV 87.5 12/26/2014 1741   MCV 86 07/11/2013 1844   MCH 29.5 12/26/2014 1741   MCH 28.8 07/11/2013 1844   MCHC 33.7 12/26/2014 1741   MCHC 33.7 07/11/2013 1844   RDW 13.3 12/26/2014 1741   RDW 13.7 07/11/2013 1844   LYMPHSABS 1.5 11/19/2014 1102   MONOABS 0.5 11/19/2014 1102   EOSABS 0.1 11/19/2014 1102   BASOSABS 0.0 11/19/2014 1102   BASOSABS 1 07/11/2013 1844     Hgb A1C No results found for: HGBA1C       Assessment & Plan:   Back pain:  Continue Flexeril, Tramadol, and Ibuprofen for pain control Back stretches provided in handout Follow up with PCP regarding Tramadol dose increase If symptoms  persist, will consider repeat xray or referral to PT  Follow up with PCP if symptoms persist or worsen

## 2015-10-25 NOTE — Progress Notes (Signed)
Pre visit review using our clinic review tool, if applicable. No additional management support is needed unless otherwise documented below in the visit note. 

## 2015-10-25 NOTE — Patient Instructions (Signed)

## 2015-11-01 ENCOUNTER — Encounter: Payer: Self-pay | Admitting: Internal Medicine

## 2015-11-01 ENCOUNTER — Encounter: Payer: Self-pay | Admitting: Emergency Medicine

## 2015-11-01 ENCOUNTER — Observation Stay
Admission: EM | Admit: 2015-11-01 | Discharge: 2015-11-03 | Disposition: A | Discharge status: Home / Self Care | Payer: Medicaid Other | Attending: Internal Medicine | Admitting: Internal Medicine

## 2015-11-01 ENCOUNTER — Telehealth: Payer: Self-pay

## 2015-11-01 ENCOUNTER — Emergency Department: Payer: Medicaid Other

## 2015-11-01 DIAGNOSIS — K580 Irritable bowel syndrome with diarrhea: Secondary | ICD-10-CM | POA: Diagnosis not present

## 2015-11-01 DIAGNOSIS — Z6833 Body mass index (BMI) 33.0-33.9, adult: Secondary | ICD-10-CM | POA: Diagnosis not present

## 2015-11-01 DIAGNOSIS — Z79899 Other long term (current) drug therapy: Secondary | ICD-10-CM | POA: Diagnosis not present

## 2015-11-01 DIAGNOSIS — E860 Dehydration: Secondary | ICD-10-CM | POA: Diagnosis not present

## 2015-11-01 DIAGNOSIS — Z882 Allergy status to sulfonamides status: Secondary | ICD-10-CM | POA: Insufficient documentation

## 2015-11-01 DIAGNOSIS — L409 Psoriasis, unspecified: Secondary | ICD-10-CM | POA: Diagnosis not present

## 2015-11-01 DIAGNOSIS — F319 Bipolar disorder, unspecified: Secondary | ICD-10-CM | POA: Diagnosis not present

## 2015-11-01 DIAGNOSIS — F410 Panic disorder [episodic paroxysmal anxiety] without agoraphobia: Secondary | ICD-10-CM | POA: Insufficient documentation

## 2015-11-01 DIAGNOSIS — R05 Cough: Secondary | ICD-10-CM | POA: Diagnosis not present

## 2015-11-01 DIAGNOSIS — G43901 Migraine, unspecified, not intractable, with status migrainosus: Secondary | ICD-10-CM | POA: Diagnosis not present

## 2015-11-01 DIAGNOSIS — K5900 Constipation, unspecified: Secondary | ICD-10-CM | POA: Insufficient documentation

## 2015-11-01 DIAGNOSIS — R509 Fever, unspecified: Secondary | ICD-10-CM | POA: Diagnosis present

## 2015-11-01 DIAGNOSIS — Z800 Family history of malignant neoplasm of digestive organs: Secondary | ICD-10-CM | POA: Diagnosis not present

## 2015-11-01 DIAGNOSIS — R000 Tachycardia, unspecified: Secondary | ICD-10-CM | POA: Insufficient documentation

## 2015-11-01 DIAGNOSIS — E876 Hypokalemia: Secondary | ICD-10-CM | POA: Insufficient documentation

## 2015-11-01 DIAGNOSIS — R6510 Systemic inflammatory response syndrome (SIRS) of non-infectious origin without acute organ dysfunction: Principal | ICD-10-CM | POA: Insufficient documentation

## 2015-11-01 DIAGNOSIS — M1990 Unspecified osteoarthritis, unspecified site: Secondary | ICD-10-CM | POA: Insufficient documentation

## 2015-11-01 DIAGNOSIS — Z8261 Family history of arthritis: Secondary | ICD-10-CM | POA: Insufficient documentation

## 2015-11-01 DIAGNOSIS — M545 Low back pain: Secondary | ICD-10-CM | POA: Diagnosis not present

## 2015-11-01 DIAGNOSIS — Z9049 Acquired absence of other specified parts of digestive tract: Secondary | ICD-10-CM | POA: Diagnosis not present

## 2015-11-01 DIAGNOSIS — Z881 Allergy status to other antibiotic agents status: Secondary | ICD-10-CM | POA: Diagnosis not present

## 2015-11-01 DIAGNOSIS — J4520 Mild intermittent asthma, uncomplicated: Secondary | ICD-10-CM | POA: Insufficient documentation

## 2015-11-01 DIAGNOSIS — Z820 Family history of epilepsy and other diseases of the nervous system: Secondary | ICD-10-CM | POA: Diagnosis not present

## 2015-11-01 DIAGNOSIS — R9431 Abnormal electrocardiogram [ECG] [EKG]: Secondary | ICD-10-CM | POA: Diagnosis not present

## 2015-11-01 DIAGNOSIS — F419 Anxiety disorder, unspecified: Secondary | ICD-10-CM | POA: Diagnosis not present

## 2015-11-01 DIAGNOSIS — E669 Obesity, unspecified: Secondary | ICD-10-CM | POA: Insufficient documentation

## 2015-11-01 DIAGNOSIS — Z825 Family history of asthma and other chronic lower respiratory diseases: Secondary | ICD-10-CM | POA: Diagnosis not present

## 2015-11-01 DIAGNOSIS — R109 Unspecified abdominal pain: Secondary | ICD-10-CM | POA: Diagnosis present

## 2015-11-01 DIAGNOSIS — Z818 Family history of other mental and behavioral disorders: Secondary | ICD-10-CM | POA: Diagnosis not present

## 2015-11-01 DIAGNOSIS — Z888 Allergy status to other drugs, medicaments and biological substances status: Secondary | ICD-10-CM | POA: Insufficient documentation

## 2015-11-01 DIAGNOSIS — I10 Essential (primary) hypertension: Secondary | ICD-10-CM | POA: Insufficient documentation

## 2015-11-01 LAB — CBC
HCT: 43 % (ref 35.0–47.0)
HEMOGLOBIN: 14.1 g/dL (ref 12.0–16.0)
MCH: 27.7 pg (ref 26.0–34.0)
MCHC: 32.9 g/dL (ref 32.0–36.0)
MCV: 84.1 fL (ref 80.0–100.0)
Platelets: 242 10*3/uL (ref 150–440)
RBC: 5.12 MIL/uL (ref 3.80–5.20)
RDW: 13.8 % (ref 11.5–14.5)
WBC: 11.2 10*3/uL — ABNORMAL HIGH (ref 3.6–11.0)

## 2015-11-01 LAB — URINALYSIS COMPLETE WITH MICROSCOPIC (ARMC ONLY)
Bacteria, UA: NONE SEEN
Bilirubin Urine: NEGATIVE
GLUCOSE, UA: NEGATIVE mg/dL
Hgb urine dipstick: NEGATIVE
Ketones, ur: NEGATIVE mg/dL
Leukocytes, UA: NEGATIVE
Nitrite: NEGATIVE
PROTEIN: NEGATIVE mg/dL
SPECIFIC GRAVITY, URINE: 1.019 (ref 1.005–1.030)
pH: 5 (ref 5.0–8.0)

## 2015-11-01 LAB — BASIC METABOLIC PANEL
Anion gap: 11 (ref 5–15)
BUN: 13 mg/dL (ref 6–20)
CALCIUM: 8.8 mg/dL — AB (ref 8.9–10.3)
CHLORIDE: 106 mmol/L (ref 101–111)
CO2: 18 mmol/L — AB (ref 22–32)
CREATININE: 0.76 mg/dL (ref 0.44–1.00)
GFR calc Af Amer: >60 mL/min (ref >60)
GFR calc non Af Amer: >60 mL/min (ref >60)
GLUCOSE: 120 mg/dL — AB (ref 65–99)
Potassium: 3.3 mmol/L — ABNORMAL LOW (ref 3.5–5.1)
Sodium: 135 mmol/L (ref 135–145)

## 2015-11-01 LAB — POCT PREGNANCY, URINE: PREG TEST UR: NEGATIVE

## 2015-11-01 LAB — LACTIC ACID, PLASMA: Lactic Acid, Venous: 1.2 mmol/L (ref 0.5–2.0)

## 2015-11-01 MED ORDER — KETOROLAC TROMETHAMINE 30 MG/ML IJ SOLN
30.0000 mg | Freq: Once | INTRAMUSCULAR | Status: AC
  Administered 2015-11-01: 30 mg via INTRAVENOUS
  Filled 2015-11-01: qty 1

## 2015-11-01 MED ORDER — ONDANSETRON HCL 4 MG/2ML IJ SOLN
INTRAMUSCULAR | Status: AC
  Filled 2015-11-01: qty 2

## 2015-11-01 MED ORDER — MORPHINE SULFATE (PF) 4 MG/ML IV SOLN
4.0000 mg | Freq: Once | INTRAVENOUS | Status: AC
  Administered 2015-11-01: 4 mg via INTRAVENOUS

## 2015-11-01 MED ORDER — DIATRIZOATE MEGLUMINE & SODIUM 66-10 % PO SOLN
15.0000 mL | Freq: Once | ORAL | Status: AC
  Administered 2015-11-01: 15 mL via ORAL

## 2015-11-01 MED ORDER — MORPHINE SULFATE (PF) 4 MG/ML IV SOLN
4.0000 mg | Freq: Once | INTRAVENOUS | Status: AC
  Administered 2015-11-01: 4 mg via INTRAVENOUS
  Filled 2015-11-01: qty 1

## 2015-11-01 MED ORDER — ACETAMINOPHEN 500 MG PO TABS
ORAL_TABLET | ORAL | Status: AC
  Administered 2015-11-01: 1000 mg via ORAL
  Filled 2015-11-01: qty 2

## 2015-11-01 MED ORDER — MORPHINE SULFATE (PF) 2 MG/ML IV SOLN
INTRAVENOUS | Status: AC
  Filled 2015-11-01: qty 2

## 2015-11-01 MED ORDER — SODIUM CHLORIDE 0.9 % IV BOLUS (SEPSIS)
1000.0000 mL | Freq: Once | INTRAVENOUS | Status: AC
  Administered 2015-11-01: 1000 mL via INTRAVENOUS

## 2015-11-01 MED ORDER — ACETAMINOPHEN 500 MG PO TABS
1000.0000 mg | ORAL_TABLET | Freq: Once | ORAL | Status: AC
  Administered 2015-11-01: 1000 mg via ORAL

## 2015-11-01 MED ORDER — DICYCLOMINE HCL 20 MG PO TABS: 20.0000 mg | ORAL_TABLET | Freq: Three times a day (TID) | ORAL | Status: DC | PRN

## 2015-11-01 MED ORDER — ONDANSETRON HCL 4 MG/2ML IJ SOLN
4.0000 mg | Freq: Once | INTRAMUSCULAR | Status: AC
  Administered 2015-11-01: 4 mg via INTRAVENOUS

## 2015-11-01 MED ORDER — IOPAMIDOL (ISOVUE-300) INJECTION 61%
100.0000 mL | Freq: Once | INTRAVENOUS | Status: AC | PRN
  Administered 2015-11-01: 100 mL via INTRAVENOUS
  Filled 2015-11-01: qty 100

## 2015-11-01 NOTE — Telephone Encounter (Signed)
Per chart review tab pt is at [facility] ED. 

## 2015-11-01 NOTE — ED Notes (Signed)
C/o left lower back pain. Dr. Archie Balboa alerted.  Continue to monitor patient.

## 2015-11-01 NOTE — ED Notes (Signed)
Pt ambulatory to restroom with steady but slow gait. This Rn walked beside pt but pt did not need this Rn as an assist.

## 2015-11-01 NOTE — ED Notes (Signed)
Pt was eating ice, so temperature was taken axillary.

## 2015-11-01 NOTE — Telephone Encounter (Signed)
PLEASE NOTE: All timestamps contained within this report are represented as [place] Standard Time. CONFIDENTIALTY NOTICE: This fax transmission is intended only for the addressee. It contains information that is legally privileged, confidential or otherwise protected from use or disclosure. If you are not the intended recipient, you are strictly prohibited from reviewing, disclosing, copying using or disseminating any of this information or taking any action in reliance on or regarding this information. If you have received this fax in error, please notify us immediately by telephone so that we can arrange for its return to [place]. Phone: [phone], Toll-Free: [phone], Fax: [phone] Page: 1 of 2 Call Id: [id number] Cesar Chavez Patient Name: Bridget Maldonado Gender: Female DOB: [birth year] Age: 35 Y 9 M 18 D Return Phone Number: [phone] (Primary) Address: City/State/Zip: [place] Client [place] [facility] - Client Client Site [place] - Day Physician Viviana Simpler - MD Contact Type Call Who Is Calling Patient / Member / Family / Caregiver Call Type Triage / Clinical Relationship To Patient Self Return Phone Number [phone] (Primary) Chief Complaint Back Pain - General Reason for Call Symptomatic / Request for [place] states has a bad back pain, hurts to breathe, fever. Appointment Disposition EMR Appointment Not Necessary Info pasted into Epic No PreDisposition Call Doctor Translation No Nurse Assessment Nurse: Christel Mormon, RN, Levada Dy Date/Time Eilene Ghazi Time): 11/01/2015 3:12:26 PM Confirm and document reason for call. If symptomatic, describe symptoms. You must click the next button to save text entered. ---Caller states she has had 3 episodes of pyelonephritis since giving birth. She woke up this am with severe  lower back pain that wraps around to the point it hurts to breathe. She has been drinking fluids but her urine is dark. She states the pain is severe and she has a temp of 100.5 with Ibuprofen. Has the patient traveled out of the country within the last 30 days? ---No Does the patient have any new or worsening symptoms? ---Yes Will a triage be completed? ---Yes Related visit to physician within the last 2 weeks? ---No Does the PT have any chronic conditions? (i.e. diabetes, asthma, etc.) ---Yes List chronic conditions. ---asthma Is the patient pregnant or possibly pregnant? (Ask all females between the ages of 62-55) ---No Is this a behavioral health or substance abuse call? ---No Guidelines Guideline Title Affirmed Question Affirmed Notes Nurse Date/Time (Eastern Time) Flank Pain [1] SEVERE pain (e.g., excruciating, scale 8-10) AND [2] present > 1 hour [place], RN, Levada Dy 11/01/2015 3:17:05 PM PLEASE NOTE: All timestamps contained within this report are represented as [place] Standard Time. CONFIDENTIALTY NOTICE: This fax transmission is intended only for the addressee. It contains information that is legally privileged, confidential or otherwise protected from use or disclosure. If you are not the intended recipient, you are strictly prohibited from reviewing, disclosing, copying using or disseminating any of this information or taking any action in reliance on or regarding this information. If you have received this fax in error, please notify us immediately by telephone so that we can arrange for its return to [place]. Phone: [phone], Toll-Free: [phone], Fax: [phone] Page: 2 of 2 Call Id: [id number] [place]. Time Eilene Ghazi Time) Disposition Final User 11/01/2015 3:18:48 PM Go to ED Now Yes Christel Mormon, RN, [place] Shutter Understands: Yes Disagree/Comply: Comply Care Advice Given Per Guideline GO TO ED NOW: You need to be seen in the Emergency Department. Go to  the ER at ___________  [place] now. Drive carefully. CARE ADVICE given per Flank Pain (Adult) guideline. Comments User: Donnie Aho, RN Date/Time Eilene Ghazi Time): 11/01/2015 3:22:18 PM Office notified that pt going to ER [facility]errals [facility] - [facility]

## 2015-11-01 NOTE — ED Notes (Signed)
Pt laying on stomach, asleep. Family at bedside.

## 2015-11-01 NOTE — ED Provider Notes (Signed)
[facility] Emergency Department Provider Note    ____________________________________________  Time seen: ~1740  I have reviewed the triage vital signs and the nursing notes.   HISTORY  Chief Complaint Flank Pain and Fever   History limited by: Not Limited   HPI Bridget Maldonado is a 35 y.o. female who presents to the emergency department today because of concerns for left flank pain and fever. Patient states that the symptoms started this morning. She describes pain as being located in her left flank. It does) towards her front. She also has some pain in her right flank but states this is minimal. She states that she has had similar symptoms in the past. She states she has had pyelonephritis in the past with similar symptoms. She states that this is the way it usually happens. There is no other symptoms until the pain starts. She states she has been seen both here and at [facility] for this.   Past Medical History  Diagnosis Date  . 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 Chronic headaches   . Psoriasis   . Obese   . 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 Hemorrhoids 08/2007  . IBS (irritable bowel syndrome) 08/2007  . Bipolar disorder ([place])   . Multiple allergies   . Asthma   . Migraines   . Kidney infection     Patient Active Problem List   Diagnosis Date Noted  . Chronic cough 10/15/2015  . Pregnant and not yet delivered 12/26/2014  . Back pain affecting pregnancy in second trimester 12/26/2014  . Pyelonephritis affecting pregnancy in second trimester, antepartum 11/15/2014  . Chronic hypertension 11/15/2014  . Abnormal ECG 10/18/2014  . Essential (primary) hypertension 10/18/2014  . Acute asthma flare 05/25/2014  . Arthritis 11/24/2013  . Arthropathia 11/24/2013  . Obstipation 04/12/2013  . CN (constipation) 04/12/2013  . Migraine with status migrainosus 09/08/2012  . 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 Adaptive  colitis 06/07/2012  . Mild intermittent asthma 05/26/2011  . Severe anxiety with panic 08/29/2006  . Panic disorder without agoraphobia 08/29/2006    Past Surgical History  Procedure Laterality Date  . Laparoscopic cholecystectomy  6/09    Dr. Hulen Skains  . Tympanostomy tube placement    . Ear tube removal    . Colonoscopy w/ biopsies    . Esophagogastroduodenoscopy    . Colonoscopy    . Cesarean section      Current Outpatient Rx  Name  Route  Sig  Dispense  Refill  . ALPRAZolam (XANAX) 0.25 MG tablet   Oral   Take 1 tablet (0.25 mg total) by mouth 2 (two) times daily as needed for anxiety.   60 tablet   0   . levocetirizine (XYZAL) 5 MG tablet   Oral   Take 1 tablet (5 mg total) by mouth every evening.   30 tablet   3   . NIFEdipine (PROCARDIA-XL/ADALAT CC) 30 MG 24 hr tablet   Oral   Take 30 mg by mouth daily. Reported on 08/27/2015         . traMADol (ULTRAM) 50 MG tablet   Oral   Take 1 tablet (50 mg total) by mouth 3 (three) times daily as needed.   30 tablet   0     Allergies Sulfa antibiotics; Ciprofloxacin; and Effexor  Family History  Problem Relation Age of Onset  . Breast cancer Mother   . Rheum arthritis Mother   . Asthma  Father   . Anxiety disorder Father     Severe, possible bipolar  . Colon polyps Maternal Grandfather   . Irritable bowel syndrome Mother   . Irritable bowel syndrome Maternal Grandmother   . Diabetes      mat. great uncles  . Alzheimer's disease      Dad's side  . Parkinson's disease Maternal Grandfather   . Colon cancer      mat GGF    Social History Social History  Substance Use Topics  . Smoking status: Never Smoker   . Smokeless tobacco: Never Used  . Alcohol Use: No     Comment: 2 per month    Review of Systems  Constitutional: Negative for fever. Cardiovascular: Negative for chest pain. Respiratory: Negative for shortness of breath. Gastrointestinal: Positive for left flank pain Neurological: Negative  for headaches, focal weakness or numbness.  10-point ROS otherwise negative.  ____________________________________________   PHYSICAL EXAM:  VITAL SIGNS: ED Triage Vitals  Enc Vitals Group     BP 11/01/15 1620 145/97 mmHg     Pulse Rate 11/01/15 1620 140     Resp 11/01/15 1620 18     Temp 11/01/15 1620 101.6 F (38.7 C)     Temp Source 11/01/15 1620 Oral     SpO2 11/01/15 1620 99 %     Weight 11/01/15 1614 185 lb (83.915 kg)     Height 11/01/15 1614 5\' 4"  (1.626 m)     Head Cir --      Peak Flow --      Pain Score 11/01/15 1614 10   Constitutional: Alert and oriented. Well appearing and in no distress. Eyes: Conjunctivae are normal. PERRL. Normal extraocular movements. ENT   Head: Normocephalic and atraumatic.   Nose: No congestion/rhinnorhea.   Mouth/Throat: Mucous membranes are moist.   Neck: No stridor. Hematological/Lymphatic/Immunilogical: No cervical lymphadenopathy. Cardiovascular: Normal rate, regular rhythm.  No murmurs, rubs, or gallops. Respiratory: Normal respiratory effort without tachypnea nor retractions. Breath sounds are clear and equal bilaterally. No wheezes/rales/rhonchi. Gastrointestinal: Soft and nontender. No distention. Mild left sided cva tenderness. Genitourinary: Deferred Musculoskeletal: Normal range of motion in all extremities. No joint effusions.  No lower extremity tenderness nor edema. Neurologic:  Normal speech and language. No gross focal neurologic deficits are appreciated.  Skin:  Skin is warm, dry and intact. No rash noted. Psychiatric: Mood and affect are normal. Speech and behavior are normal. Patient exhibits appropriate insight and judgment.  ____________________________________________    LABS (pertinent positives/negatives)  Labs Reviewed  BASIC METABOLIC PANEL - Abnormal; Notable for the following:    Potassium 3.3 (*)    CO2 18 (*)    Glucose, Bld 120 (*)    Calcium 8.8 (*)    All other components within  normal limits  CBC - Abnormal; Notable for the following:    WBC 11.2 (*)    All other components within normal limits  URINALYSIS COMPLETEWITH MICROSCOPIC ([facility] ONLY) - Abnormal; Notable for the following:    Color, Urine YELLOW (*)    APPearance CLEAR (*)    Squamous Epithelial / LPF 0-5 (*)    All other components within normal limits  CULTURE, BLOOD (ROUTINE X 2)  CULTURE, BLOOD (ROUTINE X 2)  URINE CULTURE  LACTIC ACID, PLASMA  LACTIC ACID, PLASMA  POCT PREGNANCY, URINE     ____________________________________________   EKG  I, Nance Pear, attending physician, personally viewed and interpreted this EKG  EKG Time: 1626 Rate: 134 Rhythm: sinus tachycardia  Axis: normal Intervals: qtc 424 QRS: narrow ST changes: no st elevation Impression: abnormal ekg  ____________________________________________    RADIOLOGY  CT abd/pel IMPRESSION: Negative. No radiographic evidence of pyelonephritis, renal abscess, hydronephrosis, or other acute findings.   ____________________________________________   PROCEDURES  Procedure(s) performed: None  Critical Care performed: No  ____________________________________________   INITIAL IMPRESSION / ASSESSMENT AND PLAN / ED COURSE  Pertinent labs & imaging results that were available during my care of the patient were reviewed by me and considered in my medical decision making (see chart for details).  Patient presents to the emergency department today because of concerns for left flank pain and fever and pyelonephritis. Patient does have some left-sided CVA tenderness. Patient is tachycardic. Awaiting UA.  ----------------------------------------- 7:23 PM on 11/01/2015 -----------------------------------------  Urine without any concerning signs for infection. At this point patient still continues to have significant pain. Will plan on getting CT scan to further  evaluate.  ----------------------------------------- 9:56 PM on 11/01/2015 -----------------------------------------  CT scan without any concerning findings. At this point no explanation for the patient's symptoms. Patient still somewhat tachycardic. Will recheck temperature and give fluids.  ----------------------------------------- 10:55 PM on 11/01/2015 -----------------------------------------  I did send a lactic acid just to make sure there was no signs of any bad infection. This came back within normal limits. This point patient still complaining of some pain however CT scan negative. Lactic acid negative. Think likely viral. If the heart rate improves to think patient will be able to be discharged home. I will prepare paperwork in the event of resolution of the tachycardia.    ____________________________________________   FINAL CLINICAL IMPRESSION(S) / ED DIAGNOSES  Final diagnoses:  Abdominal pain, unspecified abdominal location  Fever, unspecified fever cause     Nance Pear, MD 11/01/15 2258

## 2015-11-01 NOTE — Telephone Encounter (Signed)
Please call her to offer appt with Dr Lorelei Pont

## 2015-11-01 NOTE — ED Notes (Signed)
Pt has hx of pyelonephritis, and today her symptoms feel the same, bilateral lower back pain L>R, fever, slight abdominal pain.

## 2015-11-01 NOTE — Telephone Encounter (Signed)
Will follow up after ER report

## 2015-11-01 NOTE — ED Notes (Signed)
Ambulated to BR.  Gait steady.  Minimal assist of 1.  Continue to monitor.

## 2015-11-02 ENCOUNTER — Encounter: Payer: Self-pay | Admitting: Internal Medicine

## 2015-11-02 DIAGNOSIS — R6510 Systemic inflammatory response syndrome (SIRS) of non-infectious origin without acute organ dysfunction: Secondary | ICD-10-CM | POA: Diagnosis present

## 2015-11-02 LAB — CBC
HCT: 36.1 % (ref 35.0–47.0)
Hemoglobin: 12.3 g/dL (ref 12.0–16.0)
MCH: 28.5 pg (ref 26.0–34.0)
MCHC: 34 g/dL (ref 32.0–36.0)
MCV: 83.7 fL (ref 80.0–100.0)
PLATELETS: 186 10*3/uL (ref 150–440)
RBC: 4.31 MIL/uL (ref 3.80–5.20)
RDW: 13.7 % (ref 11.5–14.5)
WBC: 5.8 10*3/uL (ref 3.6–11.0)

## 2015-11-02 LAB — BASIC METABOLIC PANEL
Anion gap: 7 (ref 5–15)
BUN: 8 mg/dL (ref 6–20)
CALCIUM: 7.5 mg/dL — AB (ref 8.9–10.3)
CO2: 23 mmol/L (ref 22–32)
CREATININE: 0.52 mg/dL (ref 0.44–1.00)
Chloride: 108 mmol/L (ref 101–111)
GFR calc Af Amer: >60 mL/min (ref >60)
GLUCOSE: 120 mg/dL — AB (ref 65–99)
Potassium: 2.8 mmol/L — CL (ref 3.5–5.1)
SODIUM: 138 mmol/L (ref 135–145)

## 2015-11-02 LAB — MAGNESIUM: Magnesium: 1.9 mg/dL (ref 1.7–2.4)

## 2015-11-02 LAB — TSH: TSH: 0.918 u[IU]/mL (ref 0.350–4.500)

## 2015-11-02 LAB — POTASSIUM: POTASSIUM: 3.7 mmol/L (ref 3.5–5.1)

## 2015-11-02 MED ORDER — POTASSIUM CHLORIDE CRYS ER 20 MEQ PO TBCR
40.0000 meq | EXTENDED_RELEASE_TABLET | Freq: Once | ORAL | Status: AC
  Administered 2015-11-02: 40 meq via ORAL
  Filled 2015-11-02: qty 2

## 2015-11-02 MED ORDER — PROCHLORPERAZINE EDISYLATE 5 MG/ML IJ SOLN
10.0000 mg | Freq: Once | INTRAMUSCULAR | Status: AC
  Administered 2015-11-02: 10 mg via INTRAVENOUS
  Filled 2015-11-02: qty 2

## 2015-11-02 MED ORDER — LEVOCETIRIZINE DIHYDROCHLORIDE 5 MG PO TABS: 5.0000 mg | ORAL_TABLET | Freq: Every evening | ORAL | Status: DC

## 2015-11-02 MED ORDER — ENOXAPARIN SODIUM 40 MG/0.4ML ~~LOC~~ SOLN
40.0000 mg | SUBCUTANEOUS | Status: DC
  Administered 2015-11-02: 40 mg via SUBCUTANEOUS
  Filled 2015-11-02 (×2): qty 0.4

## 2015-11-02 MED ORDER — ONDANSETRON HCL 4 MG PO TABS
4.0000 mg | ORAL_TABLET | Freq: Four times a day (QID) | ORAL | Status: DC | PRN
Start: 2015-11-02 — End: 2015-11-03

## 2015-11-02 MED ORDER — LORATADINE 10 MG PO TABS
10.0000 mg | ORAL_TABLET | Freq: Every day | ORAL | Status: DC
  Administered 2015-11-02: 10 mg via ORAL
  Filled 2015-11-02 (×2): qty 1

## 2015-11-02 MED ORDER — SODIUM CHLORIDE 0.9% FLUSH
3.0000 mL | Freq: Two times a day (BID) | INTRAVENOUS | Status: DC
  Administered 2015-11-02 (×2): 3 mL via INTRAVENOUS

## 2015-11-02 MED ORDER — MORPHINE SULFATE (PF) 2 MG/ML IV SOLN
2.0000 mg | INTRAVENOUS | Status: DC | PRN
  Administered 2015-11-02 (×2): 2 mg via INTRAVENOUS
  Filled 2015-11-02 (×2): qty 1

## 2015-11-02 MED ORDER — TRAMADOL HCL 50 MG PO TABS: 50.0000 mg | ORAL_TABLET | Freq: Four times a day (QID) | ORAL | Status: DC | PRN

## 2015-11-02 MED ORDER — ACETAMINOPHEN 650 MG RE SUPP: 650.0000 mg | Freq: Four times a day (QID) | RECTAL | Status: DC | PRN

## 2015-11-02 MED ORDER — MORPHINE SULFATE (PF) 4 MG/ML IV SOLN
4.0000 mg | INTRAVENOUS | Status: DC | PRN
  Administered 2015-11-02 (×2): 4 mg via INTRAVENOUS
  Filled 2015-11-02 (×2): qty 1

## 2015-11-02 MED ORDER — SODIUM CHLORIDE 0.9 % IV SOLN
INTRAVENOUS | Status: DC
  Administered 2015-11-02: 02:00:00 via INTRAVENOUS

## 2015-11-02 MED ORDER — PIPERACILLIN-TAZOBACTAM 3.375 G IVPB
3.3750 g | Freq: Once | INTRAVENOUS | Status: AC
  Administered 2015-11-02: 3.375 g via INTRAVENOUS
  Filled 2015-11-02: qty 50

## 2015-11-02 MED ORDER — PIPERACILLIN-TAZOBACTAM 3.375 G IVPB
3.3750 g | Freq: Three times a day (TID) | INTRAVENOUS | Status: DC
  Administered 2015-11-02 – 2015-11-03 (×4): 3.375 g via INTRAVENOUS
  Filled 2015-11-02 (×7): qty 50

## 2015-11-02 MED ORDER — ONDANSETRON HCL 4 MG/2ML IJ SOLN
4.0000 mg | Freq: Four times a day (QID) | INTRAMUSCULAR | Status: DC | PRN
  Administered 2015-11-02 (×2): 4 mg via INTRAVENOUS
  Filled 2015-11-02 (×2): qty 2

## 2015-11-02 MED ORDER — NIFEDIPINE ER 30 MG PO TB24
30.0000 mg | ORAL_TABLET | Freq: Every day | ORAL | Status: DC
  Administered 2015-11-02: 30 mg via ORAL
  Filled 2015-11-02 (×3): qty 1

## 2015-11-02 MED ORDER — ONDANSETRON HCL 4 MG/2ML IJ SOLN
4.0000 mg | Freq: Once | INTRAMUSCULAR | Status: AC
  Administered 2015-11-02: 4 mg via INTRAVENOUS
  Filled 2015-11-02: qty 2

## 2015-11-02 MED ORDER — POTASSIUM CHLORIDE IN NACL 20-0.9 MEQ/L-% IV SOLN
INTRAVENOUS | Status: DC
  Administered 2015-11-02 – 2015-11-03 (×3): via INTRAVENOUS
  Filled 2015-11-02 (×6): qty 1000

## 2015-11-02 MED ORDER — MORPHINE SULFATE (PF) 4 MG/ML IV SOLN
4.0000 mg | Freq: Once | INTRAVENOUS | Status: AC
  Administered 2015-11-02: 4 mg via INTRAVENOUS
  Filled 2015-11-02: qty 1

## 2015-11-02 MED ORDER — TRAMADOL HCL 50 MG PO TABS
50.0000 mg | ORAL_TABLET | Freq: Four times a day (QID) | ORAL | Status: DC | PRN
  Administered 2015-11-02: 50 mg via ORAL
  Filled 2015-11-02: qty 1

## 2015-11-02 MED ORDER — ACETAMINOPHEN 325 MG PO TABS: 650.0000 mg | ORAL_TABLET | Freq: Four times a day (QID) | ORAL | Status: DC | PRN

## 2015-11-02 NOTE — Progress Notes (Signed)
MD notified of critical lab value K 2.8. Given orders for NS w/ 20KCl @ 125. K to be rechecked at 1200.

## 2015-11-02 NOTE — ED Notes (Signed)
Dr. Brown at bedside

## 2015-11-02 NOTE — Progress Notes (Signed)
Pharmacy Antibiotic Note  Bridget Maldonado is a 35 y.o. female admitted on 11/01/2015 with SIRS.  Pharmacy has been consulted for Zosyn dosing.  Plan: Zosyn 3.375g IV q8h (4 hour infusion).  Height: 5\' 4"  (162.6 cm) Weight: 185 lb (83.915 kg) IBW/kg (Calculated) : 54.7  Temp (24hrs), Avg:99.8 F (37.7 C), Min:98.5 F (36.9 C), Max:101.6 F (38.7 C)   Recent Labs Lab 11/01/15 1621 11/01/15 2211  WBC 11.2*  --   CREATININE 0.76  --   LATICACIDVEN  --  1.2    Estimated Creatinine Clearance: 105.8 mL/min (by C-G formula based on Cr of 0.76).    Allergies  Allergen Reactions  . Sulfa Antibiotics Nausea And Vomiting  . Ciprofloxacin Nausea And Vomiting  . Effexor [Venlafaxine Hcl] Other (See Comments)    Reaction:  Made pt sleep for two days straight.    Antimicrobials this admission: Zosyn 5/12 >>   Dose adjustments this admission:  Microbiology results:  Thank you for allowing pharmacy to be a part of this patient's care.  Paulina Fusi, PharmD, BCPS 11/02/2015 2:27 AM

## 2015-11-02 NOTE — ED Notes (Signed)
Patient transported to room 223. 

## 2015-11-02 NOTE — ED Provider Notes (Signed)
Assumed care the pat 11:00 PM from Dr. Archie Balboa patient with bilateral flank pain fever and tachycardia that has been persistent in the emergency department despite IV hydration approximately 3 L normal saline in addition to antipyretics. Patient states that her symptoms are consistent with when she had pyelonephritis in the past. Patient admits to multiple episodes of pyelonephritis requiring hospitalizations. Blood cultures obtained and urine culture as well. Plan to admit the patient to the hospital for continued management while awaiting culture results.  Gregor Hams, MD 11/02/15 [phone]

## 2015-11-02 NOTE — H&P (Signed)
[facility] [place] NAME: Bridget Maldonado    MR#:  [id number]  DATE OF BIRTH:  Apr 10, 1981  DATE OF ADMISSION:  11/01/2015  PRIMARY CARE PHYSICIAN: Viviana Simpler, MD   REQUESTING/REFERRING PHYSICIAN:   CHIEF COMPLAINT:   Chief Complaint  Patient presents with  . Flank Pain  . Fever    HISTORY OF PRESENT ILLNESS: Bridget Maldonado  is a 35 y.o. female with a known history of multiple episodes of pyelonephritis, bipolar disorder, asthma, migraine, help syndrome during pregnancy presented to the emergency room because of fever and low back pain. Low back pain started all of a sudden and is throbbing in nature, 10 out of 10 on a scale of 1-10. Patient also complains of pain in the left costovertebral angle area. She also has some low-grade fever and chills but no complaints of dysuria. Patient's urinalysis did not reveal any infection today in the emergency room. No history of chest pain. No complaints of shortness of breath. No history of headache dizziness blurry vision. No history of syncope or seizure. Patient was tachycardic in the emergency room she was given 3 L of IV fluids and was given IV Zosyn antibiotic and cultures were sent. Hospitalist service was consulted for further care. No history of recent travel or sick contacts at home.  PAST MEDICAL HISTORY:   Past Medical History  Diagnosis Date  . 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 Chronic headaches   . Psoriasis   . Obese   . 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 Hemorrhoids 08/2007  . IBS (irritable bowel syndrome) 08/2007  . Bipolar disorder ([place])   . Multiple allergies   . Asthma   . Migraines   . Kidney infection     PAST SURGICAL HISTORY: Past Surgical History  Procedure Laterality Date  . Laparoscopic cholecystectomy  6/09    Dr. Hulen Skains  . Tympanostomy tube placement    . Ear tube removal    . Colonoscopy w/ biopsies    . Esophagogastroduodenoscopy    .  Colonoscopy    . Cesarean section      SOCIAL HISTORY:  Social History  Substance Use Topics  . Smoking status: Never Smoker   . Smokeless tobacco: Never Used  . Alcohol Use: No     Comment: 2 per month    FAMILY HISTORY:  Family History  Problem Relation Age of Onset  . Breast cancer Mother   . Rheum arthritis Mother   . Asthma Father   . Anxiety disorder Father     Severe, possible bipolar  . Colon polyps Maternal Grandfather   . Irritable bowel syndrome Mother   . Irritable bowel syndrome Maternal Grandmother   . Diabetes      mat. great uncles  . Alzheimer's disease      Dad's side  . Parkinson's disease Maternal Grandfather   . Colon cancer      mat GGF    DRUG ALLERGIES:  Allergies  Allergen Reactions  . Sulfa Antibiotics Nausea And Vomiting  . Ciprofloxacin Nausea And Vomiting  . Effexor [Venlafaxine Hcl] Other (See Comments)    Reaction:  Made pt sleep for two days straight.    REVIEW OF SYSTEMS:   CONSTITUTIONAL: Has fever, has weakness.  EYES: No blurred or double vision.  EARS, NOSE, AND THROAT: No tinnitus or ear pain.  RESPIRATORY: No cough, shortness of breath, wheezing or hemoptysis.  CARDIOVASCULAR: No chest  pain, orthopnea, edema. Has tachycardia GASTROINTESTINAL: No nausea, vomiting, diarrhea or abdominal pain. Left CVA tenderness noted GENITOURINARY: No dysuria, hematuria.  ENDOCRINE: No polyuria, nocturia,  HEMATOLOGY: No anemia, easy bruising or bleeding SKIN: No rash or lesion. MUSCULOSKELETAL: No joint pain or arthritis.   NEUROLOGIC: No tingling, numbness, weakness.  PSYCHIATRY: No anxiety or depression.   MEDICATIONS AT HOME:  Prior to Admission medications   Medication Sig Start Date End Date Taking? Authorizing Provider  dicyclomine (BENTYL) 20 MG tablet Take 20 mg by mouth every 6 (six) hours.   Yes Historical Provider, MD  levocetirizine (XYZAL) 5 MG tablet Take 1 tablet (5 mg total) by mouth every evening. 09/27/15  Yes Ria Bush, MD  NIFEdipine (PROCARDIA-XL/ADALAT CC) 30 MG 24 hr tablet Take 30 mg by mouth daily. Reported on 08/27/2015   Yes Historical Provider, MD  traMADol (ULTRAM) 50 MG tablet Take 1 tablet (50 mg total) by mouth 3 (three) times daily as needed. 10/09/15  Yes Venia Carbon, MD  dicyclomine (BENTYL) 20 MG tablet Take 1 tablet (20 mg total) by mouth 3 (three) times daily as needed (abdominal pain). 11/01/15   Nance Pear, MD      PHYSICAL EXAMINATION:   VITAL SIGNS: Blood pressure 128/90, pulse 109, temperature 98.6 F (37 C), temperature source Oral, resp. rate 20, height 5\' 4"  (1.626 m), weight 83.915 kg (185 lb), last menstrual period 10/18/2015, SpO2 100 %.  GENERAL:  35 y.o.-year-old patient lying in the bed with no acute distress.  EYES: Pupils equal, round, reactive to light and accommodation. No scleral icterus. Extraocular muscles intact.  HEENT: Head atraumatic, normocephalic. Oropharynx and nasopharynx clear.  NECK:  Supple, no jugular venous distention. No thyroid enlargement, no tenderness.  LUNGS: Normal breath sounds bilaterally, no wheezing, rales,rhonchi or crepitation. No use of accessory muscles of respiration.  CARDIOVASCULAR: S1, S2 tachycardia noted. No murmurs, rubs, or gallops.  ABDOMEN: Soft, tenderness in the left costo vertebral angle area noted, nondistended. Bowel sounds present. No organomegaly or mass.  EXTREMITIES: No pedal edema, cyanosis, or clubbing.  NEUROLOGIC: Cranial nerves II through XII are intact. Muscle strength 5/5 in all extremities. Sensation intact. Gait normal. PSYCHIATRIC: The patient is alert and oriented x 3.  SKIN: No obvious rash, lesion, or ulcer.   LABORATORY PANEL:   CBC  Recent Labs Lab 11/01/15 1621  WBC 11.2*  HGB 14.1  HCT 43.0  PLT 242  MCV 84.1  MCH 27.7  MCHC 32.9  RDW 13.8   ------------------------------------------------------------------------------------------------------------------  Chemistries    Recent Labs Lab 11/01/15 1621  NA 135  K 3.3*  CL 106  CO2 18*  GLUCOSE 120*  BUN 13  CREATININE 0.76  CALCIUM 8.8*   ------------------------------------------------------------------------------------------------------------------ estimated creatinine clearance is 105.8 mL/min (by C-G formula based on Cr of 0.76). ------------------------------------------------------------------------------------------------------------------  Recent Labs  11/01/15 1612  TSH 0.918     Coagulation profile No results for input(s): INR, PROTIME in the last 168 hours. ------------------------------------------------------------------------------------------------------------------- No results for input(s): DDIMER in the last 72 hours. -------------------------------------------------------------------------------------------------------------------  Cardiac Enzymes No results for input(s): CKMB, TROPONINI, MYOGLOBIN in the last 168 hours.  Invalid input(s): CK ------------------------------------------------------------------------------------------------------------------ Invalid input(s): POCBNP  ---------------------------------------------------------------------------------------------------------------  Urinalysis    Component Value Date/Time   COLORURINE YELLOW* 11/01/2015 1823   COLORURINE Yellow 07/11/2013 1844   APPEARANCEUR CLEAR* 11/01/2015 1823   APPEARANCEUR Hazy 07/11/2013 1844   LABSPEC 1.019 11/01/2015 1823   LABSPEC 1.027 07/11/2013 1844   PHURINE 5.0 11/01/2015 1823   PHURINE 5.0  07/11/2013 1844   GLUCOSEU NEGATIVE 11/01/2015 1823   GLUCOSEU Negative 07/11/2013 1844   HGBUR NEGATIVE 11/01/2015 1823   HGBUR Negative 07/11/2013 1844   HGBUR moderate 02/08/2010 1331   BILIRUBINUR NEGATIVE 11/01/2015 1823   BILIRUBINUR Negative 07/11/2013 1844   BILIRUBINUR negative 03/25/2012 1145   KETONESUR NEGATIVE 11/01/2015 1823   KETONESUR Trace 07/11/2013 1844    PROTEINUR NEGATIVE 11/01/2015 1823   PROTEINUR 30 mg/dL 07/11/2013 1844   PROTEINUR negative 03/25/2012 1145   UROBILINOGEN negative 03/25/2012 1145   UROBILINOGEN 0.2 02/08/2010 1331   NITRITE NEGATIVE 11/01/2015 1823   NITRITE Negative 07/11/2013 1844   NITRITE negative 03/25/2012 1145   LEUKOCYTESUR NEGATIVE 11/01/2015 1823   LEUKOCYTESUR Negative 07/11/2013 1844     RADIOLOGY: Ct Abdomen Pelvis W Contrast  11/01/2015  CLINICAL DATA:  Bilateral flank pain.  Fever.  Pyelonephritis. EXAM: CT ABDOMEN AND PELVIS WITH CONTRAST TECHNIQUE: Multidetector CT imaging of the abdomen and pelvis was performed using the standard protocol following bolus administration of intravenous contrast. CONTRAST:  16mL ISOVUE-300 IOPAMIDOL (ISOVUE-300) INJECTION 61% COMPARISON:  Noncontrast CT on 07/12/2013 FINDINGS: Lower chest:  No acute findings. Hepatobiliary: No masses or other significant abnormality. Focal fatty infiltration noted adjacent to falciform ligament. Prior cholecystectomy noted. No evidence of biliary dilatation. Pancreas: No mass, inflammatory changes, or other significant abnormality. Spleen: Within upper limits of normal in size. Normal appearance. No splenic lesions identified. Adrenals/Urinary Tract: No masses identified. No evidence of hydronephrosis. No evidence of renal swelling, perinephric fluid, or inflammatory changes. No evidence of ureteral calculi or dilatation. Unopacified urinary bladder is unremarkable in appearance. Stomach/Bowel: No evidence of obstruction, inflammatory process, or abnormal fluid collections. Vascular/Lymphatic: No pathologically enlarged lymph nodes. No evidence of abdominal aortic aneurysm. Reproductive: No mass or other significant abnormality. Bilateral tubal ligation clips noted. Other: None. Musculoskeletal:  No suspicious bone lesions identified. IMPRESSION: Negative. No radiographic evidence of pyelonephritis, renal abscess, hydronephrosis, or other acute  findings. Electronically Signed   By: Earle Gell M.D.   On: 11/01/2015 21:15    EKG: Orders placed or performed in visit on 08/17/14  . EKG 12-Lead    IMPRESSION AND PLAN: 35 year old female patient with history of pyelonephritis, migraine,, bronchial asthma, bipolar disorder presented to the emergency room with low back pain and fever. She was also tachycardic in the emergency room. Admitting diagnosis 1. Systemic inflammatory response syndrome 2. Tachycardia 3. Dehydration 4. Hypokalemia 5. Bronchial asthma which is stable Treatment plan Admit patient to medical floor observation bed IV fluid hydration Empirically start patient on IV Zosyn antibiotic, follow-up cultures Supplement potassium Check electrolytes DVT prophylaxis subcutaneous Lovenox 40 MG daily Supportive care.  All the records are reviewed and case discussed with ED provider. Management plans discussed with the patient, family and they are in agreement.  CODE STATUS:FULL Code Status History    Date Active Date Inactive Code Status Order ID Comments User Context   12/26/2014  5:27 PM 12/27/2014  3:12 AM Full Code [id number]  Dalia Heading, CNM Inpatient   11/15/2014  6:55 PM 11/21/2014  1:43 PM Full Code [id number]  Louisa Second, CNM Inpatient       TOTAL TIME TAKING CARE OF THIS PATIENT: 50 minutes.    Saundra Shelling M.D on 11/02/2015 at 1:18 AM  Between 7am to 6pm - Pager - [phone]  After 6pm go to [email or web address] - password [email or web address] [facility]  Office  [phone]  CC: Primary care physician; Viviana Simpler, MD

## 2015-11-02 NOTE — Progress Notes (Signed)
[facility] [place] NAME: Bridget Maldonado    MRN#:  [id number]  DATE OF BIRTH:  10-23-1980  SUBJECTIVE:  Hospital Day: none Bridget Maldonado is a 36 y.o. female presenting with Flank Pain and Fever .   Overnight events: No overnight events Interval Events: Continue complaints back/flank pain and left worse than right  REVIEW OF SYSTEMS:  CONSTITUTIONAL: Positive fever, fatigue or weakness.  EYES: No blurred or double vision.  EARS, NOSE, AND THROAT: No tinnitus or ear pain.  RESPIRATORY: No cough, shortness of breath, wheezing or hemoptysis.  CARDIOVASCULAR: No chest pain, orthopnea, edema.  GASTROINTESTINAL: No nausea, vomiting, diarrhea or abdominal pain.  GENITOURINARY: No dysuria, hematuria.  ENDOCRINE: No polyuria, nocturia,  HEMATOLOGY: No anemia, easy bruising or bleeding SKIN: No rash or lesion. MUSCULOSKELETAL: No joint pain or arthritis.  Positive back pain NEUROLOGIC: No tingling, numbness, weakness.  PSYCHIATRY: No anxiety or depression.   DRUG ALLERGIES:   Allergies  Allergen Reactions  . Sulfa Antibiotics Nausea And Vomiting  . Ciprofloxacin Nausea And Vomiting  . Effexor [Venlafaxine Hcl] Other (See Comments)    Reaction:  Made pt sleep for two days straight.    VITALS:  Blood pressure 133/91, pulse 88, temperature 98.2 F (36.8 C), temperature source Oral, resp. rate 16, height 5\' 4"  (1.626 m), weight 89.449 kg (197 lb 3.2 oz), last menstrual period 10/18/2015, SpO2 100 %.  PHYSICAL EXAMINATION:  VITAL SIGNS: Filed Vitals:   11/02/15 0640 11/02/15 1155  BP: 115/81 133/91  Pulse: 85 88  Temp: 98.2 F (36.8 C) 98.2 F (36.8 C)  Resp: 20 16   GENERAL:34 y.o.female currently in no acute distress.  HEAD: Normocephalic, atraumatic.  EYES: Pupils equal, round, reactive to light. Extraocular muscles intact. No scleral icterus.  MOUTH: Moist mucosal membrane. Dentition intact. No abscess noted.  EAR, NOSE,  THROAT: Clear without exudates. No external lesions.  NECK: Supple. No thyromegaly. No nodules. No JVD.  PULMONARY: Clear to ascultation, without wheeze rails or rhonci. No use of accessory muscles, Good respiratory effort. good air entry bilaterally CHEST: Nontender to palpation.  CARDIOVASCULAR: S1 and S2. Regular rate and rhythm. No murmurs, rubs, or gallops. No edema. Pedal pulses 2+ bilaterally.  GASTROINTESTINAL: Soft, nontender, nondistended. No masses. Positive bowel sounds. No hepatosplenomegaly.  MUSCULOSKELETAL: No swelling, clubbing, or edema. Range of motion full in all extremities.  NEUROLOGIC: Cranial nerves II through XII are intact. No gross focal neurological deficits. Sensation intact. Reflexes intact.  SKIN: No ulceration, lesions, rashes, or cyanosis. Skin warm and dry. Turgor intact.  PSYCHIATRIC: Mood, affect within normal limits. The patient is awake, alert and oriented x 3. Insight, judgment intact.      LABORATORY PANEL:   CBC  Recent Labs Lab 11/02/15 0348  WBC 5.8  HGB 12.3  HCT 36.1  PLT 186   ------------------------------------------------------------------------------------------------------------------  Chemistries   Recent Labs Lab 11/02/15 0348 11/02/15 1141  NA 138  --   K 2.8* 3.7  CL 108  --   CO2 23  --   GLUCOSE 120*  --   BUN 8  --   CREATININE 0.52  --   CALCIUM 7.5*  --   MG  --  1.9   ------------------------------------------------------------------------------------------------------------------  Cardiac Enzymes No results for input(s): TROPONINI in the last 168 hours. ------------------------------------------------------------------------------------------------------------------  RADIOLOGY:  Ct Abdomen Pelvis W Contrast  11/01/2015  CLINICAL DATA:  Bilateral flank pain.  Fever.  Pyelonephritis. EXAM: CT ABDOMEN AND PELVIS WITH  CONTRAST TECHNIQUE: Multidetector CT imaging of the abdomen and pelvis was performed using  the standard protocol following bolus administration of intravenous contrast. CONTRAST:  12mL ISOVUE-300 IOPAMIDOL (ISOVUE-300) INJECTION 61% COMPARISON:  Noncontrast CT on 07/12/2013 FINDINGS: Lower chest:  No acute findings. Hepatobiliary: No masses or other significant abnormality. Focal fatty infiltration noted adjacent to falciform ligament. Prior cholecystectomy noted. No evidence of biliary dilatation. Pancreas: No mass, inflammatory changes, or other significant abnormality. Spleen: Within upper limits of normal in size. Normal appearance. No splenic lesions identified. Adrenals/Urinary Tract: No masses identified. No evidence of hydronephrosis. No evidence of renal swelling, perinephric fluid, or inflammatory changes. No evidence of ureteral calculi or dilatation. Unopacified urinary bladder is unremarkable in appearance. Stomach/Bowel: No evidence of obstruction, inflammatory process, or abnormal fluid collections. Vascular/Lymphatic: No pathologically enlarged lymph nodes. No evidence of abdominal aortic aneurysm. Reproductive: No mass or other significant abnormality. Bilateral tubal ligation clips noted. Other: None. Musculoskeletal:  No suspicious bone lesions identified. IMPRESSION: Negative. No radiographic evidence of pyelonephritis, renal abscess, hydronephrosis, or other acute findings. Electronically Signed   By: Earle Gell M.D.   On: 11/01/2015 21:15    EKG:   Orders placed or performed during the hospital encounter of 11/01/15  . EKG 12-Lead  . EKG 12-Lead    ASSESSMENT AND PLAN:   Bridget Maldonado is a 35 y.o. female presenting with Flank Pain and Fever . Admitted 11/01/2015 : Day #: none 1. Sepsis: Present on admission secondary to fever or tachycardia respiratory rate: Unknown etiology at this time, continue with antibiotic coverage taper antibiotics when cultures returned 2. Hypokalemia replace potassium goal 4-5, check magnesium 3. Venous thromboembolism prophylactic:  Lovenox   All the records are reviewed and case discussed with Care Management/Social Workerr. Management plans discussed with the patient, family and they are in agreement.  CODE STATUS: full TOTAL TIME TAKING CARE OF THIS PATIENT: 28 minutes.   POSSIBLE D/C IN 2-3DAYS, DEPENDING ON CLINICAL CONDITION.   Hower,  Karenann Cai.D on 11/02/2015 at 12:55 PM  Between 7am to 6pm - Pager - [phone]  After 6pm: House Pager: - [facility]  Office  [phone]  CC: Primary care physician; Viviana Simpler, MD

## 2015-11-03 LAB — BLOOD CULTURE ID PANEL (REFLEXED)
ACINETOBACTER BAUMANNII: NOT DETECTED
CANDIDA GLABRATA: NOT DETECTED
CANDIDA KRUSEI: NOT DETECTED
CARBAPENEM RESISTANCE: NOT DETECTED
Candida albicans: NOT DETECTED
Candida parapsilosis: NOT DETECTED
Candida tropicalis: NOT DETECTED
ENTEROBACTERIACEAE SPECIES: NOT DETECTED
ENTEROCOCCUS SPECIES: NOT DETECTED
ESCHERICHIA COLI: NOT DETECTED
Enterobacter cloacae complex: NOT DETECTED
HAEMOPHILUS INFLUENZAE: NOT DETECTED
KLEBSIELLA OXYTOCA: NOT DETECTED
Klebsiella pneumoniae: NOT DETECTED
LISTERIA MONOCYTOGENES: NOT DETECTED
METHICILLIN RESISTANCE: NOT DETECTED
NEISSERIA MENINGITIDIS: NOT DETECTED
PSEUDOMONAS AERUGINOSA: NOT DETECTED
Proteus species: NOT DETECTED
STAPHYLOCOCCUS SPECIES: DETECTED — AB
STREPTOCOCCUS AGALACTIAE: NOT DETECTED
STREPTOCOCCUS PYOGENES: NOT DETECTED
STREPTOCOCCUS SPECIES: NOT DETECTED
Serratia marcescens: NOT DETECTED
Staphylococcus aureus (BCID): NOT DETECTED
Streptococcus pneumoniae: NOT DETECTED
Vancomycin resistance: NOT DETECTED

## 2015-11-03 LAB — URINE CULTURE: Culture: 5000 — AB

## 2015-11-03 NOTE — Discharge Instructions (Signed)
Please seek medical attention for any high fevers, chest pain, shortness of breath, change in behavior, persistent vomiting, bloody stool or any other new or concerning symptoms.   Abdominal Pain, Adult Many things can cause belly (abdominal) pain. Most times, the belly pain is not dangerous. Many cases of belly pain can be watched and treated at home. HOME CARE   Do not take medicines that help you go poop (laxatives) unless told to by your doctor.  Only take medicine as told by your doctor.  Eat or drink as told by your doctor. Your doctor will tell you if you should be on a special diet. GET HELP IF:  You do not know what is causing your belly pain.  You have belly pain while you are sick to your stomach (nauseous) or have runny poop (diarrhea).  You have pain while you pee or poop.  Your belly pain wakes you up at night.  You have belly pain that gets worse or better when you eat.  You have belly pain that gets worse when you eat fatty foods.  You have a fever. GET HELP RIGHT AWAY IF:   The pain does not go away within 2 hours.  You keep throwing up (vomiting).  The pain changes and is only in the right or left part of the belly.  You have bloody or tarry looking poop. MAKE SURE YOU:   Understand these instructions.  Will watch your condition.  Will get help right away if you are not doing well or get worse.   This information is not intended to replace advice given to you by your health care provider. Make sure you discuss any questions you have with your health care provider.   Document Released: 11/26/2007 Document Revised: 06/30/2014 Document Reviewed: 02/16/2013 Elsevier Interactive Patient Education 2016 [employer].  Fever, Adult A fever is an increase in the body's temperature. It is often defined as a temperature of 100 F (38C) or higher. Short mild or moderate fevers often have no long-term effects. They also often do not need treatment. Moderate or  high fevers may make you feel uncomfortable. Sometimes, they can also be a sign of a serious illness or disease. The sweating that may happen with repeated fevers or fevers that last a while may also cause you to not have enough fluid in your body (dehydration). You can take your temperature with a thermometer to see if you have a fever. A measured temperature can change with:  Age.  Time of day.  Where the thermometer is placed:  Mouth (oral).  Rectum (rectal).  Ear (tympanic).  Underarm (axillary).  Forehead (temporal). HOME CARE Pay attention to any changes in your symptoms. Take these actions to help with your condition:  Take over-the-counter and prescription medicines only as told by your doctor. Follow the dosing instructions carefully.  If you were prescribed an antibiotic medicine, take it as told by your doctor. Do not stop taking the antibiotic even if you start to feel better.  Rest as needed.  Drink enough fluid to keep your pee (urine) clear or pale yellow.  Sponge yourself or bathe with room-temperature water as needed. This helps to lower your body temperature . Do not use ice water.  Do not wear too many blankets or heavy clothes. GET HELP IF:  You throw up (vomit).  You cannot eat or drink without throwing up.  You have watery poop (diarrhea).  It hurts when you pee.  Your symptoms do  not get better with treatment.  You have new symptoms.  You feel very weak. GET HELP RIGHT AWAY IF:  You are short of breath or have trouble breathing.  You are dizzy or you pass out (faint).  You feel confused.  You have signs of not having enough fluid in your body, such as:  A dry mouth.  Peeing less.  Looking pale.  You have very bad pain in your belly (abdomen).  You keep throwing up or having water poop.  You have a skin rash.  Your symptoms suddenly get worse.   This information is not intended to replace advice given to you by your health  care provider. Make sure you discuss any questions you have with your health care provider.   Document Released: 03/18/2008 Document Revised: 02/28/2015 Document Reviewed: 08/03/2014 Elsevier Interactive Patient Education 2016 [employer].   Follow all MD discharge instructions. Take all medications as prescribed. Keep all follow up appointments. If your symptoms return, call your doctor. If you experience any new symptoms that are of concern to you or that are bothersome to you, call your doctor. For all questions and/or concerns, call your doctor.   If you have a medical emergency, call 911

## 2015-11-03 NOTE — Progress Notes (Signed)
Pt d/c home; d/c instructions reviewed w/ pt; pt understanding was verbalized; IV removed catheter in tact, gauze dressing applied; all pt questions answered; pt left unit via wheelchair accompanied by staff 

## 2015-11-03 NOTE — Discharge Summary (Signed)
[facility] NAME: Bridget Maldonado    MR#:  [id number]  DATE OF BIRTH:  [birth year]  DATE OF ADMISSION:  11/01/2015 ADMITTING PHYSICIAN: Saundra Shelling, MD  DATE OF DISCHARGE: 11/03/2015  PRIMARY CARE PHYSICIAN: Viviana Simpler, MD    ADMISSION DIAGNOSIS:  Abdominal pain, unspecified abdominal location [R10.9] Fever, unspecified fever cause [R50.9]  DISCHARGE DIAGNOSIS:  Principal Problem:   SIRS (systemic inflammatory response syndrome) ([place])   SECONDARY DIAGNOSIS:   Past Medical History  Diagnosis Date  . 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 Chronic headaches   . Psoriasis   . Obese   . 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 Hemorrhoids 08/2007  . IBS (irritable bowel syndrome) 08/2007  . Bipolar disorder ([place])   . Multiple allergies   . Asthma   . Migraines   . Kidney infection     HOSPITAL COURSE:   35 y.o. female with a known history of multiple episodes of pyelonephritis, bipolar disorder, asthma, migraine, help syndrome during pregnancy presented to the emergency room because of fever and low back pain.  1. Fever: Patient was ruled out for sepsis with negative blood cultures, UA and CT of the abdomen. She had no fevers while in the hospital. Etiology of fever upon admission was not found. She was afebrile during hospital physician.  2. Hypokalemia and magnesium: This was repleted.  3. Chronic migraines: Continue nifedipine.   DISCHARGE CONDITIONS AND DIET:  Home stable Condition  CONSULTS OBTAINED:     DRUG ALLERGIES:   Allergies  Allergen Reactions  . Sulfa Antibiotics Nausea And Vomiting  . Ciprofloxacin Nausea And Vomiting  . Effexor [Venlafaxine Hcl] Other (See Comments)    Reaction:  Made pt sleep for two days straight.    DISCHARGE MEDICATIONS:   Current Discharge Medication List    CONTINUE these medications which have NOT CHANGED   Details  levocetirizine (XYZAL) 5 MG tablet Take 1  tablet (5 mg total) by mouth every evening. Qty: 30 tablet, Refills: 3    traMADol (ULTRAM) 50 MG tablet Take 1 tablet (50 mg total) by mouth 3 (three) times daily as needed. Qty: 30 tablet, Refills: 0    dicyclomine (BENTYL) 20 MG tablet Take 20 mg by mouth every 6 (six) hours. Reported on 11/02/2015    NIFEdipine (PROCARDIA-XL/ADALAT CC) 30 MG 24 hr tablet Take 30 mg by mouth daily. Reported on 11/02/2015              Today   CHIEF COMPLAINT:  Doing well this morning. No fevers overnight. No flank pain or urinary symptoms.   VITAL SIGNS:  Blood pressure 127/77, pulse 94, temperature 98.3 F (36.8 C), temperature source Oral, resp. rate 20, height 5\' 4"  (1.626 m), weight 89.449 kg (197 lb 3.2 oz), last menstrual period 10/18/2015, SpO2 95 %.   REVIEW OF SYSTEMS:  Review of Systems  Constitutional: Negative for fever, chills and malaise/fatigue.  HENT: Negative for ear discharge, ear pain, hearing loss, nosebleeds and sore throat.   Eyes: Negative for blurred vision and pain.  Respiratory: Negative for cough, hemoptysis, shortness of breath and wheezing.   Cardiovascular: Negative for chest pain, palpitations and leg swelling.  Gastrointestinal: Negative for nausea, vomiting, abdominal pain, diarrhea and blood in stool.  Genitourinary: Negative for dysuria.  Musculoskeletal: Negative for back pain.  Neurological: Negative for dizziness, tremors, speech change, focal weakness, seizures and headaches.  Endo/Heme/Allergies: Does not bruise/bleed easily.  Psychiatric/Behavioral: Negative for depression, suicidal ideas and hallucinations.     PHYSICAL EXAMINATION:  GENERAL:  35 y.o.-year-old patient lying in the bed with no acute distress.  NECK:  Supple, no jugular venous distention. No thyroid enlargement, no tenderness.  LUNGS: Normal breath sounds bilaterally, no wheezing, rales,rhonchi  No use of accessory muscles of respiration.  CARDIOVASCULAR: S1, S2 normal. No  murmurs, rubs, or gallops.  ABDOMEN: Soft, non-tender, non-distended. Bowel sounds present. No organomegaly or mass.  EXTREMITIES: No pedal edema, cyanosis, or clubbing.  PSYCHIATRIC: The patient is alert and oriented x 3.  SKIN: No obvious rash, lesion, or ulcer.   DATA REVIEW:   CBC  Recent Labs Lab 11/02/15 0348  WBC 5.8  HGB 12.3  HCT 36.1  PLT 186    Chemistries   Recent Labs Lab 11/02/15 0348 11/02/15 1141  NA 138  --   K 2.8* 3.7  CL 108  --   CO2 23  --   GLUCOSE 120*  --   BUN 8  --   CREATININE 0.52  --   CALCIUM 7.5*  --   MG  --  1.9    Cardiac Enzymes No results for input(s): TROPONINI in the last 168 hours.  Microbiology Results  @MICRORSLT48 @  RADIOLOGY:  Ct Abdomen Pelvis W Contrast  11/01/2015  CLINICAL DATA:  Bilateral flank pain.  Fever.  Pyelonephritis. EXAM: CT ABDOMEN AND PELVIS WITH CONTRAST TECHNIQUE: Multidetector CT imaging of the abdomen and pelvis was performed using the standard protocol following bolus administration of intravenous contrast. CONTRAST:  138mL ISOVUE-300 IOPAMIDOL (ISOVUE-300) INJECTION 61% COMPARISON:  Noncontrast CT on 07/12/2013 FINDINGS: Lower chest:  No acute findings. Hepatobiliary: No masses or other significant abnormality. Focal fatty infiltration noted adjacent to falciform ligament. Prior cholecystectomy noted. No evidence of biliary dilatation. Pancreas: No mass, inflammatory changes, or other significant abnormality. Spleen: Within upper limits of normal in size. Normal appearance. No splenic lesions identified. Adrenals/Urinary Tract: No masses identified. No evidence of hydronephrosis. No evidence of renal swelling, perinephric fluid, or inflammatory changes. No evidence of ureteral calculi or dilatation. Unopacified urinary bladder is unremarkable in appearance. Stomach/Bowel: No evidence of obstruction, inflammatory process, or abnormal fluid collections. Vascular/Lymphatic: No pathologically enlarged lymph  nodes. No evidence of abdominal aortic aneurysm. Reproductive: No mass or other significant abnormality. Bilateral tubal ligation clips noted. Other: None. Musculoskeletal:  No suspicious bone lesions identified. IMPRESSION: Negative. No radiographic evidence of pyelonephritis, renal abscess, hydronephrosis, or other acute findings. Electronically Signed   By: Earle Gell M.D.   On: 11/01/2015 21:15      Management plans discussed with the patient and she is in agreement. Stable for discharge home  Patient should follow up with PCP in 1 week  CODE STATUS:     Code Status Orders        Start     Ordered   11/02/15 0207  Full code   Continuous     11/02/15 0207    Code Status History    Date Active Date Inactive Code Status Order ID Comments User Context   12/26/2014  5:27 PM 12/27/2014  3:12 AM Full Code [id number]  Dalia Heading, CNM Inpatient   11/15/2014  6:55 PM 11/21/2014  1:43 PM Full Code [id number]  Louisa Second, CNM Inpatient      TOTAL TIME TAKING CARE OF THIS PATIENT: 36 minutes.    Note: This dictation was prepared with Dragon dictation along with smaller phrase technology. Any transcriptional errors that result from  this process are unintentional.  Amin Fornwalt M.D on 11/03/2015 at 11:08 AM  Between 7am to 6pm - Pager - [phone] After 6pm go to [email or web address] - password [email or web address] [facility]  Office  [phone]  CC: Primary care physician; Viviana Simpler, MD

## 2015-11-06 ENCOUNTER — Telehealth: Payer: Self-pay

## 2015-11-06 LAB — CULTURE, BLOOD (ROUTINE X 2): Culture: NO GROWTH

## 2015-11-06 NOTE — Telephone Encounter (Signed)
Transition Care Management Follow-up Telephone Call     Date discharged? 11/03/2015        How have you been since you were released from the hospital? No fevers but still feels tired   Any patient concerns? Questioning BP medication Procardia. Pt states she has not taken this since her pregnancy ended and is wondering if she should resume it.    Do you understand why you were in the hospital? Yes    Do you understand the discharge instructions? Yes   Where were you discharged to? No   Items Reviewed:  Medications reviewed: Yes  Allergies reviewed: Yes  Dietary changes reviewed: N/A  Referrals reviewed: N/A   Functional Questionnaire:  Independent - I Dependent - D    Activities of Daily Living (ADLs):  Independent with ADLs  Personal hygiene  Dressing Eating  Maintaining continence Transferring  Independent Activities of Daily Living (ADLs): Independent with ADLs Basic communication skills Transportation Meal preparation  Shopping Housework  Managing medications Managing personal finances   Confirmed importance and date/time of follow-up visits scheduled YES  Provider Appointment booked with PCP on 11/07/15 @ 1230PM  Confirmed with patient if condition begins to worsen call PCP or go to the ER.  Patient was given the office number and encouraged to call back with question or concerns: YES

## 2015-11-07 ENCOUNTER — Ambulatory Visit: Payer: Medicaid Other | Admitting: Internal Medicine

## 2015-11-07 ENCOUNTER — Encounter: Payer: Self-pay | Admitting: Family Medicine

## 2015-11-07 ENCOUNTER — Ambulatory Visit (INDEPENDENT_AMBULATORY_CARE_PROVIDER_SITE_OTHER): Payer: Medicaid Other | Admitting: Family Medicine

## 2015-11-07 VITALS — BP 138/98 | HR 96 | Temp 98.6°F | Ht 64.0 in | Wt 189.5 lb

## 2015-11-07 DIAGNOSIS — M2550 Pain in unspecified joint: Secondary | ICD-10-CM | POA: Diagnosis not present

## 2015-11-07 DIAGNOSIS — Z8261 Family history of arthritis: Secondary | ICD-10-CM | POA: Diagnosis not present

## 2015-11-07 DIAGNOSIS — L409 Psoriasis, unspecified: Secondary | ICD-10-CM

## 2015-11-07 DIAGNOSIS — R21 Rash and other nonspecific skin eruption: Secondary | ICD-10-CM

## 2015-11-07 LAB — HIGH SENSITIVITY CRP: CRP HIGH SENSITIVITY: 3.03 mg/L (ref 0.000–5.000)

## 2015-11-07 LAB — URIC ACID: Uric Acid, Serum: 4 mg/dL (ref 2.4–7.0)

## 2015-11-07 LAB — SEDIMENTATION RATE: SED RATE: 35 mm/h — AB (ref 0–22)

## 2015-11-07 LAB — RHEUMATOID FACTOR

## 2015-11-07 MED ORDER — TRAMADOL HCL 50 MG PO TABS: 50.0000 mg | ORAL_TABLET | Freq: Three times a day (TID) | ORAL | Status: DC | PRN

## 2015-11-07 NOTE — Progress Notes (Signed)
Pre visit review using our clinic review tool, if applicable. No additional management support is needed unless otherwise documented below in the visit note. 

## 2015-11-07 NOTE — Progress Notes (Signed)
Dr. Frederico Hamman T. Sarim Rothman, MD, [facility] [place], [address] Phone: [phone] Fax: [phone]  11/07/2015  Patient: Bridget Maldonado, MRN: [id number], DOB: [birth year], 35 y.o.  Primary Physician:  Viviana Simpler, MD   Chief Complaint  Patient presents with  . Back Pain    Spine 'Feels A Sleep"   Subjective:   Bridget Maldonado is a 35 y.o. very pleasant female patient who presents with the following:  The patient initially comes in for back pain, but she has diffuse polyarthropathy and has multiple complaints.  History is also significant for psoriasis and a mother who has rheumatoid arthritis.  Fell on her behind, then popped in the lumbar region. Went to the doctor. Xrays all looked ok. This occurred in 2013, and per her report she is been having some pain ever since then.  Worked at [employer] for about 8 years. Now taking tramadol and has to take it most of the time. Spine feels like it is asleep. Will come and go - will stay for  Awhile.   Off and on - heartbeat in her back. Shower and tub.  Heating pad. Ice.  Ibuprofen, alleve, OTC meds.  Muscle relaxants.   Hurts a lot in her hips,  Hands and knuckles - fingers will hurt. MCP and pip.  Midfoot pain. Red and hot sometimes.  Neck.   Mom - has RA, in her 55's. On MTX.  Patient has psoriasis.   History of facial rash when she gets warm.  Past Medical History, Surgical History, Social History, Family History, Problem List, Medications, and Allergies have been reviewed and updated if relevant.  Patient Active Problem List   Diagnosis Date Noted  . Abdominal pain 11/08/2015  . Chronic cough 10/15/2015  . Abnormal ECG 10/18/2014  . Essential (primary) hypertension 10/18/2014  . Acute asthma flare 05/25/2014  . Arthritis 11/24/2013  . Arthropathia 11/24/2013  . Obstipation 04/12/2013  . Migraine with status migrainosus 09/08/2012  . IBS (irritable bowel  syndrome)-diarrhea predominant 06/07/2012  . Mild intermittent asthma 05/26/2011  . Severe anxiety with panic 08/29/2006  . Panic disorder without agoraphobia 08/29/2006    Past Medical History  Diagnosis Date  . 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 Chronic headaches   . Psoriasis   . Obese   . 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 Hemorrhoids 08/2007  . IBS (irritable bowel syndrome) 08/2007  . Bipolar disorder ([place])   . Multiple allergies   . Asthma   . Migraines   . Kidney infection     Past Surgical History  Procedure Laterality Date  . Laparoscopic cholecystectomy  6/09    Dr. Hulen Skains  . Tympanostomy tube placement    . Ear tube removal    . Colonoscopy w/ biopsies    . Esophagogastroduodenoscopy    . Colonoscopy    . Cesarean section      Social History   Social History  . Marital Status: Single    Spouse Name: N/A  . Number of Children: 1  . Years of Education: N/A   Occupational History  . unemployed    Social History Main Topics  . Smoking status: Never Smoker   . Smokeless tobacco: Never Used  . Alcohol Use: No  . Drug Use: No  . Sexual Activity: Yes   Other Topics Concern  . Not on file   Social History Narrative   Lives with  parents.    Family History  Problem Relation Age of Onset  . Breast cancer Mother   . Rheum arthritis Mother   . Asthma Father   . Anxiety disorder Father     Severe, possible bipolar  . Colon polyps Maternal Grandfather   . Irritable bowel syndrome Mother   . Irritable bowel syndrome Maternal Grandmother   . Diabetes      mat. great uncles  . Alzheimer's disease      Dad's side  . Parkinson's disease Maternal Grandfather   . Colon cancer      mat GGF    Allergies  Allergen Reactions  . Sulfa Antibiotics Nausea And Vomiting  . Ciprofloxacin Nausea And Vomiting  . Effexor [Venlafaxine Hcl] Other (See Comments)    Reaction:  Made pt sleep for two days straight.    Medication list reviewed and updated  in full in [facility].  GEN: No fevers, chills. Nontoxic. Primarily MSK c/o today. MSK: Detailed in the HPI GI: tolerating PO intake without difficulty Neuro: No numbness, parasthesias, or tingling associated. Otherwise the pertinent positives of the ROS are noted above.   Objective:   BP 138/98 mmHg  Pulse 96  Temp(Src) 98.6 F (37 C) (Oral)  Ht 5\' 4"  (1.626 m)  Wt 189 lb 8 oz (85.957 kg)  BMI 32.51 kg/m2  LMP 10/18/2015   GEN: Well-developed,well-nourished,in no acute distress; alert,appropriate and cooperative throughout examination HEENT: Normocephalic and atraumatic without obvious abnormalities. Ears, externally no deformities PULM: Breathing comfortably in no respiratory distress EXT: No clubbing, cyanosis, or edema PSYCH: Normally interactive. Cooperative during the interview. Pleasant. Friendly and conversant. Not anxious or depressed appearing. Normal, full affect.  Range of motion at  the waist: Flexion, extension, lateral bending and rotation:  Fairly normal range of motion with modest restriction in for flexion and extension compared to what would be expected for age.  No echymosis or edema Rises to examination table with mild difficulty Gait: minimally antalgic  Inspection/Deformity: N Paraspinus Tenderness: mild, but diffuse from L1-S1  B Ankle Dorsiflexion (L5,4): 5/5 B Great Toe Dorsiflexion (L5,4): 5/5 Heel Walk (L5): WNL Toe Walk (S1): WNL Rise/Squat (L4): WNL, mild pain  SENSORY B Medial Foot (L4): WNL B Dorsum (L5): WNL B Lateral (S1): WNL Light Touch: WNL Pinprick: WNL  REFLEXES Knee (L4): 2+ Ankle (S1): 2+  B SLR, seated: neg B SLR, supine: neg B FABER: pain B Reverse FABER: neg B Greater Troch: NT B Log Roll: pain B Stork: NT B Sciatic Notch: NT   Hips have mild restriction of range of motion and pain in terminal in points bilaterally.  Positive C sign on the left.  Trochanteric bursa are also tender to palpation  bilaterally.  Hands and fingers do not appear to have any synovitis currently.  There is one area on one of her thumbs that appears to be some early nail pitting.  Radiology: Ct Abdomen Pelvis W Contrast  11/01/2015  CLINICAL DATA:  Bilateral flank pain.  Fever.  Pyelonephritis. EXAM: CT ABDOMEN AND PELVIS WITH CONTRAST TECHNIQUE: Multidetector CT imaging of the abdomen and pelvis was performed using the standard protocol following bolus administration of intravenous contrast. CONTRAST:  128mL ISOVUE-300 IOPAMIDOL (ISOVUE-300) INJECTION 61% COMPARISON:  Noncontrast CT on 07/12/2013 FINDINGS: Lower chest:  No acute findings. Hepatobiliary: No masses or other significant abnormality. Focal fatty infiltration noted adjacent to falciform ligament. Prior cholecystectomy noted. No evidence of biliary dilatation. Pancreas: No mass, inflammatory changes, or other significant abnormality.  Spleen: Within upper limits of normal in size. Normal appearance. No splenic lesions identified. Adrenals/Urinary Tract: No masses identified. No evidence of hydronephrosis. No evidence of renal swelling, perinephric fluid, or inflammatory changes. No evidence of ureteral calculi or dilatation. Unopacified urinary bladder is unremarkable in appearance. Stomach/Bowel: No evidence of obstruction, inflammatory process, or abnormal fluid collections. Vascular/Lymphatic: No pathologically enlarged lymph nodes. No evidence of abdominal aortic aneurysm. Reproductive: No mass or other significant abnormality. Bilateral tubal ligation clips noted. Other: None. Musculoskeletal:  No suspicious bone lesions identified. IMPRESSION: Negative. No radiographic evidence of pyelonephritis, renal abscess, hydronephrosis, or other acute findings. Electronically Signed   By: Earle Gell M.D.   On: 11/01/2015 21:15   The radiological images were independently reviewed by myself in the office and results were reviewed with the patient. My independent  interpretation of images:  The bone windows were reviewed and the above CT abdomen.  Patient's intra-articular hip spaces are preserved without significant anomaly.  The SI joints are roughly normal.  She does have some mild degenerative disc disease in the lower thoracic spine, but aside from this  Relatively unremarkable with no acute fracture and no significant facet arthropathy. Electronically Signed  By: Owens Loffler, MD On: 11/08/2015 5:53 PM   Results for orders placed or performed in visit on 11/07/15  Sedimentation rate  Result Value Ref Range   Sed Rate 35 (H) 0 - 22 mm/hr  High sensitivity CRP  Result Value Ref Range   CRP, High Sensitivity 3.030 0.000 - 5.000 mg/L  Rheumatoid factor  Result Value Ref Range   Rhuematoid fact SerPl-aCnc <10 <=14 IU/mL  Anti-DNA antibody, double-stranded  Result Value Ref Range   ds DNA Ab  IU/mL  ANA  Result Value Ref Range   Anit Nuclear Antibody(ANA) NEG NEGATIVE  Cyclic citrul peptide antibody, IgG  Result Value Ref Range   Cyclic Citrullin Peptide Ab  Units  Uric acid  Result Value Ref Range   Uric Acid, Serum 4.0 2.4 - 7.0 mg/dL     Assessment and Plan:   Polyarthralgia - Plan: Sedimentation rate, High sensitivity CRP, Rheumatoid factor, Anti-DNA antibody, double-stranded, ANA, Cyclic citrul peptide antibody, IgG, Uric acid  Psoriasis - Plan: Sedimentation rate, High sensitivity CRP, Rheumatoid factor, Anti-DNA antibody, double-stranded, ANA, Cyclic citrul peptide antibody, IgG, Uric acid  Family history of rheumatoid arthritis - Plan: Sedimentation rate, High sensitivity CRP, Rheumatoid factor, Anti-DNA antibody, double-stranded, ANA, Cyclic citrul peptide antibody, IgG, Uric acid  Malar rash - Plan: Sedimentation rate, High sensitivity CRP, Rheumatoid factor, Anti-DNA antibody, double-stranded, ANA, Cyclic citrul peptide antibody, IgG, Uric acid  Multiple risk factors for rheumatological disease including psoriasis and mother  with rheumatoid arthritis, additionally with malar rash and multiple joint complaints in a young person.  Begin rheumatological workup.  Follow-up: No Follow-up on file.  New Prescriptions   HYOSCYAMINE (LEVSIN SL) 0.125 MG SL TABLET    Place 1 tablet (0.125 mg total) under the tongue every 4 (four) hours as needed.   Modified Medications   Modified Medication Previous Medication   TRAMADOL (ULTRAM) 50 MG TABLET traMADol (ULTRAM) 50 MG tablet      Take 1 tablet (50 mg total) by mouth 3 (three) times daily as needed.    Take 1 tablet (50 mg total) by mouth 3 (three) times daily as needed.   Orders Placed This Encounter  Procedures  . Sedimentation rate  . High sensitivity CRP  . Rheumatoid factor  . Anti-DNA antibody, double-stranded  .  ANA  . Cyclic citrul peptide antibody, IgG  . Uric acid    Signed,  Mabry Tift T. Derrel Moore, MD   Patient's Medications  New Prescriptions   HYOSCYAMINE (LEVSIN SL) 0.125 MG SL TABLET    Place 1 tablet (0.125 mg total) under the tongue every 4 (four) hours as needed.  Previous Medications   LEVOCETIRIZINE (XYZAL) 5 MG TABLET    Take 1 tablet (5 mg total) by mouth every evening.   NIFEDIPINE (PROCARDIA-XL/ADALAT CC) 30 MG 24 HR TABLET    Take 30 mg by mouth daily. Reported on 11/07/2015  Modified Medications   Modified Medication Previous Medication   TRAMADOL (ULTRAM) 50 MG TABLET traMADol (ULTRAM) 50 MG tablet      Take 1 tablet (50 mg total) by mouth 3 (three) times daily as needed.    Take 1 tablet (50 mg total) by mouth 3 (three) times daily as needed.  Discontinued Medications   DICYCLOMINE (BENTYL) 20 MG TABLET    Take 20 mg by mouth every 6 (six) hours. Reported on 11/02/2015

## 2015-11-08 ENCOUNTER — Ambulatory Visit (INDEPENDENT_AMBULATORY_CARE_PROVIDER_SITE_OTHER): Payer: Medicaid Other | Admitting: Internal Medicine

## 2015-11-08 ENCOUNTER — Encounter: Payer: Self-pay | Admitting: Internal Medicine

## 2015-11-08 VITALS — BP 124/82 | HR 81 | Temp 98.5°F | Wt 190.5 lb

## 2015-11-08 DIAGNOSIS — R1084 Generalized abdominal pain: Secondary | ICD-10-CM

## 2015-11-08 DIAGNOSIS — R109 Unspecified abdominal pain: Secondary | ICD-10-CM | POA: Insufficient documentation

## 2015-11-08 DIAGNOSIS — I10 Essential (primary) hypertension: Secondary | ICD-10-CM

## 2015-11-08 LAB — ANA: ANA: NEGATIVE

## 2015-11-08 MED ORDER — HYOSCYAMINE SULFATE 0.125 MG SL SUBL: 0.1250 mg | SUBLINGUAL_TABLET | SUBLINGUAL | Status: DC | PRN

## 2015-11-08 NOTE — Progress Notes (Signed)
Subjective:    Patient ID: Bridget Maldonado, female    DOB: March 03, 1981, 35 y.o.   MRN: [id number]  HPI Here for hospital follow up  Thinks she is better Still somewhat tired Had severe abdominal pain and fever Was sure she had pyelonephritis again Tachycardia noted  Urine was negative No findings on CT  No answers---?spasm related to IBS  Just feels "exhausted"---but has a baby (21 months old)  Off BP meds Has been doing well with this since baby born  Current Outpatient Prescriptions on File Prior to Visit  Medication Sig Dispense Refill  . levocetirizine (XYZAL) 5 MG tablet Take 1 tablet (5 mg total) by mouth every evening. 30 tablet 3  . NIFEdipine (PROCARDIA-XL/ADALAT CC) 30 MG 24 hr tablet Take 30 mg by mouth daily. Reported on 11/07/2015    . traMADol (ULTRAM) 50 MG tablet Take 1 tablet (50 mg total) by mouth 3 (three) times daily as needed. 30 tablet 0   No current facility-administered medications on file prior to visit.    Allergies  Allergen Reactions  . Sulfa Antibiotics Nausea And Vomiting  . Ciprofloxacin Nausea And Vomiting  . Effexor [Venlafaxine Hcl] Other (See Comments)    Reaction:  Made pt sleep for two days straight.    Past Medical History  Diagnosis Date  . 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 Chronic headaches   . Psoriasis   . Obese   . 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 Hemorrhoids 08/2007  . IBS (irritable bowel syndrome) 08/2007  . Bipolar disorder ([place])   . Multiple allergies   . Asthma   . Migraines   . Kidney infection     Past Surgical History  Procedure Laterality Date  . Laparoscopic cholecystectomy  6/09    Dr. Hulen Skains  . Tympanostomy tube placement    . Ear tube removal    . Colonoscopy w/ biopsies    . Esophagogastroduodenoscopy    . Colonoscopy    . Cesarean section      Family History  Problem Relation Age of Onset  . Breast cancer Mother   . Rheum arthritis Mother   . Asthma Father   . Anxiety disorder  Father     Severe, possible bipolar  . Colon polyps Maternal Grandfather   . Irritable bowel syndrome Mother   . Irritable bowel syndrome Maternal Grandmother   . Diabetes      mat. great uncles  . Alzheimer's disease      Dad's side  . Parkinson's disease Maternal Grandfather   . Colon cancer      mat GGF    Social History   Social History  . Marital Status: Single    Spouse Name: N/A  . Number of Children: 1  . Years of Education: N/A   Occupational History  . unemployed    Social History Main Topics  . Smoking status: Never Smoker   . Smokeless tobacco: Never Used  . Alcohol Use: No  . Drug Use: No  . Sexual Activity: Yes   Other Topics Concern  . Not on file   Social History Narrative   Lives with parents.   Review of Systems No fever Eating okay Bowels are fine No nausea or vomiting No SOB Slight cough now No dysuria, hematuria or urgency    Objective:   Physical Exam  Constitutional: She appears well-developed and well-nourished. No distress.  Neck: Normal range of motion. Neck supple.  Pulmonary/Chest:  Effort normal and breath sounds normal. No respiratory distress. She has no wheezes. She has no rales.  Abdominal: Soft. Bowel sounds are normal. She exhibits no distension. There is no tenderness. There is no rebound and no guarding.  Musculoskeletal: She exhibits no edema.  Lymphadenopathy:    She has no cervical adenopathy.  Psychiatric: She has a normal mood and affect. Her behavior is normal.          Assessment & Plan:

## 2015-11-08 NOTE — Assessment & Plan Note (Addendum)
No etiology but better now Reviewed all hospital reports She notes [facility] recall of frozen waffles that they had-- but hadn't eaten them for a week before Currently asymptomatic No action at this point Will give levsin for prn

## 2015-11-08 NOTE — Progress Notes (Signed)
Pre visit review using our clinic review tool, if applicable. No additional management support is needed unless otherwise documented below in the visit note. 

## 2015-11-08 NOTE — Assessment & Plan Note (Signed)
BP Readings from Last 3 Encounters:  11/08/15 124/82  11/07/15 138/98  11/03/15 127/77   Better now off treatment May have been more just pregnancy related No meds for now

## 2015-11-09 LAB — ANTI-DNA ANTIBODY, DOUBLE-STRANDED: ds DNA Ab: 2 IU/mL

## 2015-11-09 LAB — CYCLIC CITRUL PEPTIDE ANTIBODY, IGG

## 2015-11-12 ENCOUNTER — Telehealth: Payer: Self-pay | Admitting: Internal Medicine

## 2015-11-12 ENCOUNTER — Other Ambulatory Visit: Payer: Self-pay

## 2015-11-12 DIAGNOSIS — Z1231 Encounter for screening mammogram for malignant neoplasm of breast: Secondary | ICD-10-CM

## 2015-11-12 NOTE — Telephone Encounter (Signed)
Pt requests a call to discuss 5/17 test results.

## 2015-11-12 NOTE — Telephone Encounter (Signed)
I spoke to the patient. She saw Dr Lorelei Pont last week and was wanting to know if her labs had been resulted. I advised her we had them but they have not been resulted by Dr Lorelei Pont

## 2015-11-13 ENCOUNTER — Encounter: Payer: Self-pay | Admitting: Family Medicine

## 2015-11-13 ENCOUNTER — Other Ambulatory Visit: Payer: Self-pay | Admitting: Family Medicine

## 2015-11-13 DIAGNOSIS — M2560 Stiffness of unspecified joint, not elsewhere classified: Secondary | ICD-10-CM

## 2015-11-13 DIAGNOSIS — M2550 Pain in unspecified joint: Secondary | ICD-10-CM

## 2015-11-13 DIAGNOSIS — Z8261 Family history of arthritis: Secondary | ICD-10-CM

## 2015-11-13 DIAGNOSIS — L409 Psoriasis, unspecified: Secondary | ICD-10-CM

## 2015-11-14 ENCOUNTER — Ambulatory Visit
Admission: RE | Admit: 2015-11-14 | Discharge: 2015-11-14 | Disposition: A | Discharge status: Home / Self Care | Payer: Medicaid Other | Source: Ambulatory Visit

## 2015-11-14 DIAGNOSIS — Z1231 Encounter for screening mammogram for malignant neoplasm of breast: Secondary | ICD-10-CM

## 2015-11-27 ENCOUNTER — Ambulatory Visit (INDEPENDENT_AMBULATORY_CARE_PROVIDER_SITE_OTHER): Payer: Medicaid Other | Admitting: Internal Medicine

## 2015-11-27 ENCOUNTER — Encounter: Payer: Self-pay | Admitting: Internal Medicine

## 2015-11-27 VITALS — BP 112/70 | HR 91 | Temp 98.1°F | Wt 187.0 lb

## 2015-11-27 DIAGNOSIS — J0101 Acute recurrent maxillary sinusitis: Secondary | ICD-10-CM | POA: Insufficient documentation

## 2015-11-27 MED ORDER — AMOXICILLIN-POT CLAVULANATE 875-125 MG PO TABS: 1.0000 | ORAL_TABLET | Freq: Two times a day (BID) | ORAL | Status: DC

## 2015-11-27 NOTE — Progress Notes (Signed)
Pre visit review using our clinic review tool, if applicable. No additional management support is needed unless otherwise documented below in the visit note. 

## 2015-11-27 NOTE — Progress Notes (Signed)
Subjective:    Patient ID: Bridget Maldonado, female    DOB: [birth year], 35 y.o.   MRN: [id number]  HPI Here due to sinus symptoms  Has sinus pressure and facial pain Also having ear pain Goes back intermittently for 3 months  Tried OTC sinus meds and Neti pot Draining yellow, bloody drainage Jaw and teeth aching Cough now mostly at night Occasional SOB--only after bad coughing spell Fever last night---low grade. Tried ibuprofen and it helped pain and fever Got chilled last night--- no sweats  z-pak never relieves these symptoms--had in March and April  Current Outpatient Prescriptions on File Prior to Visit  Medication Sig Dispense Refill  . hyoscyamine (LEVSIN SL) 0.125 MG SL tablet Place 1 tablet (0.125 mg total) under the tongue every 4 (four) hours as needed. 30 tablet 0  . levocetirizine (XYZAL) 5 MG tablet Take 1 tablet (5 mg total) by mouth every evening. 30 tablet 3  . NIFEdipine (PROCARDIA-XL/ADALAT CC) 30 MG 24 hr tablet Take 30 mg by mouth daily. Reported on 11/07/2015    . traMADol (ULTRAM) 50 MG tablet Take 1 tablet (50 mg total) by mouth 3 (three) times daily as needed. 30 tablet 0   No current facility-administered medications on file prior to visit.    Allergies  Allergen Reactions  . Sulfa Antibiotics Nausea And Vomiting  . Ciprofloxacin Nausea And Vomiting  . Effexor [Venlafaxine Hcl] Other (See Comments)    Reaction:  Made pt sleep for two days straight.    Past Medical History  Diagnosis Date  . 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 Chronic headaches   . Psoriasis   . Obese   . 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 Hemorrhoids 08/2007  . IBS (irritable bowel syndrome) 08/2007  . Bipolar disorder ([place])   . Multiple allergies   . Asthma   . Migraines   . Kidney infection     Past Surgical History  Procedure Laterality Date  . Laparoscopic cholecystectomy  6/09    Dr. Hulen Skains  . Tympanostomy tube placement    . Ear tube removal    .  Colonoscopy w/ biopsies    . Esophagogastroduodenoscopy    . Colonoscopy    . Cesarean section      Family History  Problem Relation Age of Onset  . Breast cancer Mother   . Rheum arthritis Mother   . Asthma Father   . Anxiety disorder Father     Severe, possible bipolar  . Colon polyps Maternal Grandfather   . Irritable bowel syndrome Mother   . Irritable bowel syndrome Maternal Grandmother   . Diabetes      mat. great uncles  . Alzheimer's disease      Dad's side  . Parkinson's disease Maternal Grandfather   . Colon cancer      mat GGF    Social History   Social History  . Marital Status: Single    Spouse Name: N/A  . Number of Children: 1  . Years of Education: N/A   Occupational History  . unemployed    Social History Main Topics  . Smoking status: Never Smoker   . Smokeless tobacco: Never Used  . Alcohol Use: No  . Drug Use: No  . Sexual Activity: Yes   Other Topics Concern  . Not on file   Social History Narrative   Lives with parents.   Review of Systems  No rash No vomiting or diarrhea Appetite  is okay     Objective:   Physical Exam  Constitutional: She appears well-developed. No distress.  HENT:  Mouth/Throat: Oropharynx is clear and moist. No oropharyngeal exudate.  Mild maxillary tenderness Scarring both TMs---has fluid behind right TM but not really inflamed Moderate nasal inflammation  Neck: Normal range of motion. Neck supple.  Pulmonary/Chest: Effort normal and breath sounds normal. No respiratory distress. She has no wheezes. She has no rales.  Lymphadenopathy:    She has no cervical adenopathy.          Assessment & Plan:

## 2015-11-27 NOTE — Assessment & Plan Note (Signed)
z-pak not effective Has serous OM as well Discussed supportive care Will try augmentin

## 2015-11-30 ENCOUNTER — Other Ambulatory Visit: Payer: Self-pay | Admitting: Internal Medicine

## 2015-11-30 ENCOUNTER — Other Ambulatory Visit: Payer: Self-pay | Admitting: Family Medicine

## 2015-11-30 MED ORDER — TRAMADOL HCL 50 MG PO TABS: 50.0000 mg | ORAL_TABLET | Freq: Three times a day (TID) | ORAL | Status: DC | PRN

## 2015-11-30 NOTE — Telephone Encounter (Signed)
Last office visit 11/27/15 with Dr. Silvio Pate. Last refilled 11/07/2015 for #30 with no refills.  Ok to refill?

## 2015-11-30 NOTE — Telephone Encounter (Signed)
Left refill on voice mail at pharmacy  

## 2015-11-30 NOTE — Telephone Encounter (Signed)
Approved: 30 x 0 

## 2015-12-24 ENCOUNTER — Other Ambulatory Visit: Payer: Self-pay | Admitting: Internal Medicine

## 2015-12-24 MED ORDER — TRAMADOL HCL 50 MG PO TABS: 50.0000 mg | ORAL_TABLET | Freq: Three times a day (TID) | ORAL | Status: DC | PRN

## 2015-12-24 NOTE — Telephone Encounter (Signed)
Approved: 30 x 0 

## 2015-12-24 NOTE — Telephone Encounter (Signed)
Last filled 11-26-15 #60 Last OV 11-27-15 No future OV

## 2015-12-24 NOTE — Telephone Encounter (Signed)
Left refill on voice mail at pharmacy  

## 2016-01-16 ENCOUNTER — Encounter: Payer: Self-pay | Admitting: Family Medicine

## 2016-01-16 MED ORDER — TRAMADOL HCL 50 MG PO TABS: 50.0000 mg | ORAL_TABLET | Freq: Three times a day (TID) | ORAL | 0 refills | Status: DC | PRN

## 2016-01-16 NOTE — Telephone Encounter (Signed)
Ok to refill #30, 0 refills

## 2016-01-16 NOTE — Telephone Encounter (Signed)
Tramadol called into [facility] [address] 

## 2016-01-16 NOTE — Telephone Encounter (Signed)
Last office visit 11/27/2015 with Dr. Silvio Pate.  Last refilled 12/24/2015 for #30 with no refills.  Ok to refill?

## 2016-02-06 ENCOUNTER — Other Ambulatory Visit: Payer: Self-pay | Admitting: Family Medicine

## 2016-02-06 MED ORDER — TRAMADOL HCL 50 MG PO TABS: 50.0000 mg | ORAL_TABLET | Freq: Three times a day (TID) | ORAL | 0 refills | Status: DC | PRN

## 2016-02-06 NOTE — Telephone Encounter (Signed)
Approved: 30 x 0 

## 2016-02-06 NOTE — Telephone Encounter (Signed)
Last filled 01/16/2016--please advise if okay to refill

## 2016-02-27 ENCOUNTER — Other Ambulatory Visit: Payer: Self-pay | Admitting: Internal Medicine

## 2016-02-27 MED ORDER — TRAMADOL HCL 50 MG PO TABS: 50.0000 mg | ORAL_TABLET | Freq: Three times a day (TID) | ORAL | 0 refills | Status: DC | PRN

## 2016-02-27 NOTE — Telephone Encounter (Signed)
Approved:   #30 x 0 Let her know we will need an office visit to discuss this if she can't make it last a month

## 2016-02-27 NOTE — Telephone Encounter (Signed)
Left message on vm for pt advising OV needed if med does not last 1 month.  Left refill on voice mail at pharmacy

## 2016-02-27 NOTE — Telephone Encounter (Signed)
Last filled #30 02-06-16 Last OV Acute 11-27-15

## 2016-03-24 ENCOUNTER — Other Ambulatory Visit: Payer: Self-pay | Admitting: Internal Medicine

## 2016-03-24 MED ORDER — TRAMADOL HCL 50 MG PO TABS: 50.0000 mg | ORAL_TABLET | Freq: Three times a day (TID) | ORAL | 0 refills | Status: DC | PRN

## 2016-03-24 NOTE — Telephone Encounter (Signed)
Approved: okay to fill #30 x 0 It is for tid prn so not really too early---but if she is needing more, she should set up appt (may be best to see Dr Lorelei Pont)

## 2016-03-24 NOTE — Telephone Encounter (Signed)
Left refill on voice mail at pharmacy  

## 2016-04-03 ENCOUNTER — Emergency Department
Admission: EM | Admit: 2016-04-03 | Discharge: 2016-04-03 | Disposition: A | Discharge status: Home / Self Care | Payer: Medicaid Other | Attending: Emergency Medicine | Admitting: Emergency Medicine

## 2016-04-03 ENCOUNTER — Encounter: Payer: Self-pay | Admitting: Emergency Medicine

## 2016-04-03 ENCOUNTER — Emergency Department: Payer: Medicaid Other

## 2016-04-03 DIAGNOSIS — M546 Pain in thoracic spine: Secondary | ICD-10-CM | POA: Diagnosis not present

## 2016-04-03 DIAGNOSIS — R079 Chest pain, unspecified: Secondary | ICD-10-CM | POA: Diagnosis present

## 2016-04-03 DIAGNOSIS — Z79899 Other long term (current) drug therapy: Secondary | ICD-10-CM | POA: Diagnosis not present

## 2016-04-03 DIAGNOSIS — I10 Essential (primary) hypertension: Secondary | ICD-10-CM | POA: Insufficient documentation

## 2016-04-03 DIAGNOSIS — J45909 Unspecified asthma, uncomplicated: Secondary | ICD-10-CM | POA: Insufficient documentation

## 2016-04-03 DIAGNOSIS — R0789 Other chest pain: Secondary | ICD-10-CM | POA: Diagnosis not present

## 2016-04-03 LAB — CBC
HCT: 40.5 % (ref 35.0–47.0)
Hemoglobin: 14.2 g/dL (ref 12.0–16.0)
MCH: 29.4 pg (ref 26.0–34.0)
MCHC: 35.1 g/dL (ref 32.0–36.0)
MCV: 83.8 fL (ref 80.0–100.0)
PLATELETS: 238 10*3/uL (ref 150–440)
RBC: 4.83 MIL/uL (ref 3.80–5.20)
RDW: 13.4 % (ref 11.5–14.5)
WBC: 6.9 10*3/uL (ref 3.6–11.0)

## 2016-04-03 LAB — BASIC METABOLIC PANEL
Anion gap: 4 — ABNORMAL LOW (ref 5–15)
BUN: 11 mg/dL (ref 6–20)
CALCIUM: 9.4 mg/dL (ref 8.9–10.3)
CO2: 29 mmol/L (ref 22–32)
CREATININE: 0.79 mg/dL (ref 0.44–1.00)
Chloride: 104 mmol/L (ref 101–111)
GFR calc non Af Amer: >60 mL/min (ref >60)
GLUCOSE: 109 mg/dL — AB (ref 65–99)
Potassium: 3.8 mmol/L (ref 3.5–5.1)
Sodium: 137 mmol/L (ref 135–145)

## 2016-04-03 LAB — TROPONIN I

## 2016-04-03 MED ORDER — CARISOPRODOL 350 MG PO TABS
350.0000 mg | ORAL_TABLET | Freq: Three times a day (TID) | ORAL | 0 refills | Status: DC | PRN
Start: 2016-04-03 — End: 2016-05-27

## 2016-04-03 MED ORDER — TRAMADOL HCL 50 MG PO TABS: 50.0000 mg | ORAL_TABLET | Freq: Four times a day (QID) | ORAL | 0 refills | Status: DC | PRN

## 2016-04-03 MED ORDER — CARISOPRODOL 350 MG PO TABS: 350.0000 mg | ORAL_TABLET | Freq: Three times a day (TID) | ORAL | 1 refills | Status: DC | PRN

## 2016-04-03 NOTE — ED Triage Notes (Signed)
Says chest paibn and her bp was up. Says she does do heavy lifing at work and thought it was that, but would like to be checked due to bp up.

## 2016-04-03 NOTE — ED Provider Notes (Addendum)
[facility] Emergency Department Provider Note  ____________________________________________   First MD Initiated Contact with Patient 04/03/16 1404     (approximate)  I have reviewed the triage vital signs and the nursing notes.   HISTORY  Chief Complaint Chest Pain   HPI Bridget Maldonado is a 35 y.o. female with a history of anxiety, bipolar disorder as well as help syndrome and pregnancy who is presenting to the emergency department today with left thoracic back pain as well as central chest pain that started yesterday. 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 She says that the pain is an 8 out of 10 at her baseline but worsens when she goes from a lying to a sitting position. She denies any shortness of breath, nausea vomiting or diarrhea. Denies any diaphoresis. Says that during pregnancy about one and a half years ago that she had severe help syndrome which required a cesarean section. She says that her blood pressure stabilized after that and she is no longer on any blood pressure medications. She says that her normal blood pressures about 120/75. She says that she recently went into the [place] [facility] earlier today to be evaluated and ended up in the emergency department secondary to her blood pressure with hypertension. She says that she does have an increase in her blood pressure which she knows is related 20 as pain as well. She says that she has been taking over-the-counter NSAIDs at home without relief.She says that the pain feels like a squeezing pain in her back when she goes from a lying to a sitting position. She says that she also had similar pain in the left side of her neck about 2 weeks ago that worsened with movement of her neck. This pain has resolved at this time.   Past Medical History:  Diagnosis Date  . Anxiety    Possible bipolar, Type II  . Asthma   .  Bipolar disorder ([place])   . Chronic headaches   . Hemorrhoids 08/2007  . IBS (irritable bowel syndrome) 08/2007  . 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 Kidney infection   . Migraines   . Multiple allergies   . Obese   . Psoriasis     Patient Active Problem List   Diagnosis Date Noted  . Acute recurrent maxillary sinusitis 11/27/2015  . Abdominal pain 11/08/2015  . Chronic cough 10/15/2015  . Abnormal ECG 10/18/2014  . Essential (primary) hypertension 10/18/2014  . Acute asthma flare 05/25/2014  . Arthritis 11/24/2013  . Arthropathia 11/24/2013  . Obstipation 04/12/2013  . Migraine with status migrainosus 09/08/2012  . 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 Mild intermittent asthma 05/26/2011  . Severe anxiety with panic 08/29/2006  . Panic disorder without agoraphobia 08/29/2006    Past Surgical History:  Procedure Laterality Date  . CESAREAN SECTION    . COLONOSCOPY    . COLONOSCOPY W/ BIOPSIES    . EAR TUBE REMOVAL    . ESOPHAGOGASTRODUODENOSCOPY    . LAPAROSCOPIC CHOLECYSTECTOMY  6/09   Dr. Hulen Skains  . TYMPANOSTOMY TUBE PLACEMENT      Prior to Admission medications   Medication Sig Start Date End Date Taking? Authorizing Provider  amoxicillin-clavulanate (AUGMENTIN) 875-125 MG tablet Take 1 tablet by mouth 2 (two) times daily. 11/27/15   Venia Carbon, MD  hyoscyamine (LEVSIN SL) 0.125 MG SL tablet Place 1 tablet (  0.125 mg total) under the tongue every 4 (four) hours as needed. 11/08/15   Venia Carbon, MD  levocetirizine (XYZAL) 5 MG tablet Take 1 tablet (5 mg total) by mouth every evening. 09/27/15   Ria Bush, MD  NIFEdipine (PROCARDIA-XL/ADALAT CC) 30 MG 24 hr tablet Take 30 mg by mouth daily. Reported on 11/07/2015    Historical Provider, MD  traMADol (ULTRAM) 50 MG tablet Take 1 tablet (50 mg total) by mouth 3 (three) times daily as needed. 03/24/16   Venia Carbon, MD    Allergies Sulfa antibiotics; Ciprofloxacin; and Effexor  [venlafaxine hcl]  Family History  Problem Relation Age of Onset  . Colon polyps Maternal Grandfather   . Parkinson's disease Maternal Grandfather   . Breast cancer Mother   . Rheum arthritis Mother   . Irritable bowel syndrome Mother   . Asthma Father   . Anxiety disorder Father     Severe, possible bipolar  . Irritable bowel syndrome Maternal Grandmother   . Diabetes      mat. great uncles  . Alzheimer's disease      Dad's side  . Colon cancer      mat GGF    Social History Social History  Substance Use Topics  . Smoking status: Never Smoker  . Smokeless tobacco: Never Used  . Alcohol use No    Review of Systems Constitutional: No fever/chills Eyes: No visual changes. ENT: No sore throat. Cardiovascularas above Respiratory: Denies shortness of breath. Gastrointestinal: No abdominal pain.  No nausea, no vomiting.  No diarrhea.  No constipation. Genitourinary: Negative for dysuria. Musculoskeletal: as above  Skin: Negative for rash. Neurological: Negative for headaches, focal weakness or numbness.  10-point ROS otherwise negative.  ____________________________________________   PHYSICAL EXAM:  VITAL SIGNS: ED Triage Vitals  Enc Vitals Group     BP 04/03/16 1125 (!) 140/106     Pulse Rate 04/03/16 1125 (!) 102     Resp 04/03/16 1125 16     Temp 04/03/16 1125 98.6 F (37 C)     Temp Source 04/03/16 1125 Oral     SpO2 04/03/16 1125 99 %     Weight 04/03/16 1126 177 lb (80.3 kg)     Height 04/03/16 1126 5\' 5"  (1.651 m)     Head Circumference --      Peak Flow --      Pain Score 04/03/16 1126 8     Pain Loc --      Pain Edu? --      Excl. in [place]? --     Constitutional: Alert and oriented. Well appearing and in no acute distress. Eyes: Conjunctivae are normal. PERRL. EOMI. Head: Atraumatic. Nose: No congestion/rhinnorhea. Mouth/Throat: Mucous membranes are moist.   Neck: No stridor.   Cardiovascular: Normal rate, regular rhythm. Grossly normal heart  sounds.  Mild tenderness to palpation over the left sternal border. Equal, intact in bilateral radial as well as dorsalis pedis pulses. Respiratory: Normal respiratory effort.  No retractions. Lungs CTAB. Gastrointestinal: Soft and nontender. No distention. No abdominal bruits. No CVA tenderness. Musculoskeletal: No lower extremity tenderness nor edema.  No joint effusions. Tenderness palpation along the distribution of the left rhomboid muscle groups. Neurologic:  Normal speech and language. No gross focal neurologic deficits are appreciated. No gait instability. Skin:  Skin is warm, dry and intact. No rash noted. Psychiatric: Mood and affect are normal. Speech and behavior are normal.  ____________________________________________   LABS (all labs ordered are listed, but  only abnormal results are displayed)  Labs Reviewed  BASIC METABOLIC PANEL - Abnormal; Notable for the following:       Result Value   Glucose, Bld 109 (*)    Anion gap 4 (*)    All other components within normal limits  CBC  TROPONIN I   ____________________________________________  EKG  ED ECG REPORT I, Mahmoud Blazejewski,  Youlanda Roys, the attending physician, personally viewed and interpreted this ECG.   Date: 04/03/2016  EKG Time: 1120  Rate: 98  Rhythm: normal sinus rhythm  Axis: Normal axis  Intervals:none  ST&T Change: No ST segment elevation or depression. No abnormal T-wave inversion.  ____________________________________________  RADIOLOGY  DG Chest 2 View (Accession [id number]) (Order [id number])  Imaging  Date: 04/03/2016 Department: [place] Released By: Caesar Chestnut, RN (auto-released) Authorizing: Orbie Pyo, MD  Exam Information   Status Exam Begun  Exam Ended   Final Blood pressure retaken in the room  04/03/2016 1:28 PM 04/03/2016 1:39 PM  PACS Images   Show images for DG Chest 2 View  Study Result   CLINICAL DATA:  Chest pain.   High blood pressure.  EXAM: CHEST  2 VIEW  COMPARISON:  03/09/2014  FINDINGS: The heart size and mediastinal contours are within normal limits. Both lungs are clear. The visualized skeletal structures are unremarkable.  IMPRESSION: No active cardiopulmonary disease.   Electronically Signed   By: Markus Daft M.D.   On: 04/03/2016 13:45     ____________________________________________   PROCEDURES  Procedure(s) performed:   Procedures  Critical Care performed:   ____________________________________________   INITIAL IMPRESSION / ASSESSMENT AND PLAN / ED COURSE  Pertinent labs & imaging results that were available during my care of the patient were reviewed by me and considered in my medical decision making (see chart for details).  Blood pressure retaken in the room and is 162/101. Labs as well as imaging and EKG are very reassuring. History and physical much more consistent with musculoskeletal chest pain. Patient says that she has a blood pressure monitor at home. I'll be discharging her with tramadol, which she requested. Originally, just wanted to discharge her with Soma, the muscle relaxer. Along with a salve such as Aspercreme, BenGay or icy hot I felt that the Manuela Neptune would do her well. However, she requested the tramadol. She says it does not make her drowsy. I'll be discharging her with the tramadol as well as the Soma. She knows to take these at least 8 hours apart from one another that the Manuela Neptune makes make her drowsy. She says that the tramadol does not make her drowsy and his help with her pain in the past. Understanding this plan and willing to comply. She will continue to take her blood pressure at home to ensure resolution of her blood pressure elevation.  Clinical Course     ____________________________________________   FINAL CLINICAL IMPRESSION(S) / ED DIAGNOSES  Chest pain. Back pain.    NEW MEDICATIONS STARTED DURING THIS VISIT:  New  Prescriptions   No medications on file     Note:  This document was prepared using Dragon voice recognition software and may include unintentional dictation errors.    Orbie Pyo, MD 04/03/16 1510  PERC negative.     Orbie Pyo, MD 04/03/16 1512  Controlled substance record checked and patient has recurring tramadol prescription. We'll only give prescription for Soma at this time. Extremities to the patient and she is understanding and willing  to comply.   Orbie Pyo, MD 04/03/16 1516  Tramadol prescription was shredded.   Orbie Pyo, MD 04/03/16 [id number]

## 2016-04-09 ENCOUNTER — Telehealth: Payer: Self-pay

## 2016-04-09 NOTE — Telephone Encounter (Signed)
Dr Silvio Pate had received ER Notes from 04-03-16 for Chest Pain. Left message to see how pt was doing

## 2016-04-16 ENCOUNTER — Encounter: Payer: Self-pay | Admitting: Internal Medicine

## 2016-04-16 ENCOUNTER — Ambulatory Visit (INDEPENDENT_AMBULATORY_CARE_PROVIDER_SITE_OTHER): Payer: Medicaid Other | Admitting: Internal Medicine

## 2016-04-16 VITALS — BP 132/90 | HR 110 | Temp 98.0°F | Resp 12 | Wt 185.0 lb

## 2016-04-16 DIAGNOSIS — J4521 Mild intermittent asthma with (acute) exacerbation: Secondary | ICD-10-CM

## 2016-04-16 DIAGNOSIS — I10 Essential (primary) hypertension: Secondary | ICD-10-CM | POA: Diagnosis not present

## 2016-04-16 MED ORDER — PREDNISONE 20 MG PO TABS: 40.0000 mg | ORAL_TABLET | Freq: Every day | ORAL | 0 refills | Status: DC

## 2016-04-16 MED ORDER — ALBUTEROL SULFATE HFA 108 (90 BASE) MCG/ACT IN AERS: 2.0000 | INHALATION_SPRAY | Freq: Four times a day (QID) | RESPIRATORY_TRACT | 1 refills | Status: DC | PRN

## 2016-04-16 MED ORDER — HYDROCODONE-HOMATROPINE 5-1.5 MG/5ML PO SYRP: 5.0000 mL | ORAL_SOLUTION | Freq: Every evening | ORAL | 0 refills | Status: DC | PRN

## 2016-04-16 MED ORDER — NIFEDIPINE ER 30 MG PO TB24: 30.0000 mg | ORAL_TABLET | Freq: Every day | ORAL | 2 refills | Status: DC

## 2016-04-16 NOTE — Assessment & Plan Note (Addendum)
BP Readings from Last 3 Encounters:  04/16/16 132/90  04/03/16 (!) 143/104  11/27/15 112/70   Will restart low dose nifedipine Discussed holding if she starts getting dizziness She will monitor at home

## 2016-04-16 NOTE — Progress Notes (Signed)
Subjective:    Patient ID: Bridget Maldonado, female    DOB: [birth year], 35 y.o.   MRN: [id number]  HPI Here due to respiratory illness  Started with illness 3 days ago Felt like allergies at first Cough and wheezing especially bad in the evening Thick purulent sputum at times-- if not moving around a lot (not really at work)  No fever No chills or sweats Not bad SOB Some nasal drainage--- but can feel post nasal drip Bad sore throat at first Slight ear pain  Using her usual 2 zyrtec a day Cold/cough med OTC slight help  advair and albuterol in the past Out of both  Current Outpatient Prescriptions on File Prior to Visit  Medication Sig Dispense Refill  . carisoprodol (SOMA) 350 MG tablet Take 1 tablet (350 mg total) by mouth 3 (three) times daily as needed for muscle spasms. 15 tablet 0  . hyoscyamine (LEVSIN SL) 0.125 MG SL tablet Place 1 tablet (0.125 mg total) under the tongue every 4 (four) hours as needed. 30 tablet 0  . traMADol (ULTRAM) 50 MG tablet Take 1 tablet (50 mg total) by mouth every 6 (six) hours as needed for moderate pain or severe pain. 12 tablet 0   No current facility-administered medications on file prior to visit.     Allergies  Allergen Reactions  . Sulfa Antibiotics Nausea And Vomiting  . Ciprofloxacin Nausea And Vomiting  . Effexor [Venlafaxine Hcl] Other (See Comments)    Reaction:  Made pt sleep for two days straight.    Past Medical History:  Diagnosis Date  . Anxiety    Possible bipolar, Type II  . Asthma   . Bipolar disorder ([place])   . Chronic headaches   . Hemorrhoids 08/2007  . IBS (irritable bowel syndrome) 08/2007  . 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 Kidney infection   . Migraines   . Multiple allergies   . Obese   . Psoriasis     Past Surgical History:  Procedure Laterality Date  . CESAREAN SECTION    . COLONOSCOPY    . COLONOSCOPY W/ BIOPSIES    . EAR TUBE REMOVAL    . ESOPHAGOGASTRODUODENOSCOPY    . LAPAROSCOPIC  CHOLECYSTECTOMY  6/09   Dr. Hulen Skains  . TYMPANOSTOMY TUBE PLACEMENT      Family History  Problem Relation Age of Onset  . Colon polyps Maternal Grandfather   . Parkinson's disease Maternal Grandfather   . Breast cancer Mother   . Rheum arthritis Mother   . Irritable bowel syndrome Mother   . Asthma Father   . Anxiety disorder Father     Severe, possible bipolar  . Irritable bowel syndrome Maternal Grandmother   . Diabetes      mat. great uncles  . Alzheimer's disease      Dad's side  . Colon cancer      mat GGF     Review of Systems BP has been high--but with headache (153/112 this AM) Seen at urgent care 10/12 for back pain--then to ER due to elevated BP Not sleeping well Has been redoing rooms at house--pulling up carpet, etc (grandparent's old house). For a few months though    Objective:   Physical Exam  Constitutional: She appears well-developed. No distress.  HENT:  No sinus tenderness Scarring both TMs--?slight fluid on right. No inflammation Slight pharyngeal injection Moderate nasal inflammation  Neck: Normal range of motion. No thyromegaly present.  Pulmonary/Chest: Effort normal. No respiratory distress. She has  no rales.  Mild expiratory phase prolongation and wheezing Fair air movement  Lymphadenopathy:    She has no cervical adenopathy.          Assessment & Plan:

## 2016-04-16 NOTE — Patient Instructions (Signed)
Call if your infection gets worse---like worsening sputum production or ear pain.

## 2016-04-16 NOTE — Progress Notes (Signed)
Pre visit review using our clinic review tool, if applicable. No additional management support is needed unless otherwise documented below in the visit note. 

## 2016-04-16 NOTE — Assessment & Plan Note (Signed)
Mild exacerbation Will give prednisone and albuterol Antibiotic if worsens in ear or sinuses

## 2016-04-21 ENCOUNTER — Encounter: Payer: Self-pay | Admitting: Family Medicine

## 2016-04-21 MED ORDER — TRAMADOL HCL 50 MG PO TABS: 50.0000 mg | ORAL_TABLET | Freq: Four times a day (QID) | ORAL | 0 refills | Status: DC | PRN

## 2016-04-21 NOTE — Telephone Encounter (Signed)
This is reasonable. I am fine giving her #30, 0 refills.

## 2016-04-21 NOTE — Telephone Encounter (Signed)
Tramadol called into [facility] [address] 

## 2016-04-21 NOTE — Telephone Encounter (Signed)
Last office visit 04/16/2016 with Dr. Silvio Pate..  Last refilled 04/03/2016 for #12 with no refills by Dr. Clearnce Hasten.  Refill?

## 2016-05-12 ENCOUNTER — Ambulatory Visit (INDEPENDENT_AMBULATORY_CARE_PROVIDER_SITE_OTHER): Payer: Medicaid Other | Admitting: Internal Medicine

## 2016-05-12 ENCOUNTER — Encounter: Payer: Self-pay | Admitting: Internal Medicine

## 2016-05-12 VITALS — BP 130/82 | HR 120 | Temp 98.9°F | Resp 18 | Wt 182.0 lb

## 2016-05-12 DIAGNOSIS — J4531 Mild persistent asthma with (acute) exacerbation: Secondary | ICD-10-CM

## 2016-05-12 MED ORDER — FLUTICASONE-SALMETEROL 250-50 MCG/DOSE IN AEPB: 1.0000 | INHALATION_SPRAY | Freq: Two times a day (BID) | RESPIRATORY_TRACT | 5 refills | Status: DC

## 2016-05-12 MED ORDER — PREDNISONE 20 MG PO TABS: 40.0000 mg | ORAL_TABLET | Freq: Every day | ORAL | 0 refills | Status: DC

## 2016-05-12 MED ORDER — IPRATROPIUM BROMIDE 0.02 % IN SOLN
0.5000 mg | Freq: Once | RESPIRATORY_TRACT | Status: AC
  Administered 2016-05-12: 0.5 mg via RESPIRATORY_TRACT

## 2016-05-12 MED ORDER — HYDROCODONE-HOMATROPINE 5-1.5 MG/5ML PO SYRP: 5.0000 mL | ORAL_SOLUTION | Freq: Every evening | ORAL | 0 refills | Status: DC | PRN

## 2016-05-12 MED ORDER — METHYLPREDNISOLONE ACETATE 80 MG/ML IJ SUSP
80.0000 mg | Freq: Once | INTRAMUSCULAR | Status: AC
Start: 2016-05-12 — End: 2016-05-12
  Administered 2016-05-12: 80 mg via INTRAMUSCULAR

## 2016-05-12 MED ORDER — ALBUTEROL SULFATE (2.5 MG/3ML) 0.083% IN NEBU
2.5000 mg | INHALATION_SOLUTION | Freq: Once | RESPIRATORY_TRACT | Status: AC
Start: 2016-05-12 — End: 2016-05-12
  Administered 2016-05-12: 2.5 mg via RESPIRATORY_TRACT

## 2016-05-12 NOTE — Assessment & Plan Note (Addendum)
Seems to have active asthma again ?from new home exposure (old tobacco, ?mold) Will restart advair Depomedrol IM and prednisone Doesn't seem to be infection Pulmonary consult Refill cough med--needed more than just at bedtime Will give douneb nebulizer today also for symptom relief

## 2016-05-12 NOTE — Progress Notes (Signed)
Subjective:    Patient ID: Bridget Maldonado, female    DOB: 02/12/1981, 35 y.o.   MRN: [id number]  HPI Here due to ongoing cough  Reviewed the [facility] clinic note--was given antibiotic  She thinks she is responding to exposure to cigarette smoke from home she has moved to Feels exhausted Fever this morning--101  Taking mucinex and coricidin Inhalers also May get some brief improvement  May have had brief help with antibiotics Prednisone may have helped briefly  Not sleeping well Actually tried hot steam in bathroom to see if that would help wheezing Coughing up brown, nasty sputum (tastes gross)--mostly in AM  Current Outpatient Prescriptions on File Prior to Visit  Medication Sig Dispense Refill  . albuterol (PROVENTIL HFA;VENTOLIN HFA) 108 (90 Base) MCG/ACT inhaler Inhale 2 puffs into the lungs every 6 (six) hours as needed for wheezing or shortness of breath. 1 Inhaler 1  . carisoprodol (SOMA) 350 MG tablet Take 1 tablet (350 mg total) by mouth 3 (three) times daily as needed for muscle spasms. 15 tablet 0  . cetirizine (ZYRTEC) 10 MG tablet Take 10 mg by mouth daily.    Marland Kitchen NIFEdipine (PROCARDIA-XL/ADALAT CC) 30 MG 24 hr tablet Take 1 tablet (30 mg total) by mouth daily. Reported on 11/07/2015 30 tablet 2  . traMADol (ULTRAM) 50 MG tablet Take 1 tablet (50 mg total) by mouth every 6 (six) hours as needed for moderate pain or severe pain. 30 tablet 0  . hyoscyamine (LEVSIN SL) 0.125 MG SL tablet Place 1 tablet (0.125 mg total) under the tongue every 4 (four) hours as needed. (Patient not taking: Reported on 05/12/2016) 30 tablet 0   No current facility-administered medications on file prior to visit.     Allergies  Allergen Reactions  . Sulfa Antibiotics Nausea And Vomiting  . Ciprofloxacin Nausea And Vomiting  . Effexor [Venlafaxine Hcl] Other (See Comments)    Reaction:  Made pt sleep for two days straight.    Past Medical History:  Diagnosis Date  . Anxiety    Possible  bipolar, Type II  . Asthma   . Bipolar disorder ([place])   . Chronic headaches   . Hemorrhoids 08/2007  . IBS (irritable bowel syndrome) 08/2007  . 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 Kidney infection   . Migraines   . Multiple allergies   . Obese   . Psoriasis     Past Surgical History:  Procedure Laterality Date  . CESAREAN SECTION    . COLONOSCOPY    . COLONOSCOPY W/ BIOPSIES    . EAR TUBE REMOVAL    . ESOPHAGOGASTRODUODENOSCOPY    . LAPAROSCOPIC CHOLECYSTECTOMY  6/09   Dr. Hulen Skains  . TYMPANOSTOMY TUBE PLACEMENT      Family History  Problem Relation Age of Onset  . Colon polyps Maternal Grandfather   . Parkinson's disease Maternal Grandfather   . Breast cancer Mother   . Rheum arthritis Mother   . Irritable bowel syndrome Mother   . Asthma Father   . Anxiety disorder Father     Severe, possible bipolar  . Irritable bowel syndrome Maternal Grandmother   . Diabetes      mat. great uncles  . Alzheimer's disease      Dad's side  . Colon cancer      mat GGF    Social History   Social History  . Marital status: Single    Spouse name: N/A  . Number of children: 1  . Years  of education: N/A   Occupational History  . unemployed    Social History Main Topics  . Smoking status: Never Smoker  . Smokeless tobacco: Never Used  . Alcohol use No  . Drug use: No  . Sexual activity: Yes   Other Topics Concern  . Not on file   Social History Narrative  . No narrative on file   Review of Systems  Appetite is off but able to eat No diarrhea Only occasional post tussive vomiting (after gagging)     Objective:   Physical Exam  Constitutional: No distress.  HENT:  Right Ear: External ear normal.  Left Ear: External ear normal.  Mouth/Throat: Oropharynx is clear and moist. No oropharyngeal exudate.  Neck: No thyromegaly present.  Pulmonary/Chest:  Hard loud coarse cough Mildly decreased breath sounds Mild exp phase prolongation with exp wheezing    Lymphadenopathy:    She has no cervical adenopathy.          Assessment & Plan:

## 2016-05-12 NOTE — Addendum Note (Signed)
Addended by: Pilar Grammes on: 05/12/2016 03:24 PM   Modules accepted: Orders

## 2016-05-12 NOTE — Progress Notes (Signed)
Pre visit review using our clinic review tool, if applicable. No additional management support is needed unless otherwise documented below in the visit note. 

## 2016-05-13 ENCOUNTER — Telehealth: Payer: Self-pay | Admitting: Internal Medicine

## 2016-05-13 ENCOUNTER — Ambulatory Visit: Payer: Medicaid Other | Admitting: Pulmonary Disease

## 2016-05-13 NOTE — Telephone Encounter (Signed)
Noted  

## 2016-05-13 NOTE — Telephone Encounter (Signed)
FYI, we were able to get the patient an appt today with Dr Alva Garnet with [facility] however they just called her and had to reschedule her for two weeks out. That was the soonest appt they could offer. I told her if she needs to be seen sooner she should call her and one of our Dr's could always see her.

## 2016-05-14 ENCOUNTER — Other Ambulatory Visit: Payer: Self-pay | Admitting: Family Medicine

## 2016-05-14 NOTE — Telephone Encounter (Signed)
Last filled 04-21-16 #30 Last OV with Dr Lorelei Pont 11-13-15 saw Dr Silvio Pate for acute issue this week. OV with Dr Silvio Pate 06-18-16

## 2016-05-16 MED ORDER — TRAMADOL HCL 50 MG PO TABS: 50.0000 mg | ORAL_TABLET | Freq: Four times a day (QID) | ORAL | 0 refills | Status: DC | PRN

## 2016-05-18 ENCOUNTER — Emergency Department
Admission: EM | Admit: 2016-05-18 | Discharge: 2016-05-18 | Disposition: A | Discharge status: Home / Self Care | Payer: Medicaid Other | Attending: Emergency Medicine | Admitting: Emergency Medicine

## 2016-05-18 DIAGNOSIS — J45909 Unspecified asthma, uncomplicated: Secondary | ICD-10-CM | POA: Insufficient documentation

## 2016-05-18 DIAGNOSIS — S0501XA Injury of conjunctiva and corneal abrasion without foreign body, right eye, initial encounter: Secondary | ICD-10-CM | POA: Diagnosis not present

## 2016-05-18 DIAGNOSIS — I10 Essential (primary) hypertension: Secondary | ICD-10-CM | POA: Diagnosis not present

## 2016-05-18 DIAGNOSIS — Y929 Unspecified place or not applicable: Secondary | ICD-10-CM | POA: Diagnosis not present

## 2016-05-18 DIAGNOSIS — Z79899 Other long term (current) drug therapy: Secondary | ICD-10-CM | POA: Diagnosis not present

## 2016-05-18 DIAGNOSIS — Y999 Unspecified external cause status: Secondary | ICD-10-CM | POA: Insufficient documentation

## 2016-05-18 DIAGNOSIS — W228XXA Striking against or struck by other objects, initial encounter: Secondary | ICD-10-CM | POA: Diagnosis not present

## 2016-05-18 DIAGNOSIS — Y939 Activity, unspecified: Secondary | ICD-10-CM | POA: Diagnosis not present

## 2016-05-18 MED ORDER — ERYTHROMYCIN 5 MG/GM OP OINT
TOPICAL_OINTMENT | Freq: Once | OPHTHALMIC | Status: AC
  Administered 2016-05-18: 1 via OPHTHALMIC
  Filled 2016-05-18: qty 1

## 2016-05-18 MED ORDER — FLUORESCEIN SODIUM 1 MG OP STRP
ORAL_STRIP | OPHTHALMIC | Status: AC
  Filled 2016-05-18: qty 1

## 2016-05-18 MED ORDER — EYE WASH OPHTH SOLN
OPHTHALMIC | Status: AC
  Filled 2016-05-18: qty 118

## 2016-05-18 MED ORDER — TETRACAINE HCL 0.5 % OP SOLN
OPHTHALMIC | Status: AC
  Filled 2016-05-18: qty 2

## 2016-05-18 MED ORDER — ERYTHROMYCIN 5 MG/GM OP OINT: 1.0000 "application " | TOPICAL_OINTMENT | Freq: Four times a day (QID) | OPHTHALMIC | 0 refills | Status: DC

## 2016-05-18 NOTE — Telephone Encounter (Signed)
Last office visit 05/12/16.  Tramadol not on current medication list.  Refill?

## 2016-05-18 NOTE — ED Provider Notes (Signed)
[facility] Emergency Department Provider Note  ____________________________________________  Time seen: Approximately 1:02 PM  I have reviewed the triage vital signs and the nursing notes.   HISTORY  Chief Complaint Eye Pain    HPI Bridget Maldonado is a 35 y.o. female , NAD, presents to the emergency Department with 2 day history of right eye pain. Patient states she was cleaning out a clothes closet on Friday when she was poked in the right eye with a metal hanger. States the hanger did not go into her eye nor does she note any bleeding. Had mild pain about the site with tearing at the time of injury that has significantly increased over the last 48 hours. Today she has noted redness and swelling about the right eyelids and she has been unable to open the eye without significant pain. States she has sensitivity to light that seems to be worsening. Denies loss of vision but states her vision is blurry. Has been cleansing the eye with saline eye wash and taking Tylenol and ibuprofen which seems to help.   Past Medical History:  Diagnosis Date  . Anxiety    Possible bipolar, Type II  . Asthma   . Bipolar disorder ([place])   . Chronic headaches   . Hemorrhoids 08/2007  . IBS (irritable bowel syndrome) 08/2007  . 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 Kidney infection   . Migraines   . Multiple allergies   . Obese   . Psoriasis     Patient Active Problem List   Diagnosis Date Noted  . Acute recurrent maxillary sinusitis 11/27/2015  . Abdominal pain 11/08/2015  . Chronic cough 10/15/2015  . Abnormal ECG 10/18/2014  . Essential (primary) hypertension 10/18/2014  . Acute asthma flare 05/25/2014  . Arthritis 11/24/2013  . Arthropathia 11/24/2013  . Obstipation 04/12/2013  . Migraine with status migrainosus 09/08/2012  . 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 Mild persistent asthma with acute exacerbation 05/26/2011  . Severe anxiety  with panic 08/29/2006  . Panic disorder without agoraphobia 08/29/2006    Past Surgical History:  Procedure Laterality Date  . CESAREAN SECTION    . COLONOSCOPY    . COLONOSCOPY W/ BIOPSIES    . EAR TUBE REMOVAL    . ESOPHAGOGASTRODUODENOSCOPY    . LAPAROSCOPIC CHOLECYSTECTOMY  6/09   Dr. Hulen Skains  . TYMPANOSTOMY TUBE PLACEMENT      Prior to Admission medications   Medication Sig Start Date End Date Taking? Authorizing Provider  albuterol (PROVENTIL HFA;VENTOLIN HFA) 108 (90 Base) MCG/ACT inhaler Inhale 2 puffs into the lungs every 6 (six) hours as needed for wheezing or shortness of breath. 04/16/16   Venia Carbon, MD  carisoprodol (SOMA) 350 MG tablet Take 1 tablet (350 mg total) by mouth 3 (three) times daily as needed for muscle spasms. 04/03/16 04/03/17  Orbie Pyo, MD  cetirizine (ZYRTEC) 10 MG tablet Take 10 mg by mouth daily.    Historical Provider, MD  erythromycin ophthalmic ointment Place 1 application into the left eye 4 (four) times daily. Apply 1cm ribbon to lower eyelid 4 times daily. 05/18/16   Timberlyn Pickford L Turhan Chill, PA-C  Fluticasone-Salmeterol (ADVAIR) 250-50 MCG/DOSE AEPB Inhale 1 puff into the lungs 2 (two) times daily. 05/12/16   Venia Carbon, MD  HYDROcodone-homatropine [facility]) 5-1.5 MG/5ML syrup Take 5 mLs by mouth at bedtime as needed for cough. 05/12/16   Venia Carbon, MD  NIFEdipine (PROCARDIA-XL/ADALAT CC) 30 MG 24 hr tablet Take  1 tablet (30 mg total) by mouth daily. Reported on 11/07/2015 04/16/16   Venia Carbon, MD    Allergies Sulfa antibiotics; Ciprofloxacin; and Effexor [venlafaxine hcl]  Family History  Problem Relation Age of Onset  . Colon polyps Maternal Grandfather   . Parkinson's disease Maternal Grandfather   . Breast cancer Mother   . Rheum arthritis Mother   . Irritable bowel syndrome Mother   . Asthma Father   . Anxiety disorder Father     Severe, possible bipolar  . Irritable bowel syndrome Maternal Grandmother    . Diabetes      mat. great uncles  . Alzheimer's disease      Dad's side  . Colon cancer      mat GGF    Social History Social History  Substance Use Topics  . Smoking status: Never Smoker  . Smokeless tobacco: Never Used  . Alcohol use No     Review of Systems Constitutional: No fever/chills Eyes: Positive blurred vision right eye. Positive photophobia, redness, irritation, clear drainage and pain right eye. Left eye without abnormality. Musculoskeletal: Negative for neck or facial pain.  Skin: Negative for rash, redness, abnormal warmth, edema. Neurological: Negative for headaches.   ____________________________________________   PHYSICAL EXAM:  VITAL SIGNS: ED Triage Vitals  Enc Vitals Group     BP 05/18/16 1209 (!) 144/103     Pulse Rate 05/18/16 1209 (!) 101     Resp 05/18/16 1209 20     Temp 05/18/16 1209 98.3 F (36.8 C)     Temp Source 05/18/16 1209 Oral     SpO2 05/18/16 1209 98 %     Weight 05/18/16 1207 177 lb (80.3 kg)     Height 05/18/16 1207 5\' 4"  (1.626 m)     Head Circumference --      Peak Flow --      Pain Score 05/18/16 1208 10     Pain Loc --      Pain Edu? --      Excl. in [place]? --    Vital signs at the time of discharge were BP of 130/98 and heart rate of 87 with normal respiration rate of 20.  Constitutional: Alert and oriented. Well appearing and in no acute distress. Eyes: Right conjunctiva with moderate injection and clear watering tearing. Right upper and lower eyelids with trace erythema and mild edema. Left conjunctiva and eyelids without abnormality. PERRLA. EOMI without pain. 2 mm pinpoint area of uptake is noted on fluorescein staining of the right eye without waterfall sign. No pain to palpation of either globes. No orbital pain. Head: Atraumatic. Neck: Supple with full range of motion Hematological/Lymphatic/Immunilogical: No cervical lymphadenopathy. Cardiovascular:  Good peripheral circulation. Respiratory: Normal  respiratory effort without tachypnea or retractions.  Neurologic:  Normal speech and language. No gross focal neurologic deficits are appreciated.  Skin:  Skin is warm, dry and intact. No rash, cellulitis, bruising, edema noted. Psychiatric: Mood and affect are normal. Speech and behavior are normal. Patient exhibits appropriate insight and judgement.   ____________________________________________   LABS  None ____________________________________________  EKG  None ____________________________________________  RADIOLOGY  None ____________________________________________    PROCEDURES  Procedure(s) performed: None   Procedures   Medications  eye wash ((SODIUM/POTASSIUM/SOD CHLORIDE)) ophthalmic solution (not administered)  fluorescein 1 MG ophthalmic strip (not administered)  tetracaine (PONTOCAINE) 0.5 % ophthalmic solution (not administered)  erythromycin ophthalmic ointment (1 application Right Eye Given 05/18/16 1327)     ____________________________________________   INITIAL IMPRESSION /  ASSESSMENT AND PLAN / ED COURSE  Pertinent labs & imaging results that were available during my care of the patient were reviewed by me and considered in my medical decision making (see chart for details).  Clinical Course     Patient's diagnosis is consistent with Corneal abrasion of right eye. Erythromycin ointment was applied to the right eye prior to discharge.  Patient will be discharged home with prescriptions for erythromycin ophthalmic ointment to take as directed. Patient may continue over-the-counter Tylenol or ibuprofen as needed. Patient is to follow up with ophthalmology tomorrow for further evaluation and treatment. Patient is given ED precautions to return to the ED for any worsening or new symptoms.    ____________________________________________  FINAL CLINICAL IMPRESSION(S) / ED DIAGNOSES  Final diagnoses:  Abrasion of right cornea, initial encounter       NEW MEDICATIONS STARTED DURING THIS VISIT:  Discharge Medication List as of 05/18/2016  1:21 PM    START taking these medications   Details  erythromycin ophthalmic ointment Place 1 application into the left eye 4 (four) times daily. Apply 1cm ribbon to lower eyelid 4 times daily., Starting Sun 05/18/2016, Print             Braxton Feathers, PA-C 05/18/16 1352    Lisa Roca, MD 05/18/16 [phone]

## 2016-05-18 NOTE — ED Notes (Signed)
See triage note  States she hit her right eye with a hanger on Friday  Conts to have pain with some swelling to right eye

## 2016-05-18 NOTE — ED Triage Notes (Signed)
Pt reports pain, redness and blurry vision after scratching right eye with a plastic hanger Friday

## 2016-05-19 MED ORDER — TRAMADOL HCL 50 MG PO TABS: 50.0000 mg | ORAL_TABLET | Freq: Four times a day (QID) | ORAL | 0 refills | Status: DC | PRN

## 2016-05-19 NOTE — Telephone Encounter (Signed)
Spoke to pt. SHe is doing better from her visit last week with cough.  She is scheduled to see Dr Silvio Pate 06-18-16. She goes to Rheumatology 07-10-16  Left refill on voice mail at pharmacy

## 2016-05-19 NOTE — Telephone Encounter (Signed)
Approved: #30 x 0 Have her make appt to discuss her use of this med. I don't see follow up with rheumatologist since June

## 2016-05-19 NOTE — Telephone Encounter (Signed)
Tramadol called into [id number] - [place], [place] - [address] Phone: [phone] 

## 2016-05-27 ENCOUNTER — Encounter: Payer: Self-pay | Admitting: Pulmonary Disease

## 2016-05-27 ENCOUNTER — Ambulatory Visit (INDEPENDENT_AMBULATORY_CARE_PROVIDER_SITE_OTHER): Payer: Medicaid Other | Admitting: Pulmonary Disease

## 2016-05-27 VITALS — BP 128/82 | HR 81 | Ht 64.0 in | Wt 182.6 lb

## 2016-05-27 DIAGNOSIS — J4530 Mild persistent asthma, uncomplicated: Secondary | ICD-10-CM | POA: Diagnosis not present

## 2016-05-27 MED ORDER — RANITIDINE HCL 150 MG PO TABS: 150.0000 mg | ORAL_TABLET | Freq: Every day | ORAL | 10 refills | Status: DC

## 2016-05-27 NOTE — Patient Instructions (Signed)
1) Continue Advair twice a day as your controller medication 2) Continue albuterol inhaler as needed for shortness of breath, cough, wheezing or chest tightness 3) Add Zantac 150 mg every night before bed 4) Follow up in 6 weeks or as needed

## 2016-05-28 NOTE — Progress Notes (Signed)
PULMONARY CONSULT NOTE  Requesting MD/Service: Silvio Pate Date of initial consultation: 05/27/16 Reason for consultation: Cough  PT PROFILE: 35 y.o. F never smoker referred for evaluation of several months duration  HPI:  61 F referred for evaluation of cough that has been present intermittently for years and currently for several months. She states that she "coughs all of the time" with scant thick phlegm that is occasionally slightly discolored. She notes that the cough is worse in cold weather. She has a history of frequent "bronchitis". She notes occasional wheezing which has been worse during the current episode. She saw Dr Silvio Pate recently and received a steroid injection a couple of weeks ago as well as Advair. She believes these interventions have improved her symptoms. She describes intermittent DOE which is worse during those times that the cough is worse. She denies CP, fever, purulent sputum, hemoptysis, LE edema and calf tenderness.    Past Medical History:  Diagnosis Date  . Anxiety    Possible bipolar, Type II  . Asthma   . Bipolar disorder ([place])   . Chronic headaches   . Hemorrhoids 08/2007  . IBS (irritable bowel syndrome) 08/2007  . 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 Kidney infection   . Migraines   . Multiple allergies   . Obese   . Psoriasis     Past Surgical History:  Procedure Laterality Date  . CESAREAN SECTION    . COLONOSCOPY    . COLONOSCOPY W/ BIOPSIES    . EAR TUBE REMOVAL    . ESOPHAGOGASTRODUODENOSCOPY    . LAPAROSCOPIC CHOLECYSTECTOMY  6/09   Dr. Hulen Skains  . TYMPANOSTOMY TUBE PLACEMENT      MEDICATIONS: I have reviewed all medications and confirmed regimen as documented  Social History   Social History  . Marital status: Single    Spouse name: N/A  . Number of children: 1  . Years of education: N/A   Occupational History  . unemployed    Social History Main Topics  . Smoking status: Never Smoker  . Smokeless tobacco: Never Used  .  Alcohol use No  . Drug use: No  . Sexual activity: Yes   Other Topics Concern  . Not on file   Social History Narrative  . No narrative on file    Family History  Problem Relation Age of Onset  . Colon polyps Maternal Grandfather   . Parkinson's disease Maternal Grandfather   . Breast cancer Mother   . Rheum arthritis Mother   . Irritable bowel syndrome Mother   . Asthma Father   . Anxiety disorder Father     Severe, possible bipolar  . Irritable bowel syndrome Maternal Grandmother   . Diabetes      mat. great uncles  . Alzheimer's disease      Dad's side  . Colon cancer      mat GGF    ROS: No fever, myalgias/arthralgias, unexplained weight loss or weight gain No new focal weakness or sensory deficits No otalgia, hearing loss, visual changes, nasal and sinus symptoms, mouth and throat problems No neck pain or adenopathy No abdominal pain, N/V/D, diarrhea, change in bowel pattern No dysuria, change in urinary pattern   Vitals:   05/27/16 1532  BP: 128/82  Pulse: 81  SpO2: 98%  Weight: 182 lb 9.6 oz (82.8 kg)  Height: 5\' 4"  (1.626 m)     EXAM:  Gen: WDWN, No overt respiratory distress HEENT: NCAT, sclera white, oropharynx normal Neck: Supple without LAN, thyromegaly,  JVD Lungs: breath sounds: full, percussion: normal, No adventitious sounds Cardiovascular: RRR, no murmurs noted Abdomen: obese, Soft, nontender, normal BS Ext: without clubbing, cyanosis, edema Neuro: CNs grossly intact, motor and sensory intact Skin: Limited exam, no lesions noted  DATA:   BMP Latest Ref Rng & Units 04/03/2016 11/02/2015 11/02/2015  Glucose 65 - 99 mg/dL 109(H) - 120(H)  BUN 6 - 20 mg/dL 11 - 8  Creatinine 0.44 - 1.00 mg/dL 0.79 - 0.52  Sodium 135 - 145 mmol/L 137 - 138  Potassium 3.5 - 5.1 mmol/L 3.8 3.7 2.8(LL)  Chloride 101 - 111 mmol/L 104 - 108  CO2 22 - 32 mmol/L 29 - 23  Calcium 8.9 - 10.3 mg/dL 9.4 - 7.5(L)    CBC Latest Ref Rng & Units 04/03/2016 11/02/2015  11/01/2015  WBC 3.6 - 11.0 K/uL 6.9 5.8 11.2(H)  Hemoglobin 12.0 - 16.0 g/dL 14.2 12.3 14.1  Hematocrit 35.0 - 47.0 % 40.5 36.1 43.0  Platelets 150 - 440 K/uL 238 186 242    CXR (04/03/16): NACPD   Spirometry 05/27/16: normal   IMPRESSION:     ICD-9-CM ICD-10-CM   1. Mild persistent asthma without complication 123456 [id number] Spirometry with Graph     CANCELED: Spirometry with Graph  2. Suspected GERD - as an afterthought, she does admit that she has had persistent HB symptoms since her last pregnancy  Cough, wheezing, dyspnea, frequent bronchitis, history of allergies, improvement with Advair - this history is highly suggestive of asthma and her normal spirometry on this day does not dissuade me from this impression.   PLAN:  We discussed the suspected diagnosis of asthma and management strategies  1) Continue Advair twice a day as controller medication 2) Continue albuterol inhaler as needed for shortness of breath, cough, wheezing or chest tightness 3) Add Zantac 150 mg every night before bed 4) Follow up in 6 weeks or as needed  Merton Border, MD PCCM service Mobile [phone] Pager [phone] 05/28/2016

## 2016-06-10 ENCOUNTER — Encounter: Payer: Self-pay | Admitting: Family Medicine

## 2016-06-10 NOTE — Telephone Encounter (Signed)
Last office visit  05/12/2016 with Dr. Silvio Pate. Last refilled 05/19/2016 for #30 with no refills.  Ok to refill?

## 2016-06-11 MED ORDER — TRAMADOL HCL 50 MG PO TABS: 50.0000 mg | ORAL_TABLET | Freq: Four times a day (QID) | ORAL | 0 refills | Status: DC | PRN

## 2016-06-11 NOTE — Telephone Encounter (Signed)
Left refill on voice mail at pharmacy  

## 2016-06-11 NOTE — Telephone Encounter (Signed)
Approved: 30 x 0 

## 2016-06-18 ENCOUNTER — Telehealth: Payer: Self-pay | Admitting: Internal Medicine

## 2016-06-18 ENCOUNTER — Ambulatory Visit: Payer: Medicaid Other | Admitting: Internal Medicine

## 2016-06-18 DIAGNOSIS — Z0289 Encounter for other administrative examinations: Secondary | ICD-10-CM

## 2016-06-18 NOTE — Telephone Encounter (Signed)
Patient did not come in for their appointment today for 2 month follow up.  Please let me know if patient needs to be contacted immediately for follow up or no follow up needed. °

## 2016-06-19 NOTE — Telephone Encounter (Signed)
Did see pulmonary so follow up can be prn

## 2016-06-23 DIAGNOSIS — N809 Endometriosis, unspecified: Secondary | ICD-10-CM

## 2016-06-23 HISTORY — DX: Endometriosis, unspecified: N80.9

## 2016-06-27 ENCOUNTER — Ambulatory Visit (INDEPENDENT_AMBULATORY_CARE_PROVIDER_SITE_OTHER): Payer: Medicaid Other | Admitting: Family Medicine

## 2016-06-27 ENCOUNTER — Encounter: Payer: Self-pay | Admitting: Family Medicine

## 2016-06-27 VITALS — BP 124/84 | HR 90 | Temp 98.1°F | Wt 181.0 lb

## 2016-06-27 DIAGNOSIS — J069 Acute upper respiratory infection, unspecified: Secondary | ICD-10-CM

## 2016-06-27 DIAGNOSIS — B9789 Other viral agents as the cause of diseases classified elsewhere: Secondary | ICD-10-CM

## 2016-06-27 LAB — POCT INFLUENZA A/B: Influenza A, POC: NEGATIVE

## 2016-06-27 MED ORDER — GUAIFENESIN-CODEINE 100-10 MG/5ML PO SYRP: 5.0000 mL | ORAL_SOLUTION | Freq: Three times a day (TID) | ORAL | 0 refills | Status: DC | PRN

## 2016-06-27 NOTE — Progress Notes (Signed)
Subjective:    Patient ID: Bridget Maldonado, female    DOB: 1980/11/30, 35 y.o.   MRN: [id number]  HPI This is a 36 yo female who presents today with cough and congestion x 1 week, then started to feel worse over last 2 days. Sore throat, fatigue, cough productive of thick mucus. Not sleeping well. Low grade fever. Good fluid intake. Tried a cold formula without relief. Little wheezing. Not using albuterol.  Children have been sick with similar symptoms. Step daughter with positive flu several days ago.   Past Medical History:  Diagnosis Date  . Anxiety    Possible bipolar, Type II  . Asthma   . Bipolar disorder ([place])   . Chronic headaches   . Hemorrhoids 08/2007  . IBS (irritable bowel syndrome) 08/2007  . 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 Kidney infection   . Migraines   . Multiple allergies   . Obese   . Psoriasis    Past Surgical History:  Procedure Laterality Date  . CESAREAN SECTION    . COLONOSCOPY    . COLONOSCOPY W/ BIOPSIES    . EAR TUBE REMOVAL    . ESOPHAGOGASTRODUODENOSCOPY    . LAPAROSCOPIC CHOLECYSTECTOMY  6/09   Dr. Hulen Skains  . TYMPANOSTOMY TUBE PLACEMENT     Family History  Problem Relation Age of Onset  . Colon polyps Maternal Grandfather   . Parkinson's disease Maternal Grandfather   . Breast cancer Mother   . Rheum arthritis Mother   . Irritable bowel syndrome Mother   . Asthma Father   . Anxiety disorder Father     Severe, possible bipolar  . Irritable bowel syndrome Maternal Grandmother   . Diabetes      mat. great uncles  . Alzheimer's disease      Dad's side  . Colon cancer      mat GGF   Social History  Substance Use Topics  . Smoking status: Never Smoker  . Smokeless tobacco: Never Used  . Alcohol use No      Review of Systems Per HPI    Objective:   Physical Exam  Constitutional: She is oriented to person, place, and time. She appears well-developed and well-nourished. No distress.  HENT:  Head: Normocephalic and  atraumatic.  Right Ear: External ear and ear canal normal. Tympanic membrane is scarred.  Left Ear: External ear and ear canal normal. Tympanic membrane is scarred.  Nose: Mucosal edema and rhinorrhea present.  Mouth/Throat: Uvula is midline. Posterior oropharyngeal erythema present. No oropharyngeal exudate or posterior oropharyngeal edema.  Eyes: Conjunctivae are normal.  Neck: Normal range of motion. Neck supple.  Cardiovascular: Normal rate, regular rhythm and normal heart sounds.   Pulmonary/Chest: Effort normal. She has wheezes (few, faint expiratory posterior).  Lymphadenopathy:    She has no cervical adenopathy.  Neurological: She is alert and oriented to person, place, and time.  Skin: Skin is warm and dry. She is not diaphoretic.  Psychiatric: She has a normal mood and affect. Her behavior is normal. Judgment and thought content normal.  Vitals reviewed.    BP 124/84 (BP Location: Left Arm, Patient Position: Sitting, Cuff Size: Normal)   Pulse 90   Temp 98.1 F (36.7 C) (Oral)   Wt 181 lb (82.1 kg)   SpO2 99%   BMI 31.07 kg/m  Wt Readings from Last 3 Encounters:  06/27/16 181 lb (82.1 kg)  05/27/16 182 lb 9.6 oz (82.8 kg)  05/18/16 177 lb (80.3 kg)  POCT Flu- negative Assessment & Plan:  1. Viral URI with cough -  Patient Instructions  Your flu test was negative, you likely have a viral upper respiratory infection  Please use you albuterol inhaler a couple of times a day to see if it helps with your cough  For nasal congestion you can use Afrin nasal spray for 3 days max, Sudafed, saline nasal spray (generic is fine for all). For cough you can try Delsym. Drink enough fluids to make your urine light yellow. For fever/chill/muscle aches you can take over the counter acetaminophen or ibuprofen.   Please come back in if you are not better in 5-7 days or if you develop wheezing, shortness of breath or persistent vomiting.   - guaiFENesin-codeine (ROBITUSSIN  AC) 100-10 MG/5ML syrup; Take 5 mLs by mouth 3 (three) times daily as needed for cough.  Dispense: 120 mL; Refill: 0   Clarene Reamer, FNP-BC  [facility]  06/27/2016 10:15 AM

## 2016-06-27 NOTE — Progress Notes (Signed)
Pre visit review using our clinic review tool, if applicable. No additional management support is needed unless otherwise documented below in the visit note. 

## 2016-06-27 NOTE — Patient Instructions (Signed)
Your flu test was negative, you likely have a viral upper respiratory infection  Please use you albuterol inhaler a couple of times a day to see if it helps with your cough  For nasal congestion you can use Afrin nasal spray for 3 days max, Sudafed, saline nasal spray (generic is fine for all). For cough you can try Delsym. Drink enough fluids to make your urine light yellow. For fever/chill/muscle aches you can take over the counter acetaminophen or ibuprofen.   Please come back in if you are not better in 5-7 days or if you develop wheezing, shortness of breath or persistent vomiting.

## 2016-07-11 ENCOUNTER — Other Ambulatory Visit: Payer: Self-pay | Admitting: Internal Medicine

## 2016-07-11 MED ORDER — TRAMADOL HCL 50 MG PO TABS: 50.0000 mg | ORAL_TABLET | Freq: Four times a day (QID) | ORAL | 0 refills | Status: DC | PRN

## 2016-07-11 NOTE — Telephone Encounter (Signed)
Left refill on voice mail at pharmacy  

## 2016-07-11 NOTE — Telephone Encounter (Signed)
Last filled 06-11-16 #30 Last OV 06-27-16 No Future OV

## 2016-07-11 NOTE — Telephone Encounter (Signed)
Approved: 30 x 0 

## 2016-07-17 ENCOUNTER — Ambulatory Visit: Payer: Medicaid Other | Admitting: Pulmonary Disease

## 2016-07-17 ENCOUNTER — Telehealth: Payer: Self-pay | Admitting: Internal Medicine

## 2016-07-17 NOTE — Telephone Encounter (Signed)
Please check on her tomorrow---seems like she might be best seen by her gyn

## 2016-07-17 NOTE — Telephone Encounter (Signed)
Patient Name: Bridget Maldonado  DOB: 11/06/1980    Initial Comment Caller states she has been hurting, like a knot is on her ovary on right side. Hurts anyway she turns. She would like to figure this out and what to do.    Nurse Assessment  Nurse: Raphael Gibney, RN, Vanita Ingles Date/Time Eilene Ghazi Time): 07/17/2016 2:33:47 PM  Confirm and document reason for call. If symptomatic, describe symptoms. ---Caller states she is having pain in her lower right abd. About an inch above her C section scar. Has a knot in the painful area. Hurts if something touches it. Started her period this week. Knot is about the size of a pea.  Does the patient have any new or worsening symptoms? ---Yes  Will a triage be completed? ---Yes  Related visit to physician within the last 2 weeks? ---No  Does the PT have any chronic conditions? (i.e. diabetes, asthma, etc.) ---Yes  List chronic conditions. ---arthritis; asthma;  Is the patient pregnant or possibly pregnant? (Ask all females between the ages of 64-55) ---No  Is this a behavioral health or substance abuse call? ---No     Guidelines    Guideline Title Affirmed Question Affirmed Notes  Abdominal Pain - Female [1] MILD-MODERATE pain AND [2] constant AND [3] present > 2 hours    Final Disposition User   See Physician within 4 Hours (or PCP triage) Raphael Gibney, RN, [place]    Comments  no appts available at [facility] or [place]. States she will go to the urgent care by [place]   Disagree/Comply: Comply

## 2016-07-18 ENCOUNTER — Ambulatory Visit: Payer: Medicaid Other | Admitting: Pulmonary Disease

## 2016-07-18 ENCOUNTER — Emergency Department: Payer: Medicaid Other

## 2016-07-18 ENCOUNTER — Emergency Department
Admission: EM | Admit: 2016-07-18 | Discharge: 2016-07-18 | Disposition: A | Discharge status: Home / Self Care | Payer: Medicaid Other | Attending: Emergency Medicine | Admitting: Emergency Medicine

## 2016-07-18 ENCOUNTER — Ambulatory Visit: Payer: Medicaid Other | Admitting: Internal Medicine

## 2016-07-18 DIAGNOSIS — R1031 Right lower quadrant pain: Secondary | ICD-10-CM | POA: Diagnosis not present

## 2016-07-18 DIAGNOSIS — Z79899 Other long term (current) drug therapy: Secondary | ICD-10-CM | POA: Insufficient documentation

## 2016-07-18 DIAGNOSIS — J45909 Unspecified asthma, uncomplicated: Secondary | ICD-10-CM | POA: Diagnosis not present

## 2016-07-18 DIAGNOSIS — R109 Unspecified abdominal pain: Secondary | ICD-10-CM

## 2016-07-18 LAB — URINALYSIS, COMPLETE (UACMP) WITH MICROSCOPIC
Bacteria, UA: NONE SEEN
Bilirubin Urine: NEGATIVE
GLUCOSE, UA: NEGATIVE mg/dL
Ketones, ur: NEGATIVE mg/dL
LEUKOCYTES UA: NEGATIVE
NITRITE: NEGATIVE
PROTEIN: 30 mg/dL — AB
Specific Gravity, Urine: 1.027 (ref 1.005–1.030)
pH: 5 (ref 5.0–8.0)

## 2016-07-18 LAB — COMPREHENSIVE METABOLIC PANEL
ALT: 23 U/L (ref 14–54)
ANION GAP: 8 (ref 5–15)
AST: 26 U/L (ref 15–41)
Albumin: 4.3 g/dL (ref 3.5–5.0)
Alkaline Phosphatase: 109 U/L (ref 38–126)
BILIRUBIN TOTAL: 0.3 mg/dL (ref 0.3–1.2)
BUN: 14 mg/dL (ref 6–20)
CALCIUM: 8.9 mg/dL (ref 8.9–10.3)
CO2: 22 mmol/L (ref 22–32)
Chloride: 108 mmol/L (ref 101–111)
Creatinine, Ser: 0.73 mg/dL (ref 0.44–1.00)
GLUCOSE: 98 mg/dL (ref 65–99)
Potassium: 3.5 mmol/L (ref 3.5–5.1)
Sodium: 138 mmol/L (ref 135–145)
TOTAL PROTEIN: 7.8 g/dL (ref 6.5–8.1)

## 2016-07-18 LAB — CBC
HCT: 43.4 % (ref 35.0–47.0)
HEMOGLOBIN: 14.5 g/dL (ref 12.0–16.0)
MCH: 28.5 pg (ref 26.0–34.0)
MCHC: 33.3 g/dL (ref 32.0–36.0)
MCV: 85.5 fL (ref 80.0–100.0)
Platelets: 255 10*3/uL (ref 150–440)
RBC: 5.07 MIL/uL (ref 3.80–5.20)
RDW: 13.3 % (ref 11.5–14.5)
WBC: 6.1 10*3/uL (ref 3.6–11.0)

## 2016-07-18 LAB — LIPASE, BLOOD: LIPASE: 33 U/L (ref 11–51)

## 2016-07-18 LAB — POCT PREGNANCY, URINE: Preg Test, Ur: NEGATIVE

## 2016-07-18 MED ORDER — ONDANSETRON HCL 4 MG/2ML IJ SOLN
4.0000 mg | Freq: Once | INTRAMUSCULAR | Status: AC
  Administered 2016-07-18: 4 mg via INTRAVENOUS
  Filled 2016-07-18: qty 2

## 2016-07-18 MED ORDER — LIDOCAINE 5 % EX PTCH: 1.0000 | MEDICATED_PATCH | CUTANEOUS | 0 refills | Status: DC

## 2016-07-18 MED ORDER — IOPAMIDOL (ISOVUE-300) INJECTION 61%
30.0000 mL | Freq: Once | INTRAVENOUS | Status: AC | PRN
  Administered 2016-07-18: 30 mL via ORAL
  Filled 2016-07-18: qty 30

## 2016-07-18 MED ORDER — IOPAMIDOL (ISOVUE-300) INJECTION 61%
100.0000 mL | Freq: Once | INTRAVENOUS | Status: AC | PRN
  Administered 2016-07-18: 100 mL via INTRAVENOUS
  Filled 2016-07-18: qty 100

## 2016-07-18 MED ORDER — CEPHALEXIN 500 MG PO CAPS: 500.0000 mg | ORAL_CAPSULE | Freq: Four times a day (QID) | ORAL | 0 refills | Status: AC

## 2016-07-18 MED ORDER — SODIUM CHLORIDE 0.9 % IV BOLUS (SEPSIS)
500.0000 mL | Freq: Once | INTRAVENOUS | Status: AC
  Administered 2016-07-18: 500 mL via INTRAVENOUS

## 2016-07-18 NOTE — ED Provider Notes (Signed)
Time Seen: Approximately [id number]  I have reviewed the triage notes  Chief Complaint: Abdominal Pain   History of Present Illness: Bridget Maldonado is a 36 y.o. female who presents with some right-sided abdominal pain that she's noticed over the last 3 days. She states the pain is relatively constant she's felt a knot over her right lower abdominal region and points toward the right lower quadrant toward the groin area. She states that she noticed some tenderness with contact when she lies on that area at night to sleep and also when she was playing with her child and has an exacerbation of pain. Eyes any fever or chills productive cough. She denies any significant loose stool or diarrhea. He states he was started on methotrexate earlier this week for psoriatic arthritis. She denies any hematemesis or biliary emesis. She denies any constipation. Patient was referred here from a local clinic for evaluation of her abdominal pain.   Past Medical History:  Diagnosis Date  . Anxiety    Possible bipolar, Type II  . Asthma   . Bipolar disorder ([place])   . Chronic headaches   . Hemorrhoids 08/2007  . IBS (irritable bowel syndrome) 08/2007  . 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 Kidney infection   . Migraines   . Multiple allergies   . Obese   . Psoriasis     Patient Active Problem List   Diagnosis Date Noted  . Acute recurrent maxillary sinusitis 11/27/2015  . Abdominal pain 11/08/2015  . Chronic cough 10/15/2015  . Abnormal ECG 10/18/2014  . Essential (primary) hypertension 10/18/2014  . Acute asthma flare 05/25/2014  . Arthritis 11/24/2013  . Arthropathia 11/24/2013  . Obstipation 04/12/2013  . Migraine with status migrainosus 09/08/2012  . 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 Mild persistent asthma with acute exacerbation 05/26/2011  . Severe anxiety with panic 08/29/2006  . Panic disorder without agoraphobia 08/29/2006    Past Surgical History:   Procedure Laterality Date  . CESAREAN SECTION    . COLONOSCOPY    . COLONOSCOPY W/ BIOPSIES    . EAR TUBE REMOVAL    . ESOPHAGOGASTRODUODENOSCOPY    . LAPAROSCOPIC CHOLECYSTECTOMY  6/09   Dr. Hulen Skains  . TYMPANOSTOMY TUBE PLACEMENT    Patient's had previous tubal ligation. She denies any risk of being currently pregnant  Past Surgical History:  Procedure Laterality Date  . CESAREAN SECTION    . COLONOSCOPY    . COLONOSCOPY W/ BIOPSIES    . EAR TUBE REMOVAL    . ESOPHAGOGASTRODUODENOSCOPY    . LAPAROSCOPIC CHOLECYSTECTOMY  6/09   Dr. Hulen Skains  . TYMPANOSTOMY TUBE PLACEMENT      Current Outpatient Rx  . Order #: [id number] Class: Normal  . Order #: [id number] Class: Historical Med  . Order #: [id number] Class: Print  . Order #: [id number] Class: Normal  . Order #: [id number] Class: Print  . Order #: [id number] Class: Normal  . Order #: [id number] Class: Normal  . Order #: [id number] Class: Phone In    Allergies:  Sulfa antibiotics; Ciprofloxacin; and Effexor [venlafaxine hcl]  Family History: Family History  Problem Relation Age of Onset  . Colon polyps Maternal Grandfather   . Parkinson's disease Maternal Grandfather   . Breast cancer Mother   . Rheum arthritis Mother   . Irritable bowel syndrome Mother   . Asthma Father   . Anxiety disorder Father     Severe, possible bipolar  . Irritable bowel syndrome Maternal Grandmother   . Diabetes  mat. great uncles  . Alzheimer's disease      Dad's side  . Colon cancer      mat GGF    Social History: Social History  Substance Use Topics  . Smoking status: Never Smoker  . Smokeless tobacco: Never Used  . Alcohol use No     Review of Systems:   10 point review of systems was performed and was otherwise negative:  Constitutional: No fever Eyes: No visual disturbances ENT: No sore throat, ear pain Cardiac: No chest pain Respiratory: No shortness of breath, wheezing, or stridor Abdomen: No abdominal pain, no vomiting, No  diarrhea Endocrine: No weight loss, No night sweats Extremities: No peripheral edema, cyanosis Skin: No rashes, easy bruising Neurologic: No focal weakness, trouble with speech or swollowing Urologic: No dysuria, Hematuria, or urinary frequency   Physical Exam:  ED Triage Vitals [07/18/16 1154]  Enc Vitals Group     BP (!) 168/111     Pulse Rate (!) 106     Resp 20     Temp 98.2 F (36.8 C)     Temp Source Oral     SpO2 98 %     Weight      Height      Head Circumference      Peak Flow      Pain Score 10     Pain Loc      Pain Edu?      Excl. in [place]?     General: Awake , Alert , and Oriented times 3; GCS 15 Head: Normal cephalic , atraumatic Eyes: Pupils equal , round, reactive to light Nose/Throat: No nasal drainage, patent upper airway without erythema or exudate.  Neck: Supple, Full range of motion, No anterior adenopathy or palpable thyroid masses Lungs: Clear to ascultation without wheezes , rhonchi, or rales Heart: Regular rate, regular rhythm without murmurs , gallops , or rubs Abdomen: Soft some mild tenderness over the right lower abdominal region without any rebound guarding or rigidity. Negative Rovsing sign. She has a small area that feels like a possible reducible hernia on palpation. No obvious peritoneal signs. He states he's noticed it more when she is standing and there is no reproducible inguinal discomfort.       Extremities: 2 plus symmetric pulses. No edema, clubbing or cyanosis Neurologic: normal ambulation, Motor symmetric without deficits, sensory intact Skin: warm, dry, no rashes I felt currently pelvic exam was not necessary.  Labs:   All laboratory work was reviewed including any pertinent negatives or positives listed below:  Labs Reviewed  URINALYSIS, COMPLETE (UACMP) WITH MICROSCOPIC - Abnormal; Notable for the following:       Result Value   Color, Urine YELLOW (*)    APPearance CLEAR (*)    Hgb urine dipstick MODERATE (*)    Protein,  ur 30 (*)    Squamous Epithelial / LPF 0-5 (*)    All other components within normal limits  LIPASE, BLOOD  COMPREHENSIVE METABOLIC PANEL  CBC  POCT PREGNANCY, URINE   Radiology:   "Ct Abdomen Pelvis W Contrast  Result Date: 07/18/2016 CLINICAL DATA:  Lower RIGHT abdominal pain, nausea, and vomiting, start methotrexate on Wednesday since, not feeling well, headache, not RIGHT lower quadrant for 3 days with swelling EXAM: CT ABDOMEN AND PELVIS WITH CONTRAST TECHNIQUE: Multidetector CT imaging of the abdomen and pelvis was performed using the standard protocol following bolus administration of intravenous contrast. Sagittal and coronal MPR images reconstructed from axial data set.  CONTRAST:  173mL ISOVUE-300 IOPAMIDOL (ISOVUE-300) INJECTION 61% IV. Dilute oral contrast. COMPARISON:  11/01/2015 FINDINGS: Lower chest: Lung bases clear Hepatobiliary: Gallbladder surgically absent. Liver normal appearance. Pancreas: Normal appearance Spleen: Normal appearance Adrenals/Urinary Tract: Adrenal glands, kidneys, ureters, and bladder normal appearance Stomach/Bowel: Appendix not visualized but no pericecal inflammatory process is seen. Colon un opacified, grossly unremarkable. Stomach and small bowel loops normal appearance. Vascular/Lymphatic: Aorta normal caliber without aneurysm. No adenopathy. Reproductive: Unremarkable uterus. Normal sized ovaries. Prior tubal ligation. Other: No free air free fluid. No hernia. Bilobed subcutaneous abnormality anterior to the inferior RIGHT rectus abdominus muscle in the pelvis images 74-79 with a small subcutaneous fluid collection 13 mm greatest size medially as well as associated subcutaneous nodule 14 mm diameterlaterally, both demonstrating mild confluent stranding. These could reflect trauma/hematoma, infection, subcutaneous injection, or site of prior surgery. This appears progressive since the previous exam when only mild stranding was seen at this site, mass not  completely excluded. Musculoskeletal: Scattered Schmorl's nodes with degenerative disc disease changes at T11-T12. IMPRESSION: No acute intra-abdominal or intrapelvic abnormalities. Nonvisualization of appendix without pericecal inflammatory process seen. Subcutaneous bilobed nodular infiltrative change and fluid collection in the anterior abdominal wall of the RIGHT pelvis, question hematoma/trauma, infection, site of subcutaneous injection, or prior surgery, progressive since 2017. Mass not excluded and clinical followup until resolution recommended. Electronically Signed   By: Lavonia Dana M.D.   On: 07/18/2016 15:49  "   I personally reviewed the radiologic studies    ED Course:  Patient's stay here was uneventful and I could not make a clinical diagnosis on what was cited by CAT scan evaluation. Patient has tenderness over this area but no signs of external infection such as a fever, elevated white blood cell count, etc. He does not appear to be a hernia which was the main clinical concern. The patient was referred to general surgery for possible needle drainage, etc. She was advised to discuss with the prescribing physician the methotrexate. She denies any focal injection over this site that does make her immunocompromise. Prescribed her Keflex. I felt this was unlikely to be necrotizing fasciitis based on the clinical presentation today. He was cautioned to return here to emergency department if the pain gets worse she develops a fever or any other new concerns.     Assessment:  Cystic inflammation right lower quadrant      Plan: Outpatient " Discharge Medication List as of 07/18/2016  4:57 PM    START taking these medications   Details  cephALEXin (KEFLEX) 500 MG capsule Take 1 capsule (500 mg total) by mouth 4 (four) times daily., Starting Fri 07/18/2016, Until Fri 07/25/2016, Print    lidocaine (LIDODERM) 5 % Place 1 patch onto the skin daily., Starting Fri 07/18/2016, Print       "  Patient was advised to return immediately if condition worsens. Patient was advised to follow up with their primary care physician or other specialized physicians involved in their outpatient care. The patient and/or family member/power of attorney had laboratory results reviewed at the bedside. All questions and concerns were addressed and appropriate discharge instructions were distributed by the nursing staff.             Daymon Larsen, MD 07/18/16 [phone]

## 2016-07-18 NOTE — ED Notes (Signed)
Pt ambulatory to toilet by self.  

## 2016-07-18 NOTE — Discharge Instructions (Signed)
Please contact the surgeon for further follow-up. Please return emergency Department for high fever, increasing abdominal pain, bloody stool, or any other new concerns.  You can take over-the-counter Tylenol or ibuprofen for pain. Please contact her primary physician or the prescribing doctor ad lib. methotrexate to see if you should continue.  Please return immediately if condition worsens. Please contact her primary physician or the physician you were given for referral. If you have any specialist physicians involved in her treatment and plan please also contact them. Thank you for using  regional emergency Department.

## 2016-07-18 NOTE — ED Notes (Signed)
CT notified that pt done drinking contrast

## 2016-07-18 NOTE — Telephone Encounter (Signed)
She has an appt with Rollene Fare today at [facility]. She called [facility] yesterday and they told her the 1st available appt was the end of February. She may go to [facility] this morning.

## 2016-07-18 NOTE — ED Triage Notes (Signed)
Pt presents to ED for lower R abdominal and N&V. Started methotrexate Wednesday. States not feeling well, HA, N&V. Denies diarrhea or constipation. Went to [facility] before coming to ED, sent her over here. Pt states can feel a knot in RLQ that has been there 3 days, states RLQ is swollen.

## 2016-07-21 ENCOUNTER — Telehealth: Payer: Self-pay | Admitting: Internal Medicine

## 2016-07-21 NOTE — Telephone Encounter (Signed)
Left message to call office to see how she is doing after ER visit.

## 2016-07-21 NOTE — Telephone Encounter (Signed)
Okay Will wait to see what their evaluation shows

## 2016-07-21 NOTE — Telephone Encounter (Signed)
Patient returned Shannon's call.  Patient is going to gyn at [facility] tomorrow.  Patient had tubes tied in 2016 and they lost 2 clips. So, they think it could possibly a clip attached to her abdominal wall.

## 2016-08-10 ENCOUNTER — Other Ambulatory Visit: Payer: Self-pay | Admitting: Internal Medicine

## 2016-08-11 ENCOUNTER — Encounter: Payer: Self-pay | Admitting: Internal Medicine

## 2016-08-11 ENCOUNTER — Other Ambulatory Visit: Payer: Self-pay | Admitting: Internal Medicine

## 2016-08-11 MED ORDER — TRAMADOL HCL 50 MG PO TABS: 50.0000 mg | ORAL_TABLET | Freq: Four times a day (QID) | ORAL | 0 refills | Status: DC | PRN

## 2016-08-11 NOTE — Telephone Encounter (Signed)
Approved: 30 x 0 

## 2016-08-11 NOTE — Telephone Encounter (Signed)
Last filled 07-11-16 #30 Last OV acute 15-18 No future ov

## 2016-08-11 NOTE — Telephone Encounter (Signed)
Left refill on voice mail at pharmacy  

## 2016-08-12 ENCOUNTER — Ambulatory Visit (INDEPENDENT_AMBULATORY_CARE_PROVIDER_SITE_OTHER): Payer: Medicaid Other | Admitting: Internal Medicine

## 2016-08-12 ENCOUNTER — Encounter: Payer: Self-pay | Admitting: Internal Medicine

## 2016-08-12 VITALS — BP 134/88 | HR 118 | Temp 99.6°F | Wt 183.0 lb

## 2016-08-12 DIAGNOSIS — J101 Influenza due to other identified influenza virus with other respiratory manifestations: Secondary | ICD-10-CM | POA: Diagnosis not present

## 2016-08-12 LAB — POCT INFLUENZA A/B
Influenza A, POC: POSITIVE — AB
Influenza B, POC: NEGATIVE

## 2016-08-12 MED ORDER — OSELTAMIVIR PHOSPHATE 75 MG PO CAPS: 75.0000 mg | ORAL_CAPSULE | Freq: Two times a day (BID) | ORAL | 0 refills | Status: DC

## 2016-08-12 NOTE — Patient Instructions (Signed)

## 2016-08-12 NOTE — Progress Notes (Addendum)
HPI  Pt presents to the clinic today with c/o headache, left ear pain, nasal congestion and sore throat. This started last night. She denies decreased hearing out of her left ear. She is not blowing anything out of her nose. She denies difficulty swallowing. She has run low grade fevers < 100, had chills, body aches and fatigue. She has not taken anything OTC for her symptoms. She does have a history of chronic headaches, allergies and asthma. She reports she started taking MTX 1 month ago. She has had sick contacts, some diagnosed with the flu. She does not take her flu shot.   Review of Systems        Past Medical History:  Diagnosis Date  . Anxiety    Possible bipolar, Type II  . Asthma   . Bipolar disorder ([place])   . Chronic headaches   . Hemorrhoids 08/2007  . IBS (irritable bowel syndrome) 08/2007  . 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 Kidney infection   . Migraines   . Multiple allergies   . Obese   . Psoriasis     Family History  Problem Relation Age of Onset  . Colon polyps Maternal Grandfather   . Parkinson's disease Maternal Grandfather   . Breast cancer Mother   . Rheum arthritis Mother   . Irritable bowel syndrome Mother   . Asthma Father   . Anxiety disorder Father     Severe, possible bipolar  . Irritable bowel syndrome Maternal Grandmother   . Diabetes      mat. great uncles  . Alzheimer's disease      Dad's side  . Colon cancer      mat GGF    Social History   Social History  . Marital status: Single    Spouse name: N/A  . Number of children: 1  . Years of education: N/A   Occupational History  . unemployed    Social History Main Topics  . Smoking status: Never Smoker  . Smokeless tobacco: Never Used  . Alcohol use No  . Drug use: No  . Sexual activity: Yes   Other Topics Concern  . Not on file   Social History Narrative  . No narrative on file    Allergies  Allergen Reactions  . Sulfa Antibiotics Nausea And Vomiting  .  Ciprofloxacin Nausea And Vomiting  . Effexor [Venlafaxine Hcl] Other (See Comments)    Reaction:  Made pt sleep for two days straight.     Constitutional: Positive headache, fatigue and fever. Denies abrupt weight changes.  HEENT:  Positive nasal congestion, sore throat. Denies eye redness, eye pain, pressure behind the eyes, facial pain, ear pain, ringing in the ears, wax buildup, runny nose or bloody nose. Respiratory: Denies cough, difficulty breathing or shortness of breath.  Cardiovascular: Denies chest pain, chest tightness, palpitations or swelling in the hands or feet.   No other specific complaints in a complete review of systems (except as listed in HPI above).  Objective:   BP 134/88   Pulse (!) 118   Temp 99.6 F (37.6 C) (Oral)   Wt 183 lb (83 kg)   LMP 07/15/2016 Comment: neg preg  SpO2 97%   BMI 31.41 kg/m   Wt Readings from Last 3 Encounters:  08/12/16 183 lb (83 kg)  06/27/16 181 lb (82.1 kg)  05/27/16 182 lb 9.6 oz (82.8 kg)     General: Appears her stated age, in NAD. HEENT: Head: normal shape and size, no sinus  tenderness noted;  Ears: Tm's gray with scar tissue present, normal light reflex; Nose: mucosa boggy and moist, septum midline; Throat/Mouth: + PND. Teeth present, mucosa pink and moist, no exudate noted, no lesions or ulcerations noted.  Neck: No cervical lymphadenopathy.  Cardiovascular: Tachycardic with normal rhythm. S1,S2 noted.  No murmur, rubs or gallops noted.  Pulmonary/Chest: Normal effort and positive vesicular breath sounds. No respiratory distress. No wheezes, rales or ronchi noted.       Assessment & Plan:   Influenza A:  Rapid Flu: positive Get some rest and drink plenty of water Do salt water gargles for the sore throat Ibuprofen/Tylenol for fever and body aches eRx for Tamiflu 75 mg BID x 5 days  RTC as needed or if symptoms persist.   Webb Silversmith, NP

## 2016-08-12 NOTE — Addendum Note (Signed)
Addended by: Lurlean Nanny on: 08/12/2016 11:55 AM   Modules accepted: Orders

## 2016-08-13 ENCOUNTER — Telehealth: Payer: Self-pay

## 2016-08-13 NOTE — Telephone Encounter (Signed)
Noted. Does she need something for cough or nausea?

## 2016-08-13 NOTE — Telephone Encounter (Signed)
Pt left v/m; pt seen 08/12/16 and dx with flu; last night pt started coughing worse. This morning still a lot of prod coughing and pt is nauseated and started vomiting yesterday late afternoon. Pt was to cb if any symptoms changed. [facility]. Pt request cb.

## 2016-08-13 NOTE — Telephone Encounter (Signed)
Left detailed msg on VM per HIPAA  

## 2016-08-19 ENCOUNTER — Encounter: Payer: Self-pay | Admitting: Internal Medicine

## 2016-08-19 MED ORDER — BENZONATATE 200 MG PO CAPS: 200.0000 mg | ORAL_CAPSULE | Freq: Three times a day (TID) | ORAL | 0 refills | Status: DC | PRN

## 2016-08-19 NOTE — Telephone Encounter (Signed)
Please let her know that I sent a prescription for cough med for her. It is safe to take this med during the day and at night

## 2016-08-19 NOTE — Addendum Note (Signed)
Addended by: Viviana Simpler I on: 08/19/2016 01:31 PM   Modules accepted: Orders

## 2016-08-19 NOTE — Telephone Encounter (Signed)
Magda Paganini notified by telephone that Dr. Silvio Pate sent her in a Rx for benzonatate to her pharmacy for cough.

## 2016-08-19 NOTE — Telephone Encounter (Signed)
Pt does have a cough and request med for cough to [facility]. Pt request cb.

## 2016-08-20 MED ORDER — HYDROCOD POLST-CPM POLST ER 10-8 MG/5ML PO SUER: 5.0000 mL | Freq: Every evening | ORAL | 0 refills | Status: DC | PRN

## 2016-09-04 ENCOUNTER — Ambulatory Visit: Payer: Medicaid Other | Admitting: Pulmonary Disease

## 2016-09-08 ENCOUNTER — Encounter: Payer: Self-pay | Admitting: Pulmonary Disease

## 2016-09-08 ENCOUNTER — Ambulatory Visit (INDEPENDENT_AMBULATORY_CARE_PROVIDER_SITE_OTHER): Payer: Medicaid Other | Admitting: Pulmonary Disease

## 2016-09-08 VITALS — BP 122/82 | HR 82 | Ht 64.0 in | Wt 181.0 lb

## 2016-09-08 DIAGNOSIS — J4530 Mild persistent asthma, uncomplicated: Secondary | ICD-10-CM

## 2016-09-08 NOTE — Progress Notes (Signed)
PULMONARY OFFICE FOLLOW UP NOTE  Requesting MD/Service: Silvio Pate Date of initial consultation: 05/27/16 Reason for consultation: Cough  PT PROFILE: 36 y.o. F never smoker referred for evaluation of cough of several months duration. Initial history notable for "frequent bronchitis" dating back to childhood and worsened cough during cold weather months. Initial history was also notable for allergies. Initial impression was suspected mild persistent asthma and possible GERD initial treatment included Advair, PRN albuterol and ranitidine.  DATA: CXR 04/03/16: NACPD Office spiro 05/27/16: normal  SUBJ: She returns today for routine reevaluation. Since initiating Advair, her cough is nearly completely resolved. All of her respiratory symptoms are resolved or improved. She rarely uses albuterol inhaler. She is only taking Zantac as needed for reflux or heartburn symptoms. She does note some increase in allergy symptoms as pollen season has arrived.   Vitals:   09/08/16 1147  BP: 122/82  Pulse: 82  SpO2: 96%  Weight: 181 lb (82.1 kg)  Height: 5\' 4"  (1.626 m)     EXAM:  Gen: NAD HEENT: Normal Neck: No JVD Lungs: Breath sounds full without wheezes or other adventitious sounds Cardiovascular: RRR, no murmurs Abdomen: obese, Soft, nontender, normal BS Ext: without clubbing, cyanosis, edema Neuro: Grossly intact  DATA:   BMP Latest Ref Rng & Units 07/18/2016 04/03/2016 11/02/2015  Glucose 65 - 99 mg/dL 98 109(H) -  BUN 6 - 20 mg/dL 14 11 -  Creatinine 0.44 - 1.00 mg/dL 0.73 0.79 -  Sodium 135 - 145 mmol/L 138 137 -  Potassium 3.5 - 5.1 mmol/L 3.5 3.8 3.7  Chloride 101 - 111 mmol/L 108 104 -  CO2 22 - 32 mmol/L 22 29 -  Calcium 8.9 - 10.3 mg/dL 8.9 9.4 -    CBC Latest Ref Rng & Units 07/18/2016 04/03/2016 11/02/2015  WBC 3.6 - 11.0 K/uL 6.1 6.9 5.8  Hemoglobin 12.0 - 16.0 g/dL 14.5 14.2 12.3  Hematocrit 35.0 - 47.0 % 43.4 40.5 36.1  Platelets 150 - 440 K/uL 255 238 186    No new  chest x-ray or spirometry  IMPRESSION:   1) Mild persistent asthma - she has responded very well to Advair 2) intermittent gastroesophageal reflux 3) seasonal allergies  PLAN:  Continue Advair twice a day as controller medication Continue albuterol inhaler as needed Continue ranitidine as needed for reflux symptoms Suggested cetirizine as needed for allergy symptoms during the heavy pollen months Follow-up as needed  Merton Border, MD PCCM service Mobile [phone] Pager [phone] 09/08/2016

## 2016-09-08 NOTE — Patient Instructions (Signed)
Continue Advair  Continue albuterol inhaler as needed Use Zyrtec as needed for allergy symptoms during the heavy-pollen months  Follow up as needed

## 2016-09-10 ENCOUNTER — Other Ambulatory Visit: Payer: Self-pay | Admitting: Internal Medicine

## 2016-09-10 NOTE — Telephone Encounter (Signed)
Last filled 08-11-16 #30 Multiple Acute OVs in the last year. No Future OV. No Drug Screen on file

## 2016-09-11 ENCOUNTER — Other Ambulatory Visit: Payer: Self-pay | Admitting: Internal Medicine

## 2016-09-11 MED ORDER — TRAMADOL HCL 50 MG PO TABS
50.0000 mg | ORAL_TABLET | Freq: Four times a day (QID) | ORAL | 0 refills | Status: DC | PRN
Start: 2016-09-11 — End: 2016-10-28

## 2016-09-11 NOTE — Telephone Encounter (Signed)
Approved: #30 x 0 Let her know she should have a routine follow up to review her status due to continued need of a controlled substance

## 2016-09-11 NOTE — Telephone Encounter (Signed)
Left refill on voice mail at pharmacy Left detailed message on VM per DPR that she needs to schedule an ov to followup on pain and tramadol.

## 2016-10-08 ENCOUNTER — Ambulatory Visit: Payer: Medicaid Other | Admitting: Internal Medicine

## 2016-10-17 ENCOUNTER — Other Ambulatory Visit: Payer: Self-pay | Admitting: Internal Medicine

## 2016-10-17 ENCOUNTER — Encounter: Payer: Self-pay | Admitting: Internal Medicine

## 2016-10-28 ENCOUNTER — Encounter: Payer: Self-pay | Admitting: Internal Medicine

## 2016-10-28 ENCOUNTER — Encounter: Payer: Self-pay | Admitting: Radiology

## 2016-10-28 ENCOUNTER — Ambulatory Visit (INDEPENDENT_AMBULATORY_CARE_PROVIDER_SITE_OTHER): Payer: Medicaid Other | Admitting: Internal Medicine

## 2016-10-28 VITALS — BP 130/88 | HR 94 | Temp 98.0°F | Wt 183.0 lb

## 2016-10-28 DIAGNOSIS — J029 Acute pharyngitis, unspecified: Secondary | ICD-10-CM | POA: Insufficient documentation

## 2016-10-28 DIAGNOSIS — G8929 Other chronic pain: Secondary | ICD-10-CM | POA: Insufficient documentation

## 2016-10-28 DIAGNOSIS — M545 Low back pain, unspecified: Secondary | ICD-10-CM | POA: Insufficient documentation

## 2016-10-28 DIAGNOSIS — L4050 Arthropathic psoriasis, unspecified: Secondary | ICD-10-CM

## 2016-10-28 MED ORDER — TRAMADOL HCL 50 MG PO TABS: 50.0000 mg | ORAL_TABLET | Freq: Four times a day (QID) | ORAL | 0 refills | Status: DC | PRN

## 2016-10-28 NOTE — Assessment & Plan Note (Signed)
Probably will need other Rx Going back to Dr Meda Coffee

## 2016-10-28 NOTE — Assessment & Plan Note (Signed)
Trying massage, etc Uses the tramadol intermittently Checked CSRS--- 8 oxycodone post op . Got 15 soma in October---doesn't remember why. Discussed requirements

## 2016-10-28 NOTE — Progress Notes (Signed)
Pre visit review using our clinic review tool, if applicable. No additional management support is needed unless otherwise documented below in the visit note. 

## 2016-10-28 NOTE — Progress Notes (Signed)
Subjective:    Patient ID: Bridget Maldonado, female    DOB: June 18, 1981, 36 y.o.   MRN: [id number]  HPI Here for follow up and has sore throat Has toddler girl with her  Had recent surgery for endometriosis in abdominal wall Recent period   Now on MTX for psoriatic arthritis Feels her rash is worse now Recently decreased from 15-10mg  "It makes me feel like crap"-- and not really helping enough Will probably be changing Rx---sees Dr Meda [facility]) Takes tramadol prn for back pain still Has been getting massages---to see if that helps. 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 Freezing last night No SOB Asthma not flared--now on advair and allergy meds  Current Outpatient Prescriptions on File Prior to Visit  Medication Sig Dispense Refill  . albuterol (PROVENTIL HFA;VENTOLIN HFA) 108 (90 Base) MCG/ACT inhaler Inhale 2 puffs into the lungs every 6 (six) hours as needed for wheezing or shortness of breath. 1 Inhaler 1  . cetirizine (ZYRTEC) 10 MG tablet Take 10 mg by mouth daily.    . Fluticasone-Salmeterol (ADVAIR) 250-50 MCG/DOSE AEPB Inhale 1 puff into the lungs 2 (two) times daily. 60 each 5  . methotrexate (RHEUMATREX) 2.5 MG tablet Take 6 tabs once a week for 4 weeks    . traMADol (ULTRAM) 50 MG tablet Take 1 tablet (50 mg total) by mouth every 6 (six) hours as needed for moderate pain or severe pain. 30 tablet 0  . NIFEdipine (PROCARDIA-XL/ADALAT CC) 30 MG 24 hr tablet Take 1 tablet (30 mg total) by mouth daily. Reported on 11/07/2015 (Patient not taking: Reported on 10/28/2016) 30 tablet 2   No current facility-administered medications on file prior to visit.     Allergies  Allergen  Reactions  . Sulfa Antibiotics Nausea And Vomiting  . Ciprofloxacin Nausea And Vomiting  . Effexor [Venlafaxine Hcl] Other (See Comments)    Reaction:  Made pt sleep for two days straight.    Past Medical History:  Diagnosis Date  . Anxiety    Possible bipolar, Type II  . Asthma   . Bipolar disorder ([place])   . Chronic headaches   . Hemorrhoids 08/2007  . IBS (irritable bowel syndrome) 08/2007  . 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 Kidney infection   . Migraines   . Multiple allergies   . Obese   . Psoriasis   . Psoriatic arthritis [facility])     Past Surgical History:  Procedure Laterality Date  . CESAREAN SECTION    . COLONOSCOPY    . COLONOSCOPY W/ BIOPSIES    . EAR TUBE REMOVAL    . ESOPHAGOGASTRODUODENOSCOPY    . LAPAROSCOPIC CHOLECYSTECTOMY  6/09   Dr. Hulen Skains  . TYMPANOSTOMY TUBE PLACEMENT      Family History  Problem Relation Age of Onset  . Colon polyps Maternal Grandfather   . Parkinson's disease Maternal Grandfather   . Breast cancer Mother   . Rheum arthritis Mother   . Irritable bowel syndrome Mother   . Asthma Father   . Anxiety disorder Father     Severe, possible bipolar  . Irritable bowel syndrome Maternal Grandmother   . Diabetes  mat. great uncles  . Alzheimer's disease      Dad's side  . Colon cancer      mat GGF    Social History   Social History  . Marital status: Single    Spouse name: N/A  . Number of children: 1  . Years of education: N/A   Occupational History  . unemployed    Social History Main Topics  . Smoking status: Never Smoker  . Smokeless tobacco: Never Used  . Alcohol use No  . Drug use: No  . Sexual activity: Yes   Other Topics Concern  . Not on file   Social History Narrative  . No narrative on file   Review of Systems Sleeps poorly--chronic Appetite is not great Weight fairly stable    Objective:   Physical Exam  Constitutional: She appears well-nourished. No distress.  HENT:  No sig sinus  tenderness No inflammation in TMs Mild nasal inflammation Injected pharynx but no tonsillar enlargement or exudates  Neck: Normal range of motion. No thyromegaly present.  Pulmonary/Chest: Effort normal and breath sounds normal. No respiratory distress. She has no wheezes. She has no rales.  Lymphadenopathy:    She has no cervical adenopathy.  Skin:  Classic psoriatic plaques mostly in knees/elbows  Psychiatric: She has a normal mood and affect. Her behavior is normal.          Assessment & Plan:

## 2016-10-28 NOTE — Assessment & Plan Note (Signed)
Rapid strep negative Findings not suggestive either Discussed viral etiology and supportive care

## 2016-11-01 LAB — TOXASSURE SELECT 13 (MW), URINE

## 2016-11-06 ENCOUNTER — Encounter: Payer: Self-pay | Admitting: Internal Medicine

## 2016-11-06 MED ORDER — AMOXICILLIN 500 MG PO TABS: 1000.0000 mg | ORAL_TABLET | Freq: Two times a day (BID) | ORAL | 0 refills | Status: AC

## 2016-11-06 NOTE — Telephone Encounter (Signed)
Patient left message on voicemail requesting a call back regarding her mychart message.

## 2016-11-06 NOTE — Telephone Encounter (Signed)
Please let her know that I sent a prescription for an antibiotic 

## 2016-11-12 MED ORDER — PREDNISONE 20 MG PO TABS: ORAL_TABLET | ORAL | 0 refills | Status: DC

## 2016-11-12 NOTE — Telephone Encounter (Signed)
She has some asthma - in addition to cough meds I would add steroids to try to open up her breathing.  Prednisone 20 mg, 2 tabs po for 5 days, then 1 tab po for 5 days, #15, 0 ref

## 2016-11-25 ENCOUNTER — Other Ambulatory Visit: Payer: Self-pay | Admitting: Internal Medicine

## 2016-11-25 MED ORDER — TRAMADOL HCL 50 MG PO TABS: 50.0000 mg | ORAL_TABLET | Freq: Four times a day (QID) | ORAL | 0 refills | Status: DC | PRN

## 2016-11-25 NOTE — Telephone Encounter (Signed)
Approved: 30 x 0 

## 2016-11-25 NOTE — Telephone Encounter (Signed)
Left refill on voice mail at pharmacy  

## 2016-11-25 NOTE — Telephone Encounter (Signed)
Last filled 10-28-16 #30 Last Ov 10-28-16 Next OV 05-04-17  UDS 10-28-16

## 2016-12-22 ENCOUNTER — Other Ambulatory Visit: Payer: Self-pay | Admitting: Internal Medicine

## 2016-12-22 MED ORDER — TRAMADOL HCL 50 MG PO TABS: 50.0000 mg | ORAL_TABLET | Freq: Four times a day (QID) | ORAL | 0 refills | Status: DC | PRN

## 2016-12-22 NOTE — Telephone Encounter (Signed)
Approved: 30 x 0 

## 2016-12-22 NOTE — Telephone Encounter (Signed)
Last office visit 10/28/2016.  Last refilled 11/25/16 for #30 with no refills.  Ok to refill?

## 2016-12-22 NOTE — Telephone Encounter (Signed)
Left refill on voice mail at pharmacy  

## 2017-01-19 ENCOUNTER — Other Ambulatory Visit: Payer: Self-pay | Admitting: Internal Medicine

## 2017-01-19 MED ORDER — TRAMADOL HCL 50 MG PO TABS: 50.0000 mg | ORAL_TABLET | Freq: Four times a day (QID) | ORAL | 0 refills | Status: DC | PRN

## 2017-01-19 NOTE — Telephone Encounter (Signed)
Last filled 12-22-16 #30 Last OV Acute 10-28-16 Next OV 05-04-17

## 2017-01-19 NOTE — Telephone Encounter (Signed)
Left refill on voice mail at pharmacy  

## 2017-01-19 NOTE — Telephone Encounter (Signed)
Approved: 30 x 0 

## 2017-02-12 ENCOUNTER — Encounter: Payer: Self-pay | Admitting: Internal Medicine

## 2017-02-16 ENCOUNTER — Other Ambulatory Visit: Payer: Self-pay | Admitting: Internal Medicine

## 2017-02-16 MED ORDER — TRAMADOL HCL 50 MG PO TABS: 50.0000 mg | ORAL_TABLET | Freq: Four times a day (QID) | ORAL | 0 refills | Status: DC | PRN

## 2017-02-16 NOTE — Telephone Encounter (Signed)
Left refill on voice mail at pharmacy  

## 2017-02-16 NOTE — Telephone Encounter (Signed)
Last filled 01-19-17 #30 Last OV Acute 11-07-16 Next OV 05-04-17

## 2017-02-16 NOTE — Telephone Encounter (Signed)
Approved: 30 x 0 

## 2017-03-09 ENCOUNTER — Ambulatory Visit (INDEPENDENT_AMBULATORY_CARE_PROVIDER_SITE_OTHER): Payer: Medicaid Other | Admitting: Family Medicine

## 2017-03-09 ENCOUNTER — Encounter: Payer: Self-pay | Admitting: Family Medicine

## 2017-03-09 VITALS — BP 122/88 | HR 104 | Temp 98.5°F | Resp 12 | Wt 190.0 lb

## 2017-03-09 DIAGNOSIS — J4530 Mild persistent asthma, uncomplicated: Secondary | ICD-10-CM

## 2017-03-09 DIAGNOSIS — B9789 Other viral agents as the cause of diseases classified elsewhere: Secondary | ICD-10-CM | POA: Insufficient documentation

## 2017-03-09 DIAGNOSIS — J988 Other specified respiratory disorders: Secondary | ICD-10-CM

## 2017-03-09 DIAGNOSIS — L4050 Arthropathic psoriasis, unspecified: Secondary | ICD-10-CM | POA: Diagnosis not present

## 2017-03-09 LAB — POC INFLUENZA A&B (BINAX/QUICKVUE)
INFLUENZA A, POC: NEGATIVE
INFLUENZA B, POC: NEGATIVE

## 2017-03-09 MED ORDER — HYDROCOD POLST-CPM POLST ER 10-8 MG/5ML PO SUER: 5.0000 mL | Freq: Two times a day (BID) | ORAL | 0 refills | Status: DC | PRN

## 2017-03-09 NOTE — Assessment & Plan Note (Signed)
She is on MTX.

## 2017-03-09 NOTE — Assessment & Plan Note (Signed)
Encouraged continued advair and albuterol use.  Does not seem to be in acute flare at this time.  Close monitoring warranted.

## 2017-03-09 NOTE — Progress Notes (Signed)
BP 122/88 (BP Location: Left Arm, Patient Position: Sitting, Cuff Size: Large)   Pulse (!) 104   Temp 98.5 F (36.9 C) (Tympanic)   Resp 12   Wt 190 lb (86.2 kg)   SpO2 98%   BMI 32.61 kg/m    CC: URI? Subjective:    Patient ID: Bridget Maldonado, female    DOB: 08-10-1980, 36 y.o.   MRN: [id number]  HPI: Bridget Maldonado is a 36 y.o. female presenting on 03/09/2017 for URI (Woke up yesterday with a tickle in her throat. Throughout the day, symptoms got worse. Wheezing started. took a shower to help loosen up chest. Had to use inhaler for 1st time in several months.)   1d h/o tickle in back of throat that progressed to headache and coughing fits productive of mucous, wheezing. Marked fatigue. Sudden onset. Hoarse voice. Felt feverish. + PNDrainage. + ST.   Needed albuterol rescue inhaler for wheezing, and took hot shower which helped. Ibuprofen has helped too, last took 6am. Last albuterol course was at 6am.   No chills or documented fevers.  No sick contacts at home.  No smokers at home.  Tends to get bronchitis.   Known psoriatic arthritis and ileitis on methotrexate Known asthma managed with advair (takes one puff once daily) and rare albuterol.   Relevant past medical, surgical, family and social history reviewed and updated as indicated. Interim medical history since our last visit reviewed. Allergies and medications reviewed and updated. Outpatient Medications Prior to Visit  Medication Sig Dispense Refill  . albuterol (PROVENTIL HFA;VENTOLIN HFA) 108 (90 Base) MCG/ACT inhaler Inhale 2 puffs into the lungs every 6 (six) hours as needed for wheezing or shortness of breath. 1 Inhaler 1  . cetirizine (ZYRTEC) 10 MG tablet Take 10 mg by mouth daily.    . Fluticasone-Salmeterol (ADVAIR) 250-50 MCG/DOSE AEPB Inhale 1 puff into the lungs 2 (two) times daily. 60 each 5  . methotrexate (RHEUMATREX) 2.5 MG tablet Take 6 tabs once a week for 4 weeks    . NIFEdipine (PROCARDIA-XL/ADALAT  CC) 30 MG 24 hr tablet Take 1 tablet (30 mg total) by mouth daily. Reported on 11/07/2015 30 tablet 2  . traMADol (ULTRAM) 50 MG tablet Take 1 tablet (50 mg total) by mouth every 6 (six) hours as needed for moderate pain or severe pain. 30 tablet 0  . predniSONE (DELTASONE) 20 MG tablet Take 2 tablets daily for 5 days then 1 tablet daily for 5 days. 15 tablet 0   No facility-administered medications prior to visit.      Per HPI unless specifically indicated in ROS section below Review of Systems     Objective:    BP 122/88 (BP Location: Left Arm, Patient Position: Sitting, Cuff Size: Large)   Pulse (!) 104   Temp 98.5 F (36.9 C) (Tympanic)   Resp 12   Wt 190 lb (86.2 kg)   SpO2 98%   BMI 32.61 kg/m   Wt Readings from Last 3 Encounters:  03/09/17 190 lb (86.2 kg)  10/28/16 183 lb (83 kg)  09/08/16 181 lb (82.1 kg)    Physical Exam  Constitutional: She appears well-developed and well-nourished. No distress.  HENT:  Head: Normocephalic and atraumatic.  Right Ear: Hearing, tympanic membrane, external ear and ear canal normal.  Left Ear: Hearing, tympanic membrane, external ear and ear canal normal.  Nose: Rhinorrhea present. No mucosal edema. Right sinus exhibits no maxillary sinus tenderness and no frontal sinus tenderness. Left sinus  exhibits no maxillary sinus tenderness and no frontal sinus tenderness.  Mouth/Throat: Uvula is midline and mucous membranes are normal. Posterior oropharyngeal erythema present. No oropharyngeal exudate, posterior oropharyngeal edema or tonsillar abscesses.  TM scarring bilaterally  Eyes: Pupils are equal, round, and reactive to light. Conjunctivae and EOM are normal. No scleral icterus.  Neck: Normal range of motion. Neck supple.  Cardiovascular: Normal rate, regular rhythm, normal heart sounds and intact distal pulses.   No murmur heard. Pulmonary/Chest: Effort normal and breath sounds normal. No respiratory distress. She has no wheezes. She has  no rales.  Lungs sound clear today  Lymphadenopathy:    She has no cervical adenopathy.  Skin: Skin is warm and dry. No rash noted.  Nursing note and vitals reviewed.  Results for orders placed or performed in visit on 10/28/16  ToxASSURE Select 20 (MW), Urine  Result Value Ref Range   Summary FINAL       Assessment & Plan:   Problem List Items Addressed This Visit    Mild persistent asthma    Encouraged continued advair and albuterol use.  Does not seem to be in acute flare at this time.  Close monitoring warranted.       Psoriatic arthritis ([place])    She is on MTX.      Viral respiratory infection - Primary    Anticipate flu like illness. Flu swab negative today. Supportive care reviewed. Rx tussionex pennkinetic to control cough, has worked well in the past. Red flags to seek further care reviewed. Pt agrees with plan.           Follow up plan: Return if symptoms worsen or fail to improve.  Ria Bush, MD

## 2017-03-09 NOTE — Addendum Note (Signed)
Addended by: Brenton Grills on: 12/13/6331 54:56 PM   Modules accepted: Orders

## 2017-03-09 NOTE — Patient Instructions (Addendum)
Flu swab today negative. I think you have flu like illness or viral respiratory infection - treat supportively with fluids, rest, may continue ibuprofen for next few days.  We will treat with tussionex cough syrup - with sedation precautions.  Let us know if not improving with treatment.  Let us know if fever >101 or worsening productive cough.  Continue advair and albuterol inhaler every 6-8 hours scheduled for 3 days then as needed.

## 2017-03-09 NOTE — Assessment & Plan Note (Addendum)
Anticipate flu like illness. Flu swab negative today. Supportive care reviewed. Rx tussionex pennkinetic to control cough, has worked well in the past. Red flags to seek further care reviewed. Pt agrees with plan.

## 2017-03-10 ENCOUNTER — Telehealth: Payer: Self-pay

## 2017-03-10 NOTE — Telephone Encounter (Signed)
Pt left v/m; pt was seen 03/09/17 and needs note to return to work on 03/11/17. Pt request cb when note is ready for pick up.

## 2017-03-10 NOTE — Telephone Encounter (Signed)
Ok to write letter. Thanks Seen yesterday, out of work yesterday and today with return on 03/11/2017.

## 2017-03-12 NOTE — Telephone Encounter (Signed)
Left message on vm per dpr notifying pt work note is ready to pick up. Placed at front office.

## 2017-03-17 ENCOUNTER — Other Ambulatory Visit: Payer: Self-pay | Admitting: Internal Medicine

## 2017-03-17 NOTE — Telephone Encounter (Signed)
Last filled 02-16-17 #30 Last Ov Acute 03-09-17 Next Ov 05-04-17

## 2017-03-18 MED ORDER — TRAMADOL HCL 50 MG PO TABS: 50.0000 mg | ORAL_TABLET | Freq: Four times a day (QID) | ORAL | 0 refills | Status: DC | PRN

## 2017-03-18 NOTE — Telephone Encounter (Signed)
Approved: 30 x 0 

## 2017-03-18 NOTE — Telephone Encounter (Signed)
Left refill on voice mail at pharmacy  

## 2017-04-06 ENCOUNTER — Other Ambulatory Visit: Payer: Self-pay | Admitting: Internal Medicine

## 2017-04-06 ENCOUNTER — Encounter: Payer: Self-pay | Admitting: Internal Medicine

## 2017-04-06 NOTE — Telephone Encounter (Signed)
Last filled 03-18-17 #30. Last OV Acute 03-09-17 Next Ov 05-04-17.  Pt sent another MyChart message stating she is asking for it early because she is going out of town

## 2017-04-07 MED ORDER — TRAMADOL HCL 50 MG PO TABS: 50.0000 mg | ORAL_TABLET | Freq: Four times a day (QID) | ORAL | 0 refills | Status: DC | PRN

## 2017-04-07 NOTE — Telephone Encounter (Signed)
Approved: 30 x 0 

## 2017-04-07 NOTE — Telephone Encounter (Signed)
Left refill on voice mail at pharmacy  

## 2017-04-11 DIAGNOSIS — R1084 Generalized abdominal pain: Secondary | ICD-10-CM | POA: Insufficient documentation

## 2017-04-11 DIAGNOSIS — Z79899 Other long term (current) drug therapy: Secondary | ICD-10-CM | POA: Diagnosis not present

## 2017-04-11 DIAGNOSIS — I10 Essential (primary) hypertension: Secondary | ICD-10-CM | POA: Insufficient documentation

## 2017-04-11 DIAGNOSIS — R109 Unspecified abdominal pain: Secondary | ICD-10-CM | POA: Diagnosis present

## 2017-04-11 DIAGNOSIS — J45909 Unspecified asthma, uncomplicated: Secondary | ICD-10-CM | POA: Insufficient documentation

## 2017-04-11 LAB — COMPREHENSIVE METABOLIC PANEL
ALBUMIN: 3.9 g/dL (ref 3.5–5.0)
ALK PHOS: 92 U/L (ref 38–126)
ALT: 34 U/L (ref 14–54)
AST: 108 U/L — AB (ref 15–41)
Anion gap: 11 (ref 5–15)
BUN: 17 mg/dL (ref 6–20)
CALCIUM: 9.1 mg/dL (ref 8.9–10.3)
CHLORIDE: 107 mmol/L (ref 101–111)
CO2: 20 mmol/L — AB (ref 22–32)
CREATININE: 0.82 mg/dL (ref 0.44–1.00)
GFR calc Af Amer: >60 mL/min (ref >60)
GFR calc non Af Amer: >60 mL/min (ref >60)
GLUCOSE: 109 mg/dL — AB (ref 65–99)
Potassium: 3.6 mmol/L (ref 3.5–5.1)
SODIUM: 138 mmol/L (ref 135–145)
Total Bilirubin: 0.3 mg/dL (ref 0.3–1.2)
Total Protein: 7.1 g/dL (ref 6.5–8.1)

## 2017-04-11 LAB — URINALYSIS, COMPLETE (UACMP) WITH MICROSCOPIC
BACTERIA UA: NONE SEEN
Bilirubin Urine: NEGATIVE
GLUCOSE, UA: NEGATIVE mg/dL
Ketones, ur: NEGATIVE mg/dL
Leukocytes, UA: NEGATIVE
Nitrite: NEGATIVE
PROTEIN: NEGATIVE mg/dL
Specific Gravity, Urine: 1.025 (ref 1.005–1.030)
pH: 6 (ref 5.0–8.0)

## 2017-04-11 LAB — CBC
HCT: 43.2 % (ref 35.0–47.0)
Hemoglobin: 14.6 g/dL (ref 12.0–16.0)
MCH: 29 pg (ref 26.0–34.0)
MCHC: 33.9 g/dL (ref 32.0–36.0)
MCV: 85.7 fL (ref 80.0–100.0)
Platelets: 247 10*3/uL (ref 150–440)
RBC: 5.04 MIL/uL (ref 3.80–5.20)
RDW: 13.3 % (ref 11.5–14.5)
WBC: 7.9 10*3/uL (ref 3.6–11.0)

## 2017-04-11 LAB — POCT PREGNANCY, URINE: Preg Test, Ur: NEGATIVE

## 2017-04-11 LAB — LIPASE, BLOOD: LIPASE: 36 U/L (ref 11–51)

## 2017-04-11 NOTE — ED Triage Notes (Signed)
Patient reports symptoms began yesterday.  Reports bloating, skin across abdomen sensitive to any type of touch.  Reports pain at epigastric area.  States last BM was approximately 1 hour prior to arrival.

## 2017-04-12 ENCOUNTER — Emergency Department: Payer: Medicaid Other

## 2017-04-12 ENCOUNTER — Encounter: Payer: Self-pay | Admitting: Radiology

## 2017-04-12 ENCOUNTER — Emergency Department
Admission: EM | Admit: 2017-04-12 | Discharge: 2017-04-12 | Disposition: A | Discharge status: Home / Self Care | Payer: Medicaid Other | Attending: Emergency Medicine | Admitting: Emergency Medicine

## 2017-04-12 DIAGNOSIS — R1084 Generalized abdominal pain: Secondary | ICD-10-CM

## 2017-04-12 MED ORDER — IOPAMIDOL (ISOVUE-300) INJECTION 61%
100.0000 mL | Freq: Once | INTRAVENOUS | Status: AC | PRN
  Administered 2017-04-12: 100 mL via INTRAVENOUS

## 2017-04-12 MED ORDER — ONDANSETRON 4 MG PO TBDP: ORAL_TABLET | ORAL | 0 refills | Status: DC

## 2017-04-12 MED ORDER — SODIUM CHLORIDE 0.9 % IV BOLUS (SEPSIS)
1000.0000 mL | INTRAVENOUS | Status: AC
  Administered 2017-04-12: 1000 mL via INTRAVENOUS

## 2017-04-12 MED ORDER — MORPHINE SULFATE (PF) 4 MG/ML IV SOLN
4.0000 mg | Freq: Once | INTRAVENOUS | Status: AC
  Administered 2017-04-12: 4 mg via INTRAVENOUS
  Filled 2017-04-12: qty 1

## 2017-04-12 MED ORDER — ONDANSETRON HCL 4 MG/2ML IJ SOLN
4.0000 mg | INTRAMUSCULAR | Status: AC
  Administered 2017-04-12: 4 mg via INTRAVENOUS
  Filled 2017-04-12: qty 2

## 2017-04-12 MED ORDER — DICYCLOMINE HCL 10 MG PO CAPS
10.0000 mg | ORAL_CAPSULE | Freq: Once | ORAL | Status: AC
  Administered 2017-04-12: 10 mg via ORAL
  Filled 2017-04-12: qty 1

## 2017-04-12 MED ORDER — DICYCLOMINE HCL 10 MG PO CAPS: 10.0000 mg | ORAL_CAPSULE | Freq: Three times a day (TID) | ORAL | 0 refills | Status: DC | PRN

## 2017-04-12 NOTE — Discharge Instructions (Signed)
You have been seen in the Emergency Department (ED) for abdominal pain.  Your evaluation did not identify a clear cause of your symptoms but was generally reassuring.  Your CT scan was essentially normal; your lungs have some changes that are probably the result of your recent viral illness, but there is no sign of pneumonia.  Your old C-section scar seems to have gotten bigger with some extra tissue visible on the CT scan, but there is no sign of abdominal infection, obstruction, or other acute or emergent medical condition.  Please follow up as instructed above regarding today?s emergent visit and the symptoms that are bothering you.  Return to the ED if your abdominal pain worsens or fails to improve, you develop bloody vomiting, bloody diarrhea, you are unable to tolerate fluids due to vomiting, fever greater than 101, or other symptoms that concern you.

## 2017-04-12 NOTE — ED Notes (Signed)
Reviewed discharge instructions, follow-up care, prescriptions with patient. Patient verbalized understanding.

## 2017-04-12 NOTE — ED Provider Notes (Signed)
[facility] Emergency Department Provider Note  ____________________________________________   First MD Initiated Contact with Patient 04/12/17 0132     (approximate)  I have reviewed the triage vital signs and the nursing notes.   HISTORY  Chief Complaint Abdominal Pain    HPI Bridget Maldonado is a 36 y.o. female with medical history as listed below who presents for evaluation of gradually worsening abdominal pain and distention.  The symptoms started yesterday and have gradually gotten worse and are now severe.  She reports that she is significantly distended and her abdomen is tender to any type of touch.  She has had decreased appetite and when she does try to eat or drink anything she is very nauseated and has had one episode of vomiting.  The pain is mostly located in her upper abdomen but it does radiate throughout the abdomen.  Nothing makes it better.  It is worse with eating, drinking, deep breaths, lying flat, and palpation.  She had a normal bowel movement today and is passing gas.  She denies fever/chills, chest pain, shortness of breath, vaginal bleeding, and dysuria.  She has had 3 abdominal surgeries in the past, including a cholecystectomy, cesarean section, and tubal ligation.  Past Medical History:  Diagnosis Date  . Anxiety    Possible bipolar, Type II  . Asthma   . Bipolar disorder ([place])   . Chronic headaches   . Hemorrhoids 08/2007  . IBS (irritable bowel syndrome) 08/2007  . 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 Kidney infection   . Migraines   . Multiple allergies   . Obese   . Psoriasis   . Psoriatic arthritis [facility])     Patient Active Problem List   Diagnosis Date Noted  . Viral respiratory infection 03/09/2017  . Psoriatic arthritis ([place]) 10/28/2016  . Chronic low back pain 10/28/2016  . Essential (primary) hypertension 10/18/2014  . Arthritis 11/24/2013  . Arthropathia 11/24/2013  . Obstipation 04/12/2013  . Migraine with  status migrainosus 09/08/2012  . 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 Mild persistent asthma 05/26/2011  . Severe anxiety with panic 08/29/2006  . Panic disorder without agoraphobia 08/29/2006    Past Surgical History:  Procedure Laterality Date  . CESAREAN SECTION    . COLONOSCOPY    . COLONOSCOPY W/ BIOPSIES    . EAR TUBE REMOVAL    . ESOPHAGOGASTRODUODENOSCOPY    . LAPAROSCOPIC CHOLECYSTECTOMY  6/09   Dr. Hulen Skains  . TYMPANOSTOMY TUBE PLACEMENT      Prior to Admission medications   Medication Sig Start Date End Date Taking? Authorizing Provider  albuterol (PROVENTIL HFA;VENTOLIN HFA) 108 (90 Base) MCG/ACT inhaler Inhale 2 puffs into the lungs every 6 (six) hours as needed for wheezing or shortness of breath. 04/16/16   Venia Carbon, MD  cetirizine (ZYRTEC) 10 MG tablet Take 10 mg by mouth daily.    [provider]  chlorpheniramine-HYDROcodone (TUSSIONEX PENNKINETIC ER) 10-8 MG/5ML SUER Take 5 mLs by mouth 2 (two) times daily as needed for cough. 03/09/17   Ria Bush, MD  dicyclomine (BENTYL) 10 MG capsule Take 1 capsule (10 mg total) by mouth 3 (three) times daily as needed for spasms (abdominal discomfort). 04/12/17 05/12/17  Hinda Kehr, MD  Fluticasone-Salmeterol (ADVAIR) 250-50 MCG/DOSE AEPB Inhale 1 puff into the lungs 2 (two) times daily. 05/12/16   Venia Carbon, MD  methotrexate (RHEUMATREX) 2.5 MG tablet Take 6 tabs once a week for 4 weeks 07/15/16  [provider]  NIFEdipine (PROCARDIA-XL/ADALAT CC) 30 MG 24 hr tablet Take 1 tablet (30 mg total) by mouth daily. Reported on 11/07/2015 04/16/16   Venia Carbon, MD  ondansetron (ZOFRAN ODT) 4 MG disintegrating tablet Allow 1-2 tablets to dissolve in your mouth every 8 hours as needed for nausea/vomiting 04/12/17   Hinda Kehr, MD  traMADol (ULTRAM) 50 MG tablet Take 1 tablet (50 mg total) by mouth every 6 (six) hours as needed for moderate pain or severe pain.  04/07/17 04/07/18  Venia Carbon, MD    Allergies Sulfa antibiotics; Ciprofloxacin; and Effexor [venlafaxine hcl]  Family History  Problem Relation Age of Onset  . Colon polyps Maternal Grandfather   . Parkinson's disease Maternal Grandfather   . Breast cancer Mother   . Rheum arthritis Mother   . Irritable bowel syndrome Mother   . Asthma Father   . Anxiety disorder Father        Severe, possible bipolar  . Irritable bowel syndrome Maternal Grandmother   . Diabetes Unknown        mat. great uncles  . Alzheimer's disease Unknown        Dad's side  . Colon cancer Unknown        mat GGF    Social History Social History  Substance Use Topics  . Smoking status: Never Smoker  . Smokeless tobacco: Never Used  . Alcohol use No    Review of Systems Constitutional: No fever/chills Eyes: No visual changes. ENT: No sore throat. Cardiovascular: Denies chest pain. Respiratory: Denies shortness of breath. Gastrointestinal: Vaginally worsening abdominal pain and distention since yesterday.  No bowel changes.  One episode of vomiting and nausea whenever she eats or drinks Genitourinary: Negative for dysuria. Musculoskeletal: Negative for neck pain.  Negative for back pain. Integumentary: Negative for rash. Neurological: Negative for headaches, focal weakness or numbness.   ____________________________________________   PHYSICAL EXAM:  VITAL SIGNS: ED Triage Vitals  Enc Vitals Group     BP 04/11/17 2226 (!) 180/105     Pulse Rate 04/11/17 2226 (!) 109     Resp 04/11/17 2226 20     Temp 04/11/17 2226 99.4 F (37.4 C)     Temp Source 04/11/17 2226 Oral     SpO2 04/11/17 2226 98 %     Weight 04/11/17 2226 83.9 kg (185 lb)     Height 04/11/17 2226 1.626 m (5\' 4" )     Head Circumference --      Peak Flow --      Pain Score 04/11/17 2224 8     Pain Loc --      Pain Edu? --      Excl. in [place]? --     Constitutional: Alert and oriented. Well appearing and in no acute  distress. Eyes: Conjunctivae are normal.  Head: Atraumatic. Cardiovascular: Normal rate, regular rhythm. Good peripheral circulation. Grossly normal heart sounds. Respiratory: Normal respiratory effort.  No retractions. Lungs CTAB. Gastrointestinal: Abdomen does appear distended and is generally tender to palpation throughout but is more tender in the upper abdomen in the epigastrium.  No rebound tenderness.  The abdomen feels tense due to the distention but not hard or firm.  No peritonitis Musculoskeletal: No lower extremity tenderness nor edema. No gross deformities of extremities. Neurologic:  Normal speech and language. No gross focal neurologic deficits are appreciated.  Skin:  Skin is warm, dry and intact. No rash noted. Psychiatric: Mood and affect are normal. Speech and behavior  are normal.  ____________________________________________   LABS (all labs ordered are listed, but only abnormal results are displayed)  Labs Reviewed  COMPREHENSIVE METABOLIC PANEL - Abnormal; Notable for the following:       Result Value   CO2 20 (*)    Glucose, Bld 109 (*)    AST 108 (*)    All other components within normal limits  URINALYSIS, COMPLETE (UACMP) WITH MICROSCOPIC - Abnormal; Notable for the following:    Color, Urine YELLOW (*)    APPearance CLEAR (*)    Hgb urine dipstick SMALL (*)    Squamous Epithelial / LPF 0-5 (*)    All other components within normal limits  LIPASE, BLOOD  CBC  POC URINE PREG, ED  POCT PREGNANCY, URINE   ____________________________________________  EKG  None - EKG not ordered by ED physician ____________________________________________  RADIOLOGY   Ct Abdomen Pelvis W Contrast  Result Date: 04/12/2017 CLINICAL DATA:  Epigastric pain and skin sensitivity across the abdomen. Bloating. Symptoms began yesterday. History of Cesarean section, cholecystectomy, surgery for endometriosis and tubal ligations. EXAM: CT ABDOMEN AND PELVIS WITH CONTRAST  TECHNIQUE: Multidetector CT imaging of the abdomen and pelvis was performed using the standard protocol following bolus administration of intravenous contrast. CONTRAST:  142mL ISOVUE-300 IOPAMIDOL (ISOVUE-300) INJECTION 61% COMPARISON:  None. FINDINGS: Lower chest: Faint ground-glass opacities in the right middle lobe, lingula left lower lobe. Alveolitis, pneumonitis, hypoventilatory or stigmata of mild pulmonary edema might account for some of this appearance. Findings are nonspecific. No effusion or pneumothorax. Heart is top-normal in size. No pericardial effusion. Hepatobiliary: Cholecystectomy. No space-occupying mass of the liver. There is no biliary dilatation. Pancreas: Normal pancreas.  No ductal dilatation or mass. Spleen: Normal in size without focal abnormality. Adrenals/Urinary Tract: Adrenal glands are unremarkable. Kidneys are normal, without renal calculi, focal lesion, or hydronephrosis. Bladder is unremarkable. Stomach/Bowel: Stomach is within normal limits. Appendix appears normal. No evidence of bowel wall thickening, distention, or inflammatory changes. Vascular/Lymphatic: No significant vascular findings are present. No enlarged abdominal or pelvic lymph nodes. Reproductive: Uterus and bilateral adnexa are unremarkable. Other: Subcutaneous nodule overlying the anterior right rectus muscle is slightly increased in size since prior currently estimated at 19 mm in diameter versus 13 mm previously. As it appears to represent site of prior Caesarean section scar, keloid, granulation tissue or fibroma might account for this appearance among some possibilities though not exclusive. Findings of nonspecific. Other differentials might include a focus of infection, inflammation or remotely neoplastic. Small fat containing umbilical hernia. Musculoskeletal: No acute or significant osseous findings. IMPRESSION: 1. No bowel obstruction or acute inflammation is identified. 2. Faint ground-glass opacities at  the lung bases, nonspecific but may reflect areas of alveolitis or pneumonitis, hypoventilatory change or possibly stigmata of pulmonary edema among some considerations. 3. Slightly larger subcutaneous nodule overlying the inferior right rectus muscle possibly representing an area granulation tissue as it appears to be at the site of expected Caesarean section scarring. Other thoughts might include a subcutaneous keloid or fibroma, postinflammatory nodule, postinfectious change or remotely neoplastic nodule. Electronically Signed   By: Ashley Royalty M.D.   On: 04/12/2017 02:31    ____________________________________________   PROCEDURES  Critical Care performed: No   Procedure(s) performed:   Procedures   ____________________________________________   INITIAL IMPRESSION / ASSESSMENT AND PLAN / ED COURSE  As part of my medical decision making, I reviewed the following data within the [place] notes reviewed and incorporated and Labs reviewed  The patient is generally well-appearing but does appear uncomfortable.  Her abdomen also does appear to be distended as she describes. Differential diagnosis includes, but is not limited to, ovarian cyst, ovarian torsion, acute appendicitis, diverticulitis, urinary tract infection/pyelonephritis, endometriosis, bowel obstruction, colitis, renal colic, gastroenteritis, hernia, pregnancy related pain including ectopic pregnancy, etc. however, her pregnancy test is negative and she has had a tubal ligation.  Her pain seems to be mostly in the upper abdomen.  Most likely diagnosis at this point is partial bowel obstruction.  We will evaluate with a CT scan (IV contrast only).  Patient agrees with the plan.   Clinical Course as of Apr 12 417  Sun Apr 12, 2017  0416 CT scan was generally reassuring with no evidence of an acute or emergent intra-abdominal condition.  Her C-section scar seems to have gotten bigger and I  mentioned this on the discharge paperwork at the recommendation of the radiologist.  I did call and speak with the radiologist about the nonspecific findings in her lungs.  He did not feel this is likely to represent pneumonia, especially given the clinical picture.  I explained that she told me that she had a viral infection a couple weeks ago and he said that is likely what is happening, or she could have been exhaling rather than inhaling at the time of the scan.  Either way, clinically she has no respiratory abnormalities and I do not feel she would benefit from antibiotics.She was sleeping and in no apparent discomfort when I checked on her.  I explained the results of the CT scan and encouraged her to follow-up as an outpatient, but explained there was no indication for any further intervention at this time.  She was comfortable with that plan.  Her symptoms may represent irritable bowel syndrome, and I prescribed some Bentyl which may be beneficial for her.  I also gave her a prescription for Zofran although she states that she has some Phenergan at home.  She will follow-up with her PCP or possibly with GI.  I gave my usual and customary return precautions  [CF]    Clinical Course User Index [CF] Hinda Kehr, MD    ____________________________________________  FINAL CLINICAL IMPRESSION(S) / ED DIAGNOSES  Final diagnoses:  Generalized abdominal pain     MEDICATIONS GIVEN DURING THIS VISIT:  Medications  dicyclomine (BENTYL) capsule 10 mg (not administered)  morphine 4 MG/ML injection 4 mg (4 mg Intravenous Given 04/12/17 0149)  ondansetron (ZOFRAN) injection 4 mg (4 mg Intravenous Given 04/12/17 0149)  sodium chloride 0.9 % bolus 1,000 mL (1,000 mLs Intravenous New Bag/Given 04/12/17 0149)  iopamidol (ISOVUE-300) 61 % injection 100 mL (100 mLs Intravenous Contrast Given 04/12/17 0205)     NEW OUTPATIENT MEDICATIONS STARTED DURING THIS VISIT:  New Prescriptions   DICYCLOMINE  (BENTYL) 10 MG CAPSULE    Take 1 capsule (10 mg total) by mouth 3 (three) times daily as needed for spasms (abdominal discomfort).   ONDANSETRON (ZOFRAN ODT) 4 MG DISINTEGRATING TABLET    Allow 1-2 tablets to dissolve in your mouth every 8 hours as needed for nausea/vomiting    Modified Medications   No medications on file    Discontinued Medications   No medications on file     Note:  This document was prepared using Dragon voice recognition software and may include unintentional dictation errors.    Hinda Kehr, MD 04/12/17 [phone]

## 2017-04-17 ENCOUNTER — Telehealth: Payer: Self-pay

## 2017-04-17 NOTE — Telephone Encounter (Signed)
Spoke to pt about recent ER visit for abd pain. She said she is still having some abd pain and bloating intermittently. Thinks it may be IBS flare-up.

## 2017-05-04 ENCOUNTER — Encounter: Payer: Self-pay | Admitting: Internal Medicine

## 2017-05-04 ENCOUNTER — Ambulatory Visit: Payer: Medicaid Other | Admitting: Internal Medicine

## 2017-05-04 VITALS — BP 126/90 | HR 87 | Temp 98.3°F | Wt 197.0 lb

## 2017-05-04 DIAGNOSIS — F410 Panic disorder [episodic paroxysmal anxiety] without agoraphobia: Secondary | ICD-10-CM | POA: Diagnosis not present

## 2017-05-04 DIAGNOSIS — L4050 Arthropathic psoriasis, unspecified: Secondary | ICD-10-CM | POA: Diagnosis not present

## 2017-05-04 MED ORDER — DULOXETINE HCL 30 MG PO CPEP: 30.0000 mg | ORAL_CAPSULE | Freq: Every day | ORAL | 3 refills | Status: DC

## 2017-05-04 MED ORDER — TRAMADOL HCL 50 MG PO TABS: 50.0000 mg | ORAL_TABLET | Freq: Four times a day (QID) | ORAL | 0 refills | Status: DC | PRN

## 2017-05-04 MED ORDER — ALPRAZOLAM 0.25 MG PO TABS: 0.2500 mg | ORAL_TABLET | Freq: Two times a day (BID) | ORAL | 0 refills | Status: DC | PRN

## 2017-05-04 NOTE — Assessment & Plan Note (Addendum)
Anxiety is worse but not as much panic Alprazolam some help but high dependence risk Concern for bipolar disorder as well Will try duloxetine at low dose Small # of alprazolam--emergency only  If doesn't tolerate duloxetine--will probably try mood stabilizer (like depakote)

## 2017-05-04 NOTE — Progress Notes (Signed)
Subjective:    Patient ID: Bridget Maldonado, female    DOB: 1981-02-01, 36 y.o.   MRN: [id number]  HPI Here for follow up --mostly anxiety and pain  "My IBS has been rough on me" Got bentyl and it really helped Using it as needed now--- only needed 2-3 times since ER when attacks seem to be coming on No clear food trigger  Having a lot of stress Bad flare of her psoriasis Working with Dr Meda Coffee to find something other than MTX--- makes her feel bad Considering enbrel (awaiting insurance from new job)  Picking her skin constantly--doesn't even realize it Not sleeping now--can feel her heart pounding Very anxious New job since July  Didn't find the seroquel helpful--just sedating Xanax helps but needs a lot  Some depression--but "I have no reason to be unhappy) Doesn't remember the trazodone helping any Didn't like prozac Took effexor--didn't do well  Current Outpatient Medications on File Prior to Visit  Medication Sig Dispense Refill  . albuterol (PROVENTIL HFA;VENTOLIN HFA) 108 (90 Base) MCG/ACT inhaler Inhale 2 puffs into the lungs every 6 (six) hours as needed for wheezing or shortness of breath. 1 Inhaler 1  . cetirizine (ZYRTEC) 10 MG tablet Take 10 mg by mouth daily.    [place] dicyclomine (BENTYL) 10 MG capsule Take 1 capsule (10 mg total) by mouth 3 (three) times daily as needed for spasms (abdominal discomfort). 30 capsule 0  . Fluticasone-Salmeterol (ADVAIR) 250-50 MCG/DOSE AEPB Inhale 1 puff into the lungs 2 (two) times daily. 60 each 5  . traMADol (ULTRAM) 50 MG tablet Take 1 tablet (50 mg total) by mouth every 6 (six) hours as needed for moderate pain or severe pain. 30 tablet 0  . methotrexate (RHEUMATREX) 2.5 MG tablet Take 6 tabs once a week for 4 weeks    . NIFEdipine (PROCARDIA-XL/ADALAT CC) 30 MG 24 hr tablet Take 1 tablet (30 mg total) by mouth daily. Reported on 11/07/2015 (Patient not taking: Reported on 05/04/2017) 30 tablet 2   No current facility-administered  medications on file prior to visit.     Allergies  Allergen Reactions  . Sulfa Antibiotics Nausea And Vomiting  . Ciprofloxacin Nausea And Vomiting  . Effexor [Venlafaxine Hcl] Other (See Comments)    Reaction:  Made pt sleep for two days straight.    Past Medical History:  Diagnosis Date  . Anxiety    Possible bipolar, Type II  . Asthma   . Bipolar disorder ([place])   . Chronic headaches   . Hemorrhoids 08/2007  . IBS (irritable bowel syndrome) 08/2007  . 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 Kidney infection   . Migraines   . Multiple allergies   . Obese   . Psoriasis   . Psoriatic arthritis [facility])     Past Surgical History:  Procedure Laterality Date  . CESAREAN SECTION    . COLONOSCOPY    . COLONOSCOPY W/ BIOPSIES    . EAR TUBE REMOVAL    . ESOPHAGOGASTRODUODENOSCOPY    . LAPAROSCOPIC CHOLECYSTECTOMY  6/09   Dr. Hulen Skains  . TYMPANOSTOMY TUBE PLACEMENT      Family History  Problem Relation Age of Onset  . Colon polyps Maternal Grandfather   . Parkinson's disease Maternal Grandfather   . Breast cancer Mother   . Rheum arthritis Mother   . Irritable bowel syndrome Mother   . Asthma Father   . Anxiety disorder Father        Severe, possible bipolar  .  Irritable bowel syndrome Maternal Grandmother   . Diabetes Unknown        mat. great uncles  . Alzheimer's disease Unknown        Dad's side  . Colon cancer Unknown        mat GGF    Social History   Socioeconomic History  . Marital status: Single    Spouse name: Not on file  . Number of children: 1  . Years of education: Not on file  . Highest education level: Not on file  Social Needs  . Financial resource strain: Not on file  . Food insecurity - worry: Not on file  . Food insecurity - inability: Not on file  . Transportation needs - medical: Not on file  . Transportation needs - non-medical: Not on file  Occupational History  . 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 Smoking status: Never Smoker  . Smokeless tobacco: Never Used  Substance and Sexual Activity  . Alcohol use: No    Alcohol/week: 0.0 oz  . Drug use: No  . Sexual activity: Yes  Other Topics Concern  . Not on file  Social History Narrative  . Not on file   Review of Systems Appetite is highly variable---no normal schedule Weight up and down Takes tramadol more due to not having the methotrexate    Objective:   Physical Exam  Constitutional: No distress.  Psychiatric:  Not clearly depressed Somewhat pressured speech but normal appearance Normal train of thought, etc          Assessment & Plan:

## 2017-05-04 NOTE — Assessment & Plan Note (Signed)
Refill the tramadol Hoping to start enbrel

## 2017-06-02 ENCOUNTER — Other Ambulatory Visit: Payer: Self-pay | Admitting: Internal Medicine

## 2017-06-02 ENCOUNTER — Encounter: Payer: Self-pay | Admitting: Internal Medicine

## 2017-06-03 ENCOUNTER — Encounter: Payer: Self-pay | Admitting: Internal Medicine

## 2017-06-03 ENCOUNTER — Ambulatory Visit: Payer: Medicaid Other | Admitting: Internal Medicine

## 2017-06-03 VITALS — BP 124/92 | HR 110 | Temp 98.2°F | Wt 196.5 lb

## 2017-06-03 DIAGNOSIS — L4050 Arthropathic psoriasis, unspecified: Secondary | ICD-10-CM

## 2017-06-03 DIAGNOSIS — Z23 Encounter for immunization: Secondary | ICD-10-CM

## 2017-06-03 DIAGNOSIS — F410 Panic disorder [episodic paroxysmal anxiety] without agoraphobia: Secondary | ICD-10-CM

## 2017-06-03 MED ORDER — MOMETASONE FUROATE 0.1 % EX OINT: TOPICAL_OINTMENT | CUTANEOUS | 1 refills | Status: DC

## 2017-06-03 MED ORDER — TRAMADOL HCL 50 MG PO TABS: 50.0000 mg | ORAL_TABLET | Freq: Four times a day (QID) | ORAL | 0 refills | Status: DC | PRN

## 2017-06-03 NOTE — Assessment & Plan Note (Signed)
Still in flare Unhappy with MTX Wants to see a different rheumatologist---will try to get her in in [place]

## 2017-06-03 NOTE — Progress Notes (Signed)
Subjective:    Patient ID: Bridget Maldonado, female    DOB: 06/20/1981, 36 y.o.   MRN: [id number]  HPI Here for follow up of anxiety  "I feel so much better" Within a week of starting---"I don't get angry...ibuprofen feel 100% better" Anxiety is mostly gone Still gets stressed but much better Is better at stepping back and controlling her anger---this is much better  Has the alprazolam just in case Comforting knowing that she has it Hasn't used up the #20 since a month ago Job is going great  Current Outpatient Medications on File Prior to Visit  Medication Sig Dispense Refill  . albuterol (PROVENTIL HFA;VENTOLIN HFA) 108 (90 Base) MCG/ACT inhaler Inhale 2 puffs into the lungs every 6 (six) hours as needed for wheezing or shortness of breath. 1 Inhaler 1  . ALPRAZolam (XANAX) 0.25 MG tablet Take 1 tablet (0.25 mg total) 2 (two) times daily as needed by mouth for anxiety. 20 tablet 0  . cetirizine (ZYRTEC) 10 MG tablet Take 10 mg by mouth daily.    . DULoxetine (CYMBALTA) 30 MG capsule Take 1 capsule (30 mg total) daily by mouth. 30 capsule 3  . Fluticasone-Salmeterol (ADVAIR) 250-50 MCG/DOSE AEPB Inhale 1 puff into the lungs 2 (two) times daily. 60 each 5  . methotrexate (RHEUMATREX) 2.5 MG tablet Take 6 tabs once a week for 4 weeks    . NIFEdipine (PROCARDIA-XL/ADALAT CC) 30 MG 24 hr tablet Take 1 tablet (30 mg total) by mouth daily. Reported on 11/07/2015 30 tablet 2  . traMADol (ULTRAM) 50 MG tablet Take 1 tablet (50 mg total) every 6 (six) hours as needed by mouth for moderate pain or severe pain. 30 tablet 0  . dicyclomine (BENTYL) 10 MG capsule Take 1 capsule (10 mg total) by mouth 3 (three) times daily as needed for spasms (abdominal discomfort). 30 capsule 0   No current facility-administered medications on file prior to visit.     Allergies  Allergen Reactions  . Sulfa Antibiotics Nausea And Vomiting  . Ciprofloxacin Nausea And Vomiting  . Effexor [Venlafaxine Hcl] Other  (See Comments)    Reaction:  Made pt sleep for two days straight.    Past Medical History:  Diagnosis Date  . Anxiety    Possible bipolar, Type II  . Asthma   . Bipolar disorder ([place])   . Chronic headaches   . Hemorrhoids 08/2007  . IBS (irritable bowel syndrome) 08/2007  . 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 Kidney infection   . Migraines   . Multiple allergies   . Obese   . Psoriasis   . Psoriatic arthritis [facility])     Past Surgical History:  Procedure Laterality Date  . CESAREAN SECTION    . COLONOSCOPY    . COLONOSCOPY W/ BIOPSIES    . EAR TUBE REMOVAL    . ESOPHAGOGASTRODUODENOSCOPY    . LAPAROSCOPIC CHOLECYSTECTOMY  6/09   Dr. Hulen Skains  . TYMPANOSTOMY TUBE PLACEMENT      Family History  Problem Relation Age of Onset  . Colon polyps Maternal Grandfather   . Parkinson's disease Maternal Grandfather   . Breast cancer Mother   . Rheum arthritis Mother   . Irritable bowel syndrome Mother   . Asthma Father   . Anxiety disorder Father        Severe, possible bipolar  . Irritable bowel syndrome Maternal Grandmother   . Diabetes Unknown        mat. great uncles  . Alzheimer's  disease Unknown        Dad's side  . Colon cancer Unknown        mat GGF    Social History   Socioeconomic History  . Marital status: Single    Spouse name: Not on file  . Number of children: 1  . Years of education: Not on file  . Highest education level: Not on file  Social Needs  . Financial resource strain: Not on file  . Food insecurity - worry: Not on file  . Food insecurity - inability: Not on file  . Transportation needs - medical: Not on file  . Transportation needs - non-medical: Not on file  Occupational History  . 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 Smoking status: Never Smoker  . Smokeless tobacco: Never Used  Substance and Sexual Activity  . Alcohol use: No    Alcohol/week: 0.0 oz  . Drug use: No  . Sexual activity:  Yes  Other Topics Concern  . Not on file  Social History Narrative  . Not on file   Review of Systems Still not sleeping great--relates to arthritic pain Switching doctors-- Dr Meda Coffee just wants her on the MTX and it makes her feel bad Psoriatic skin flare also    Objective:   Physical Exam  Constitutional: No distress.  Skin:  Widespread psoriatic rash  Psychiatric:  Upbeat No depression Anxiety is not evident          Assessment & Plan:

## 2017-06-03 NOTE — Assessment & Plan Note (Signed)
Marked improvement with the duloxetine Discussed--if she worsens, will increase to the 60mg  dose

## 2017-06-03 NOTE — Addendum Note (Signed)
Addended by: Modena Nunnery on: 06/03/2017 12:54 PM   Modules accepted: Orders

## 2017-06-19 ENCOUNTER — Encounter: Payer: Self-pay | Admitting: Internal Medicine

## 2017-06-19 ENCOUNTER — Ambulatory Visit: Payer: Medicaid Other | Admitting: Internal Medicine

## 2017-06-19 VITALS — BP 128/84 | HR 107 | Temp 98.7°F | Wt 196.0 lb

## 2017-06-19 DIAGNOSIS — B9789 Other viral agents as the cause of diseases classified elsewhere: Secondary | ICD-10-CM | POA: Diagnosis not present

## 2017-06-19 DIAGNOSIS — J069 Acute upper respiratory infection, unspecified: Secondary | ICD-10-CM

## 2017-06-19 MED ORDER — METHYLPREDNISOLONE ACETATE 80 MG/ML IJ SUSP
80.0000 mg | Freq: Once | INTRAMUSCULAR | Status: AC
  Administered 2017-06-19: 80 mg via INTRAMUSCULAR

## 2017-06-19 MED ORDER — HYDROCODONE-HOMATROPINE 5-1.5 MG/5ML PO SYRP: 5.0000 mL | ORAL_SOLUTION | Freq: Three times a day (TID) | ORAL | 0 refills | Status: DC | PRN

## 2017-06-19 NOTE — Patient Instructions (Signed)

## 2017-06-19 NOTE — Addendum Note (Signed)
Addended by: Lurlean Nanny on: 06/19/2017 04:59 PM   Modules accepted: Orders

## 2017-06-19 NOTE — Progress Notes (Signed)
HPI  Pt presents to the clinic today with c/o headache, sore throat and cough. This started 3 days ago. She denies difficulty swallowing. The cough is productive of yellow/brown mucous. She denies fever, chills or body aches. She has been nauseated but denies vomiting, diarrhea or constipation. She has tried Copywriter, advertising with minimal relief. She has a history of asthma. She has not had sick contacts. She was seen 11/20 for similar symptoms, treated with Prednisone, Doxycycline and Hycodan.  Review of Systems      Past Medical History:  Diagnosis Date  . Anxiety    Possible bipolar, Type II  . Asthma   . Bipolar disorder ([place])   . Chronic headaches   . Hemorrhoids 08/2007  . IBS (irritable bowel syndrome) 08/2007  . 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 Kidney infection   . Migraines   . Multiple allergies   . Obese   . Psoriasis   . Psoriatic arthritis ([place])     Family History  Problem Relation Age of Onset  . Colon polyps Maternal Grandfather   . Parkinson's disease Maternal Grandfather   . Breast cancer Mother   . Rheum arthritis Mother   . Irritable bowel syndrome Mother   . Asthma Father   . Anxiety disorder Father        Severe, possible bipolar  . Irritable bowel syndrome Maternal Grandmother   . Diabetes Unknown        mat. great uncles  . Alzheimer's disease Unknown        Dad's side  . Colon cancer Unknown        mat GGF    Social History   Socioeconomic History  . Marital status: Single    Spouse name: Not on file  . Number of children: 1  . Years of education: Not on file  . Highest education level: Not on file  Social Needs  . Financial resource strain: Not on file  . Food insecurity - worry: Not on file  . Food insecurity - inability: Not on file  . Transportation needs - medical: Not on file  . Transportation needs - non-medical: Not on file  Occupational History  . 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 Smoking status: Never Smoker  . Smokeless tobacco: Never Used  Substance and Sexual Activity  . Alcohol use: No    Alcohol/week: 0.0 oz  . Drug use: No  . Sexual activity: Yes  Other Topics Concern  . Not on file  Social History Narrative  . Not on file    Allergies  Allergen Reactions  . Sulfa Antibiotics Nausea And Vomiting  . Ciprofloxacin Nausea And Vomiting  . Effexor [Venlafaxine Hcl] Other (See Comments)    Reaction:  Made pt sleep for two days straight.     Constitutional: Positive headache. Denies fatigue, fever, abrupt weight changes.  HEENT:  Positive sore throat. Denies eye redness, eye pain, pressure behind the eyes, facial pain, nasal congestion, ear pain, ringing in the ears, wax buildup, runny nose or bloody nose. Respiratory: Positive cough. Denies difficulty breathing or shortness of breath.  Cardiovascular: Denies chest pain, chest tightness, palpitations or swelling in the hands or feet.   No other specific complaints in a complete review of systems (except as listed in HPI above).  Objective:   BP 128/84   Pulse (!) 107   Temp 98.7 F (37.1 C) (Oral)   Wt 196 lb (88.9 kg)  SpO2 98%   BMI 33.64 kg/m   Wt Readings from Last 3 Encounters:  06/19/17 196 lb (88.9 kg)  06/03/17 196 lb 8 oz (89.1 kg)  05/04/17 197 lb (89.4 kg)     General: Appears her stated age, in NAD. HEENT: Head: normal shape and size, no sinus tenderness noted;  Ears: Tm's gray and intact, normal light reflex, scarring noted on TM; Nose: mucosa pink and moist, septum midline; Throat/Mouth: + PND. Teeth present, mucosa erythematous and moist, no exudate noted, no lesions or ulcerations noted.  Neck: No cervical lymphadenopathy.   Pulmonary/Chest: Normal effort and positive vesicular breath sounds. No respiratory distress. No wheezes, rales or ronchi noted.       Assessment & Plan:   Viral Upper Respiratory Infection with Cough:  Get some rest and drink plenty of  water Do salt water gargles for the sore throat 80 mg Depo IM Start Zyrtec and Flonase OTC Rx for Hycodan cough syrup  RTC as needed or if symptoms persist.   Webb Silversmith, NP

## 2017-06-29 ENCOUNTER — Other Ambulatory Visit: Payer: Self-pay | Admitting: Internal Medicine

## 2017-06-29 MED ORDER — TRAMADOL HCL 50 MG PO TABS: 50.0000 mg | ORAL_TABLET | Freq: Four times a day (QID) | ORAL | 0 refills | Status: DC | PRN

## 2017-06-29 NOTE — Telephone Encounter (Signed)
Left refill on voice mail at pharmacy  

## 2017-06-29 NOTE — Telephone Encounter (Signed)
Last filled 06-03-17 #30 Last OV 06-03-17 Next OV 12-02-17

## 2017-06-29 NOTE — Telephone Encounter (Signed)
Approved: 30 x 0 

## 2017-07-16 ENCOUNTER — Ambulatory Visit: Payer: Self-pay | Admitting: Obstetrics & Gynecology

## 2017-07-16 ENCOUNTER — Ambulatory Visit (INDEPENDENT_AMBULATORY_CARE_PROVIDER_SITE_OTHER): Payer: Medicaid Other | Admitting: Obstetrics & Gynecology

## 2017-07-16 ENCOUNTER — Encounter: Payer: Self-pay | Admitting: Obstetrics & Gynecology

## 2017-07-16 VITALS — BP 120/80 | HR 83 | Ht 64.0 in | Wt 198.0 lb

## 2017-07-16 DIAGNOSIS — Z0000 Encounter for general adult medical examination without abnormal findings: Secondary | ICD-10-CM | POA: Diagnosis not present

## 2017-07-16 DIAGNOSIS — N914 Secondary oligomenorrhea: Secondary | ICD-10-CM | POA: Insufficient documentation

## 2017-07-16 DIAGNOSIS — Z87410 Personal history of cervical dysplasia: Secondary | ICD-10-CM | POA: Insufficient documentation

## 2017-07-16 DIAGNOSIS — Z803 Family history of malignant neoplasm of breast: Secondary | ICD-10-CM | POA: Diagnosis not present

## 2017-07-16 DIAGNOSIS — Z8742 Personal history of other diseases of the female genital tract: Secondary | ICD-10-CM | POA: Diagnosis not present

## 2017-07-16 HISTORY — DX: Secondary oligomenorrhea: N91.4

## 2017-07-16 NOTE — Progress Notes (Deleted)
HPI:      [name] is a 37 y.o. G2P0010 who LMP was No LMP recorded. Patient is not currently having periods (Reason: Irregular Periods)., she presents today for her annual examination. The patient has no complaints today. The patient {sys sexually active:13135} sexually active. Her last pap: approximate date 2016 and was abnormal: ASCUS and has h/o dysplasia and colposcopies done prior to that time and last mammogram: 2017, has FH breast cancer and so has had MMG in past (normal). The patient does perform self breast exams.  There is notable family history of breast or ovarian cancer in her family.  The patient has regular exercise: yes.  The patient denies current symptoms of depression.    GYN History: Contraception: {method:5051}  PMHx: Past Medical History:  Diagnosis Date  . Anxiety    Possible bipolar, Type II  . Asthma   . Bipolar disorder ([place])   . Chronic headaches   . Hemorrhoids 08/2007  . IBS (irritable bowel syndrome) 08/2007  . 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 Kidney infection   . Migraines   . Multiple allergies   . Obese   . Psoriasis   . Psoriatic arthritis [facility])    Past Surgical History:  Procedure Laterality Date  . CESAREAN SECTION    . COLONOSCOPY    . COLONOSCOPY W/ BIOPSIES    . EAR TUBE REMOVAL    . ESOPHAGOGASTRODUODENOSCOPY    . LAPAROSCOPIC CHOLECYSTECTOMY  6/09   Dr. Hulen Skains  . TYMPANOSTOMY TUBE PLACEMENT     Family History  Problem Relation Age of Onset  . Colon polyps Maternal Grandfather   . Parkinson's disease Maternal Grandfather   . Breast cancer Mother   . Rheum arthritis Mother   . Irritable bowel syndrome Mother   . Asthma Father   . Anxiety disorder Father        Severe, possible bipolar  . Irritable bowel syndrome Maternal Grandmother   . Diabetes Unknown        mat. great uncles  . Alzheimer's disease Unknown        Dad's side  . Colon cancer Unknown        mat GGF   Social History   Tobacco Use  . Smoking  status: Never Smoker  . Smokeless tobacco: Never Used  Substance Use Topics  . Alcohol use: No    Alcohol/week: 0.0 oz  . Drug use: No    Current Outpatient Medications:  .  albuterol (PROVENTIL HFA;VENTOLIN HFA) 108 (90 Base) MCG/ACT inhaler, Inhale 2 puffs into the lungs every 6 (six) hours as needed for wheezing or shortness of breath., Disp: 1 Inhaler, Rfl: 1 .  ALPRAZolam (XANAX) 0.25 MG tablet, Take 1 tablet (0.25 mg total) 2 (two) times daily as needed by mouth for anxiety., Disp: 20 tablet, Rfl: 0 .  cetirizine (ZYRTEC) 10 MG tablet, Take 10 mg by mouth daily., Disp: , Rfl:  .  dicyclomine (BENTYL) 10 MG capsule, Take 1 capsule (10 mg total) by mouth 3 (three) times daily as needed for spasms (abdominal discomfort)., Disp: 30 capsule, Rfl: 0 .  DULoxetine (CYMBALTA) 30 MG capsule, Take 1 capsule (30 mg total) daily by mouth., Disp: 30 capsule, Rfl: 3 .  Fluticasone-Salmeterol (ADVAIR) 250-50 MCG/DOSE AEPB, Inhale 1 puff into the lungs 2 (two) times daily., Disp: 60 each, Rfl: 5 .  HYDROcodone-homatropine (HYCODAN) 5-1.5 MG/5ML syrup, Take 5 mLs by mouth every 8 (eight) hours as needed for cough., Disp: 120  mL, Rfl: 0 .  methotrexate (RHEUMATREX) 2.5 MG tablet, Take 6 tabs once a week for 4 weeks, Disp: , Rfl:  .  mometasone (ELOCON) 0.1 % ointment, APPLY TWICE DAILY AS NEEDED FOR PSORIASIS, Disp: 45 g, Rfl: 1 .  NIFEdipine (PROCARDIA-XL/ADALAT CC) 30 MG 24 hr tablet, Take 1 tablet (30 mg total) by mouth daily. Reported on 11/07/2015, Disp: 30 tablet, Rfl: 2 .  traMADol (ULTRAM) 50 MG tablet, Take 1 tablet (50 mg total) by mouth every 6 (six) hours as needed for moderate pain or severe pain., Disp: 30 tablet, Rfl: 0 Allergies: Sulfa antibiotics; Ciprofloxacin; and Effexor [venlafaxine hcl]  Review of Systems  Constitutional: Negative for chills, fever and malaise/fatigue.  HENT: Negative for congestion, sinus pain and sore throat.   Eyes: Negative for blurred vision and pain.    Respiratory: Negative for cough and wheezing.   Cardiovascular: Negative for chest pain and leg swelling.  Gastrointestinal: Negative for abdominal pain, constipation, diarrhea, heartburn, nausea and vomiting.  Genitourinary: Negative for dysuria, frequency, hematuria and urgency.  Musculoskeletal: Negative for back pain, joint pain, myalgias and neck pain.  Skin: Negative for itching and rash.  Neurological: Negative for dizziness, tremors and weakness.  Endo/Heme/Allergies: Does not bruise/bleed easily.  Psychiatric/Behavioral: Negative for depression. The patient is not nervous/anxious and does not have insomnia.     Objective: There were no vitals taken for this visit. There were no vitals filed for this visit. There is no height or weight on file to calculate BMI. Physical Exam  Constitutional: She is oriented to person, place, and time. She appears well-developed and well-nourished. No distress.  Genitourinary: Rectum normal, vagina normal and uterus normal. Pelvic exam was performed with patient supine. There is no rash or lesion on the right labia. There is no rash or lesion on the left labia. Vagina exhibits no lesion. No bleeding in the vagina. Right adnexum does not display mass and does not display tenderness. Left adnexum does not display mass and does not display tenderness. Cervix does not exhibit motion tenderness, lesion, friability or polyp.   Uterus is mobile and midaxial. Uterus is not enlarged or exhibiting a mass.  HENT:  Head: Normocephalic and atraumatic. Head is without laceration.  Right Ear: Hearing normal.  Left Ear: Hearing normal.  Nose: No epistaxis.  No foreign bodies.  Mouth/Throat: Uvula is midline, oropharynx is clear and moist and mucous membranes are normal.  Eyes: Pupils are equal, round, and reactive to light.  Neck: Normal range of motion. Neck supple. No thyromegaly present.  Cardiovascular: Normal rate and regular rhythm. Exam reveals no gallop  and no friction rub.  No murmur heard. Pulmonary/Chest: Effort normal and breath sounds normal. No respiratory distress. She has no wheezes. Right breast exhibits no mass, no skin change and no tenderness. Left breast exhibits no mass, no skin change and no tenderness.  Abdominal: Soft. Bowel sounds are normal. She exhibits no distension. There is no tenderness. There is no rebound.  Musculoskeletal: Normal range of motion.  Neurological: She is alert and oriented to person, place, and time. No cranial nerve deficit.  Skin: Skin is warm and dry.  Psychiatric: She has a normal mood and affect. Judgment normal.  Vitals reviewed.   Assessment:  ANNUAL EXAM No diagnosis found.   Screening Plan:            1.  Cervical Screening-  {pap:315914::"Pap smear done today"}  2. Breast screening- Exam annually and mammogram>40 planned   3. Colonoscopy  every 10 years, Hemoccult testing - after age 67  4. Labs {Blank single:19197::"managed by PCP","Ordered today","To return fasting at a later date","Declines"}  5. Counseling for contraception: {PLAN CONTRACEPTION:313102}  Other:  There are no diagnoses linked to this encounter.     F/U  No Follow-up on file.  Barnett Applebaum, MD, [facility], [facility] 07/16/2017  7:40 AM

## 2017-07-16 NOTE — Patient Instructions (Signed)
PAP every year Mammogram every year    Call [phone] to schedule at [facility] today Consider Gene Testing for breast cancer (BRCA)

## 2017-07-16 NOTE — Progress Notes (Signed)
HPI:      [name] is a 37 y.o. G2P1011 who LMP was No LMP recorded. Patient is not currently having periods (Reason: Irregular Periods)., she presents today for her annual examination. The patient has no complaints today. The patient is sexually active. Her last pap: approximate date 2017 and was abnormal: ASCUS; prior LGSIL and colposcopy done and last mammogram: approximate date 2017 (due to [place] Breast Cancer Mother age 65 at dx) and was normal. The patient does perform self breast exams.  There is notable family history of breast or ovarian cancer in her family.  The patient has regular exercise: yes.  The patient denies current symptoms of depression.   Prior excision of endometriosis form CS scar. Has been having fatigue weight gain the past few mos. Usually teg periods, stopped having them 3-4 mos ago (neg home uPT )  GYN History: Contraception: tubal ligation  PMHx: Past Medical History:  Diagnosis Date  . Anxiety    Possible bipolar, Type II  . Arthritis   . Asthma   . Bipolar disorder ([place])   . Chronic headaches   . Hemorrhoids 08/2007  . IBS (irritable bowel syndrome) 08/2007  . 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 Kidney infection   . Migraines   . Multiple allergies   . Obese   . Psoriasis   . Psoriatic arthritis [facility])    Past Surgical History:  Procedure Laterality Date  . CESAREAN SECTION    . COLONOSCOPY    . COLONOSCOPY W/ BIOPSIES    . EAR TUBE REMOVAL    . ESOPHAGOGASTRODUODENOSCOPY    . LAPAROSCOPIC CHOLECYSTECTOMY  6/09   Dr. Hulen Skains  . TYMPANOSTOMY TUBE PLACEMENT     Family History  Problem Relation Age of Onset  . Colon polyps Maternal Grandfather   . Parkinson's disease Maternal Grandfather   . Breast cancer Mother   . Rheum arthritis Mother   . Irritable bowel syndrome Mother   . Asthma Father   . Anxiety disorder Father        Severe, possible bipolar  . Irritable bowel syndrome Maternal Grandmother   . Diabetes Unknown    mat. great uncles  . Alzheimer's disease Unknown        Dad's side  . Colon cancer Unknown        mat GGF   Social History   Tobacco Use  . Smoking status: Never Smoker  . Smokeless tobacco: Never Used  Substance Use Topics  . Alcohol use: No    Alcohol/week: 0.0 oz  . Drug use: No    Current Outpatient Medications:  .  albuterol (PROVENTIL HFA;VENTOLIN HFA) 108 (90 Base) MCG/ACT inhaler, Inhale 2 puffs into the lungs every 6 (six) hours as needed for wheezing or shortness of breath., Disp: 1 Inhaler, Rfl: 1 .  ALPRAZolam (XANAX) 0.25 MG tablet, Take 1 tablet (0.25 mg total) 2 (two) times daily as needed by mouth for anxiety., Disp: 20 tablet, Rfl: 0 .  cetirizine (ZYRTEC) 10 MG tablet, Take 10 mg by mouth daily., Disp: , Rfl:  .  dicyclomine (BENTYL) 10 MG capsule, Take 1 capsule (10 mg total) by mouth 3 (three) times daily as needed for spasms (abdominal discomfort)., Disp: 30 capsule, Rfl: 0 .  DULoxetine (CYMBALTA) 30 MG capsule, Take 1 capsule (30 mg total) daily by mouth., Disp: 30 capsule, Rfl: 3 .  Fluticasone-Salmeterol (ADVAIR) 250-50 MCG/DOSE AEPB, Inhale 1 puff into the lungs 2 (two) times daily., Disp: 60  each, Rfl: 5 .  HYDROcodone-homatropine (HYCODAN) 5-1.5 MG/5ML syrup, Take 5 mLs by mouth every 8 (eight) hours as needed for cough., Disp: 120 mL, Rfl: 0 .  methotrexate (RHEUMATREX) 2.5 MG tablet, Take 6 tabs once a week for 4 weeks, Disp: , Rfl:  .  mometasone (ELOCON) 0.1 % ointment, APPLY TWICE DAILY AS NEEDED FOR PSORIASIS, Disp: 45 g, Rfl: 1 .  NIFEdipine (PROCARDIA-XL/ADALAT CC) 30 MG 24 hr tablet, Take 1 tablet (30 mg total) by mouth daily. Reported on 11/07/2015, Disp: 30 tablet, Rfl: 2 .  traMADol (ULTRAM) 50 MG tablet, Take 1 tablet (50 mg total) by mouth every 6 (six) hours as needed for moderate pain or severe pain., Disp: 30 tablet, Rfl: 0 Allergies: Sulfa antibiotics; Ciprofloxacin; and Effexor [venlafaxine hcl]  Review of Systems  Constitutional: Negative  for chills, fever and malaise/fatigue.  HENT: Negative for congestion, sinus pain and sore throat.   Eyes: Negative for blurred vision and pain.  Respiratory: Negative for cough and wheezing.   Cardiovascular: Negative for chest pain and leg swelling.  Gastrointestinal: Negative for abdominal pain, constipation, diarrhea, heartburn, nausea and vomiting.  Genitourinary: Negative for dysuria, frequency, hematuria and urgency.  Musculoskeletal: Negative for back pain, joint pain, myalgias and neck pain.  Skin: Negative for itching and rash.  Neurological: Negative for dizziness, tremors and weakness.  Endo/Heme/Allergies: Does not bruise/bleed easily.  Psychiatric/Behavioral: Negative for depression. The patient is not nervous/anxious and does not have insomnia.     Objective: BP 120/80   Pulse 83   Ht 5\' 4"  (1.626 m)   Wt 198 lb (89.8 kg)   BMI 33.99 kg/m   Filed Weights   07/16/17 0834  Weight: 198 lb (89.8 kg)   Body mass index is 33.99 kg/m. Physical Exam  Constitutional: She is oriented to person, place, and time. She appears well-developed and well-nourished. No distress.  Genitourinary: Rectum normal, vagina normal and uterus normal. Pelvic exam was performed with patient supine. There is no rash or lesion on the right labia. There is no rash or lesion on the left labia. Vagina exhibits no lesion. No bleeding in the vagina. Right adnexum does not display mass and does not display tenderness. Left adnexum does not display mass and does not display tenderness. Cervix does not exhibit motion tenderness, lesion, friability or polyp.   Uterus is mobile and midaxial. Uterus is not enlarged or exhibiting a mass.  HENT:  Head: Normocephalic and atraumatic. Head is without laceration.  Right Ear: Hearing normal.  Left Ear: Hearing normal.  Nose: No epistaxis.  No foreign bodies.  Mouth/Throat: Uvula is midline, oropharynx is clear and moist and mucous membranes are normal.  Eyes:  Pupils are equal, round, and reactive to light.  Neck: Normal range of motion. Neck supple. No thyromegaly present.  Cardiovascular: Normal rate and regular rhythm. Exam reveals no gallop and no friction rub.  No murmur heard. Pulmonary/Chest: Effort normal and breath sounds normal. No respiratory distress. She has no wheezes. Right breast exhibits no mass, no skin change and no tenderness. Left breast exhibits no mass, no skin change and no tenderness.  Abdominal: Soft. Bowel sounds are normal. She exhibits no distension. There is no tenderness. There is no rebound.  Musculoskeletal: Normal range of motion.  Neurological: She is alert and oriented to person, place, and time. No cranial nerve deficit.  Skin: Skin is warm and dry.  Psychiatric: She has a normal mood and affect. Judgment normal.  Vitals reviewed.  Assessment:  ANNUAL EXAM 1. History of cervical dysplasia   2. Annual physical exam   3. Family history of breast cancer   4. Secondary oligomenorrhea   5. History of endometriosis    Screening Plan:            1.  Cervical Screening-  Pap smear done today  2. Breast screening- Exam annually and mammogram>40 planned   3. Colonoscopy every 10 years, Hemoccult testing - after age 3  4. Labs Ordered today  5. Counseling for contraception: bilateral tubal ligation  Other:  1. History of cervical dysplasia - IGP, Aptima HPV  2. Annual physical exam  3. Family history of breast cancer - MM DIGITAL SCREENING BILATERAL; Future  4. Secondary oligomenorrhea - Beta HCG, Quant - Hemoglobin A1c - TSH - FSH/LH - Prolactin - Testosterone, Free, Total, SHBG - Estradiol - US PELVIS TRANSVANGINAL NON-OB (TV ONLY); Future  5. History of endometriosis - US PELVIS TRANSVANGINAL NON-OB (TV ONLY); Future  6. Genetic testing offered and discussed; pt wishes to defer (ins reasons today).     F/U  Return in about 1 week (around 07/23/2017) for Follow up w GYN [place].  Barnett Applebaum, MD, [facility], [facility] 07/16/2017  9:06 AM

## 2017-07-17 LAB — FSH/LH
FSH: 104.3 m[IU]/mL
LH: 69.3 m[IU]/mL

## 2017-07-17 LAB — TSH: TSH: 1.35 u[IU]/mL (ref 0.450–4.500)

## 2017-07-17 LAB — HEMOGLOBIN A1C
ESTIMATED AVERAGE GLUCOSE: 97 mg/dL
Hgb A1c MFr Bld: 5 % (ref 4.8–5.6)

## 2017-07-17 LAB — PROLACTIN: Prolactin: 12.3 ng/mL (ref 4.8–23.3)

## 2017-07-17 LAB — TESTOSTERONE, FREE, TOTAL, SHBG
Sex Hormone Binding: 42.9 nmol/L (ref 24.6–122.0)
Testosterone, Free: 0.8 pg/mL (ref 0.0–4.2)
Testosterone: 10 ng/dL (ref 8–48)

## 2017-07-17 LAB — BETA HCG QUANT (REF LAB): hCG Quant: <1 m[IU]/mL

## 2017-07-17 LAB — ESTRADIOL: Estradiol: 14.4 pg/mL

## 2017-07-18 LAB — IGP, APTIMA HPV
HPV APTIMA: NEGATIVE
PAP Smear Comment: 0

## 2017-07-21 ENCOUNTER — Encounter: Payer: Self-pay | Admitting: Obstetrics and Gynecology

## 2017-07-23 ENCOUNTER — Other Ambulatory Visit: Payer: Self-pay | Admitting: Internal Medicine

## 2017-07-23 MED ORDER — ALPRAZOLAM 0.25 MG PO TABS: 0.2500 mg | ORAL_TABLET | Freq: Two times a day (BID) | ORAL | 0 refills | Status: DC | PRN

## 2017-07-23 NOTE — Telephone Encounter (Signed)
Last filled 05-04-17 #20  Last OV 06-03-17 Next OV 12-02-17

## 2017-07-24 ENCOUNTER — Encounter: Payer: Self-pay | Admitting: Obstetrics & Gynecology

## 2017-07-24 DIAGNOSIS — Z9189 Other specified personal risk factors, not elsewhere classified: Secondary | ICD-10-CM

## 2017-07-24 DIAGNOSIS — Z1371 Encounter for nonprocreative screening for genetic disease carrier status: Secondary | ICD-10-CM

## 2017-07-24 HISTORY — DX: Encounter for nonprocreative screening for genetic disease carrier status: Z13.71

## 2017-07-24 HISTORY — DX: Other specified personal risk factors, not elsewhere classified: Z91.89

## 2017-07-27 ENCOUNTER — Ambulatory Visit (INDEPENDENT_AMBULATORY_CARE_PROVIDER_SITE_OTHER): Payer: Medicaid Other

## 2017-07-27 ENCOUNTER — Telehealth: Payer: Self-pay

## 2017-07-27 ENCOUNTER — Other Ambulatory Visit: Payer: Self-pay | Admitting: Internal Medicine

## 2017-07-27 ENCOUNTER — Ambulatory Visit (INDEPENDENT_AMBULATORY_CARE_PROVIDER_SITE_OTHER): Payer: Medicaid Other | Admitting: Obstetrics & Gynecology

## 2017-07-27 ENCOUNTER — Encounter: Payer: Self-pay | Admitting: Obstetrics & Gynecology

## 2017-07-27 VITALS — BP 122/88 | HR 97 | Ht 65.0 in | Wt 196.0 lb

## 2017-07-27 DIAGNOSIS — Z803 Family history of malignant neoplasm of breast: Secondary | ICD-10-CM | POA: Diagnosis not present

## 2017-07-27 DIAGNOSIS — N914 Secondary oligomenorrhea: Secondary | ICD-10-CM | POA: Diagnosis not present

## 2017-07-27 DIAGNOSIS — Z8742 Personal history of other diseases of the female genital tract: Secondary | ICD-10-CM | POA: Diagnosis not present

## 2017-07-27 DIAGNOSIS — E288 Other ovarian dysfunction: Secondary | ICD-10-CM

## 2017-07-27 DIAGNOSIS — Z800 Family history of malignant neoplasm of digestive organs: Secondary | ICD-10-CM | POA: Diagnosis not present

## 2017-07-27 DIAGNOSIS — E2839 Other primary ovarian failure: Secondary | ICD-10-CM | POA: Insufficient documentation

## 2017-07-27 MED ORDER — TRAMADOL HCL 50 MG PO TABS: 50.0000 mg | ORAL_TABLET | Freq: Four times a day (QID) | ORAL | 0 refills | Status: DC | PRN

## 2017-07-27 NOTE — Telephone Encounter (Signed)
Copied from [place] [id number]. Topic: Quick Communication - See Telephone Encounter >> Jul 27, 2017  4:24 PM Oneta Rack wrote: CRM for notification. See Telephone encounter for:   07/27/17.   Relation to pt: self Call back number: [phone] Pharmacy: [id number] - [place], [place] - [address] [phone] (Phone) [phone] (Fax)    Reason for call:  As per patient pharmacist requesting to speak with nurse or MD directly regarding traMADol (ULTRAM) 50 MG tablet  refill please call [id number] - [place], [place] - [address] [phone] (Phone) [phone] (Fax)   >> Jul 27, 2017  4:25 PM Oneta Rack wrote: CRM for notification. See Telephone encounter for:   07/27/17.   Relation to pt: self Call back number: [phone] Pharmacy: [id number] - [place], [place] - [address] [phone] (Phone) [phone] (Fax)    Reason for call:  As per patient pharmacist requesting to speak with nurse or MD directly regarding traMADol (ULTRAM) 50 MG tablet  refill please call [id number] - [place], [place] - [address] [phone] (Phone) [phone] (Fax)

## 2017-07-27 NOTE — Telephone Encounter (Signed)
Last filled 06-29-17 #30  Last OV 06-10-17 Next OV 12-02-17

## 2017-07-27 NOTE — Progress Notes (Signed)
  HPI: Amenorrhea Patient presents for evaluation of oligomenorrhea. Currently periods are occurring every spaced out over the last few mos, and has had fatigue and hot flashes.  Denies other sx's.   Patient has no prior thyroid or other abnormality.. Is there a chance of pregnancy? no. Factors that may be contributory to menstrual abnormalities include: none. Previous treatments for menstrual abnormalities include: none.  See recent labs.  Elevated FSH, LH.  Estradiol normal as are TSH, prolactin, testosterone  Ultrasound demonstrates normal findings, no signs of obstruction. These findings are normal  PMHx: She  has a past medical history of Anxiety, Arthritis, Asthma, Bipolar disorder ([place]), Chronic headaches, Family history of breast cancer, Hemorrhoids (08/2007), IBS (irritable bowel syndrome) (08/2007), Ileitis, terminal ([place]) (08/2007), Kidney infection, LGSIL on Pap smear of cervix, Migraines, Multiple allergies, Obese, Psoriasis, and Psoriatic arthritis ([place]). Also,  has a past surgical history that includes Laparoscopic cholecystectomy (6/09); Tympanostomy tube placement; Ear tube removal; Colonoscopy w/ biopsies; Esophagogastroduodenoscopy; Colonoscopy; and Cesarean section., family history includes Alzheimer's disease in her unknown relative; Anxiety disorder in her father; Asthma in her father; Breast cancer (age of onset: 78) in her mother; Colon cancer in her unknown relative; Colon polyps in her maternal grandfather; Diabetes in her unknown relative; Irritable bowel syndrome in her maternal grandmother and mother; Parkinson's disease in her maternal grandfather; Rheum arthritis in her mother.,  reports that  has never smoked. she has never used smokeless tobacco. She reports that she does not drink alcohol or use drugs.  She has a current medication list which includes the following prescription(s): albuterol, alprazolam, cetirizine, dicyclomine, duloxetine, fluticasone-salmeterol,  hydrocodone-homatropine, methotrexate, mometasone, nifedipine, and tramadol. Also, is allergic to sulfa antibiotics; ciprofloxacin; and effexor [venlafaxine hcl].  Review of Systems  All other systems reviewed and are negative.  Objective: BP 122/88   Pulse 97   Ht 5\' 5"  (1.651 m)   Wt 196 lb (88.9 kg)   BMI 32.62 kg/m   Physical examination Constitutional NAD, Conversant  Skin No rashes, lesions or ulceration.   Extremities: Moves all appropriately.  Normal ROM for age. No lymphadenopathy.  Neuro: Grossly intact  Psych: Oriented to PPT.  Normal mood. Normal affect.   Review of ULTRASOUND.    I have personally reviewed images and report of recent ultrasound done at [facility].    Plan of management to be discussed with patient.  Assessment:  Secondary oligomenorrhea - Plan: Ambulatory referral to Endocrinology  Premature ovarian failure - Plan: Ambulatory referral to Endocrinology  Family history of breast cancer - Plan: [facility]  Family history of colon cancer - Plan: [facility]  Referral to ENDOCRINOLOGY due to POF, assess for any other etiologies Consider HRT for symptomatic relief  Desires gene testing for FH breast and colon.  Says her ins will cover.  A total of 15 minutes were spent face-to-face with the patient during this encounter and over half of that time dealt with counseling and coordination of care.  Barnett Applebaum, MD, [facility], [facility] 07/27/2017  10:59 AM

## 2017-07-27 NOTE — Telephone Encounter (Signed)
I tried to call the pharmacy and it sent me to the prescriber vm even when I asked to speak to a pharmacist. Will call tomorrow

## 2017-07-28 ENCOUNTER — Encounter: Payer: Self-pay | Admitting: Internal Medicine

## 2017-07-28 NOTE — Telephone Encounter (Signed)
Sp[facility] to [facility] at [facility]. Ins requires the pharmacy contact us to authorize tramadol refill.

## 2017-08-03 ENCOUNTER — Encounter: Payer: Self-pay | Admitting: Obstetrics & Gynecology

## 2017-08-11 LAB — VISTASEQ BREAST AND GYN CANCER: Result Summary: NEGATIVE

## 2017-08-17 ENCOUNTER — Ambulatory Visit (INDEPENDENT_AMBULATORY_CARE_PROVIDER_SITE_OTHER): Payer: Medicaid Other | Admitting: Obstetrics & Gynecology

## 2017-08-17 ENCOUNTER — Encounter: Payer: Self-pay | Admitting: Obstetrics & Gynecology

## 2017-08-17 VITALS — BP 138/98 | HR 100 | Ht 64.0 in | Wt 194.0 lb

## 2017-08-17 DIAGNOSIS — Z800 Family history of malignant neoplasm of digestive organs: Secondary | ICD-10-CM | POA: Diagnosis not present

## 2017-08-17 DIAGNOSIS — Z803 Family history of malignant neoplasm of breast: Secondary | ICD-10-CM | POA: Diagnosis not present

## 2017-08-17 DIAGNOSIS — Z1231 Encounter for screening mammogram for malignant neoplasm of breast: Secondary | ICD-10-CM | POA: Diagnosis not present

## 2017-08-17 NOTE — Progress Notes (Signed)
  HPI: Pt presents for follow up from lab tests related to breast cancer risk.  Mother age 35, living, had breast cancer.  BRCA and testing neg.  TC model is at 40% lifetime risk.  Pt has last had MMG and MRI 4 years ago.   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 Also,  has a past surgical history that includes Laparoscopic cholecystectomy (6/09); Tympanostomy tube placement; Ear tube removal; Colonoscopy w/ biopsies; Esophagogastroduodenoscopy; Colonoscopy; and Cesarean section., family history includes Alzheimer's disease in her unknown relative; Anxiety disorder in her father; Asthma in her father; Breast cancer (age of onset: 62) in her mother; Colon cancer in her unknown relative; Colon polyps in her maternal grandfather; Diabetes in her unknown relative; Irritable bowel syndrome in her maternal grandmother and mother; Parkinson's disease in her maternal grandfather; Rheum arthritis in her mother.,  reports that  has never smoked. she has never used smokeless tobacco. She reports that she does not drink alcohol or use drugs.  She has a current medication list which includes the following prescription(s): albuterol, alprazolam, cetirizine, dicyclomine, duloxetine, fluticasone-salmeterol, hydrocodone-homatropine, methotrexate, mometasone, nifedipine, and tramadol. Also, is allergic to sulfa antibiotics; ciprofloxacin; and effexor [venlafaxine hcl].  Review of Systems  All other systems reviewed and are negative.  Objective: BP (!) 138/98   Pulse 100   Ht _0  (1.626 m)   Wt 194 lb (88 kg)   BMI 33.30  kg/m   Physical examination Constitutional NAD, Conversant  Skin No rashes, lesions or ulceration.   Extremities: Moves all appropriately.  Normal ROM for age. No lymphadenopathy.  Neuro: Grossly intact  Psych: Oriented to PPT.  Normal mood. Normal affect.   Assessment:  Family history of breast cancer and Family history of colon cancer Discussed results of BRCA testing and still elevated overall risk for breast cancer Rec MMG and MRI of the breast yearly, along w exams. To schedule  A total of 15 minutes were spent face-to-face with the patient during this encounter and over half of that time dealt with counseling and coordination of care.  Barnett Applebaum, MD, [facility], [facility] 08/17/2017  3:52 PM

## 2017-08-21 ENCOUNTER — Other Ambulatory Visit: Payer: Self-pay | Admitting: Obstetrics & Gynecology

## 2017-08-21 ENCOUNTER — Other Ambulatory Visit: Payer: Self-pay | Admitting: Internal Medicine

## 2017-08-21 DIAGNOSIS — Z1231 Encounter for screening mammogram for malignant neoplasm of breast: Secondary | ICD-10-CM

## 2017-08-21 MED ORDER — TRAMADOL HCL 50 MG PO TABS: 50.0000 mg | ORAL_TABLET | Freq: Four times a day (QID) | ORAL | 0 refills | Status: DC | PRN

## 2017-08-21 NOTE — Telephone Encounter (Signed)
[place] pt, he is out of the office.... Last filled 07/27/17... Please advise

## 2017-09-07 ENCOUNTER — Ambulatory Visit
Admission: RE | Admit: 2017-09-07 | Discharge: 2017-09-07 | Disposition: A | Discharge status: Home / Self Care | Payer: Medicaid Other | Source: Ambulatory Visit | Attending: Obstetrics & Gynecology | Admitting: Obstetrics & Gynecology

## 2017-09-07 ENCOUNTER — Encounter: Payer: Self-pay | Admitting: Radiology

## 2017-09-07 DIAGNOSIS — Z1231 Encounter for screening mammogram for malignant neoplasm of breast: Secondary | ICD-10-CM

## 2017-09-15 ENCOUNTER — Other Ambulatory Visit: Payer: Self-pay | Admitting: Internal Medicine

## 2017-09-15 MED ORDER — TRAMADOL HCL 50 MG PO TABS: 50.0000 mg | ORAL_TABLET | Freq: Four times a day (QID) | ORAL | 0 refills | Status: DC | PRN

## 2017-09-15 NOTE — Telephone Encounter (Signed)
Last filled: 08/24/16 #30 Last OV: 06/03/17 Next OV: 12/02/17

## 2017-09-19 ENCOUNTER — Other Ambulatory Visit: Payer: Self-pay | Admitting: Internal Medicine

## 2017-09-23 ENCOUNTER — Ambulatory Visit
Admission: RE | Admit: 2017-09-23 | Discharge: 2017-09-23 | Disposition: A | Discharge status: Home / Self Care | Payer: No Typology Code available for payment source | Source: Ambulatory Visit | Attending: Obstetrics & Gynecology | Admitting: Obstetrics & Gynecology

## 2017-09-23 DIAGNOSIS — Z1231 Encounter for screening mammogram for malignant neoplasm of breast: Secondary | ICD-10-CM

## 2017-09-23 MED ORDER — GADOBENATE DIMEGLUMINE 529 MG/ML IV SOLN
19.0000 mL | Freq: Once | INTRAVENOUS | Status: AC | PRN
  Administered 2017-09-23: 19 mL via INTRAVENOUS

## 2017-09-24 ENCOUNTER — Telehealth: Payer: Self-pay | Admitting: Obstetrics & Gynecology

## 2017-09-24 ENCOUNTER — Other Ambulatory Visit: Payer: Self-pay | Admitting: Obstetrics & Gynecology

## 2017-09-24 DIAGNOSIS — R928 Other abnormal and inconclusive findings on diagnostic imaging of breast: Secondary | ICD-10-CM

## 2017-09-24 NOTE — Progress Notes (Signed)
LM

## 2017-09-24 NOTE — Progress Notes (Signed)
Referral, Dr Bary Castilla, for abnormal Breast MRI with need for biopsy

## 2017-09-24 NOTE — Telephone Encounter (Signed)
Patient is calling for labs results. Please advise. 

## 2017-09-28 ENCOUNTER — Encounter: Payer: Self-pay | Admitting: General Surgery

## 2017-09-28 ENCOUNTER — Other Ambulatory Visit: Payer: Self-pay | Admitting: General Surgery

## 2017-09-28 ENCOUNTER — Ambulatory Visit: Payer: Medicaid Other | Admitting: General Surgery

## 2017-09-28 VITALS — BP 138/80 | HR 79 | Resp 14 | Ht 64.0 in | Wt 192.8 lb

## 2017-09-28 DIAGNOSIS — Z803 Family history of malignant neoplasm of breast: Secondary | ICD-10-CM | POA: Diagnosis not present

## 2017-09-28 DIAGNOSIS — R928 Other abnormal and inconclusive findings on diagnostic imaging of breast: Secondary | ICD-10-CM

## 2017-09-28 NOTE — Patient Instructions (Addendum)
MRI guided biopsy to be scheduled. We will call you with the results.

## 2017-09-28 NOTE — Progress Notes (Signed)
Patient ID: Bridget Maldonado, female   DOB: 05-28-1981, 37 y.o.   MRN: [id number]  Chief Complaint  Patient presents with  . Other    abnormal Breast MRI    HPI Bridget Maldonado is a 37 y.o. female here for evaluation of an abnormal MRI of the breast done on 09/23/17. Her most recent mammogram was done on 09/07/17. She does not annual mammograms and does routine breast checks. She has a family history of breast cancer. Her mother was diagnosed with stage 3 breast cancer at age 22.   She recently had genetic testing done and did not carry the gene, but had a risk assessment of 1 out of 2. It was decided to do a breast MRI.       She does report a skin tag on the left breast and a couple episodes of clear nipple discharge. She reports no other breast issues. She reports no injuries to the breast. 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                                               HPI  Past Medical History:  Diagnosis Date  . Anxiety    Possible bipolar, Type II  . Arthritis   . Asthma   . Bipolar disorder ([place])   . BRCA negative 07/2017   vistaseq neg  . Chronic headaches   . Endometriosis 2018  . Family history of breast cancer   . Hemorrhoids 08/2007  . IBS (irritable bowel syndrome) 08/2007  . Ileitis, terminal ([place]) 08/2007  . Increased risk of breast cancer 07/2017   IBIS=40%  . Kidney infection   . LGSIL on Pap smear of cervix   . Migraines   . Multiple allergies   . Obese   . Psoriasis   . Psoriatic arthritis [facility])     Past Surgical History:  Procedure Laterality Date  . BREAST BIOPSY Left 8 years  . CESAREAN SECTION    . COLONOSCOPY    . COLONOSCOPY W/ BIOPSIES    . EAR TUBE REMOVAL    . ESOPHAGOGASTRODUODENOSCOPY    . LAPAROSCOPIC CHOLECYSTECTOMY  6/09   Dr. Hulen Skains  . TUBAL LIGATION  2017  . TYMPANOSTOMY TUBE PLACEMENT      Family History  Problem Relation Age of Onset  . Colon polyps Maternal Grandfather   . Parkinson's disease Maternal Grandfather   .  Breast cancer Mother 84  . Rheum arthritis Mother   . Irritable bowel syndrome Mother   . Asthma Father   . Anxiety disorder Father        Severe, possible bipolar  . Irritable bowel syndrome Maternal Grandmother   . Diabetes Unknown        mat. great uncles  . Alzheimer's disease Unknown        Dad's side  . Colon cancer Unknown        mat GGF    Social History Social History   Tobacco Use  . Smoking status: Never Smoker  . Smokeless tobacco: Never Used  Substance Use Topics  . Alcohol use: No    Alcohol/week: 0.0 oz  . Drug use: No    Allergies  Allergen Reactions  . Sulfa Antibiotics Nausea And Vomiting  . Ciprofloxacin Nausea And Vomiting  . Effexor [Venlafaxine Hcl] Other (See Comments)  Reaction:  Made pt sleep for two days straight.    Current Outpatient Medications  Medication Sig Dispense Refill  . albuterol (PROVENTIL HFA;VENTOLIN HFA) 108 (90 Base) MCG/ACT inhaler Inhale 2 puffs into the lungs every 6 (six) hours as needed for wheezing or shortness of breath. 1 Inhaler 1  . ALPRAZolam (XANAX) 0.25 MG tablet Take 1 tablet (0.25 mg total) by mouth 2 (two) times daily as needed for anxiety. 20 tablet 0  . cetirizine (ZYRTEC) 10 MG tablet Take 10 mg by mouth daily.    . DULoxetine (CYMBALTA) 30 MG capsule TAKE 1 CAPSULE (30 MG TOTAL) DAILY BY MOUTH. 30 capsule 2  . Fluticasone-Salmeterol (ADVAIR) 250-50 MCG/DOSE AEPB Inhale 1 puff into the lungs 2 (two) times daily. 60 each 5  . mometasone (ELOCON) 0.1 % ointment APPLY TWICE DAILY AS NEEDED FOR PSORIASIS 45 g 1  . traMADol (ULTRAM) 50 MG tablet Take 1 tablet (50 mg total) by mouth every 6 (six) hours as needed for moderate pain or severe pain. 30 tablet 0  . dicyclomine (BENTYL) 10 MG capsule Take 1 capsule (10 mg total) by mouth 3 (three) times daily as needed for spasms (abdominal discomfort). 30 capsule 0  . fluticasone (FLONASE) 50 MCG/ACT nasal spray as needed.  0   No current facility-administered  medications for this visit.     Review of Systems Review of Systems  Constitutional: Negative.   Respiratory: Negative.   Cardiovascular: Negative.     Blood pressure 138/80, pulse 79, resp. rate 14, height '5\' 4"'  (1.626 m), weight 192 lb 12.8 oz (87.5 kg).  Physical Exam Physical Exam  Constitutional: She is oriented to person, place, and time. She appears well-developed and well-nourished.  Eyes: Conjunctivae are normal. No scleral icterus.  Neck: Neck supple.  Cardiovascular: Normal rate, regular rhythm and normal heart sounds.  Pulmonary/Chest: Effort normal and breath sounds normal. Right breast exhibits no inverted nipple, no mass, no nipple discharge, no skin change and no tenderness. Left breast exhibits no inverted nipple, no mass, no nipple discharge, no skin change and no tenderness.    Lymphadenopathy:    She has no cervical adenopathy.    She has no axillary adenopathy.  Neurological: She is alert and oriented to person, place, and time.  Skin: Skin is warm and dry.  Psychiatric: She has a normal mood and affect.    Data Reviewed Bilateral mammograms of September 07, 2017, breast MRI of September 23, 2017 reviewed.  GYN notes of Barnett Applebaum, MD reviewed.  [name] estimated risk for breast cancer: 40% lifetime. [place] model risk: 22%.  Patient genetic testing for BRCA 1 and 2- on July 27, 2017.  Assessment    Abnormal MRI, biopsy pending.    Plan    MRI guided left breast biopsy to be scheduled. We will call the patient with results.      HPI, Physical Exam, Assessment and Plan have been scribed under the direction and in the presence of Robert Bellow, MD  Concepcion Living, LPN   Forest Gleason Mearle Drew 09/30/2017, 5:54 PM  The patient has been scheduled for a left breast MRI guided biopsy at the [facility] for 10-02-17 at 7:10 am (she is aware to arrive at 7 am).   Dominga Ferry, CMA

## 2017-09-30 DIAGNOSIS — R928 Other abnormal and inconclusive findings on diagnostic imaging of breast: Secondary | ICD-10-CM | POA: Insufficient documentation

## 2017-10-02 ENCOUNTER — Ambulatory Visit
Admission: RE | Admit: 2017-10-02 | Discharge: 2017-10-02 | Disposition: A | Discharge status: Home / Self Care | Payer: Medicaid Other | Source: Ambulatory Visit | Attending: General Surgery | Admitting: General Surgery

## 2017-10-02 DIAGNOSIS — Z803 Family history of malignant neoplasm of breast: Secondary | ICD-10-CM

## 2017-10-02 DIAGNOSIS — R928 Other abnormal and inconclusive findings on diagnostic imaging of breast: Secondary | ICD-10-CM

## 2017-10-02 MED ORDER — GADOBENATE DIMEGLUMINE 529 MG/ML IV SOLN
18.0000 mL | Freq: Once | INTRAVENOUS | Status: AC | PRN
Start: 2017-10-02 — End: 2017-10-02
  Administered 2017-10-02: 18 mL via INTRAVENOUS

## 2017-10-05 ENCOUNTER — Telehealth: Payer: Self-pay | Admitting: General Surgery

## 2017-10-05 NOTE — Telephone Encounter (Signed)
That would be great if you want to be the point person on imaging scheduling.

## 2017-10-05 NOTE — Telephone Encounter (Signed)
I will be content with continuing her surveillance and scheduling of these tests

## 2017-10-05 NOTE — Telephone Encounter (Signed)
Thank you :)

## 2017-10-05 NOTE — Telephone Encounter (Signed)
Noted Let me know if you want me to handle this ----I think her gyn handles her routine care though

## 2017-10-05 NOTE — Telephone Encounter (Signed)
Message left that biopsy results were fine. Fibroadenoma.  To call for details   Candidate for annual mammogram and MRI based on calculated risk of breast cancer even though she is BRCA negative.  Imaging studies can be staggered 6 mo apart.  Will coordinate with GYN/ PCP re: scheduling.

## 2017-10-12 ENCOUNTER — Other Ambulatory Visit: Payer: Self-pay | Admitting: Internal Medicine

## 2017-10-12 MED ORDER — TRAMADOL HCL 50 MG PO TABS: 50.0000 mg | ORAL_TABLET | Freq: Four times a day (QID) | ORAL | 0 refills | Status: DC | PRN

## 2017-10-12 NOTE — Telephone Encounter (Signed)
Requesting ref[facility]ad[facility] to [facility]; Last refilled # 30 on 09/15/17 Last seen f/u 06/03/17.

## 2017-10-17 ENCOUNTER — Other Ambulatory Visit: Payer: Self-pay

## 2017-10-17 ENCOUNTER — Emergency Department: Payer: Medicaid Other

## 2017-10-17 ENCOUNTER — Emergency Department
Admission: EM | Admit: 2017-10-17 | Discharge: 2017-10-17 | Disposition: A | Discharge status: Home / Self Care | Payer: Medicaid Other | Attending: Emergency Medicine | Admitting: Emergency Medicine

## 2017-10-17 DIAGNOSIS — N760 Acute vaginitis: Secondary | ICD-10-CM | POA: Insufficient documentation

## 2017-10-17 DIAGNOSIS — N939 Abnormal uterine and vaginal bleeding, unspecified: Secondary | ICD-10-CM

## 2017-10-17 DIAGNOSIS — J45909 Unspecified asthma, uncomplicated: Secondary | ICD-10-CM | POA: Insufficient documentation

## 2017-10-17 DIAGNOSIS — R102 Pelvic and perineal pain: Secondary | ICD-10-CM

## 2017-10-17 LAB — CHLAMYDIA/NGC RT PCR (ARMC ONLY)
Chlamydia Tr: NOT DETECTED
N GONORRHOEAE: NOT DETECTED

## 2017-10-17 LAB — CBC
HEMATOCRIT: 44.1 % (ref 35.0–47.0)
Hemoglobin: 14.8 g/dL (ref 12.0–16.0)
MCH: 29.4 pg (ref 26.0–34.0)
MCHC: 33.7 g/dL (ref 32.0–36.0)
MCV: 87.2 fL (ref 80.0–100.0)
PLATELETS: 245 10*3/uL (ref 150–440)
RBC: 5.06 MIL/uL (ref 3.80–5.20)
RDW: 12.9 % (ref 11.5–14.5)
WBC: 6.1 10*3/uL (ref 3.6–11.0)

## 2017-10-17 LAB — BASIC METABOLIC PANEL
ANION GAP: 7 (ref 5–15)
BUN: 17 mg/dL (ref 6–20)
CALCIUM: 8.7 mg/dL — AB (ref 8.9–10.3)
CO2: 24 mmol/L (ref 22–32)
Chloride: 106 mmol/L (ref 101–111)
Creatinine, Ser: 0.7 mg/dL (ref 0.44–1.00)
Glucose, Bld: 91 mg/dL (ref 65–99)
Potassium: 3.8 mmol/L (ref 3.5–5.1)
Sodium: 137 mmol/L (ref 135–145)

## 2017-10-17 LAB — WET PREP, GENITAL
Sperm: NONE SEEN
Trich, Wet Prep: NONE SEEN
Yeast Wet Prep HPF POC: NONE SEEN

## 2017-10-17 LAB — HCG, QUANTITATIVE, PREGNANCY

## 2017-10-17 MED ORDER — TRAMADOL HCL 50 MG PO TABS: 50.0000 mg | ORAL_TABLET | Freq: Four times a day (QID) | ORAL | 0 refills | Status: DC | PRN

## 2017-10-17 MED ORDER — METRONIDAZOLE 500 MG PO TABS: 500.0000 mg | ORAL_TABLET | Freq: Two times a day (BID) | ORAL | 0 refills | Status: DC

## 2017-10-17 MED ORDER — IBUPROFEN 800 MG PO TABS
800.0000 mg | ORAL_TABLET | Freq: Once | ORAL | Status: AC
  Administered 2017-10-17: 800 mg via ORAL
  Filled 2017-10-17: qty 1

## 2017-10-17 MED ORDER — OXYCODONE-ACETAMINOPHEN 5-325 MG PO TABS
2.0000 | ORAL_TABLET | Freq: Once | ORAL | Status: AC
  Administered 2017-10-17: 2 via ORAL
  Filled 2017-10-17: qty 2

## 2017-10-17 NOTE — ED Provider Notes (Signed)
[facility] Emergency Department Provider Note       Time seen: ----------------------------------------- 4:32 PM on 10/17/2017 -----------------------------------------   I have reviewed the triage vital signs and the nursing notes.  HISTORY   Chief Complaint Pelvic Pain and Vaginal Bleeding    HPI Bridget Maldonado is a 37 y.o. female with a history of anxiety, arthritis, asthma, bipolar disorder, chronic headaches, endometriosis, IBS, migraines who presents to the ED for heavy vaginal bleeding.  Patient states for the past few days she has had significant pelvic pressure.  Last night she started having vaginal bleeding with clots and she felt like something was protruding from her vagina.  Patient states when she lays down this takes pressure off but when walking her pain increases.  She states she is bleeding through 3-4 pads per hour, has not had a menstrual cycle since before Christmas.  Past Medical History:  Diagnosis Date  . Anxiety    Possible bipolar, Type II  . Arthritis   . Asthma   . Bipolar disorder ([place])   . BRCA negative 07/2017   vistaseq neg  . Chronic headaches   . Endometriosis 2018  . Family history of breast cancer   . Hemorrhoids 08/2007  . IBS (irritable bowel syndrome) 08/2007  . 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 Increased risk of breast cancer 07/2017   IBIS=40%  . Kidney infection   . LGSIL on Pap smear of cervix   . Migraines   . Multiple allergies   . Obese   . Psoriasis   . Psoriatic arthritis [facility])     Patient Active Problem List   Diagnosis Date Noted  . Abnormal MRI, breast 09/30/2017  . Family history of colon cancer 07/27/2017  . 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  . History of cervical dysplasia 07/16/2017  . Family history of breast cancer 07/16/2017  . Secondary oligomenorrhea 07/16/2017  . History of endometriosis 07/16/2017  . Psoriatic arthritis ([place]) 10/28/2016  . Chronic low back pain  10/28/2016  . Essential (primary) hypertension 10/18/2014  . Arthritis 11/24/2013  . Arthropathia 11/24/2013  . Obstipation 04/12/2013  . Migraine with status migrainosus 09/08/2012  . 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 Mild persistent asthma 05/26/2011  . Severe anxiety with panic 08/29/2006  . Panic disorder without agoraphobia 08/29/2006    Past Surgical History:  Procedure Laterality Date  . BREAST BIOPSY Left 8 years  . CESAREAN SECTION    . COLONOSCOPY    . COLONOSCOPY W/ BIOPSIES    . EAR TUBE REMOVAL    . ESOPHAGOGASTRODUODENOSCOPY    . LAPAROSCOPIC CHOLECYSTECTOMY  6/09   Dr. Hulen Skains  . TUBAL LIGATION  2017  . TYMPANOSTOMY TUBE PLACEMENT      Allergies Sulfa antibiotics; Ciprofloxacin; and Effexor [venlafaxine hcl]  Social History Social History   Tobacco Use  . Smoking status: Never Smoker  . Smokeless tobacco: Never Used  Substance Use Topics  . Alcohol use: No    Alcohol/week: 0.0 oz  . Drug use: No   Review of Systems Constitutional: Negative for fever. Cardiovascular: Negative for chest pain. Respiratory: Negative for shortness of breath. Gastrointestinal: Negative for abdominal pain, vomiting and diarrhea. Genitourinary: Positive for vaginal bleeding Musculoskeletal: Negative for back pain. Skin: Negative for rash. Neurological: Negative for headaches, focal weakness or numbness.  All systems negative/normal/unremarkable except as stated in the HPI  ____________________________________________   PHYSICAL EXAM:  VITAL SIGNS: ED Triage Vitals  Enc Vitals Group  BP 10/17/17 1236 (!) 147/104     Pulse Rate 10/17/17 1236 100     Resp 10/17/17 1236 18     Temp 10/17/17 1236 99 F (37.2 C)     Temp Source 10/17/17 1236 Oral     SpO2 10/17/17 1236 100 %     Weight 10/17/17 1237 185 lb (83.9 kg)     Height 10/17/17 1237 '5\' 4"'  (1.626 m)     Head Circumference --      Peak Flow --      Pain Score 10/17/17 1237  8     Pain Loc --      Pain Edu? --      Excl. in [place]? --    Constitutional: Alert and oriented. Well appearing and in no distress. Eyes: Conjunctivae are normal. Normal extraocular movements. Cardiovascular: Normal rate, regular rhythm. No murmurs, rubs, or gallops. Respiratory: Normal respiratory effort without tachypnea nor retractions. Breath sounds are clear and equal bilaterally. No wheezes/rales/rhonchi. Gastrointestinal: Soft and nontender. Normal bowel sounds Musculoskeletal: Nontender with normal range of motion in extremities. No lower extremity tenderness nor edema. Neurologic:  Normal speech and language. No gross focal neurologic deficits are appreciated.  Skin:  Skin is warm, dry and intact. No rash noted. Psychiatric: Mood and affect are normal. Speech and behavior are normal.  ____________________________________________  ED COURSE:  As part of my medical decision making, I reviewed the following data within the [place] History obtained from family if available, nursing notes, old chart and ekg, as well as notes from prior ED visits. Patient presented for heavy vaginal bleeding and pelvic pressure, we will assess with labs and imaging as indicated at this time.   Procedures ____________________________________________   LABS (pertinent positives/negatives)  Labs Reviewed  WET PREP, GENITAL - Abnormal; Notable for the following components:      Result Value   Clue Cells Wet Prep HPF POC PRESENT (*)    WBC, Wet Prep HPF POC FEW (*)    All other components within normal limits  BASIC METABOLIC PANEL - Abnormal; Notable for the following components:   Calcium 8.7 (*)    All other components within normal limits  CHLAMYDIA/NGC RT PCR ([facility] ONLY)  CBC  HCG, QUANTITATIVE, PREGNANCY  URINALYSIS, ROUTINE W REFLEX MICROSCOPIC  POC URINE PREG, ED    RADIOLOGY Images were viewed by me  Pelvic ultrasound IMPRESSION: Normal appearance of  the right ovary.  The left ovary is not visualized and therefore not assessed.  No acute process within the pelvis.  ____________________________________________  DIFFERENTIAL DIAGNOSIS   Chronic pain, abnormal vaginal bleeding, prolapse, pregnancy  FINAL ASSESSMENT AND PLAN  Abnormal vaginal bleeding, bacterial vaginosis   Plan: The patient had presented for abnormal vaginal bleeding. Patient's labs were unremarkable. Patient's imaging was reassuring.  Patient be discharged with Flagyl and is stable for outpatient follow-up.   Laurence Aly, MD   Note: This note was generated in part or whole with voice recognition software. Voice recognition is usually quite accurate but there are transcription errors that can and very often do occur. I apologize for any typographical errors that were not detected and corrected.     Earleen Newport, MD 10/17/17 [phone]

## 2017-10-17 NOTE — ED Notes (Signed)
Pt taken to US

## 2017-10-17 NOTE — ED Notes (Signed)
Noted no POC, patient states unable to void. Serum HCG added. Lab notifed.

## 2017-10-17 NOTE — ED Triage Notes (Signed)
Pt states she hasn't had a period this year, states she is in premature ovarian failure. States for past few days she has had a lot of pelvic pressure. Last night started having vaginal bleeding with clots. Pt states "I think something is coming out of my vagina." states lying down takes pressure off but walking, sitting is painful for her. Has had tubes tied. Alert, oriented, ambulatory. States bleeding through 3-4 pads per hour, states previous periods have never been this heavy. States R side of pelvis is swollen.

## 2017-10-21 ENCOUNTER — Telehealth: Payer: Self-pay

## 2017-10-21 NOTE — Telephone Encounter (Signed)
Spoke to pt about her recent ER visit for abnormal vaginal bleeding. Going to [facility]. Thinks it is uterine prolapse.

## 2017-10-26 ENCOUNTER — Ambulatory Visit: Payer: Medicaid Other | Admitting: Obstetrics & Gynecology

## 2017-11-13 ENCOUNTER — Other Ambulatory Visit: Payer: Self-pay | Admitting: Internal Medicine

## 2017-11-13 ENCOUNTER — Encounter: Payer: Self-pay | Admitting: Internal Medicine

## 2017-11-13 MED ORDER — TRAMADOL HCL 50 MG PO TABS: 50.0000 mg | ORAL_TABLET | Freq: Four times a day (QID) | ORAL | 0 refills | Status: DC | PRN

## 2017-11-13 MED ORDER — ALPRAZOLAM 0.25 MG PO TABS: 0.2500 mg | ORAL_TABLET | Freq: Two times a day (BID) | ORAL | 0 refills | Status: DC | PRN

## 2017-11-13 NOTE — Telephone Encounter (Signed)
Alprazolam last filled 07-23-17 #20 Tramadol last filled 10-12-17 #30  Last OV 06-19-17 Next OV 12-02-17

## 2017-11-17 NOTE — Telephone Encounter (Signed)
Called the pharmacy and the pt was able to get her meds.

## 2017-12-02 ENCOUNTER — Ambulatory Visit: Payer: Medicaid Other | Admitting: Internal Medicine

## 2017-12-18 ENCOUNTER — Other Ambulatory Visit: Payer: Self-pay | Admitting: Internal Medicine

## 2017-12-18 MED ORDER — TRAMADOL HCL 50 MG PO TABS: 50.0000 mg | ORAL_TABLET | Freq: Four times a day (QID) | ORAL | 0 refills | Status: DC | PRN

## 2017-12-18 NOTE — Telephone Encounter (Signed)
Last filled 11-13-17 #30  Last OV 06-03-17 No Future OV  Pt No Showed 12-02-17  [facility]

## 2017-12-22 ENCOUNTER — Ambulatory Visit: Payer: Medicaid Other | Admitting: Internal Medicine

## 2018-01-06 ENCOUNTER — Encounter: Payer: Self-pay | Admitting: Internal Medicine

## 2018-01-06 ENCOUNTER — Ambulatory Visit: Payer: Medicaid Other | Admitting: Internal Medicine

## 2018-01-13 ENCOUNTER — Other Ambulatory Visit: Payer: Self-pay | Admitting: Internal Medicine

## 2018-01-13 MED ORDER — TRAMADOL HCL 50 MG PO TABS: 50.0000 mg | ORAL_TABLET | Freq: Four times a day (QID) | ORAL | 0 refills | Status: DC | PRN

## 2018-01-13 MED ORDER — ALPRAZOLAM 0.25 MG PO TABS: 0.2500 mg | ORAL_TABLET | Freq: Two times a day (BID) | ORAL | 0 refills | Status: DC | PRN

## 2018-01-13 NOTE — Telephone Encounter (Signed)
Electronic refill request Last office visit 06/03/17 Upcoming appointment 01/22/18 Alprazolam last refill 11/13/17 #20 Tramadol last refill 12/18/16 #30

## 2018-01-22 ENCOUNTER — Encounter: Payer: Self-pay | Admitting: Internal Medicine

## 2018-01-22 ENCOUNTER — Ambulatory Visit (INDEPENDENT_AMBULATORY_CARE_PROVIDER_SITE_OTHER): Payer: Medicaid Other | Admitting: Internal Medicine

## 2018-01-22 VITALS — BP 124/86 | HR 87 | Temp 98.1°F | Ht 64.0 in | Wt 188.0 lb

## 2018-01-22 DIAGNOSIS — F410 Panic disorder [episodic paroxysmal anxiety] without agoraphobia: Secondary | ICD-10-CM

## 2018-01-22 MED ORDER — DULOXETINE HCL 60 MG PO CPEP: 60.0000 mg | ORAL_CAPSULE | Freq: Every day | ORAL | 3 refills | Status: DC

## 2018-01-22 NOTE — Assessment & Plan Note (Signed)
Some secondary depression due to pain and uncontrolled anxiety No MDD Did have initial response to duloxetine ---will increase to 60mg  Consider trazodone for sleep if not any better in the next month or so

## 2018-01-22 NOTE — Progress Notes (Signed)
Subjective:    Patient ID: Bridget Maldonado, female    DOB: 14-May-1981, 37 y.o.   MRN: [id number]  HPI Here for follow up of her anxiety  Couldn't see the new rheumatologist--- due to MCD coverage Is going back to see Dr Meda Coffee soon Bad psoriasis in feet and pain in multiple joints Off MTX because she didn't tolerate it  Her anxiety seems to have been worsening again Stress with other symptoms are part of it Anger "creeping back" Temper is triggered easy Did have a great initial response  Feels exhausted all the time Not really depressed  Current Outpatient Medications on File Prior to Visit  Medication Sig Dispense Refill  . albuterol (PROVENTIL HFA;VENTOLIN HFA) 108 (90 Base) MCG/ACT inhaler Inhale 2 puffs into the lungs every 6 (six) hours as needed for wheezing or shortness of breath. 1 Inhaler 1  . ALPRAZolam (XANAX) 0.25 MG tablet Take 1 tablet (0.25 mg total) by mouth 2 (two) times daily as needed for anxiety. 20 tablet 0  . cetirizine (ZYRTEC) 10 MG tablet Take 10 mg by mouth daily.    . DULoxetine (CYMBALTA) 30 MG capsule TAKE 1 CAPSULE (30 MG TOTAL) DAILY BY MOUTH. 30 capsule 2  . Fluticasone-Salmeterol (ADVAIR) 250-50 MCG/DOSE AEPB Inhale 1 puff into the lungs 2 (two) times daily. 60 each 5  . mometasone (ELOCON) 0.1 % ointment APPLY TWICE DAILY AS NEEDED FOR PSORIASIS 45 g 1  . traMADol (ULTRAM) 50 MG tablet Take 1 tablet (50 mg total) by mouth every 6 (six) hours as needed for moderate pain or severe pain. 30 tablet 0   No current facility-administered medications on file prior to visit.     Allergies  Allergen Reactions  . Sulfa Antibiotics Nausea And Vomiting  . Ciprofloxacin Nausea And Vomiting  . Effexor [Venlafaxine Hcl] Other (See Comments)    Reaction:  Made pt sleep for two days straight.    Past Medical History:  Diagnosis Date  . Anxiety    Possible bipolar, Type II  . Arthritis   . Asthma   . Bipolar disorder ([place])   . BRCA negative 07/2017   vistaseq neg  . Chronic headaches   . Endometriosis 2018  . Family history of breast cancer   . Hemorrhoids 08/2007  . IBS (irritable bowel syndrome) 08/2007  . 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 Increased risk of breast cancer 07/2017   IBIS=40%  . Kidney infection   . LGSIL on Pap smear of cervix   . Migraines   . Multiple allergies   . Obese   . Psoriasis   . Psoriatic arthritis [facility])     Past Surgical History:  Procedure Laterality Date  . BREAST BIOPSY Left 8 years  . CESAREAN SECTION    . COLONOSCOPY    . COLONOSCOPY W/ BIOPSIES    . EAR TUBE REMOVAL    . ESOPHAGOGASTRODUODENOSCOPY    . LAPAROSCOPIC CHOLECYSTECTOMY  6/09   Dr. Hulen Skains  . TUBAL LIGATION  2017  . TYMPANOSTOMY TUBE PLACEMENT      Family History  Problem Relation Age of Onset  . Colon polyps Maternal Grandfather   . Parkinson's disease Maternal Grandfather   . Breast cancer Mother 39  . Rheum arthritis Mother   . Irritable bowel syndrome Mother   . Asthma Father   . Anxiety disorder Father        Severe, possible bipolar  . Irritable bowel syndrome Maternal Grandmother   . Diabetes Unknown  mat. great uncles  . Alzheimer's disease Unknown        Dad's side  . Colon cancer Unknown        mat GGF    Social History   Socioeconomic History  . Marital status: Single    Spouse name: Not on file  . Number of children: 1  . Years of education: Not on file  . Highest education level: Not on file  Occupational History  . Occupation: Herbalist    Comment: [employer]  . Financial resource strain: Not on file  . Food insecurity:    Worry: Not on file    Inability: Not on file  . Transportation needs:    Medical: Not on file    Non-medical: Not on file  Tobacco Use  . Smoking status: Never Smoker  . Smokeless tobacco: Never Used  Substance and Sexual Activity  . Alcohol use: No    Alcohol/week: 0.0 oz  . Drug use: No  . Sexual activity: Yes     Birth control/protection: Surgical  Lifestyle  . Physical activity:    Days per week: Not on file    Minutes per session: Not on file  . Stress: Not on file  Relationships  . Social connections:    Talks on phone: Not on file    Gets together: Not on file    Attends religious service: Not on file    Active member of club or organization: Not on file    Attends meetings of clubs or organizations: Not on file    Relationship status: Not on file  . Intimate partner violence:    Fear of current or ex partner: Not on file    Emotionally abused: Not on file    Physically abused: Not on file    Forced sexual activity: Not on file  Other Topics Concern  . Not on file  Social History Narrative  . Not on file   Review of Systems Not sleeping well-- melatonin and unisom not really helpful Has uterine and bladder prolapse--has appt at [facility] to review with surgeon Pain was severe prompting ER visit--pain is better now Appetite is variable Weight fairly stable now ---did lose some weight from depression a few weeks ago    Objective:   Physical Exam  Constitutional: She appears well-developed. No distress.  Psychiatric:  No overt depression  Normal conversation and appearance Affect is appropriate           Assessment & Plan:

## 2018-01-31 ENCOUNTER — Emergency Department: Payer: Medicaid Other

## 2018-01-31 ENCOUNTER — Emergency Department
Admission: EM | Admit: 2018-01-31 | Discharge: 2018-01-31 | Disposition: A | Discharge status: Home / Self Care | Payer: Medicaid Other | Attending: Emergency Medicine | Admitting: Emergency Medicine

## 2018-01-31 ENCOUNTER — Encounter: Payer: Self-pay | Admitting: Emergency Medicine

## 2018-01-31 ENCOUNTER — Other Ambulatory Visit: Payer: Self-pay

## 2018-01-31 DIAGNOSIS — S39012A Strain of muscle, fascia and tendon of lower back, initial encounter: Secondary | ICD-10-CM

## 2018-01-31 DIAGNOSIS — S3992XA Unspecified injury of lower back, initial encounter: Secondary | ICD-10-CM | POA: Diagnosis present

## 2018-01-31 DIAGNOSIS — Y929 Unspecified place or not applicable: Secondary | ICD-10-CM | POA: Insufficient documentation

## 2018-01-31 DIAGNOSIS — Z79899 Other long term (current) drug therapy: Secondary | ICD-10-CM | POA: Diagnosis not present

## 2018-01-31 DIAGNOSIS — X501XXA Overexertion from prolonged static or awkward postures, initial encounter: Secondary | ICD-10-CM | POA: Insufficient documentation

## 2018-01-31 DIAGNOSIS — Y9389 Activity, other specified: Secondary | ICD-10-CM | POA: Insufficient documentation

## 2018-01-31 DIAGNOSIS — Y999 Unspecified external cause status: Secondary | ICD-10-CM | POA: Insufficient documentation

## 2018-01-31 MED ORDER — ORPHENADRINE CITRATE 30 MG/ML IJ SOLN
60.0000 mg | Freq: Once | INTRAMUSCULAR | Status: AC
  Administered 2018-01-31: 60 mg via INTRAMUSCULAR
  Filled 2018-01-31: qty 2

## 2018-01-31 MED ORDER — HYDROCODONE-ACETAMINOPHEN 5-325 MG PO TABS
1.0000 | ORAL_TABLET | Freq: Once | ORAL | Status: AC
  Administered 2018-01-31: 1 via ORAL
  Filled 2018-01-31: qty 1

## 2018-01-31 MED ORDER — KETOROLAC TROMETHAMINE 30 MG/ML IJ SOLN
30.0000 mg | Freq: Once | INTRAMUSCULAR | Status: AC
  Administered 2018-01-31: 30 mg via INTRAMUSCULAR
  Filled 2018-01-31: qty 1

## 2018-01-31 MED ORDER — MELOXICAM 15 MG PO TABS: 15.0000 mg | ORAL_TABLET | Freq: Every day | ORAL | 0 refills | Status: DC

## 2018-01-31 MED ORDER — METHOCARBAMOL 500 MG PO TABS: 500.0000 mg | ORAL_TABLET | Freq: Four times a day (QID) | ORAL | 0 refills | Status: DC

## 2018-01-31 NOTE — ED Triage Notes (Signed)
Pt arrived via POV with family with reports of low back pain that started after cleaning and moving things around.  Pt states she took ibuprofen and tylenol.

## 2018-01-31 NOTE — ED Provider Notes (Signed)
[facility] Emergency Department Provider Note  ____________________________________________  Time seen: Approximately 6:56 PM  I have reviewed the triage vital signs and the nursing notes.   HISTORY  Chief Complaint Back Pain    HPI Bridget Maldonado is a 37 y.o. female who presents the emergency department complaining of  low back pain.  Patient reports that she was holding her 47-year-old, bent over to pick up some laundry felt a sharp pop in her lower back.  Patient reports that she had immediate pain, now with standing, sitting she has increasing pain radiating into her right back.  This does not run down her legs.  No saddle anesthesia, paresthesias, bowel or bladder dysfunction.  Patient reports that the pain has made her nauseated to the point that she had one episode of emesis.  Tylenol and Motrin have alleviated symptoms somewhat but continues to have pain.  No other complaints at this time.  No urinary symptoms, abdominal pain.   Past Medical History:  Diagnosis Date  . Anxiety    Possible bipolar, Type II  . Arthritis   . Asthma   . Bipolar disorder ([place])   . BRCA negative 07/2017   vistaseq neg  . Chronic headaches   . Endometriosis 2018  . Family history of breast cancer   . Hemorrhoids 08/2007  . IBS (irritable bowel syndrome) 08/2007  . 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 Increased risk of breast cancer 07/2017   IBIS=40%  . Kidney infection   . LGSIL on Pap smear of cervix   . Migraines   . Multiple allergies   . Obese   . Psoriasis   . Psoriatic arthritis [facility])     Patient Active Problem List   Diagnosis Date Noted  . Abnormal MRI, breast 09/30/2017  . Family history of colon cancer 07/27/2017  . 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  . History of cervical dysplasia 07/16/2017  . Family history of breast cancer 07/16/2017  . Secondary oligomenorrhea 07/16/2017  . History of endometriosis 07/16/2017  . Psoriatic arthritis ([place])  10/28/2016  . Chronic low back pain 10/28/2016  . Essential (primary) hypertension 10/18/2014  . Arthritis 11/24/2013  . Arthropathia 11/24/2013  . Obstipation 04/12/2013  . Migraine with status migrainosus 09/08/2012  . 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 Mild persistent asthma 05/26/2011  . Severe anxiety with panic 08/29/2006  . Panic disorder without agoraphobia 08/29/2006    Past Surgical History:  Procedure Laterality Date  . BREAST BIOPSY Left 8 years  . CESAREAN SECTION    . COLONOSCOPY    . COLONOSCOPY W/ BIOPSIES    . EAR TUBE REMOVAL    . ESOPHAGOGASTRODUODENOSCOPY    . LAPAROSCOPIC CHOLECYSTECTOMY  6/09   Dr. Hulen Skains  . TUBAL LIGATION  2017  . TYMPANOSTOMY TUBE PLACEMENT      Prior to Admission medications   Medication Sig Start Date End Date Taking? Authorizing Provider  albuterol (PROVENTIL HFA;VENTOLIN HFA) 108 (90 Base) MCG/ACT inhaler Inhale 2 puffs into the lungs every 6 (six) hours as needed for wheezing or shortness of breath. 04/16/16   Venia Carbon, MD  ALPRAZolam Duanne Moron) 0.25 MG tablet Take 1 tablet (0.25 mg total) by mouth 2 (two) times daily as needed for anxiety. 01/13/18   Copland, Frederico Hamman, MD  cetirizine (ZYRTEC) 10 MG tablet Take 10 mg by mouth daily.    [provider]  DULoxetine (CYMBALTA) 60 MG capsule Take 1 capsule (60 mg total) by mouth daily. 01/22/18  Venia Carbon, MD  Fluticasone-Salmeterol (ADVAIR) 250-50 MCG/DOSE AEPB Inhale 1 puff into the lungs 2 (two) times daily. 05/12/16   Venia Carbon, MD  meloxicam (MOBIC) 15 MG tablet Take 1 tablet (15 mg total) by mouth daily. 01/31/18   Cuthriell, Charline Bills, PA-C  methocarbamol (ROBAXIN) 500 MG tablet Take 1 tablet (500 mg total) by mouth 4 (four) times daily. 01/31/18   Cuthriell, Charline Bills, PA-C  mometasone (ELOCON) 0.1 % ointment APPLY TWICE DAILY AS NEEDED FOR PSORIASIS 06/03/17   Venia Carbon, MD  traMADol (ULTRAM) 50 MG tablet Take 1  tablet (50 mg total) by mouth every 6 (six) hours as needed for moderate pain or severe pain. 01/13/18 01/13/19  Owens Loffler, MD    Allergies Sulfa antibiotics; Ciprofloxacin; and Effexor [venlafaxine hcl]  Family History  Problem Relation Age of Onset  . Colon polyps Maternal Grandfather   . Parkinson's disease Maternal Grandfather   . Breast cancer Mother 5  . Rheum arthritis Mother   . Irritable bowel syndrome Mother   . Asthma Father   . Anxiety disorder Father        Severe, possible bipolar  . Irritable bowel syndrome Maternal Grandmother   . Diabetes Unknown        mat. great uncles  . Alzheimer's disease Unknown        Dad's side  . Colon cancer Unknown        mat GGF    Social History Social History   Tobacco Use  . Smoking status: Never Smoker  . Smokeless tobacco: Never Used  Substance Use Topics  . Alcohol use: No    Alcohol/week: 0.0 standard drinks  . Drug use: No     Review of Systems  Constitutional: No fever/chills Eyes: No visual changes. Cardiovascular: no chest pain. Respiratory: no cough. No SOB. Gastrointestinal: No abdominal pain.  No nausea, no vomiting.  No diarrhea.  No constipation. Genitourinary: Negative for dysuria. No hematuria Musculoskeletal: Positive for lower back pain.  No radiation.. Skin: Negative for rash, abrasions, lacerations, ecchymosis. Neurological: Negative for headaches, focal weakness or numbness. 10-point ROS otherwise negative.  ____________________________________________   PHYSICAL EXAM:  VITAL SIGNS: ED Triage Vitals  Enc Vitals Group     BP 01/31/18 1819 (!) 134/96     Pulse Rate 01/31/18 1819 (!) 101     Resp 01/31/18 1819 20     Temp 01/31/18 1819 98.2 F (36.8 C)     Temp Source 01/31/18 1819 Oral     SpO2 01/31/18 1819 99 %     Weight 01/31/18 1820 185 lb (83.9 kg)     Height 01/31/18 1820 _0  (1.626 m)     Head Circumference --      Peak Flow --      Pain Score 01/31/18 1820 8      Pain Loc --      Pain Edu? --      Excl. in [place]? --      Constitutional: Alert and oriented. Well appearing and in no acute distress. Eyes: Conjunctivae are normal. PERRL. EOMI. Head: Atraumatic. Neck: No stridor.  No cervical spine tenderness to palpation.  Cardiovascular: Normal rate, regular rhythm. Normal S1 and S2.  Good peripheral circulation. Respiratory: Normal respiratory effort without tachypnea or retractions. Lungs CTAB. Good air entry to the bases with no decreased or absent breath sounds. Musculoskeletal: Full range of motion to all extremities. No gross deformities appreciated.  Visualization reveals no visible abnormality.  Patient guarding movements of lower back.  Palpation reveals tenderness over the lower half of the lumbar spine midline, no specific point tenderness.  No palpable abnormality or deficit.  Patient is mildly tender to palpation of the right paraspinal muscle group with palpable spasms.  Patient is nontender to palpation left paraspinal muscle group.  No tenderness to palpation over either sciatic notch.  Negative straight leg raise bilaterally.  Dorsalis pedis pulse intact bilateral lower extremities.  Sensation intact and equal bilateral lower extremities. Neurologic:  Normal speech and language. No gross focal neurologic deficits are appreciated.  Skin:  Skin is warm, dry and intact. No rash noted. Psychiatric: Mood and affect are normal. Speech and behavior are normal. Patient exhibits appropriate insight and judgement.   ____________________________________________   LABS (all labs ordered are listed, but only abnormal results are displayed)  Labs Reviewed - No data to display ____________________________________________  EKG   ____________________________________________  RADIOLOGY I personally viewed and evaluated these images as part of my medical decision making, as well as reviewing the written report by the radiologist.  Dg Lumbar Spine  Complete  Result Date: 01/31/2018 CLINICAL DATA:  Low back pain after moving and clean things. EXAM: LUMBAR SPINE - COMPLETE 4+ VIEW COMPARISON:  None. FINDINGS: There is no evidence of lumbar spine fracture. Alignment is normal. Intervertebral disc spaces are maintained. Tubal ligation clips are noted. IMPRESSION: No acute abnormality. Electronically Signed   By: Abelardo Diesel M.D.   On: 01/31/2018 20:03    ____________________________________________    PROCEDURES  Procedure(s) performed:    Procedures    Medications  ketorolac (TORADOL) 30 MG/ML injection 30 mg (has no administration in time range)  orphenadrine (NORFLEX) injection 60 mg (has no administration in time range)  HYDROcodone-acetaminophen (NORCO/VICODIN) 5-325 MG per tablet 1 tablet (has no administration in time range)     ____________________________________________   INITIAL IMPRESSION / ASSESSMENT AND PLAN / ED COURSE  Pertinent labs & imaging results that were available during my care of the patient were reviewed by me and considered in my medical decision making (see chart for details).  Review of the [place] CSRS was performed in accordance of the [place] prior to dispensing any controlled drugs.      Patient's diagnosis is consistent with lumbar strain.  Patient presents the emergency department complaining of lower back pain after lifting items earlier today.  Exam was overall reassuring.  X-ray reveals no acute osseous abnormality.  Patient is given Toradol, Norflex injections in the emergency department and 1 Vicodin.. Patient will be discharged home with prescriptions for oxygen and Robaxin. Patient is to follow up with primary care as needed or otherwise directed. Patient is given ED precautions to return to the ED for any worsening or new symptoms.     ____________________________________________  FINAL CLINICAL IMPRESSION(S) / ED DIAGNOSES  Final diagnoses:  Strain of lumbar region, initial  encounter      NEW MEDICATIONS STARTED DURING THIS VISIT:  ED Discharge Orders         Ordered    meloxicam (MOBIC) 15 MG tablet  Daily     01/31/18 2018    methocarbamol (ROBAXIN) 500 MG tablet  4 times daily     01/31/18 2018              This chart was dictated using voice recognition software/Dragon. Despite best efforts to proofread, errors can occur which can change the meaning. Any change was purely unintentional.    Cuthriell, Roderic Palau  D, PA-C 01/31/18 2018    Arta Silence, MD 01/31/18 2211

## 2018-02-02 ENCOUNTER — Telehealth: Payer: Self-pay

## 2018-02-02 NOTE — Telephone Encounter (Signed)
Left message on VM per DPR asking pt to call us back to update [place] on how she is doing after recent ER visit.   PEC, if pt calls back, please document how she is doing. Thanks.

## 2018-02-10 ENCOUNTER — Other Ambulatory Visit: Payer: Self-pay | Admitting: Family Medicine

## 2018-02-10 MED ORDER — TRAMADOL HCL 50 MG PO TABS: 50.0000 mg | ORAL_TABLET | Freq: Four times a day (QID) | ORAL | 0 refills | Status: DC | PRN

## 2018-02-10 NOTE — Telephone Encounter (Signed)
Last office visit 01/22/2018 for Anxiety.  Last refilled 01/13/2018 for #30 with no refills.  Follow up scheduled 07/27/2018. UDS/Contract  10/28/2016.

## 2018-03-12 ENCOUNTER — Other Ambulatory Visit: Payer: Self-pay

## 2018-03-12 MED ORDER — TRAMADOL HCL 50 MG PO TABS: 50.0000 mg | ORAL_TABLET | Freq: Four times a day (QID) | ORAL | 0 refills | Status: DC | PRN

## 2018-03-12 NOTE — Telephone Encounter (Signed)
Last filled 02-10-18 #30 Last OV 01-22-18  Next OV 07-27-18  Forward to [facility] in Dr Alla German absence

## 2018-04-09 ENCOUNTER — Other Ambulatory Visit: Payer: Self-pay | Admitting: Internal Medicine

## 2018-04-09 ENCOUNTER — Other Ambulatory Visit: Payer: Self-pay | Admitting: Family Medicine

## 2018-04-09 MED ORDER — TRAMADOL HCL 50 MG PO TABS: 50.0000 mg | ORAL_TABLET | Freq: Four times a day (QID) | ORAL | 0 refills | Status: DC | PRN

## 2018-04-09 MED ORDER — ALPRAZOLAM 0.25 MG PO TABS: 0.2500 mg | ORAL_TABLET | Freq: Two times a day (BID) | ORAL | 0 refills | Status: DC | PRN

## 2018-04-09 NOTE — Telephone Encounter (Signed)
Last filled 03-12-18 #30 Last OV 01-22-18 Next OV 07-27-18

## 2018-04-09 NOTE — Telephone Encounter (Signed)
Last filled 01-13-18 #20 Last OV 01-22-18 Next OV 07-27-18

## 2018-05-11 ENCOUNTER — Other Ambulatory Visit: Payer: Self-pay

## 2018-05-11 MED ORDER — TRAMADOL HCL 50 MG PO TABS: 50.0000 mg | ORAL_TABLET | Freq: Four times a day (QID) | ORAL | 0 refills | Status: DC | PRN

## 2018-05-11 NOTE — Telephone Encounter (Signed)
Last Filled 04-09-18 #30 Last OV 01-22-18 Next OV 07-27-18 [facility]

## 2018-06-07 ENCOUNTER — Other Ambulatory Visit: Payer: Self-pay

## 2018-06-07 MED ORDER — TRAMADOL HCL 50 MG PO TABS: 50.0000 mg | ORAL_TABLET | Freq: Four times a day (QID) | ORAL | 0 refills | Status: DC | PRN

## 2018-06-07 MED ORDER — ALPRAZOLAM 0.25 MG PO TABS: 0.2500 mg | ORAL_TABLET | Freq: Two times a day (BID) | ORAL | 0 refills | Status: DC | PRN

## 2018-06-07 NOTE — Telephone Encounter (Signed)
Last office vsiit 01/22/2018 6 month follow up.  Last refilled Alprazolam 04/09/2018 for #20 with no refills.  Tramadol 05/11/2018 for #30 with no refills.  Next Appt: 07/27/2018 with Dr. Silvio Pate.  UDS/Contract 10/28/2016.  Ok to refill?

## 2018-07-05 ENCOUNTER — Other Ambulatory Visit: Payer: Self-pay | Admitting: Internal Medicine

## 2018-07-05 MED ORDER — TRAMADOL HCL 50 MG PO TABS: 50.0000 mg | ORAL_TABLET | Freq: Four times a day (QID) | ORAL | 0 refills | Status: DC | PRN

## 2018-07-05 NOTE — Telephone Encounter (Signed)
CSRS report placed in Dr Alla German inbox on his desk

## 2018-07-05 NOTE — Telephone Encounter (Signed)
Last filled 06-07-18 #30 Last OV 01-22-18 Next OV 07-27-18 [facility]

## 2018-07-05 NOTE — Telephone Encounter (Signed)
Please check CSRS

## 2018-07-05 NOTE — Telephone Encounter (Signed)
No concerns Only Rx other than here is for several doses of narcotic cough syrup over the past 1-2 years

## 2018-07-06 ENCOUNTER — Encounter: Payer: Self-pay | Admitting: Family Medicine

## 2018-07-06 ENCOUNTER — Ambulatory Visit (INDEPENDENT_AMBULATORY_CARE_PROVIDER_SITE_OTHER): Payer: Medicaid Other | Admitting: Family Medicine

## 2018-07-06 VITALS — BP 130/108 | HR 114 | Temp 99.1°F | Resp 12 | Ht 64.0 in | Wt 189.5 lb

## 2018-07-06 DIAGNOSIS — J189 Pneumonia, unspecified organism: Secondary | ICD-10-CM

## 2018-07-06 DIAGNOSIS — R030 Elevated blood-pressure reading, without diagnosis of hypertension: Secondary | ICD-10-CM

## 2018-07-06 MED ORDER — HYDROCOD POLST-CPM POLST ER 10-8 MG/5ML PO SUER: 5.0000 mL | Freq: Two times a day (BID) | ORAL | 0 refills | Status: DC | PRN

## 2018-07-06 MED ORDER — AMOXICILLIN-POT CLAVULANATE 875-125 MG PO TABS: 1.0000 | ORAL_TABLET | Freq: Two times a day (BID) | ORAL | 0 refills | Status: AC

## 2018-07-06 NOTE — Patient Instructions (Addendum)
Continue the antibiotics and the prednisone Start the other antibiotic to take in addition to the medicine you are already taking     Your blood pressure was a little high today and looks like it has been high in several office visits.   You should make an appointment to come back in about 1-2 weeks when you are feeling better for a blood pressure check.   Or check your blood pressure at home (or the pharmacy) and call us in a few weeks to let Dr. Silvio Pate know what your blood pressure is doing  The cough may linger for 6 weeks   1. Drink plenty of fluids 2. Get lots of rest  Sinus Congestion 1) Neti Pot (Saline rinse) -- 2 times day -- if tolerated 2) Flonase (Store Brand ok) - once daily 3) Over the counter congestion medications  Cough 1) Cough drops can be helpful 2) Nyquil (or nighttime cough medication) 3) Honey is proven to be one of the best cough medications   Sore Throat 1) Honey as above, cough drops 2) Ibuprofen or Aleve can be helpful 3) Salt water Gargles  If you develop fevers (Temperature >100.4), chills, worsening symptoms or symptoms lasting longer than 10 days return to clinic.

## 2018-07-06 NOTE — Progress Notes (Signed)
Subjective:     Bridget Maldonado is a 38 y.o. female presenting for Cough (went to [facility] urgent care on 07/04/2018. Flu test was negative. Patient is having fever now-last night was almost 102, this morning was between 99 and 100, upset stomach, chest and back hurting. Feels worse since been seen on 07/04/2018. Patient is taking Prednisone and Doxy. )     Cough  This is a new problem. The current episode started in the past 7 days. The problem has been gradually worsening. The cough is productive of purulent sputum and productive of sputum. Associated symptoms include chest pain, a fever, headaches and rhinorrhea. Pertinent negatives include no chills, postnasal drip or sore throat. The symptoms are aggravated by lying down. She has tried OTC cough suppressant (doxycycline and prednisone) for the symptoms. The treatment provided no relief. Her past medical history is significant for pneumonia.    Daughter was sick first - and has been coughing on her Pneumonia a few months ago  "feel like I've been hit by a bus"  Using albuterol 2 times a day - heart palpitations if she takes it more often   Review of Systems  Constitutional: Positive for fever. Negative for chills.  HENT: Positive for congestion and rhinorrhea. Negative for postnasal drip and sore throat.   Eyes: Negative for visual disturbance.  Respiratory: Positive for cough and chest tightness.   Cardiovascular: Positive for chest pain.  Gastrointestinal: Positive for abdominal pain, diarrhea, nausea and vomiting.  Musculoskeletal: Positive for back pain.  Neurological: Positive for headaches.     Social History   Tobacco Use  Smoking Status Never Smoker  Smokeless Tobacco Never Used        Objective:    BP Readings from Last 3 Encounters:  07/06/18 (!) 130/108  01/31/18 (!) 134/96  01/22/18 124/86   Wt Readings from Last 3 Encounters:  07/06/18 189 lb 8 oz (86 kg)  01/31/18 185 lb (83.9 kg)  01/22/18 188 lb (85.3  kg)    BP (!) 130/108   Pulse (!) 114   Temp 99.1 F (37.3 C)   Resp 12   Ht 5\' 4"  (1.626 m)   Wt 189 lb 8 oz (86 kg)   SpO2 96%   BMI 32.53 kg/m   Repeat BP 126/90  Physical Exam Constitutional:      General: She is not in acute distress.    Appearance: She is well-developed. She is ill-appearing. She is not diaphoretic.  HENT:     Head: Normocephalic and atraumatic.     Right Ear: Tympanic membrane and ear canal normal.     Left Ear: Tympanic membrane and ear canal normal.     Nose: Mucosal edema and rhinorrhea present.     Right Sinus: No maxillary sinus tenderness or frontal sinus tenderness.     Left Sinus: No maxillary sinus tenderness or frontal sinus tenderness.     Mouth/Throat:     Pharynx: Uvula midline. Posterior oropharyngeal erythema present. No oropharyngeal exudate.     Tonsils: Swelling: 0 on the right. 0 on the left.  Eyes:     General: No scleral icterus.    Conjunctiva/sclera: Conjunctivae normal.  Neck:     Musculoskeletal: Neck supple.  Cardiovascular:     Rate and Rhythm: Normal rate and regular rhythm.     Heart sounds: Normal heart sounds. No murmur.  Pulmonary:     Effort: Pulmonary effort is normal. No respiratory distress.     Breath  sounds: Wheezing (diffuse) and rhonchi present.  Lymphadenopathy:     Cervical: No cervical adenopathy.  Skin:    General: Skin is warm and dry.     Capillary Refill: Capillary refill takes less than 2 seconds.  Neurological:     Mental Status: She is alert.  Psychiatric:        Mood and Affect: Mood normal.        Behavior: Behavior normal.           Assessment & Plan:   Problem List Items Addressed This Visit    None    Visit Diagnoses    Pneumonia due to infectious organism, unspecified laterality, unspecified part of lung    -  Primary   Relevant Medications   doxycycline (VIBRAMYCIN) 100 MG capsule   amoxicillin-clavulanate (AUGMENTIN) 875-125 MG tablet   chlorpheniramine-HYDROcodone  (TUSSIONEX) 10-8 MG/5ML SUER   Elevated blood pressure reading in office without diagnosis of hypertension         Given no improvement on doxycycline alone will add amoxicillin given asthma history and concern for pneumonia with persistent fevers on antibiotics.   BP elevated. Reported follow-up scheduled with pcp in February will route to PCP as FYI  Return in about 1 week (around 07/13/2018) for bp check.  Lesleigh Noe, MD

## 2018-07-15 ENCOUNTER — Telehealth: Payer: Self-pay

## 2018-07-15 NOTE — Telephone Encounter (Signed)
[place] [employer] form filled out and faxed back. They have a tendency to not let us know if they are approved or denied.

## 2018-07-26 ENCOUNTER — Other Ambulatory Visit: Payer: Self-pay | Admitting: Internal Medicine

## 2018-07-26 DIAGNOSIS — Z1231 Encounter for screening mammogram for malignant neoplasm of breast: Secondary | ICD-10-CM

## 2018-07-27 ENCOUNTER — Encounter: Payer: Self-pay | Admitting: Internal Medicine

## 2018-07-27 ENCOUNTER — Ambulatory Visit: Payer: Medicaid Other | Admitting: Internal Medicine

## 2018-07-27 VITALS — BP 110/86 | HR 85 | Temp 97.8°F | Ht 64.0 in | Wt 189.0 lb

## 2018-07-27 DIAGNOSIS — F39 Unspecified mood [affective] disorder: Secondary | ICD-10-CM | POA: Diagnosis not present

## 2018-07-27 DIAGNOSIS — F319 Bipolar disorder, unspecified: Secondary | ICD-10-CM | POA: Insufficient documentation

## 2018-07-27 MED ORDER — ALPRAZOLAM 0.25 MG PO TABS: 0.2500 mg | ORAL_TABLET | Freq: Two times a day (BID) | ORAL | 0 refills | Status: DC | PRN

## 2018-07-27 MED ORDER — ARIPIPRAZOLE 5 MG PO TABS: 5.0000 mg | ORAL_TABLET | Freq: Every day | ORAL | 1 refills | Status: DC

## 2018-07-27 NOTE — Progress Notes (Signed)
Subjective:    Patient ID: Bridget Maldonado, female    DOB: 1980-09-16, 38 y.o.   MRN: [id number]  HPI Here for follow up of mood issues Is better from the pneumonia---some cough but fever is gone Breathing is back to normal  Mood is some better Is on the higher duloxetine dose Stuck at home ---"zero desire to do anything" Feels overwhelmed Waiting for contract to be renewed--so waiting about starting up again Doing okay financially for now 31 year old daughter is not a problem Does note some degree of depression but not severe  Will get panicky type feelings--- "I don't know why" Daily anxiety Gets angry easily No manic feelings Ran out of xanax--- had to use 2 at a time for any effect  No thoughts of suicide  Current Outpatient Medications on File Prior to Visit  Medication Sig Dispense Refill  . albuterol (PROVENTIL HFA;VENTOLIN HFA) 108 (90 Base) MCG/ACT inhaler Inhale 2 puffs into the lungs every 6 (six) hours as needed for wheezing or shortness of breath. 1 Inhaler 1  . ALPRAZolam (XANAX) 0.25 MG tablet Take 1 tablet (0.25 mg total) by mouth 2 (two) times daily as needed for anxiety. 20 tablet 0  . cetirizine (ZYRTEC) 10 MG tablet Take 10 mg by mouth daily.    . DULoxetine (CYMBALTA) 60 MG capsule Take 1 capsule (60 mg total) by mouth daily. 90 capsule 3  . Fluticasone-Salmeterol (ADVAIR) 250-50 MCG/DOSE AEPB Inhale 1 puff into the lungs 2 (two) times daily. 60 each 5  . meloxicam (MOBIC) 15 MG tablet Take 1 tablet (15 mg total) by mouth daily. 30 tablet 0  . mometasone (ELOCON) 0.1 % ointment APPLY TWICE DAILY AS NEEDED FOR PSORIASIS 45 g 1  . traMADol (ULTRAM) 50 MG tablet Take 1 tablet (50 mg total) by mouth every 6 (six) hours as needed for moderate pain or severe pain. 30 tablet 0   No current facility-administered medications on file prior to visit.     Allergies  Allergen Reactions  . Sulfa Antibiotics Nausea And Vomiting  . Ciprofloxacin Nausea And Vomiting  .  Effexor [Venlafaxine Hcl] Other (See Comments)    Reaction:  Made pt sleep for two days straight.    Past Medical History:  Diagnosis Date  . Anxiety    Possible bipolar, Type II  . Arthritis   . Asthma   . Bipolar disorder ([place])   . BRCA negative 07/2017   vistaseq neg  . Chronic headaches   . Endometriosis 2018  . Family history of breast cancer   . Hemorrhoids 08/2007  . IBS (irritable bowel syndrome) 08/2007  . 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 Increased risk of breast cancer 07/2017   IBIS=40%  . Kidney infection   . LGSIL on Pap smear of cervix   . Migraines   . Multiple allergies   . Obese   . Psoriasis   . Psoriatic arthritis [facility])     Past Surgical History:  Procedure Laterality Date  . BREAST BIOPSY Left 8 years  . CESAREAN SECTION    . COLONOSCOPY    . COLONOSCOPY W/ BIOPSIES    . EAR TUBE REMOVAL    . ESOPHAGOGASTRODUODENOSCOPY    . LAPAROSCOPIC CHOLECYSTECTOMY  6/09   Dr. Hulen Skains  . TUBAL LIGATION  2017  . TYMPANOSTOMY TUBE PLACEMENT      Family History  Problem Relation Age of Onset  . Colon polyps Maternal Grandfather   . Parkinson's disease Maternal Grandfather   .  Breast cancer Mother 66  . Rheum arthritis Mother   . Irritable bowel syndrome Mother   . Asthma Father   . Anxiety disorder Father        Severe, possible bipolar  . Irritable bowel syndrome Maternal Grandmother   . Diabetes Unknown        mat. great uncles  . Alzheimer's disease Unknown        Dad's side  . Colon cancer Unknown        mat GGF    Social History   Socioeconomic History  . Marital status: Single    Spouse name: Not on file  . Number of children: 1  . Years of education: Not on file  . Highest education level: Not on file  Occupational History  . Occupation: Herbalist    Comment: [place]  . Financial resource strain: Not on file  . Food insecurity:    Worry: Not on file    Inability: Not on file  .  Transportation needs:    Medical: Not on file    Non-medical: Not on file  Tobacco Use  . Smoking status: Never Smoker  . Smokeless tobacco: Never Used  Substance and Sexual Activity  . Alcohol use: No    Alcohol/week: 0.0 standard drinks  . Drug use: No  . Sexual activity: Yes    Birth control/protection: Surgical  Lifestyle  . Physical activity:    Days per week: Not on file    Minutes per session: Not on file  . Stress: Not on file  Relationships  . Social connections:    Talks on phone: Not on file    Gets together: Not on file    Attends religious service: Not on file    Active member of club or organization: Not on file    Attends meetings of clubs or organizations: Not on file    Relationship status: Not on file  . Intimate partner violence:    Fear of current or ex partner: Not on file    Emotionally abused: Not on file    Physically abused: Not on file    Forced sexual activity: Not on file  Other Topics Concern  . Not on file  Social History Narrative  . Not on file   Review of Systems  Appetite is not good Weight is down a little Not sleeping well     Objective:   Physical Exam  Constitutional: No distress.  Psychiatric:  No mania Not clearly depressed Normal speech pattern and appearance           Assessment & Plan:

## 2018-07-27 NOTE — Assessment & Plan Note (Signed)
Bad anxiety Irritability and anger may be part of bipolar disorder (and there is FH) Will add abilify at bedtime

## 2018-08-16 ENCOUNTER — Ambulatory Visit: Payer: Medicaid Other | Admitting: Internal Medicine

## 2018-08-18 ENCOUNTER — Other Ambulatory Visit: Payer: Self-pay

## 2018-08-18 MED ORDER — TRAMADOL HCL 50 MG PO TABS: 50.0000 mg | ORAL_TABLET | Freq: Four times a day (QID) | ORAL | 0 refills | Status: DC | PRN

## 2018-08-18 NOTE — Telephone Encounter (Signed)
Last filled 07-05-18 #30 Last OV 07-27-18 No Future [place]

## 2018-09-03 ENCOUNTER — Ambulatory Visit: Payer: Medicaid Other | Admitting: Internal Medicine

## 2018-09-06 ENCOUNTER — Ambulatory Visit: Payer: Medicaid Other | Admitting: Internal Medicine

## 2018-09-09 ENCOUNTER — Inpatient Hospital Stay: Admission: RE | Admit: 2018-09-09 | Payer: Medicaid Other | Source: Ambulatory Visit

## 2018-09-10 ENCOUNTER — Other Ambulatory Visit: Payer: Self-pay

## 2018-09-10 MED ORDER — ALPRAZOLAM 0.25 MG PO TABS: 0.2500 mg | ORAL_TABLET | Freq: Two times a day (BID) | ORAL | 0 refills | Status: DC | PRN

## 2018-09-10 NOTE — Telephone Encounter (Signed)
Last filled 07-27-18 #60 Last OV 07-27-18 Next OV 10-15-18 [place]

## 2018-09-17 ENCOUNTER — Other Ambulatory Visit: Payer: Self-pay

## 2018-09-17 MED ORDER — TRAMADOL HCL 50 MG PO TABS: 50.0000 mg | ORAL_TABLET | Freq: Four times a day (QID) | ORAL | 0 refills | Status: DC | PRN

## 2018-09-17 NOTE — Telephone Encounter (Signed)
Last office visit 07/27/2018 for Mood Disorder.  Last refilled 08/18/2018 for #30 with no refills.  UDS/Contract 10/28/2016.  Next Appt: 10/15/2018 for 2 month follow up.

## 2018-10-15 ENCOUNTER — Encounter: Payer: Self-pay | Admitting: Internal Medicine

## 2018-10-15 ENCOUNTER — Telehealth: Payer: Self-pay | Admitting: Internal Medicine

## 2018-10-15 ENCOUNTER — Other Ambulatory Visit: Payer: Self-pay

## 2018-10-15 ENCOUNTER — Ambulatory Visit (INDEPENDENT_AMBULATORY_CARE_PROVIDER_SITE_OTHER): Payer: Medicaid Other | Admitting: Internal Medicine

## 2018-10-15 DIAGNOSIS — M545 Low back pain, unspecified: Secondary | ICD-10-CM

## 2018-10-15 DIAGNOSIS — G8929 Other chronic pain: Secondary | ICD-10-CM

## 2018-10-15 DIAGNOSIS — F319 Bipolar disorder, unspecified: Secondary | ICD-10-CM | POA: Diagnosis not present

## 2018-10-15 MED ORDER — ARIPIPRAZOLE 5 MG PO TABS: 5.0000 mg | ORAL_TABLET | Freq: Every day | ORAL | 3 refills | Status: DC

## 2018-10-15 MED ORDER — MOMETASONE FUROATE 0.1 % EX OINT: TOPICAL_OINTMENT | CUTANEOUS | 1 refills | Status: DC

## 2018-10-15 MED ORDER — TRAMADOL HCL 50 MG PO TABS: 50.0000 mg | ORAL_TABLET | Freq: Four times a day (QID) | ORAL | 0 refills | Status: DC | PRN

## 2018-10-15 NOTE — Telephone Encounter (Signed)
I left a message on patient's voice mail to call back and schedule 6 month follow up with Dr.Letvak in October.

## 2018-10-15 NOTE — Progress Notes (Signed)
Subjective:    Patient ID: Bridget Maldonado, female    DOB: 05-12-1981, 38 y.o.   MRN: [id number]  HPI Virtual visit for follow up of mood issues----possible bipolar disorder Identification done Reviewed billing and she gave consent She is home and I am in my office  She is satisfied with the abilify No apparent side effects Not feeling depressed  Still gets irritated---but it takes a whole lot more Doesn't fire up so easily Parents had noticed her more relaxed on the medication Tough being home Hasn't been able to renew contract with [facility] (billing capacity) Doing okay with her 17 year old daughter  Current Outpatient Medications on File Prior to Visit  Medication Sig Dispense Refill  . albuterol (PROVENTIL HFA;VENTOLIN HFA) 108 (90 Base) MCG/ACT inhaler Inhale 2 puffs into the lungs every 6 (six) hours as needed for wheezing or shortness of breath. 1 Inhaler 1  . ALPRAZolam (XANAX) 0.25 MG tablet Take 1 tablet (0.25 mg total) by mouth 2 (two) times daily as needed for anxiety. 60 tablet 0  . ARIPiprazole (ABILIFY) 5 MG tablet Take 1 tablet (5 mg total) by mouth at bedtime. 30 tablet 1  . cetirizine (ZYRTEC) 10 MG tablet Take 10 mg by mouth daily.    . Fluticasone-Salmeterol (ADVAIR) 250-50 MCG/DOSE AEPB Inhale 1 puff into the lungs 2 (two) times daily. 60 each 5  . meloxicam (MOBIC) 15 MG tablet Take 1 tablet (15 mg total) by mouth daily. 30 tablet 0  . mometasone (ELOCON) 0.1 % ointment APPLY TWICE DAILY AS NEEDED FOR PSORIASIS 45 g 1  . traMADol (ULTRAM) 50 MG tablet Take 1 tablet (50 mg total) by mouth every 6 (six) hours as needed for moderate pain or severe pain. 30 tablet 0  . DULoxetine (CYMBALTA) 60 MG capsule Take 1 capsule (60 mg total) by mouth daily. (Patient not taking: Reported on 10/15/2018) 90 capsule 3   No current facility-administered medications on file prior to visit.     Allergies  Allergen Reactions  . Sulfa Antibiotics Nausea And Vomiting  .  Ciprofloxacin Nausea And Vomiting  . Effexor [Venlafaxine Hcl] Other (See Comments)    Reaction:  Made pt sleep for two days straight.    Past Medical History:  Diagnosis Date  . Anxiety    Possible bipolar, Type II  . Arthritis   . Asthma   . Bipolar disorder ([place])   . BRCA negative 07/2017   vistaseq neg  . Chronic headaches   . Endometriosis 2018  . Family history of breast cancer   . Hemorrhoids 08/2007  . IBS (irritable bowel syndrome) 08/2007  . 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 Increased risk of breast cancer 07/2017   IBIS=40%  . Kidney infection   . LGSIL on Pap smear of cervix   . Migraines   . Multiple allergies   . Obese   . Psoriasis   . Psoriatic arthritis [facility])     Past Surgical History:  Procedure Laterality Date  . BREAST BIOPSY Left 8 years  . CESAREAN SECTION    . COLONOSCOPY    . COLONOSCOPY W/ BIOPSIES    . EAR TUBE REMOVAL    . ESOPHAGOGASTRODUODENOSCOPY    . LAPAROSCOPIC CHOLECYSTECTOMY  6/09   Dr. Hulen Skains  . TUBAL LIGATION  2017  . TYMPANOSTOMY TUBE PLACEMENT      Family History  Problem Relation Age of Onset  . Colon polyps Maternal Grandfather   . Parkinson's disease Maternal Grandfather   .  Breast cancer Mother 53  . Rheum arthritis Mother   . Irritable bowel syndrome Mother   . Asthma Father   . Anxiety disorder Father        Severe, possible bipolar  . Irritable bowel syndrome Maternal Grandmother   . Diabetes Unknown        mat. great uncles  . Alzheimer's disease Unknown        Dad's side  . Colon cancer Unknown        mat GGF    Social History   Socioeconomic History  . Marital status: Single    Spouse name: Not on file  . Number of children: 1  . Years of education: Not on file  . Highest education level: Not on file  Occupational History  . Occupation: Herbalist    Comment: [place]  . Financial resource strain: Not on file  . Food insecurity:    Worry: Not on file     Inability: Not on file  . Transportation needs:    Medical: Not on file    Non-medical: Not on file  Tobacco Use  . Smoking status: Never Smoker  . Smokeless tobacco: Never Used  Substance and Sexual Activity  . Alcohol use: No    Alcohol/week: 0.0 standard drinks  . Drug use: No  . Sexual activity: Yes    Birth control/protection: Surgical  Lifestyle  . Physical activity:    Days per week: Not on file    Minutes per session: Not on file  . Stress: Not on file  Relationships  . Social connections:    Talks on phone: Not on file    Gets together: Not on file    Attends religious service: Not on file    Active member of club or organization: Not on file    Attends meetings of clubs or organizations: Not on file    Relationship status: Not on file  . Intimate partner violence:    Fear of current or ex partner: Not on file    Emotionally abused: Not on file    Physically abused: Not on file    Forced sexual activity: Not on file  Other Topics Concern  . Not on file  Social History Narrative  . Not on file   Review of Systems No increase in appetite Weight stable Sleeping well More flares of the psoriatic arthritis---plans to connect with Dr Meda Coffee (rheumatologist)    Objective:   Physical Exam  Constitutional: She appears well-developed. No distress.  Psychiatric:  Actually upbeat and smiling Easily dealing with 38 year old in the background, etc Affect appropriate           Assessment & Plan:

## 2018-10-15 NOTE — Assessment & Plan Note (Signed)
Ongoing issues Has to get back with the rheumatologist Will renew the tramadol

## 2018-10-15 NOTE — Assessment & Plan Note (Signed)
Atypical with more irritability, anger and lability Has responded really well to the abilify----calmer, not as anxious and the labile anger is mostly gone No increased appetite or weight.  Asked her to check sugar (dad has testing equipment) every 2 months or so Counseling---working on job, etc--but doing okay with the isolation

## 2018-10-26 ENCOUNTER — Ambulatory Visit: Payer: Medicaid Other

## 2018-11-11 ENCOUNTER — Other Ambulatory Visit: Payer: Self-pay

## 2018-11-11 MED ORDER — TRAMADOL HCL 50 MG PO TABS: 50.0000 mg | ORAL_TABLET | Freq: Four times a day (QID) | ORAL | 0 refills | Status: DC | PRN

## 2018-11-11 NOTE — Telephone Encounter (Signed)
Letvak pt, TBF on or after 11/14/18... please advise

## 2018-12-14 ENCOUNTER — Other Ambulatory Visit: Payer: Self-pay

## 2018-12-14 ENCOUNTER — Ambulatory Visit
Admission: RE | Admit: 2018-12-14 | Discharge: 2018-12-14 | Disposition: A | Discharge status: Home / Self Care | Payer: Medicaid Other | Source: Ambulatory Visit | Attending: Internal Medicine | Admitting: Internal Medicine

## 2018-12-14 DIAGNOSIS — Z1231 Encounter for screening mammogram for malignant neoplasm of breast: Secondary | ICD-10-CM

## 2018-12-16 ENCOUNTER — Other Ambulatory Visit: Payer: Self-pay | Admitting: Internal Medicine

## 2018-12-16 DIAGNOSIS — N644 Mastodynia: Secondary | ICD-10-CM

## 2018-12-21 ENCOUNTER — Other Ambulatory Visit: Payer: Self-pay | Admitting: Internal Medicine

## 2018-12-23 ENCOUNTER — Other Ambulatory Visit: Payer: Self-pay | Admitting: Internal Medicine

## 2018-12-23 MED ORDER — TRAMADOL HCL 50 MG PO TABS: 50.0000 mg | ORAL_TABLET | Freq: Four times a day (QID) | ORAL | 0 refills | Status: DC | PRN

## 2018-12-23 NOTE — Telephone Encounter (Signed)
Last filled 11-11-18 #30 Last OV 10-15-18 Next OV 04-15-19 [place]

## 2018-12-27 ENCOUNTER — Ambulatory Visit: Payer: Medicaid Other

## 2018-12-27 ENCOUNTER — Other Ambulatory Visit: Payer: Self-pay

## 2018-12-27 ENCOUNTER — Ambulatory Visit
Admission: RE | Admit: 2018-12-27 | Discharge: 2018-12-27 | Disposition: A | Discharge status: Home / Self Care | Payer: Medicaid Other | Source: Ambulatory Visit | Attending: Internal Medicine | Admitting: Internal Medicine

## 2018-12-27 DIAGNOSIS — N644 Mastodynia: Secondary | ICD-10-CM

## 2018-12-28 ENCOUNTER — Other Ambulatory Visit: Payer: Self-pay

## 2019-01-04 MED ORDER — FLUTICASONE-SALMETEROL 250-50 MCG/DOSE IN AEPB: 1.0000 | INHALATION_SPRAY | Freq: Two times a day (BID) | RESPIRATORY_TRACT | 5 refills | Status: DC

## 2019-01-04 NOTE — Telephone Encounter (Signed)
Last refilled in 2017

## 2019-01-24 ENCOUNTER — Other Ambulatory Visit: Payer: Self-pay

## 2019-01-24 MED ORDER — TRAMADOL HCL 50 MG PO TABS: 50.0000 mg | ORAL_TABLET | Freq: Four times a day (QID) | ORAL | 0 refills | Status: DC | PRN

## 2019-01-24 NOTE — Telephone Encounter (Signed)
Last written 12-23-18 #30 Last OV 10-15-18 Next OV 04-15-19 [place]

## 2019-02-24 ENCOUNTER — Other Ambulatory Visit: Payer: Self-pay

## 2019-02-24 MED ORDER — TRAMADOL HCL 50 MG PO TABS: 50.0000 mg | ORAL_TABLET | Freq: Four times a day (QID) | ORAL | 0 refills | Status: DC | PRN

## 2019-02-24 NOTE — Telephone Encounter (Signed)
Last filled 01-24-19 #30 Last OV 10-15-18 Next OV 04-15-19 [place]

## 2019-03-09 ENCOUNTER — Other Ambulatory Visit: Payer: Self-pay

## 2019-03-10 MED ORDER — ALPRAZOLAM 0.25 MG PO TABS: 0.2500 mg | ORAL_TABLET | Freq: Two times a day (BID) | ORAL | 0 refills | Status: DC | PRN

## 2019-03-10 NOTE — Telephone Encounter (Signed)
Last filled 09-10-18 #60 Last OV 10-18-18 Next OV 04-15-19 [place]

## 2019-03-24 ENCOUNTER — Emergency Department: Payer: Medicaid Other

## 2019-03-24 ENCOUNTER — Encounter: Payer: Self-pay | Admitting: Emergency Medicine

## 2019-03-24 ENCOUNTER — Other Ambulatory Visit: Payer: Self-pay

## 2019-03-24 ENCOUNTER — Emergency Department
Admission: EM | Admit: 2019-03-24 | Discharge: 2019-03-24 | Disposition: A | Discharge status: Home / Self Care | Payer: Medicaid Other | Attending: Emergency Medicine | Admitting: Emergency Medicine

## 2019-03-24 ENCOUNTER — Ambulatory Visit
Admission: EM | Admit: 2019-03-24 | Discharge: 2019-03-24 | Disposition: A | Discharge status: Home / Self Care | Payer: Medicaid Other | Attending: Internal Medicine | Admitting: Internal Medicine

## 2019-03-24 DIAGNOSIS — R079 Chest pain, unspecified: Secondary | ICD-10-CM | POA: Diagnosis present

## 2019-03-24 DIAGNOSIS — R109 Unspecified abdominal pain: Secondary | ICD-10-CM | POA: Diagnosis not present

## 2019-03-24 DIAGNOSIS — I10 Essential (primary) hypertension: Secondary | ICD-10-CM | POA: Insufficient documentation

## 2019-03-24 DIAGNOSIS — J45909 Unspecified asthma, uncomplicated: Secondary | ICD-10-CM | POA: Diagnosis not present

## 2019-03-24 DIAGNOSIS — M5489 Other dorsalgia: Secondary | ICD-10-CM | POA: Diagnosis not present

## 2019-03-24 DIAGNOSIS — G43009 Migraine without aura, not intractable, without status migrainosus: Secondary | ICD-10-CM

## 2019-03-24 DIAGNOSIS — G43019 Migraine without aura, intractable, without status migrainosus: Secondary | ICD-10-CM

## 2019-03-24 LAB — CBC WITH DIFFERENTIAL/PLATELET
Abs Immature Granulocytes: 0.02 10*3/uL (ref 0.00–0.07)
Basophils Absolute: 0.1 10*3/uL (ref 0.0–0.1)
Basophils Relative: 1 %
Eosinophils Absolute: 0.3 10*3/uL (ref 0.0–0.5)
Eosinophils Relative: 5 %
HCT: 42.9 % (ref 36.0–46.0)
Hemoglobin: 14.3 g/dL (ref 12.0–15.0)
Immature Granulocytes: 0 %
Lymphocytes Relative: 47 %
Lymphs Abs: 2.9 10*3/uL (ref 0.7–4.0)
MCH: 28.4 pg (ref 26.0–34.0)
MCHC: 33.3 g/dL (ref 30.0–36.0)
MCV: 85.3 fL (ref 80.0–100.0)
Monocytes Absolute: 0.3 10*3/uL (ref 0.1–1.0)
Monocytes Relative: 4 %
Neutro Abs: 2.7 10*3/uL (ref 1.7–7.7)
Neutrophils Relative %: 43 %
Platelets: 253 10*3/uL (ref 150–400)
RBC: 5.03 MIL/uL (ref 3.87–5.11)
RDW: 13 % (ref 11.5–15.5)
WBC: 6.2 10*3/uL (ref 4.0–10.5)
nRBC: 0 % (ref 0.0–0.2)

## 2019-03-24 LAB — BASIC METABOLIC PANEL
Anion gap: 6 (ref 5–15)
BUN: 20 mg/dL (ref 6–20)
CO2: 24 mmol/L (ref 22–32)
Calcium: 9 mg/dL (ref 8.9–10.3)
Chloride: 107 mmol/L (ref 98–111)
Creatinine, Ser: 0.72 mg/dL (ref 0.44–1.00)
GFR calc Af Amer: >60 mL/min (ref >60)
GFR calc non Af Amer: >60 mL/min (ref >60)
Glucose, Bld: 95 mg/dL (ref 70–99)
Potassium: 3.8 mmol/L (ref 3.5–5.1)
Sodium: 137 mmol/L (ref 135–145)

## 2019-03-24 LAB — TROPONIN I (HIGH SENSITIVITY)
Troponin I (High Sensitivity): <2 ng/L (ref <18)
Troponin I (High Sensitivity): <2 ng/L (ref <18)

## 2019-03-24 MED ORDER — ZOLPIDEM TARTRATE 10 MG PO TABS: 10.0000 mg | ORAL_TABLET | Freq: Every evening | ORAL | 0 refills | Status: DC | PRN

## 2019-03-24 MED ORDER — SUMATRIPTAN SUCCINATE 6 MG/0.5ML ~~LOC~~ SOLN
6.0000 mg | Freq: Once | SUBCUTANEOUS | Status: AC
  Administered 2019-03-24: 6 mg via SUBCUTANEOUS

## 2019-03-24 MED ORDER — IOHEXOL 350 MG/ML SOLN
100.0000 mL | Freq: Once | INTRAVENOUS | Status: AC | PRN
  Administered 2019-03-24: 100 mL via INTRAVENOUS

## 2019-03-24 MED ORDER — ESOMEPRAZOLE MAGNESIUM 40 MG PO CPDR: 40.0000 mg | DELAYED_RELEASE_CAPSULE | Freq: Every day | ORAL | 1 refills | Status: DC

## 2019-03-24 MED ORDER — METOCLOPRAMIDE HCL 5 MG/ML IJ SOLN
10.0000 mg | Freq: Once | INTRAMUSCULAR | Status: AC
  Administered 2019-03-24: 10 mg via INTRAMUSCULAR

## 2019-03-24 MED ORDER — DEXAMETHASONE SODIUM PHOSPHATE 10 MG/ML IJ SOLN
10.0000 mg | Freq: Once | INTRAMUSCULAR | Status: AC
  Administered 2019-03-24: 10 mg via INTRAVENOUS

## 2019-03-24 MED ORDER — DIAZEPAM 5 MG PO TABS: 5.0000 mg | ORAL_TABLET | Freq: Three times a day (TID) | ORAL | 0 refills | Status: DC | PRN

## 2019-03-24 MED ORDER — LORAZEPAM 2 MG/ML IJ SOLN
1.0000 mg | Freq: Once | INTRAMUSCULAR | Status: AC
  Administered 2019-03-24: 11:00:00 1 mg via INTRAVENOUS
  Filled 2019-03-24: qty 1

## 2019-03-24 MED ORDER — SODIUM CHLORIDE 0.9% FLUSH: 3.0000 mL | Freq: Once | INTRAVENOUS | Status: DC

## 2019-03-24 NOTE — ED Provider Notes (Addendum)
[facility]    CSN: [id number] Arrival date & time: 03/24/19  3149      History   Chief Complaint Chief Complaint  Patient presents with  . Headache  . Chest Pain    HPI Bridget Maldonado is a 38 y.o. female with a history of asthma-controlled, chronic headaches comes to the urgent care with complaints of headaches over the past 3 days, chest pressure with recurrent sharp shooting pain over the same period of time.  Headaches were initially retro-orbital on the left side.  This started insidiously and is gotten progressively worse.  Is currently global and constant.  Aggravated somewhat by light.  Not relieved by ibuprofen.  She denies floaters in her visual fields.  Patient had some nausea and vomiting.  No numbness or tingling.  Patient describes chest pressure with intermittent sharp chest pain of 3 to 4 days duration.  Symptoms of gotten severe over the past 12 hours.  Sharp pain in the chest is currently constant and is currently 6 out of 10 in intensity.  Pain radiates to the back between her shoulder blades.  No known aggravating or relieving factors.  She has associated chest pressure and a sensation that her heart has been skipping beats.  She denies any dizziness, diaphoresis, near syncope or syncopal episodes.  No trauma to the chest.  Patient denies any calf pain or swelling.  She returned from [facility] over the weekend after staying there for 3 days.   HPI  Past Medical History:  Diagnosis Date  . Anxiety    Possible bipolar, Type II  . Arthritis   . Asthma   . Bipolar disorder ([place])   . BRCA negative 07/2017   vistaseq neg  . Chronic headaches   . Endometriosis 2018  . Family history of breast cancer   . Hemorrhoids 08/2007  . IBS (irritable bowel syndrome) 08/2007  . 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 Increased risk of breast cancer 07/2017   IBIS=40%  . Kidney infection   . LGSIL on Pap smear of cervix   . Migraines   . Multiple allergies   .  Obese   . Psoriasis   . Psoriatic arthritis [facility])     Patient Active Problem List   Diagnosis Date Noted  . Bipolar disease, chronic ([place]) 07/27/2018  . Abnormal MRI, breast 09/30/2017  . Family history of colon cancer 07/27/2017  . 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  . History of cervical dysplasia 07/16/2017  . Family history of breast cancer 07/16/2017  . Secondary oligomenorrhea 07/16/2017  . History of endometriosis 07/16/2017  . Psoriatic arthritis ([place]) 10/28/2016  . Chronic low back pain 10/28/2016  . Essential (primary) hypertension 10/18/2014  . Arthritis 11/24/2013  . Arthropathia 11/24/2013  . Obstipation 04/12/2013  . Migraine with status migrainosus 09/08/2012  . 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 Mild persistent asthma 05/26/2011  . Severe anxiety with panic 08/29/2006  . Panic disorder without agoraphobia 08/29/2006    Past Surgical History:  Procedure Laterality Date  . BREAST BIOPSY Left 8 years  . CESAREAN SECTION    . COLONOSCOPY    . COLONOSCOPY W/ BIOPSIES    . EAR TUBE REMOVAL    . ESOPHAGOGASTRODUODENOSCOPY    . LAPAROSCOPIC CHOLECYSTECTOMY  6/09   Dr. Hulen Skains  . TUBAL LIGATION  2017  . TYMPANOSTOMY TUBE PLACEMENT      OB History    Gravida  2   Para  1  Term  1   Preterm      AB  1   Living  1     SAB      TAB  1   Ectopic      Multiple      Live Births           Obstetric Comments  Menstrual age: 56  Age 1st Pregnancy: 80          Home Medications    Prior to Admission medications   Medication Sig Start Date End Date Taking? Authorizing Provider  albuterol (PROVENTIL HFA;VENTOLIN HFA) 108 (90 Base) MCG/ACT inhaler Inhale 2 puffs into the lungs every 6 (six) hours as needed for wheezing or shortness of breath. 04/16/16  Yes Venia Carbon, MD  ALPRAZolam Duanne Moron) 0.25 MG tablet Take 1 tablet (0.25 mg total) by mouth 2 (two) times daily as needed for anxiety. 03/10/19  Yes  Venia Carbon, MD  ARIPiprazole (ABILIFY) 5 MG tablet Take 1 tablet (5 mg total) by mouth at bedtime. 10/15/18  Yes Venia Carbon, MD  cetirizine (ZYRTEC) 10 MG tablet Take 10 mg by mouth daily.   Yes [provider]  DULoxetine (CYMBALTA) 60 MG capsule Take 1 capsule (60 mg total) by mouth daily. 01/22/18  Yes Venia Carbon, MD  Fluticasone-Salmeterol (ADVAIR) 250-50 MCG/DOSE AEPB Inhale 1 puff into the lungs 2 (two) times daily. 01/04/19  Yes Venia Carbon, MD  meloxicam (MOBIC) 15 MG tablet Take 1 tablet (15 mg total) by mouth daily. 01/31/18  Yes Cuthriell, Charline Bills, PA-C  mometasone (ELOCON) 0.1 % ointment APPLY TWICE DAILY AS NEEDED FOR PSORIASIS 10/15/18  Yes Venia Carbon, MD  traMADol (ULTRAM) 50 MG tablet Take 1 tablet (50 mg total) by mouth every 6 (six) hours as needed for moderate pain or severe pain. To be filled on or after 11/14/2018 02/24/19 02/24/20 Yes Venia Carbon, MD  [facility] PREFILLED 2 X 200 MG/ML KIT inject 2 pens/syringes (one dose) subcutaneously every 4 weeks 03/07/19   [provider]    Family History Family History  Problem Relation Age of Onset  . Colon polyps Maternal Grandfather   . Parkinson's disease Maternal Grandfather   . Breast cancer Mother 66  . Rheum arthritis Mother   . Irritable bowel syndrome Mother   . Asthma Father   . Anxiety disorder Father        Severe, possible bipolar  . Irritable bowel syndrome Maternal Grandmother   . Diabetes Other        mat. great uncles  . Alzheimer's disease Other        Dad's side  . Colon cancer Other        mat GGF    Social History Social History   Tobacco Use  . Smoking status: Never Smoker  . Smokeless tobacco: Never Used  Substance Use Topics  . Alcohol use: No    Alcohol/week: 0.0 standard drinks  . Drug use: No     Allergies   Sulfa antibiotics, Ciprofloxacin, and Effexor [venlafaxine hcl]   Review of Systems Review of Systems  Constitutional:  Positive for activity change. Negative for chills, fatigue and fever.  HENT: Negative.   Eyes: Negative.   Respiratory: Positive for chest tightness. Negative for cough, shortness of breath and wheezing.   Cardiovascular: Positive for chest pain.  Gastrointestinal: Negative for abdominal pain, diarrhea, nausea and vomiting.  Endocrine: Negative.   Genitourinary: Negative.   Musculoskeletal: Negative.  Skin: Negative.   Neurological: Positive for headaches. Negative for dizziness, syncope, facial asymmetry, weakness, light-headedness and numbness.  Psychiatric/Behavioral: Negative.   All other systems reviewed and are negative.    Physical Exam Triage Vital Signs ED Triage Vitals  Enc Vitals Group     BP 03/24/19 0833 (!) 166/108     Pulse Rate 03/24/19 0833 (!) 106     Resp 03/24/19 0833 18     Temp 03/24/19 0833 98.4 F (36.9 C)     Temp Source 03/24/19 0833 Oral     SpO2 03/24/19 0833 97 %     Weight 03/24/19 0830 200 lb (90.7 kg)     Height 03/24/19 0830 5' 4" (1.626 m)     Head Circumference --      Peak Flow --      Pain Score 03/24/19 0830 6     Pain Loc --      Pain Edu? --      Excl. in GC? --    No data found.  Updated Vital Signs BP (!) 166/108 (BP Location: Right Arm)   Pulse (!) 106   Temp 98.4 F (36.9 C) (Oral)   Resp 18   Ht 5' 4" (1.626 m)   Wt 90.7 kg   SpO2 97%   BMI 34.33 kg/m   Visual Acuity Right Eye Distance:   Left Eye Distance:   Bilateral Distance:    Right Eye Near:   Left Eye Near:    Bilateral Near:     Physical Exam Constitutional:      General: She is in acute distress.     Appearance: She is not ill-appearing or toxic-appearing.  Eyes:     General: No visual field deficit.    Extraocular Movements: Extraocular movements intact.     Pupils: Pupils are equal, round, and reactive to light.  Neck:     Musculoskeletal: Normal range of motion.  Cardiovascular:     Rate and Rhythm: Normal rate and regular rhythm.      Heart sounds: Normal heart sounds. No murmur. No friction rub. No gallop.   Pulmonary:     Effort: Pulmonary effort is normal.     Breath sounds: No wheezing or rhonchi.  Chest:     Chest wall: No tenderness.  Abdominal:     General: Bowel sounds are normal. There is no distension.     Palpations: Abdomen is soft. There is no mass.     Tenderness: There is no abdominal tenderness.  Musculoskeletal: Normal range of motion.        General: No swelling or tenderness.  Skin:    General: Skin is warm.     Capillary Refill: Capillary refill takes less than 2 seconds.     Coloration: Skin is not pale.     Findings: No erythema.  Neurological:     Mental Status: She is alert.     GCS: GCS eye subscore is 4. GCS verbal subscore is 5. GCS motor subscore is 6.     Cranial Nerves: Cranial nerve deficit present. No dysarthria or facial asymmetry.     Sensory: No sensory deficit.     Coordination: Coordination normal.     Deep Tendon Reflexes: Reflexes normal.  Psychiatric:        Mood and Affect: Mood normal.        Behavior: Behavior normal.      UC Treatments / Results  Labs (all labs ordered are listed, but only abnormal   results are displayed) Labs Reviewed  CBC WITH DIFFERENTIAL/PLATELET  BASIC METABOLIC PANEL  TROPONIN I (HIGH SENSITIVITY)    EKG   Radiology No results found.  Procedures Procedures (including critical care time)  Medications Ordered in UC Medications  SUMAtriptan (IMITREX) injection 6 mg (6 mg Subcutaneous Given 03/24/19 0921)  dexamethasone (DECADRON) injection 10 mg (10 mg Intravenous Given 03/24/19 0922)  metoCLOPramide (REGLAN) injection 10 mg (10 mg Intramuscular Given 03/24/19 0921)    Initial Impression / Assessment and Plan / UC Course  I have reviewed the triage vital signs and the nursing notes.  Pertinent labs & imaging results that were available during my care of the patient were reviewed by me and considered in my medical decision making  (see chart for details).     1.  Sharp chest pain radiating to the back: CBC, BMP and troponins have been drawn.  Given that the patient has sharp chest pain radiating to the back between her shoulder blades and presenting with hypotension and tachycardia, I believe the patient will benefit from further evaluation in the ED.  My main concern is that she may have an aortic process requiring further management.  She drove herself to the urgent care and she plans on driving herself to the emergency department to be evaluated.She was offered transport to ED via EMS.  2.  Acute migraines: Dexamethasone 10 mg IM Reglan 10 mg IM Imitrex 6 mg subcu Patient has no neurologic deficits Final Clinical Impressions(s) / UC Diagnoses   Final diagnoses:  Chest pain, unspecified type  Migraine without aura and without status migrainosus, not intractable   Discharge Instructions   None    ED Prescriptions    None     PDMP not reviewed this encounter.   Chase Picket, MD 03/24/19 [id number]    Chase Picket, MD 03/24/19 [phone]

## 2019-03-24 NOTE — ED Triage Notes (Signed)
Here with c/o chest pressure and pain in her left chest and left shoulder blade for the past few days. states it comes on with activity and at rest. Feels it speeding up then slowing down, "beating funny." NSR in triage. NAD.

## 2019-03-24 NOTE — ED Notes (Signed)
Pt had blood drawn at UC prior to arrival, see results. MD aware

## 2019-03-24 NOTE — ED Provider Notes (Signed)
[facility] Emergency Department Provider Note       Time seen: ----------------------------------------- 10:42 AM on 03/24/2019 -----------------------------------------   I have reviewed the triage vital signs and the nursing notes.  HISTORY   Chief Complaint Chest Pain    HPI Bridget Maldonado is a 38 y.o. female with a history of anxiety, asthma, bipolar disorder, IBS, migraines who presents to the ED for chest pressure and pain in her left chest and left shoulder blade for the past few days.  Patient states it comes on with activity and at rest.  She feels like her heart is beating funny as well.  Discomfort is 7 out of 10.  Past Medical History:  Diagnosis Date  . Anxiety    Possible bipolar, Type II  . Arthritis   . Asthma   . Bipolar disorder ([place])   . BRCA negative 07/2017   vistaseq neg  . Chronic headaches   . Endometriosis 2018  . Family history of breast cancer   . Hemorrhoids 08/2007  . IBS (irritable bowel syndrome) 08/2007  . 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 Increased risk of breast cancer 07/2017   IBIS=40%  . Kidney infection   . LGSIL on Pap smear of cervix   . Migraines   . Multiple allergies   . Obese   . Psoriasis   . Psoriatic arthritis [facility])     Patient Active Problem List   Diagnosis Date Noted  . Bipolar disease, chronic ([place]) 07/27/2018  . Abnormal MRI, breast 09/30/2017  . Family history of colon cancer 07/27/2017  . 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  . History of cervical dysplasia 07/16/2017  . Family history of breast cancer 07/16/2017  . Secondary oligomenorrhea 07/16/2017  . History of endometriosis 07/16/2017  . Psoriatic arthritis ([place]) 10/28/2016  . Chronic low back pain 10/28/2016  . Essential (primary) hypertension 10/18/2014  . Arthritis 11/24/2013  . Arthropathia 11/24/2013  . Obstipation 04/12/2013  . Migraine with status migrainosus 09/08/2012  . IBS (irritable bowel syndrome)-diarrhea  predominant 06/07/2012  . Mild persistent asthma 05/26/2011  . Severe anxiety with panic 08/29/2006  . Panic disorder without agoraphobia 08/29/2006    Past Surgical History:  Procedure Laterality Date  . BREAST BIOPSY Left 8 years  . CESAREAN SECTION    . COLONOSCOPY    . COLONOSCOPY W/ BIOPSIES    . EAR TUBE REMOVAL    . ESOPHAGOGASTRODUODENOSCOPY    . LAPAROSCOPIC CHOLECYSTECTOMY  6/09   Dr. Hulen Skains  . TUBAL LIGATION  2017  . TYMPANOSTOMY TUBE PLACEMENT      Allergies Sulfa antibiotics, Ciprofloxacin, and Effexor [venlafaxine hcl]  Social History Social History   Tobacco Use  . Smoking status: Never Smoker  . Smokeless tobacco: Never Used  Substance Use Topics  . Alcohol use: No    Alcohol/week: 0.0 standard drinks  . Drug use: No    Review of Systems Constitutional: Negative for fever. Cardiovascular: Positive for chest pain Respiratory: Negative for shortness of breath. Gastrointestinal: Negative for abdominal pain, vomiting and diarrhea. Musculoskeletal: Positive for back pain Skin: Negative for rash. Neurological: Negative for headaches, focal weakness or numbness. All systems negative/normal/unremarkable except as stated in the HPI  ____________________________________________   PHYSICAL EXAM:  VITAL SIGNS: ED Triage Vitals  Enc Vitals Group     BP 03/24/19 1005 (!) 149/100     Pulse Rate 03/24/19 1005 93     Resp 03/24/19 1005 16     Temp 03/24/19 1005 98.9 F (  37.2 C)     Temp Source 03/24/19 1005 Oral     SpO2 03/24/19 1005 97 %     Weight 03/24/19 1006 200 lb (90.7 kg)     Height 03/24/19 1006 '5\' 4"'  (1.626 m)     Head Circumference --      Peak Flow --      Pain Score 03/24/19 1006 7     Pain Loc --      Pain Edu? --      Excl. in [place]? --     Constitutional: Alert and oriented. Well appearing and in no distress. Eyes: Conjunctivae are normal. Normal extraocular movements. ENT      Head: Normocephalic and atraumatic.      Nose: No  congestion/rhinnorhea.      Mouth/Throat: Mucous membranes are moist.      Neck: No stridor. Cardiovascular: Normal rate, regular rhythm. No murmurs, rubs, or gallops. Respiratory: Normal respiratory effort without tachypnea nor retractions. Breath sounds are clear and equal bilaterally. No wheezes/rales/rhonchi. Gastrointestinal: Soft and nontender. Normal bowel sounds Musculoskeletal: Nontender with normal range of motion in extremities. No lower extremity tenderness nor edema. Neurologic:  Normal speech and language. No gross focal neurologic deficits are appreciated.  Skin:  Skin is warm, dry and intact. No rash noted. Psychiatric: Mood and affect are normal. Speech and behavior are normal.  ____________________________________________  EKG: Interpreted by me.  Sinus rhythm with rate of 95 bpm, possible septal infarct age-indeterminate, normal axis, normal QT  ____________________________________________  ED COURSE:  As part of my medical decision making, I reviewed the following data within the [place] History obtained from family if available, nursing notes, old chart and ekg, as well as notes from prior ED visits. Patient presented for chest pressure, we will assess with labs and imaging as indicated at this time.   Procedures  Bridget Maldonado was evaluated in Emergency Department on 03/24/2019 for the symptoms described in the history of present illness. She was evaluated in the context of the global COVID-19 pandemic, which necessitated consideration that the patient might be at risk for infection with the SARS-CoV-2 virus that causes COVID-19. Institutional protocols and algorithms that pertain to the evaluation of patients at risk for COVID-19 are in a state of rapid change based on information released by regulatory bodies including the CDC and federal and state organizations. These policies and algorithms were followed during the patient's care in the ED.   ____________________________________________   LABS (pertinent positives/negatives)  Labs Reviewed  TROPONIN I (HIGH SENSITIVITY)  TROPONIN I (HIGH SENSITIVITY)    RADIOLOGY Images were viewed by me  CT angiogram dissection protocol  1. No appreciable atherosclerotic plaque noted in the aorta, major  mesenteric, and major pelvic arterial vessels. No aneurysm or  dissection involving these vessels. No fibromuscular dysplasia.  2. No bowel obstruction. No abscess in the abdomen pelvis. No  periappendiceal inflammatory change.  3. No renal or ureteral calculi. No hydronephrosis. Urinary bladder  wall thickness normal.  4. Gallbladder absent. Status post tubal ligation clip placement. A  clip in the anterior right lower quadrant is stable and of  questionable origin.  5. Suspect granulation tissue or possible keloid in the anterior,  medial right pelvic wall. This finding is stable compared to  previous study.  ____________________________________________   DIFFERENTIAL DIAGNOSIS   Anxiety, GERD, musculoskeletal pain, MI, PE, dissection  FINAL ASSESSMENT AND PLAN  Chest pressure   Plan: The patient had presented for chest  pressure which is likely multifactorial with anxiety and GERD being near the top of the list. Patient's labs prior to arrival were normal, we repeated her troponin here which is reassuring. Patient's imaging did not reveal any acute process.  She will be referred to cardiology for close outpatient follow-up.   Laurence Aly, MD    Note: This note was generated in part or whole with voice recognition software. Voice recognition is usually quite accurate but there are transcription errors that can and very often do occur. I apologize for any typographical errors that were not detected and corrected.     Earleen Newport, MD 03/24/19 1230

## 2019-03-24 NOTE — ED Notes (Signed)
Patient transported to CT 

## 2019-03-24 NOTE — ED Triage Notes (Signed)
Patient c/o headache x 3 days. She states last night she started having chest pressure and trouble catching her breath. Denies fever, denies cough.

## 2019-04-04 ENCOUNTER — Encounter: Payer: Self-pay | Admitting: Cardiology

## 2019-04-04 ENCOUNTER — Ambulatory Visit (INDEPENDENT_AMBULATORY_CARE_PROVIDER_SITE_OTHER): Payer: Medicaid Other | Admitting: Cardiology

## 2019-04-04 ENCOUNTER — Other Ambulatory Visit: Payer: Self-pay

## 2019-04-04 VITALS — BP 148/102 | HR 90 | Ht 64.0 in | Wt 207.0 lb

## 2019-04-04 DIAGNOSIS — R079 Chest pain, unspecified: Secondary | ICD-10-CM | POA: Diagnosis not present

## 2019-04-04 DIAGNOSIS — I10 Essential (primary) hypertension: Secondary | ICD-10-CM

## 2019-04-04 DIAGNOSIS — G4489 Other headache syndrome: Secondary | ICD-10-CM | POA: Diagnosis not present

## 2019-04-04 MED ORDER — LOSARTAN POTASSIUM 50 MG PO TABS: 50.0000 mg | ORAL_TABLET | Freq: Every day | ORAL | 3 refills | Status: DC

## 2019-04-04 NOTE — Patient Instructions (Signed)
Medication Instructions:  Your physician has recommended you make the following change in your medication:   START Losartan 50mg  daily. An Rx has been sent to your pharmacy.  Take Nexium (esomeprazole) as directed daily.  If you need a refill on your cardiac medications before your next appointment, please call your pharmacy.   Lab work: None ordered If you have labs (blood work) drawn today and your tests are completely normal, you will receive your results only by: Marland Kitchen MyChart Message (if you have MyChart) OR . A paper copy in the mail If you have any lab test that is abnormal or we need to change your treatment, we will call you to review the results.  Testing/Procedures: None ordered  Follow-Up: At [facility], you and your health needs are our priority.  As part of our continuing mission to provide you with exceptional heart care, we have created designated Provider Care Teams.  These Care Teams include your primary Cardiologist (physician) and Advanced Practice Providers (APPs -  Physician Assistants and Nurse Practitioners) who all work together to provide you with the care you need, when you need it. You will need a follow up appointment in 4-6 weeks.   You may see Kate Sable, MD or one of the following Advanced Practice Providers on your designated Care Team:   Murray Hodgkins, NP Christell Faith, PA-C . Marrianne Mood, PA-C  Any Other Special Instructions Will Be Listed Below (If Applicable). Your physician has requested that you regularly monitor and record your blood pressure readings at home. Please use the same machine at the same time of day to check your readings and record them to bring to your follow-up visit.

## 2019-04-04 NOTE — Progress Notes (Signed)
Cardiology Office Note:    Date:  04/04/2019   ID:  Bridget Maldonado, DOB Jan 05, 1981, MRN [id number]  PCP:  Venia Carbon, MD  Cardiologist:  Kate Sable, MD  Electrophysiologist:  None   Referring MD: Venia Carbon, MD   Chief Complaint  Patient presents with  . New Patient (Initial Visit)    per Lenise Herald for chest pressure - ED f/u. patient c/o SOB and chest pain. Meds reviewed verbally with patient.     History of Present Illness:    Bridget Maldonado is a 38 y.o. female with a hx of asthma, anxiety, bipolar disorder, IBS who presents due to 2 weeks symptoms of chest pain.  Patient describes pain as sharp and radiating to her left shoulder sometimes.  It typically lasts a few seconds and then goes away.  She rates it as a 10 out of 10 when it occurs.  The pain is not related with exertion.  She went to the emergency room about 10 days ago where work-up with EKG, troponin and chest CT angiogram were all within normal limits, no evidence of dissection or elevated troponins.  She also notes having a history of reflux disease, sparingly takes her Nexium.  Endorses a history of migraines which she is experiencing right now.  She denies any history of heart disease, or smoking history.  Denies any family history of heart disease.  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  Past Medical History:  Diagnosis Date  . Anxiety    Possible bipolar, Type II  . Arthritis   . Asthma   . Bipolar disorder ([place])   . BRCA negative 07/2017   vistaseq neg  . Chronic headaches   . Endometriosis 2018  . Family history of breast cancer   . Hemorrhoids 08/2007  . IBS (irritable bowel syndrome) 08/2007  . 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 Increased risk of breast cancer 07/2017   IBIS=40%  . Kidney infection   . LGSIL on Pap smear of cervix   . Migraines   . Multiple allergies   . Obese   . Psoriasis   . Psoriatic arthritis [facility])     Past  Surgical History:  Procedure Laterality Date  . BREAST BIOPSY Left 8 years  . CESAREAN SECTION    . COLONOSCOPY    . COLONOSCOPY W/ BIOPSIES    . EAR TUBE REMOVAL    . ESOPHAGOGASTRODUODENOSCOPY    . LAPAROSCOPIC CHOLECYSTECTOMY  6/09   Dr. Hulen Skains  . TUBAL LIGATION  2017  . TYMPANOSTOMY TUBE PLACEMENT      Current Medications: Current Meds  Medication Sig  . albuterol (PROVENTIL HFA;VENTOLIN HFA) 108 (90 Base) MCG/ACT inhaler Inhale 2 puffs into the lungs every 6 (six) hours as needed for wheezing or shortness of breath.  . ALPRAZolam (XANAX) 0.25 MG tablet Take 1 tablet (0.25 mg total) by mouth 2 (two) times daily as needed for anxiety.  . ARIPiprazole (ABILIFY) 5 MG tablet Take 1 tablet (5 mg total) by mouth at bedtime.  . cetirizine (ZYRTEC) 10 MG tablet Take 10 mg by mouth daily.  Marland Kitchen CIMZIA PREFILLED 2 X 200 MG/ML KIT inject 2 pens/syringes (one dose) subcutaneously every 4 weeks  . DULoxetine (CYMBALTA) 60 MG capsule Take 1 capsule (60 mg total) by mouth daily.  Marland Kitchen esomeprazole (NEXIUM) 40 MG capsule Take 1 capsule (40 mg total) by mouth daily.  . Fluticasone-Salmeterol (ADVAIR) 250-50 MCG/DOSE AEPB Inhale  1 puff into the lungs 2 (two) times daily.  . meloxicam (MOBIC) 15 MG tablet Take 1 tablet (15 mg total) by mouth daily.  . mometasone (ELOCON) 0.1 % ointment APPLY TWICE DAILY AS NEEDED FOR PSORIASIS  . traMADol (ULTRAM) 50 MG tablet Take 1 tablet (50 mg total) by mouth every 6 (six) hours as needed for moderate pain or severe pain. To be filled on or after 11/14/2018  . zolpidem (AMBIEN) 10 MG tablet Take 1 tablet (10 mg total) by mouth at bedtime as needed for sleep.     Allergies:   Sulfa antibiotics, Ciprofloxacin, and Effexor [venlafaxine hcl]   Social History   Socioeconomic History  . Marital status: Single    Spouse name: Not on file  . Number of children: 1  . Years of education: Not on file  . Highest education level: Not on file  Occupational History  .  Occupation: Herbalist    Comment: [employer]  . Financial resource strain: Not on file  . Food insecurity    Worry: Not on file    Inability: Not on file  . Transportation needs    Medical: Not on file    Non-medical: Not on file  Tobacco Use  . Smoking status: Never Smoker  . Smokeless tobacco: Never Used  Substance and Sexual Activity  . Alcohol use: No    Alcohol/week: 0.0 standard drinks  . Drug use: No  . Sexual activity: Yes    Birth control/protection: Surgical  Lifestyle  . Physical activity    Days per week: Not on file    Minutes per session: Not on file  . Stress: Not on file  Relationships  . Social Herbalist on phone: Not on file    Gets together: Not on file    Attends religious service: Not on file    Active member of club or organization: Not on file    Attends meetings of clubs or organizations: Not on file    Relationship status: Not on file  Other Topics Concern  . Not on file  Social History Narrative  . Not on file     Family History: The patient's family history includes Alzheimer's disease in an other family member; Anxiety disorder in her father; Asthma in her father; Breast cancer (age of onset: 35) in her mother; Colon cancer in an other family member; Colon polyps in her maternal grandfather; Diabetes in an other family member; Irritable bowel syndrome in her maternal grandmother and mother; Parkinson's disease in her maternal grandfather; Rheum arthritis in her mother.  ROS:   Please see the history of present illness.     All other systems reviewed and are negative.  EKGs/Labs/Other Studies Reviewed:    The following studies were reviewed today:  CTA chest IMPRESSION: CT angiogram chest:  1.  No thoracic aortic aneurysm or dissection.  2.  No evident pulmonary embolus.  3. Stable mosaic attenuation in the left lower lobe. Suspect small airways obstructive disease. No consolidation  or pleural effusion. No pneumothorax.  4.  No thoracic adenopathy.  EKG:  EKG is  ordered today.  The ekg ordered today demonstrates normal sinus rhythm, possible old septal infarct..  Recent Labs: 03/24/2019: BUN 20; Creatinine, Ser 0.72; Hemoglobin 14.3; Platelets 253; Potassium 3.8; Sodium 137  Recent Lipid Panel No results found for: CHOL, TRIG, HDL, CHOLHDL, VLDL, LDLCALC, LDLDIRECT  Physical Exam:    VS:  BP (!) 148/102 (BP  Location: Right Arm, Patient Position: Sitting, Cuff Size: Normal)   Pulse 90   Ht '5\' 4"'  (1.626 m)   Wt 207 lb (93.9 kg)   BMI 35.53 kg/m     Wt Readings from Last 3 Encounters:  04/04/19 207 lb (93.9 kg)  03/24/19 200 lb (90.7 kg)  03/24/19 200 lb (90.7 kg)     GEN:  Well nourished, well developed in mild acute distress, HEENT: Normal NECK: No JVD; No carotid bruits LYMPHATICS: No lymphadenopathy CARDIAC: RRR, no murmurs, rubs, gallops RESPIRATORY:  Clear to auscultation without rales, wheezing or rhonchi  ABDOMEN: Soft, non-tender, non-distended MUSCULOSKELETAL:  No edema; No deformity  SKIN: Warm and dry NEUROLOGIC:  Alert and oriented x 3 PSYCHIATRIC:  Normal affect   ASSESSMENT:   Her symptoms of chest pain are very atypical.  Her main risk factor is elevated blood pressures.  She has no family history of cardiac disease.  I will rate her as a low risk for cardiac disease.  Reflux in addition to anxiety could also be contributing to her symptoms.  We will do a trial of Protonix every day, treat her blood pressures and see if symptoms improve.  If she has persistent pain then we may consider further testing such as stress testing. 1. Essential hypertension   2. Chest pain, unspecified type   3. Other headache syndrome    PLAN:    In order of problems listed above:  1. Start losartan 50 mg daily.  Patient counseled to check blood pressure every day. 2. Patient advised to take Nexium 40 mg every day as currently prescribed.  Monitor  symptoms closely. 3. Advised patient to meet with primary care provider or specialist to manage her migraine as she is clearly symptomatic while in the office today.  Total encounter time more than 45 minutes  Greater than 50% was spent in counseling and coordination of care with the patient  F/u in 4-6 weeks  Medication Adjustments/Labs and Tests Ordered: Current medicines are reviewed at length with the patient today.  Concerns regarding medicines are outlined above.  Orders Placed This Encounter  Procedures  . EKG 12-Lead   Meds ordered this encounter  Medications  . losartan (COZAAR) 50 MG tablet    Sig: Take 1 tablet (50 mg total) by mouth daily.    Dispense:  90 tablet    Refill:  3    Patient Instructions  Medication Instructions:  Your physician has recommended you make the following change in your medication:   START Losartan 53m daily. An Rx has been sent to your pharmacy.  Take Nexium (esomeprazole) as directed daily.  If you need a refill on your cardiac medications before your next appointment, please call your pharmacy.   Lab work: None ordered If you have labs (blood work) drawn today and your tests are completely normal, you will receive your results only by: .Marland KitchenMyChart Message (if you have MyChart) OR . A paper copy in the mail If you have any lab test that is abnormal or we need to change your treatment, we will call you to review the results.  Testing/Procedures: None ordered  Follow-Up: [facility] and your [facility] are our [facility].  As part of our continuing mission to provide you with exceptional heart care, we have created designated Provider Care Teams.  These Care Teams include your primary Cardiologist (physician) and Advanced Practice Providers (APPs -  Physician Assistants and Nurse Practitioners) who all work together to  provide you with the care you need, when you need it. You will need a follow up appointment in 4-6 weeks.    You may see Kate Sable, MD or one of the following Advanced Practice Providers on your designated Care Team:   Murray Hodgkins, NP Christell Faith, PA-C . Marrianne Mood, PA-C  Any Other Special Instructions Will Be Listed Below (If Applicable). Your physician has requested that you regularly monitor and record your blood pressure readings at home. Please use the same machine at the same time of day to check your readings and record them to bring to your follow-up visit.        Signed, Kate Sable, MD  04/04/2019 9:00 AM    [place]

## 2019-04-08 ENCOUNTER — Other Ambulatory Visit: Payer: Self-pay

## 2019-04-08 MED ORDER — TRAMADOL HCL 50 MG PO TABS: 50.0000 mg | ORAL_TABLET | Freq: Four times a day (QID) | ORAL | 0 refills | Status: DC | PRN

## 2019-04-08 NOTE — Telephone Encounter (Signed)
Last filled 02-24-19 #30 Last OV 10-15-18 Next OV 04-15-19 [place] to [facility] in Dr Alla German absence

## 2019-04-15 ENCOUNTER — Ambulatory Visit: Payer: Medicaid Other | Admitting: Internal Medicine

## 2019-05-05 ENCOUNTER — Telehealth: Payer: Self-pay

## 2019-05-05 NOTE — Telephone Encounter (Signed)
-----   Message from Clarisse Gouge sent at 04/05/2019  1:26 PM EDT ----- Regarding: 4-6 wk  fu per checkout 04/05/19 [facility] Lmov to schedule fu

## 2019-05-05 NOTE — Telephone Encounter (Signed)
l mom to scheudule f/u

## 2019-05-06 ENCOUNTER — Other Ambulatory Visit: Payer: Self-pay | Admitting: Internal Medicine

## 2019-05-06 MED ORDER — TRAMADOL HCL 50 MG PO TABS: 50.0000 mg | ORAL_TABLET | Freq: Four times a day (QID) | ORAL | 0 refills | Status: DC | PRN

## 2019-05-06 NOTE — Telephone Encounter (Signed)
Last filled 04-08-19 #30 Last OV 10-15-18 No Future [place]

## 2019-05-26 ENCOUNTER — Other Ambulatory Visit: Payer: Self-pay

## 2019-05-26 DIAGNOSIS — Z20822 Contact with and (suspected) exposure to covid-19: Secondary | ICD-10-CM

## 2019-05-26 NOTE — Addendum Note (Signed)
Addended by: Alan Mulder on: 05/26/2019 12:52 PM   Modules accepted: Orders

## 2019-05-29 ENCOUNTER — Telehealth: Payer: Self-pay

## 2019-05-29 LAB — NOVEL CORONAVIRUS, NAA

## 2019-05-29 NOTE — Telephone Encounter (Signed)
Received call from patient checking Covid results.  Advised no results at this time.   

## 2019-05-30 ENCOUNTER — Telehealth: Payer: Self-pay | Admitting: Internal Medicine

## 2019-05-30 ENCOUNTER — Other Ambulatory Visit: Payer: Self-pay

## 2019-05-30 DIAGNOSIS — Z20822 Contact with and (suspected) exposure to covid-19: Secondary | ICD-10-CM

## 2019-05-30 NOTE — Telephone Encounter (Signed)
Patient called todeay stating she was tested on Friday but they advised her that she tested to soon. She is wanting to see if she can get a rapid test done so she can let her work know if she is positive or negative. Patient does not want to wait the 3-5 days for results.,    Patient is requesting a referral for rapid test???   Do we know anywhere who does rapid.

## 2019-05-30 NOTE — Telephone Encounter (Signed)
I do not know anywhere that I can send her for a rapid test. It is also not quite as accurate as the throat swabs that use the molecular technology

## 2019-05-30 NOTE — Telephone Encounter (Signed)
Called patient back and advised of message below. Patient stated she went back to [facility] to retest and will wait for results

## 2019-05-31 LAB — NOVEL CORONAVIRUS, NAA: SARS-CoV-2, NAA: NOT DETECTED

## 2019-06-01 ENCOUNTER — Telehealth: Payer: Self-pay | Admitting: Internal Medicine

## 2019-06-01 NOTE — Telephone Encounter (Signed)
Patient called.  Patient was tested for covid on 05/30/19 and it came back not detected.  Patient's family has covid.  Patient need a note for work stating that patient's cleared to go back to work. Patient said note can be sent through my chart.

## 2019-06-02 NOTE — Telephone Encounter (Signed)
Find out if she has any symptoms. If not, can send MyChart letter stating okay to go to work (need to see the negative result in the chart)

## 2019-06-02 NOTE — Telephone Encounter (Signed)
Spoke to pt

## 2019-06-02 NOTE — Telephone Encounter (Signed)
Okay to send the letter--but she should have minimal or resolved symptoms since the risk of false negative results still exists

## 2019-06-02 NOTE — Telephone Encounter (Signed)
Spoke to pt. Still has a cough and stuffy nose. No fever over 100 since the evening of 03-31-19. Results are in her Lab Tab.

## 2019-06-07 ENCOUNTER — Telehealth: Payer: Self-pay

## 2019-06-07 MED ORDER — TRAMADOL HCL 50 MG PO TABS: 50.0000 mg | ORAL_TABLET | Freq: Four times a day (QID) | ORAL | 0 refills | Status: DC | PRN

## 2019-06-07 NOTE — Telephone Encounter (Signed)
Please set her up for physical (or at least a follow up) in about 6 months

## 2019-06-07 NOTE — Telephone Encounter (Signed)
I left a detailed message on patient's voice mail to call back and schedule cpx or f/u in 6 months.

## 2019-06-07 NOTE — Telephone Encounter (Signed)
Last filled 05-06-19 #30 Last OV 10-15-18 No Future [place]

## 2019-06-14 NOTE — Telephone Encounter (Signed)
Attempted to schedule.  

## 2019-07-05 ENCOUNTER — Other Ambulatory Visit: Payer: Self-pay

## 2019-07-05 NOTE — Telephone Encounter (Signed)
Refills have been requested for the following medications:    traMADol (ULTRAM) 50 MG tablet Viviana Simpler, MD]   Preferred pharmacy: [facility], [place]

## 2019-07-06 MED ORDER — TRAMADOL HCL 50 MG PO TABS: 50.0000 mg | ORAL_TABLET | Freq: Four times a day (QID) | ORAL | 0 refills | Status: DC | PRN

## 2019-07-06 NOTE — Telephone Encounter (Signed)
Routed to PCP for input.

## 2019-07-25 NOTE — Telephone Encounter (Signed)
l mom to schedule follow up

## 2019-07-27 NOTE — Telephone Encounter (Signed)
Attempted to schedule no ans no vm  

## 2019-08-08 ENCOUNTER — Other Ambulatory Visit: Payer: Self-pay

## 2019-08-08 MED ORDER — TRAMADOL HCL 50 MG PO TABS: 50.0000 mg | ORAL_TABLET | Freq: Four times a day (QID) | ORAL | 0 refills | Status: DC | PRN

## 2019-08-08 NOTE — Telephone Encounter (Signed)
He electronic refill failed so I called it in and left it on Voicemail.

## 2019-08-08 NOTE — Telephone Encounter (Signed)
Last filled 07-06-19 #30 Last OV 10-15-18 Next OV 12-22-19 [place]

## 2019-08-12 ENCOUNTER — Other Ambulatory Visit: Payer: Self-pay

## 2019-08-12 MED ORDER — ALPRAZOLAM 0.25 MG PO TABS: 0.2500 mg | ORAL_TABLET | Freq: Two times a day (BID) | ORAL | 0 refills | Status: DC | PRN

## 2019-08-12 NOTE — Telephone Encounter (Signed)
Last written 03-10-19 #60 Last OV 10-15-18 Next OV 12-22-19 [place]

## 2019-08-15 NOTE — Telephone Encounter (Signed)
Attempted to schedule. Lmov  

## 2019-08-19 ENCOUNTER — Telehealth: Payer: Self-pay | Admitting: Cardiology

## 2019-08-19 NOTE — Telephone Encounter (Signed)
Unable to reach .  Mailed letter. Deleting recall

## 2019-09-02 ENCOUNTER — Other Ambulatory Visit: Payer: Self-pay

## 2019-09-02 NOTE — Telephone Encounter (Signed)
Last filled 08-08-19 #30 Last OV 10-15-18 Next OV 12-22-19 [place]

## 2019-09-03 MED ORDER — TRAMADOL HCL 50 MG PO TABS: 50.0000 mg | ORAL_TABLET | Freq: Four times a day (QID) | ORAL | 0 refills | Status: DC | PRN

## 2019-09-11 IMAGING — MG DIGITAL SCREENING BILATERAL MAMMOGRAM WITH TOMO AND CAD
6 of 12 series · 6 of 36 positions shown · non-contrast
Comparison: Previous exam(s).

CLINICAL DATA: Screening. Strong family history of breast cancer
with mother diagnosed with breast cancer at age 29.

EXAM:
DIGITAL SCREENING BILATERAL MAMMOGRAM WITH TOMO AND CAD

[L MLO synth-2D (1 of 2)]
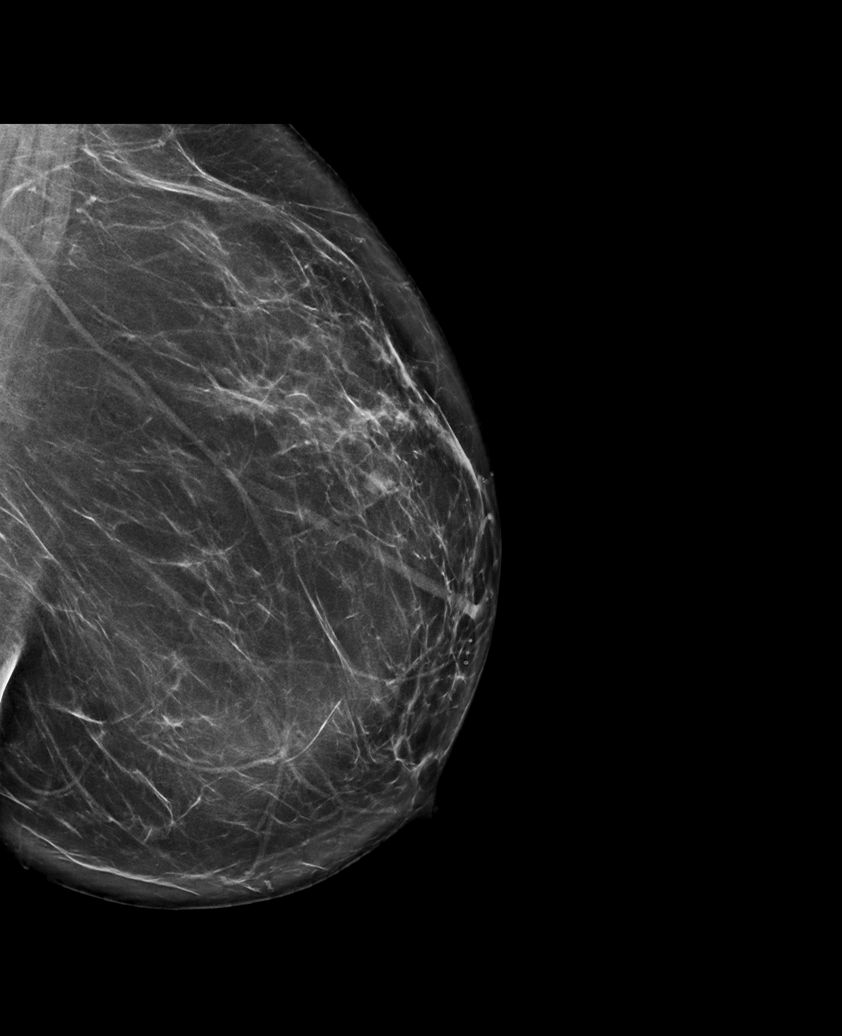

[R CC synth-2D]
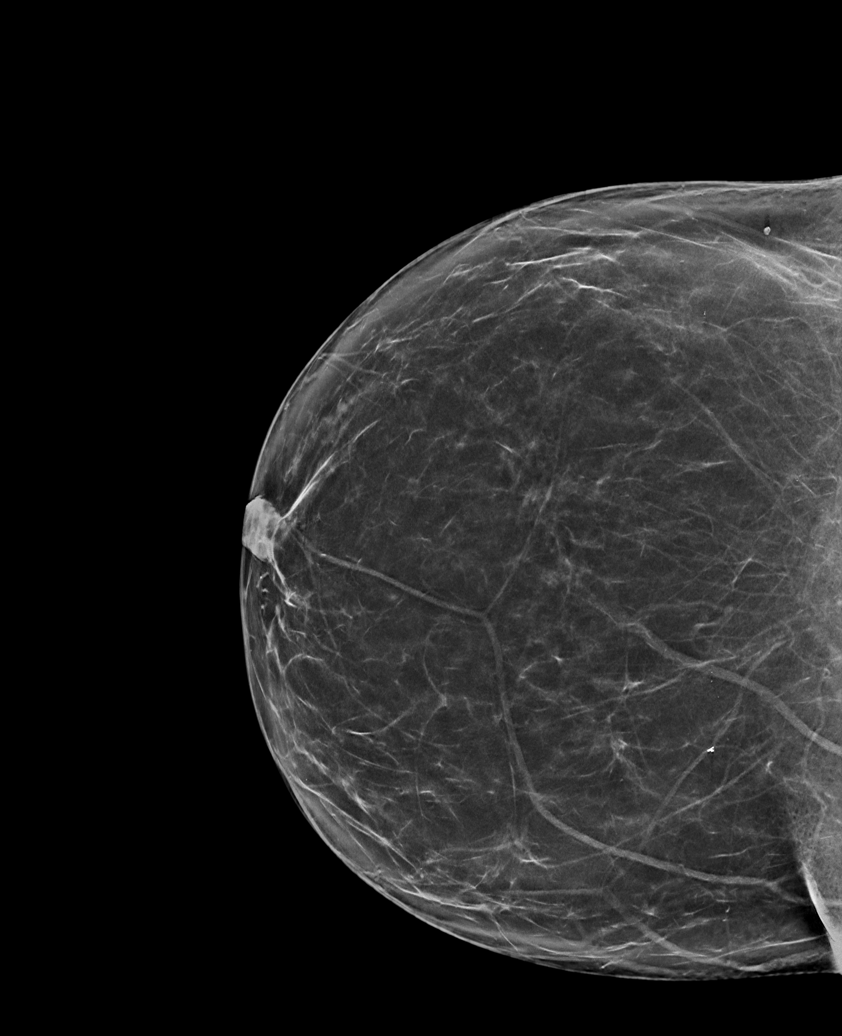

[L CC synth-2D]
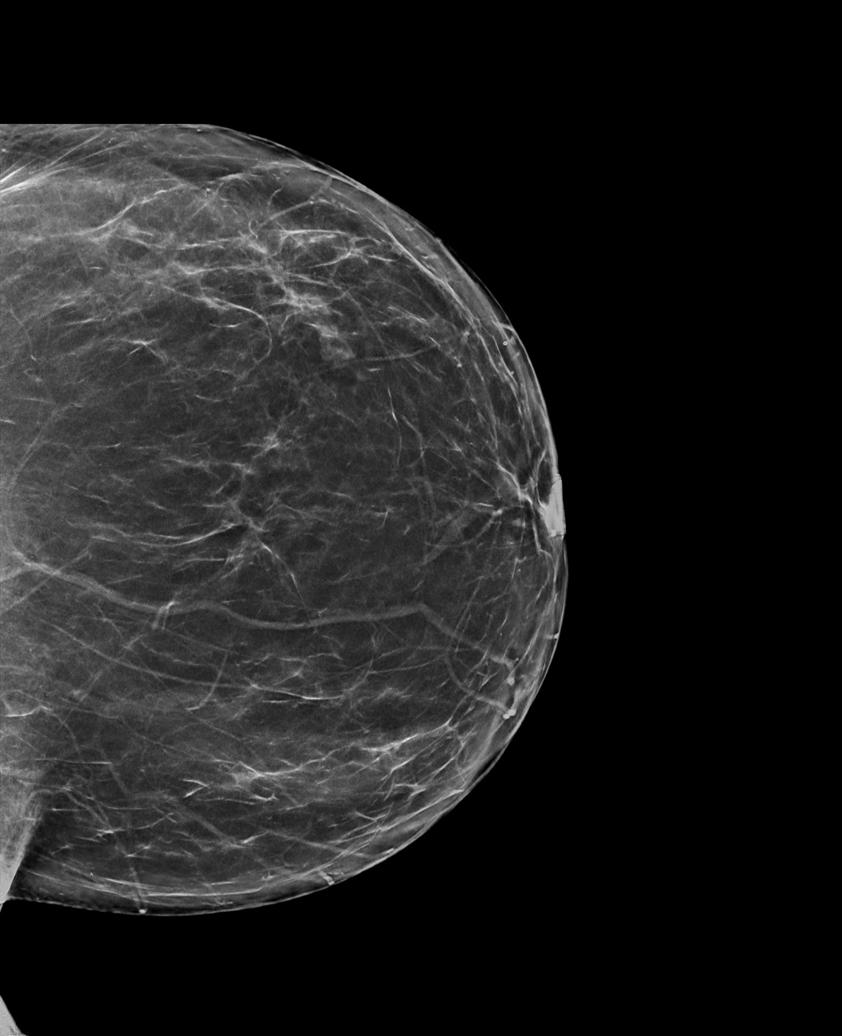

[R MLO synth-2D (1 of 2)]
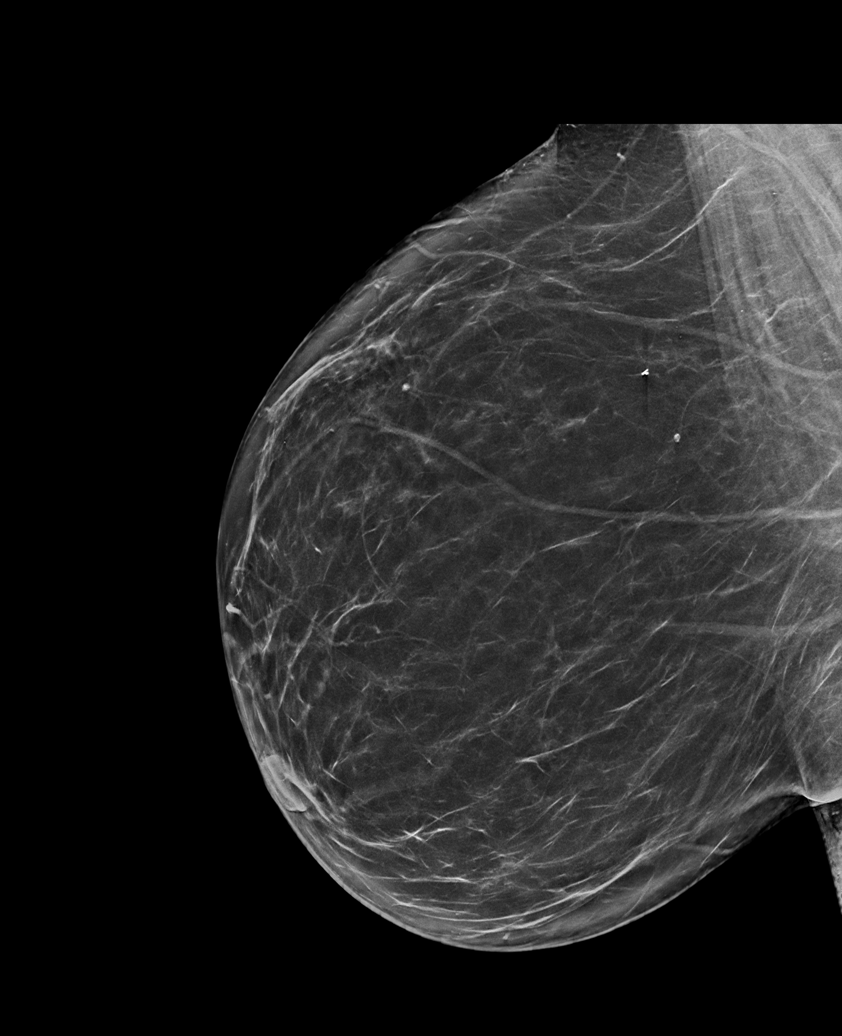

[R MLO synth-2D (2 of 2)]
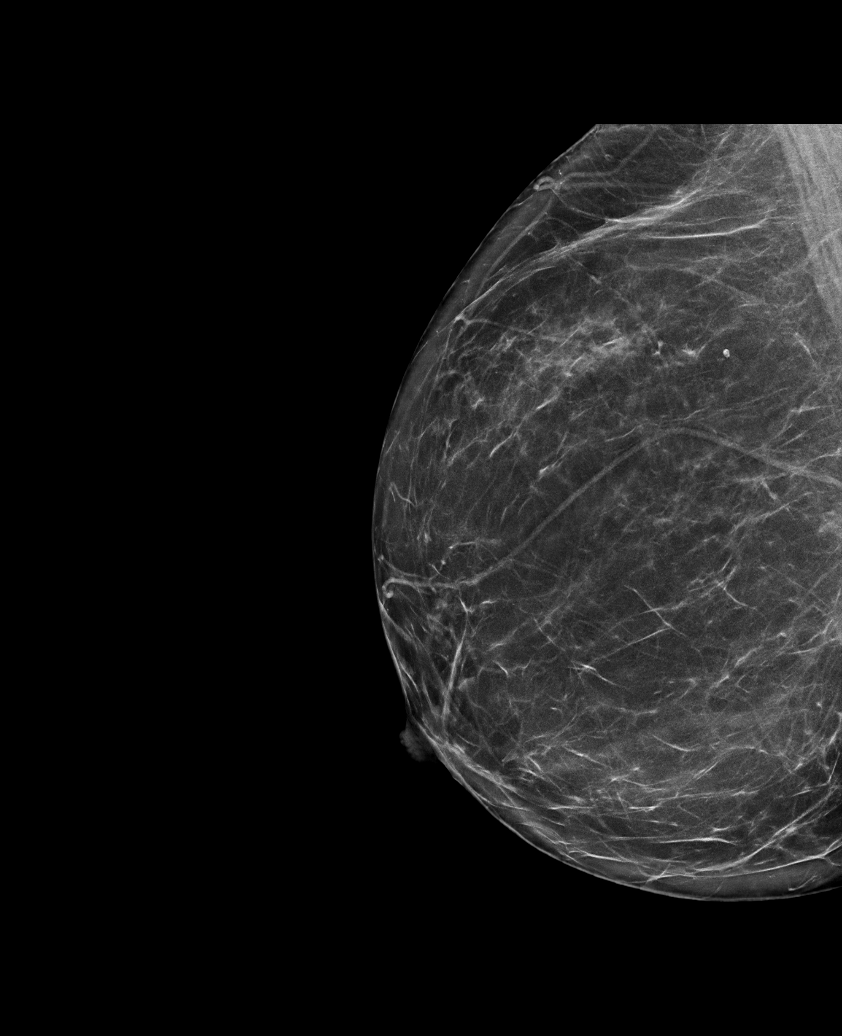

[L MLO synth-2D (2 of 2)]
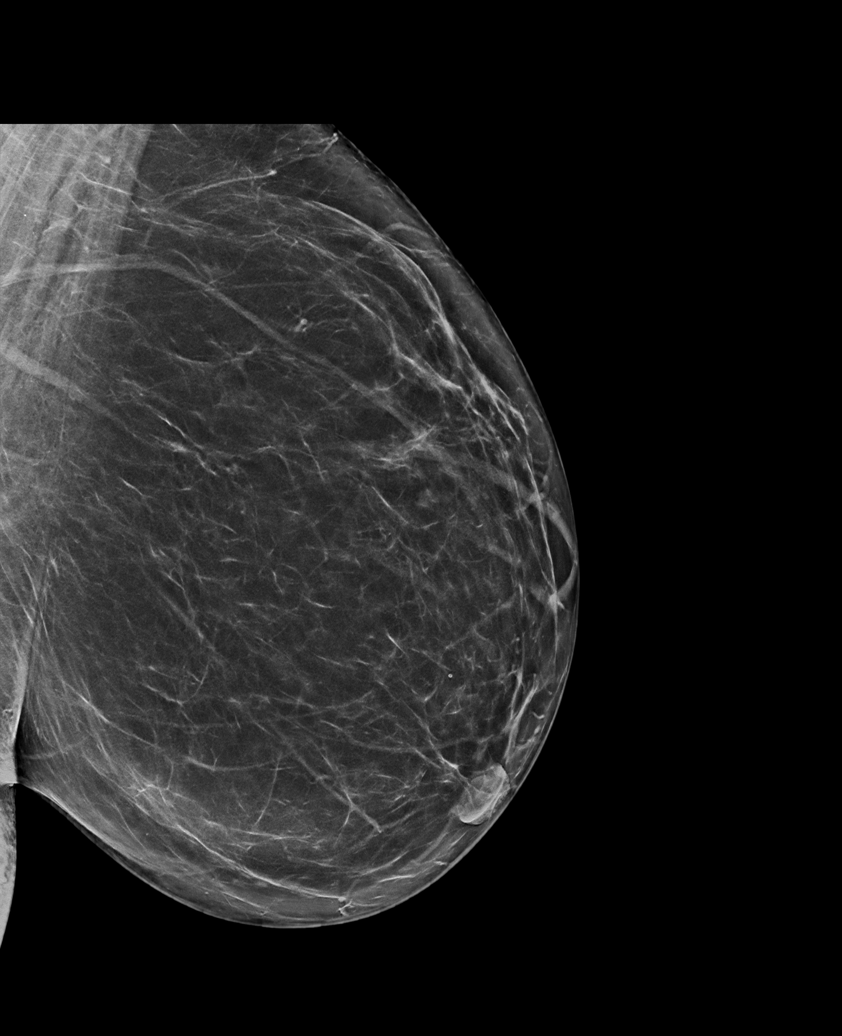

[6 of 36 positions shown; findings below may reference images not displayed]

ACR Breast Density Category b: There are scattered areas of
fibroglandular density.
FINDINGS: There are no findings suspicious for malignancy. Images were
processed with CAD.
IMPRESSION: No mammographic evidence of malignancy. A result letter of this
screening mammogram will be mailed directly to the patient.

RECOMMENDATION:
1.  Screening mammogram in one year.(Code:[id number]).

2. Strong family history of breast cancer with patient's mother
diagnosed with breast cancer at age 29. Based on the recommendations
of the [employer], annual screening MRI is suggested in
addition to annual mammography if the patient has an estimated
lifetime risk of developing breast cancer which is greater than 20%.

BI-RADS CATEGORY  1: Negative.

## 2019-09-12 ENCOUNTER — Other Ambulatory Visit: Payer: Self-pay

## 2019-09-12 ENCOUNTER — Ambulatory Visit
Admission: EM | Admit: 2019-09-12 | Discharge: 2019-09-12 | Disposition: A | Discharge status: Home / Self Care | Payer: Medicaid Other | Attending: Family Medicine | Admitting: Family Medicine

## 2019-09-12 DIAGNOSIS — R079 Chest pain, unspecified: Secondary | ICD-10-CM

## 2019-09-12 DIAGNOSIS — R0789 Other chest pain: Secondary | ICD-10-CM | POA: Diagnosis present

## 2019-09-12 MED ORDER — PREDNISONE 10 MG PO TABS: ORAL_TABLET | ORAL | 0 refills | Status: DC

## 2019-09-12 NOTE — Discharge Instructions (Addendum)
Heat/ice, over the counter extra strength tylenol Continue your current pain medication

## 2019-09-12 NOTE — ED Provider Notes (Signed)
[facility]    CSN: [id number] Arrival date & time: 09/12/19  [place]      History   Chief Complaint Chief Complaint  Patient presents with  . Chest Pain    HPI Bridget Maldonado is a 39 y.o. female.   38 yo female with a c/o right sided chest pain including the breast and radiates into her right axilla for the past 5 days. Pain is worse with movement and certain positions. Patient has been applying hit which relieves the pain. Denies any falls, traumatic injuries, fevers, chills, cough, shortness of breath, rash. Has psoriatic arthritis.    Chest Pain   Past Medical History:  Diagnosis Date  . Anxiety    Possible bipolar, Type II  . Arthritis   . Asthma   . Bipolar disorder ([place])   . BRCA negative 07/2017   vistaseq neg  . Chronic headaches   . Endometriosis 2018  . Family history of breast cancer   . Hemorrhoids 08/2007  . IBS (irritable bowel syndrome) 08/2007  . 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 Increased risk of breast cancer 07/2017   IBIS=40%  . Kidney infection   . LGSIL on Pap smear of cervix   . Migraines   . Multiple allergies   . Obese   . Psoriasis   . Psoriatic arthritis [facility])     Patient Active Problem List   Diagnosis Date Noted  . Bipolar disease, chronic ([place]) 07/27/2018  . Abnormal MRI, breast 09/30/2017  . Family history of colon cancer 07/27/2017  . 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  . History of cervical dysplasia 07/16/2017  . Family history of breast cancer 07/16/2017  . Secondary oligomenorrhea 07/16/2017  . History of endometriosis 07/16/2017  . Psoriatic arthritis ([place]) 10/28/2016  . Chronic low back pain 10/28/2016  . Essential (primary) hypertension 10/18/2014  . Arthritis 11/24/2013  . Arthropathia 11/24/2013  . Obstipation 04/12/2013  . Migraine with status migrainosus 09/08/2012  . 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 Mild persistent asthma 05/26/2011  . Severe anxiety with  panic 08/29/2006  . Panic disorder without agoraphobia 08/29/2006    Past Surgical History:  Procedure Laterality Date  . BREAST BIOPSY Left 8 years  . CESAREAN SECTION    . COLONOSCOPY    . COLONOSCOPY W/ BIOPSIES    . EAR TUBE REMOVAL    . ESOPHAGOGASTRODUODENOSCOPY    . LAPAROSCOPIC CHOLECYSTECTOMY  6/09   Dr. Hulen Skains  . TUBAL LIGATION  2017  . TYMPANOSTOMY TUBE PLACEMENT      OB History    Gravida  2   Para  1   Term  1   Preterm      AB  1   Living  1     SAB      TAB  1   Ectopic      Multiple      Live Births           Obstetric Comments  Menstrual age: 64  Age 1st Pregnancy: 50          Home Medications    Prior to Admission medications   Medication Sig Start Date End Date Taking? Authorizing Provider  albuterol (PROVENTIL HFA;VENTOLIN HFA) 108 (90 Base) MCG/ACT inhaler Inhale 2 puffs into the lungs every 6 (six) hours as needed for wheezing or shortness of breath. 04/16/16  Yes Venia Carbon, MD  ALPRAZolam Duanne Moron) 0.25 MG tablet Take 1 tablet (0.25 mg total) by mouth  2 (two) times daily as needed for anxiety. 08/12/19  Yes Baity, Coralie Keens, NP  ARIPiprazole (ABILIFY) 5 MG tablet Take 1 tablet (5 mg total) by mouth at bedtime. 10/15/18  Yes Venia Carbon, MD  cetirizine (ZYRTEC) 10 MG tablet Take 10 mg by mouth daily.   Yes [provider]  CIMZIA PREFILLED 2 X 200 MG/ML KIT inject 2 pens/syringes (one dose) subcutaneously every 4 weeks 03/07/19  Yes [provider]  esomeprazole (NEXIUM) 40 MG capsule Take 1 capsule (40 mg total) by mouth daily. 03/24/19 03/23/20 Yes Earleen Newport, MD  Fluticasone-Salmeterol (ADVAIR) 250-50 MCG/DOSE AEPB Inhale 1 puff into the lungs 2 (two) times daily. 01/04/19  Yes Venia Carbon, MD  losartan (COZAAR) 50 MG tablet Take 1 tablet (50 mg total) by mouth daily. 04/04/19 09/12/19 Yes Agbor-Etang, Aaron Edelman, MD  meloxicam (MOBIC) 15 MG tablet Take 1 tablet (15 mg total) by mouth daily.  01/31/18  Yes Cuthriell, Charline Bills, PA-C  mometasone (ELOCON) 0.1 % ointment APPLY TWICE DAILY AS NEEDED FOR PSORIASIS 10/15/18  Yes Venia Carbon, MD  traMADol (ULTRAM) 50 MG tablet Take 1 tablet (50 mg total) by mouth every 6 (six) hours as needed for moderate pain or severe pain. 09/03/19 09/02/20 Yes Venia Carbon, MD  DULoxetine (CYMBALTA) 60 MG capsule Take 1 capsule (60 mg total) by mouth daily. 01/22/18   Venia Carbon, MD  predniSONE (DELTASONE) 10 MG tablet Start 60 mg po day one, then 50 mg po day two, taper by 10 mg daily until complete. 09/12/19   Norval Gable, MD  zolpidem (AMBIEN) 10 MG tablet Take 1 tablet (10 mg total) by mouth at bedtime as needed for sleep. 03/24/19   Earleen Newport, MD    Family History Family History  Problem Relation Age of Onset  . Colon polyps Maternal Grandfather   . Parkinson's disease Maternal Grandfather   . Breast cancer Mother 77  . Rheum arthritis Mother   . Irritable bowel syndrome Mother   . Asthma Father   . Anxiety disorder Father        Severe, possible bipolar  . Irritable bowel syndrome Maternal Grandmother   . Diabetes Other        mat. great uncles  . Alzheimer's disease Other        Dad's side  . Colon cancer Other        mat GGF    Social History Social History   Tobacco Use  . Smoking status: Never Smoker  . Smokeless tobacco: Never Used  Substance Use Topics  . Alcohol use: No    Alcohol/week: 0.0 standard drinks  . Drug use: No     Allergies   Sulfa antibiotics, Ciprofloxacin, and Effexor [venlafaxine hcl]   Review of Systems Review of Systems  Cardiovascular: Positive for chest pain.     Physical Exam Triage Vital Signs ED Triage Vitals  Enc Vitals Group     BP 09/12/19 1658 (!) 161/111     Pulse Rate 09/12/19 1658 98     Resp 09/12/19 1658 20     Temp 09/12/19 1658 98.9 F (37.2 C)     Temp Source 09/12/19 1658 Oral     SpO2 09/12/19 1658 100 %     Weight --      Height --       Head Circumference --      Peak Flow --      Pain Score 09/12/19 1653 7  Pain Loc --      Pain Edu? --      Excl. in [place]? --    No data found.  Updated Vital Signs BP (!) 161/111 (BP Location: Left Arm)   Pulse 98   Temp 98.9 F (37.2 C) (Oral)   Resp 20   LMP 11/16/2018   SpO2 100%   Visual Acuity Right Eye Distance:   Left Eye Distance:   Bilateral Distance:    Right Eye Near:   Left Eye Near:    Bilateral Near:     Physical Exam Vitals and nursing note reviewed. Exam conducted with a chaperone present (Melissa, CMA).  Constitutional:      General: She is not in acute distress.    Appearance: She is not toxic-appearing or diaphoretic.  Cardiovascular:     Rate and Rhythm: Normal rate.  Pulmonary:     Effort: Pulmonary effort is normal. No respiratory distress.     Breath sounds: Normal breath sounds.  Chest:     Chest wall: Tenderness (right chest wall) present.     Breasts:        Right: Normal. No swelling, bleeding, inverted nipple, mass, nipple discharge or skin change.   Neurological:     Mental Status: She is alert.      UC Treatments / Results  Labs (all labs ordered are listed, but only abnormal results are displayed) Labs Reviewed - No data to display  EKG   Radiology No results found.  Procedures ED EKG  Date/Time: 09/12/2019 6:30 PM Performed by: Norval Gable, MD Authorized by: Norval Gable, MD   ECG reviewed by ED Physician in the absence of a cardiologist: yes   Previous ECG:    Previous ECG:  Compared to current   Similarity:  No change Interpretation:    Interpretation: abnormal   Rate:    ECG rate:  97   ECG rate assessment: normal   Rhythm:    Rhythm: sinus rhythm   Ectopy:    Ectopy: none   QRS:    QRS axis:  Normal   QRS intervals:  Normal Conduction:    Conduction: normal   ST segments:    ST segments:  Normal T waves:    T waves: normal   Q waves:    Q waves:  V1, V2 and aVR   (including critical  care time)  Medications Ordered in UC Medications - No data to display  Initial Impression / Assessment and Plan / UC Course  I have reviewed the triage vital signs and the nursing notes.  Pertinent labs & imaging results that were available during my care of the patient were reviewed by me and considered in my medical decision making (see chart for details).     Final Clinical Impressions(s) / UC Diagnoses   Final diagnoses:  Right-sided chest wall pain     Discharge Instructions     Heat/ice, over the counter extra strength tylenol Continue your current pain medication     ED Prescriptions    Medication Sig Dispense Auth. Provider   predniSONE (DELTASONE) 10 MG tablet Start 60 mg po day one, then 50 mg po day two, taper by 10 mg daily until complete. 21 tablet Mylie Mccurley, Linward Foster, MD      1. ekg result and diagnosis reviewed with patient 2. rx as per orders above; reviewed possible side effects, interactions, risks and benefits  3. Recommend supportive treatment as above 4. Follow-up prn if symptoms worsen or  don't improve   I have reviewed the PDMP during this encounter.   Norval Gable, MD 09/12/19 [phone]

## 2019-09-12 NOTE — ED Triage Notes (Signed)
Pt states she has pscoriatic arthritis...  C/o pain in sternum that also goes into R breast, around right side and into back.  Has had flare ups in joints but never one that lasted this long.  Ongoing since Thursday.  Has tried heating pad with no relief.  Denies SOB, nausea, dizziness.

## 2019-09-16 ENCOUNTER — Other Ambulatory Visit: Payer: Self-pay

## 2019-09-16 MED ORDER — LOSARTAN POTASSIUM 50 MG PO TABS: 50.0000 mg | ORAL_TABLET | Freq: Every day | ORAL | 0 refills | Status: DC

## 2019-10-03 ENCOUNTER — Other Ambulatory Visit: Payer: Self-pay

## 2019-10-03 MED ORDER — TRAMADOL HCL 50 MG PO TABS: 50.0000 mg | ORAL_TABLET | Freq: Four times a day (QID) | ORAL | 0 refills | Status: DC | PRN

## 2019-10-03 NOTE — Telephone Encounter (Signed)
Last filled 09-02-19 #30 Last OV 10-15-18 Next OV 12-22-19 [place]

## 2019-10-13 ENCOUNTER — Other Ambulatory Visit: Payer: Self-pay

## 2019-10-13 ENCOUNTER — Emergency Department
Admission: EM | Admit: 2019-10-13 | Discharge: 2019-10-13 | Disposition: A | Discharge status: Home / Self Care | Payer: Medicaid Other | Attending: Emergency Medicine | Admitting: Emergency Medicine

## 2019-10-13 DIAGNOSIS — Z5321 Procedure and treatment not carried out due to patient leaving prior to being seen by health care provider: Secondary | ICD-10-CM | POA: Insufficient documentation

## 2019-10-13 DIAGNOSIS — R197 Diarrhea, unspecified: Secondary | ICD-10-CM | POA: Insufficient documentation

## 2019-10-13 DIAGNOSIS — R109 Unspecified abdominal pain: Secondary | ICD-10-CM | POA: Diagnosis present

## 2019-10-13 DIAGNOSIS — R112 Nausea with vomiting, unspecified: Secondary | ICD-10-CM | POA: Diagnosis not present

## 2019-10-13 LAB — COMPREHENSIVE METABOLIC PANEL
ALT: 26 U/L (ref 0–44)
AST: 25 U/L (ref 15–41)
Albumin: 4.1 g/dL (ref 3.5–5.0)
Alkaline Phosphatase: 106 U/L (ref 38–126)
Anion gap: 10 (ref 5–15)
BUN: 16 mg/dL (ref 6–20)
CO2: 21 mmol/L — ABNORMAL LOW (ref 22–32)
Calcium: 8.9 mg/dL (ref 8.9–10.3)
Chloride: 106 mmol/L (ref 98–111)
Creatinine, Ser: 0.65 mg/dL (ref 0.44–1.00)
GFR calc Af Amer: >60 mL/min (ref >60)
GFR calc non Af Amer: >60 mL/min (ref >60)
Glucose, Bld: 127 mg/dL — ABNORMAL HIGH (ref 70–99)
Potassium: 3.4 mmol/L — ABNORMAL LOW (ref 3.5–5.1)
Sodium: 137 mmol/L (ref 135–145)
Total Bilirubin: 1 mg/dL (ref 0.3–1.2)
Total Protein: 7.9 g/dL (ref 6.5–8.1)

## 2019-10-13 LAB — CBC
HCT: 46.7 % — ABNORMAL HIGH (ref 36.0–46.0)
Hemoglobin: 15.5 g/dL — ABNORMAL HIGH (ref 12.0–15.0)
MCH: 28.4 pg (ref 26.0–34.0)
MCHC: 33.2 g/dL (ref 30.0–36.0)
MCV: 85.7 fL (ref 80.0–100.0)
Platelets: 262 10*3/uL (ref 150–400)
RBC: 5.45 MIL/uL — ABNORMAL HIGH (ref 3.87–5.11)
RDW: 12.9 % (ref 11.5–15.5)
WBC: 12.1 10*3/uL — ABNORMAL HIGH (ref 4.0–10.5)
nRBC: 0 % (ref 0.0–0.2)

## 2019-10-13 LAB — LIPASE, BLOOD: Lipase: 25 U/L (ref 11–51)

## 2019-10-13 MED ORDER — SODIUM CHLORIDE 0.9% FLUSH: 3.0000 mL | Freq: Once | INTRAVENOUS | Status: DC

## 2019-10-13 NOTE — ED Triage Notes (Addendum)
Pt comes via POV from home with c/o abdominal pain. Pt also states N/V/D that all started this am.  Pt denies any urinary symptoms. Pt states pain in back and down her legs.

## 2019-10-15 ENCOUNTER — Other Ambulatory Visit: Payer: Self-pay | Admitting: Internal Medicine

## 2019-11-04 ENCOUNTER — Other Ambulatory Visit: Payer: Self-pay

## 2019-11-04 MED ORDER — TRAMADOL HCL 50 MG PO TABS: 50.0000 mg | ORAL_TABLET | Freq: Four times a day (QID) | ORAL | 0 refills | Status: DC | PRN

## 2019-11-04 NOTE — Telephone Encounter (Signed)
Last filled4-12-21 #30 Last OV 10-15-18 Next OV 12-22-19 [place]

## 2019-11-18 ENCOUNTER — Other Ambulatory Visit: Payer: Self-pay | Admitting: Internal Medicine

## 2019-11-18 NOTE — Telephone Encounter (Signed)
Last filled 08-12-19 #60 Last OV 10-15-18 Next OV 12-22-19 [place]

## 2019-11-20 MED ORDER — ALPRAZOLAM 0.25 MG PO TABS: 0.2500 mg | ORAL_TABLET | Freq: Two times a day (BID) | ORAL | 0 refills | Status: DC | PRN

## 2019-11-25 ENCOUNTER — Encounter: Payer: Self-pay | Admitting: Emergency Medicine

## 2019-11-25 ENCOUNTER — Other Ambulatory Visit: Payer: Self-pay

## 2019-11-25 ENCOUNTER — Ambulatory Visit
Admission: EM | Admit: 2019-11-25 | Discharge: 2019-11-25 | Disposition: A | Discharge status: Home / Self Care | Payer: Medicaid Other | Attending: Family Medicine | Admitting: Family Medicine

## 2019-11-25 DIAGNOSIS — J45909 Unspecified asthma, uncomplicated: Secondary | ICD-10-CM | POA: Insufficient documentation

## 2019-11-25 DIAGNOSIS — Z881 Allergy status to other antibiotic agents status: Secondary | ICD-10-CM | POA: Diagnosis not present

## 2019-11-25 DIAGNOSIS — M1990 Unspecified osteoarthritis, unspecified site: Secondary | ICD-10-CM | POA: Diagnosis not present

## 2019-11-25 DIAGNOSIS — Z79899 Other long term (current) drug therapy: Secondary | ICD-10-CM | POA: Diagnosis not present

## 2019-11-25 DIAGNOSIS — F319 Bipolar disorder, unspecified: Secondary | ICD-10-CM | POA: Insufficient documentation

## 2019-11-25 DIAGNOSIS — Z20822 Contact with and (suspected) exposure to covid-19: Secondary | ICD-10-CM | POA: Diagnosis not present

## 2019-11-25 DIAGNOSIS — Z7951 Long term (current) use of inhaled steroids: Secondary | ICD-10-CM | POA: Insufficient documentation

## 2019-11-25 DIAGNOSIS — Z7952 Long term (current) use of systemic steroids: Secondary | ICD-10-CM | POA: Diagnosis not present

## 2019-11-25 DIAGNOSIS — J069 Acute upper respiratory infection, unspecified: Secondary | ICD-10-CM | POA: Diagnosis not present

## 2019-11-25 DIAGNOSIS — F419 Anxiety disorder, unspecified: Secondary | ICD-10-CM | POA: Insufficient documentation

## 2019-11-25 DIAGNOSIS — Z791 Long term (current) use of non-steroidal anti-inflammatories (NSAID): Secondary | ICD-10-CM | POA: Insufficient documentation

## 2019-11-25 LAB — SARS CORONAVIRUS 2 (TAT 6-24 HRS): SARS Coronavirus 2: NEGATIVE

## 2019-11-25 MED ORDER — HYDROCOD POLST-CPM POLST ER 10-8 MG/5ML PO SUER: 5.0000 mL | Freq: Two times a day (BID) | ORAL | 0 refills | Status: DC | PRN

## 2019-11-25 NOTE — ED Triage Notes (Signed)
Patient c/o cough, chest congestion, headaches, and fatigue that started on Wed.  Patient reports fevers.

## 2019-11-25 NOTE — ED Provider Notes (Signed)
[facility]    CSN: [id number] Arrival date & time: 11/25/19  7741      History   Chief Complaint Chief Complaint  Patient presents with   Cough    HPI Bridget Maldonado is a 39 y.o. female.   38 yo female with a c/o cough, chest congestion, runny nose, nasal congestion, fatigue for the past 3 days. States her daughter has been sick with a viral URI. Denies any fevers, chills, chest pains, shortness of breath.    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   Increased risk of breast cancer 07/2017   IBIS=40%   Kidney infection    LGSIL on Pap smear of cervix    Migraines    Multiple allergies    Obese    Psoriasis    Psoriatic arthritis ([place])     Patient Active Problem List   Diagnosis Date Noted   Bipolar disease, chronic ([place]) 07/27/2018   Abnormal MRI, breast 09/30/2017   Family history of colon cancer 07/27/2017   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   History of cervical dysplasia 07/16/2017   Family history of breast cancer 07/16/2017   Secondary oligomenorrhea 07/16/2017   History of endometriosis 07/16/2017   Psoriatic arthritis ([place]) 10/28/2016   Chronic low back pain 10/28/2016   Essential (primary) hypertension 10/18/2014   Arthritis 11/24/2013   Arthropathia 11/24/2013   Obstipation 04/12/2013   Migraine with status migrainosus 09/08/2012   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 Hulen Skains   TUBAL LIGATION  2017   TYMPANOSTOMY TUBE PLACEMENT      OB History    Gravida  2   Para  1   Term  1   Preterm      AB  1   Living  1     SAB      TAB  1   Ectopic      Multiple      Live Births           Obstetric Comments  Menstrual age: 22  Age 1st Pregnancy: 51          Home Medications    Prior to Admission medications   Medication Sig Start Date End Date Taking? Authorizing Provider  albuterol (PROVENTIL HFA;VENTOLIN HFA) 108 (90 Base) MCG/ACT inhaler Inhale 2 puffs into the lungs every 6 (six) hours as needed for wheezing or shortness of breath. 04/16/16  Yes Venia Carbon, MD  ALPRAZolam Duanne Moron) 0.25 MG tablet Take 1 tablet (0.25 mg total) by mouth 2 (two) times daily as needed for anxiety. 11/20/19  Yes Venia Carbon, MD  ARIPiprazole (ABILIFY) 5 MG  tablet TAKE 1 TABLET BY MOUTH AT BEDTIME 10/17/19  Yes Venia Carbon, MD  cetirizine (ZYRTEC) 10 MG tablet Take 10 mg by mouth daily.   Yes [provider]  Fluticasone-Salmeterol (ADVAIR) 250-50 MCG/DOSE AEPB Inhale 1 puff into the lungs 2 (two) times daily. 01/04/19  Yes Venia Carbon, MD  losartan (COZAAR) 50 MG tablet Take 1 tablet (50 mg total) by mouth daily. 09/16/19 12/15/19 Yes Agbor-Etang, Aaron Edelman, MD  meloxicam (MOBIC) 15 MG tablet Take 1 tablet (15 mg total) by mouth daily. 01/31/18  Yes Cuthriell, Charline Bills, PA-C  traMADol (ULTRAM) 50 MG tablet Take 1 tablet (50 mg total) by mouth every 6 (six) hours as needed for moderate pain or severe pain. 11/04/19 11/03/20 Yes Venia Carbon, MD  chlorpheniramine-HYDROcodone (TUSSIONEX PENNKINETIC ER) 10-8 MG/5ML SUER Take 5 mLs by mouth every 12 (twelve) hours as needed. 11/25/19   Norval Gable, MD  CIMZIA PREFILLED 2 X 200 MG/ML KIT  inject 2 pens/syringes (one dose) subcutaneously every 4 weeks 03/07/19   [provider]  DULoxetine (CYMBALTA) 60 MG capsule Take 1 capsule (60 mg total) by mouth daily. 01/22/18   Venia Carbon, MD  esomeprazole (NEXIUM) 40 MG capsule Take 1 capsule (40 mg total) by mouth daily. 03/24/19 03/23/20  Earleen Newport, MD  mometasone (ELOCON) 0.1 % ointment APPLY TWICE DAILY AS NEEDED FOR PSORIASIS 10/15/18   Venia Carbon, MD  predniSONE (DELTASONE) 10 MG tablet Start 60 mg po day one, then 50 mg po day two, taper by 10 mg daily until complete. 09/12/19   Norval Gable, MD  zolpidem (AMBIEN) 10 MG tablet Take 1 tablet (10 mg total) by mouth at bedtime as needed for sleep. 03/24/19   Earleen Newport, MD    Family History Family History  Problem Relation Age of Onset   Colon polyps Maternal Grandfather    Parkinson's disease Maternal Grandfather    Breast cancer Mother 65   Rheum arthritis Mother    Irritable bowel syndrome Mother    Asthma Father    Anxiety disorder Father        Severe, possible bipolar   Irritable bowel syndrome Maternal Grandmother    Diabetes Other        mat. great uncles   Alzheimer's disease Other        Dad's side   Colon cancer Other        mat GGF    Social History Social History   Tobacco Use   Smoking status: Never Smoker   Smokeless tobacco: Never Used  Substance Use Topics   Alcohol use: No    Alcohol/week: 0.0 standard drinks   Drug use: No     Allergies   Sulfa antibiotics, Ciprofloxacin, and Effexor [venlafaxine hcl]   Review of Systems Review of Systems  Respiratory: Positive for cough.      Physical Exam Triage Vital Signs ED Triage Vitals  Enc Vitals Group     BP 11/25/19 0850 (!) 150/116     Pulse Rate 11/25/19 0850 (!) 117     Resp 11/25/19 0850 16     Temp 11/25/19 0850 98.4 F (36.9 C)     Temp Source 11/25/19 0850 Oral     SpO2 11/25/19 0850 99 %     Weight 11/25/19 0845 200 lb  (90.7 kg)     Height 11/25/19 0845 '5\' 4"'  (1.626 m)     Head Circumference --      Peak Flow --  Pain Score 11/25/19 0845 3     Pain Loc --      Pain Edu? --      Excl. in [place]? --    No data found.  Updated Vital Signs BP (!) 139/106 (BP Location: Right Arm)    Pulse (!) 106    Temp 98.4 F (36.9 C) (Oral)    Resp 16    Ht '5\' 4"'  (1.626 m)    Wt 90.7 kg    LMP 11/16/2018    SpO2 99%    BMI 34.33 kg/m   Visual Acuity Right Eye Distance:   Left Eye Distance:   Bilateral Distance:    Right Eye Near:   Left Eye Near:    Bilateral Near:     Physical Exam Vitals and nursing note reviewed.  Constitutional:      General: She is not in acute distress.    Appearance: She is not toxic-appearing or diaphoretic.  HENT:     Right Ear: Tympanic membrane normal.     Left Ear: Tympanic membrane normal.     Nose: Congestion and rhinorrhea present.  Cardiovascular:     Rate and Rhythm: Normal rate.     Heart sounds: Normal heart sounds.  Pulmonary:     Effort: Pulmonary effort is normal. No respiratory distress.     Breath sounds: Normal breath sounds. No stridor. No wheezing, rhonchi or rales.  Skin:    Findings: No rash.  Neurological:     Mental Status: She is alert.      UC Treatments / Results  Labs (all labs ordered are listed, but only abnormal results are displayed) Labs Reviewed  SARS CORONAVIRUS 2 (TAT 6-24 HRS)    EKG   Radiology No results found.  Procedures Procedures (including critical care time)  Medications Ordered in UC Medications - No data to display  Initial Impression / Assessment and Plan / UC Course  I have reviewed the triage vital signs and the nursing notes.  Pertinent labs & imaging results that were available during my care of the patient were reviewed by me and considered in my medical decision making (see chart for details).      Final Clinical Impressions(s) / UC Diagnoses   Final diagnoses:  Viral URI with cough    ED  Prescriptions    Medication Sig Dispense Auth. Provider   chlorpheniramine-HYDROcodone (TUSSIONEX PENNKINETIC ER) 10-8 MG/5ML SUER Take 5 mLs by mouth every 12 (twelve) hours as needed. 60 mL Norval Gable, MD      1. diagnosis reviewed with patient 2. rx as per orders above; reviewed possible side effects, interactions, risks and benefits  3. Recommend supportive treatment with rest, fluids 4. Await covid test  5. Follow-up prn if symptoms worsen or don't improve   I have reviewed the PDMP during this encounter.   Norval Gable, MD 11/25/19 1157

## 2019-12-16 ENCOUNTER — Other Ambulatory Visit: Payer: Self-pay

## 2019-12-16 MED ORDER — TRAMADOL HCL 50 MG PO TABS: 50.0000 mg | ORAL_TABLET | Freq: Four times a day (QID) | ORAL | 0 refills | Status: DC | PRN

## 2019-12-16 NOTE — Telephone Encounter (Signed)
Last filled5-14-21 #30 Last OV 10-15-18 Next OV 12-22-19 [place]

## 2019-12-22 ENCOUNTER — Encounter: Payer: Medicaid Other | Admitting: Internal Medicine

## 2019-12-29 ENCOUNTER — Telehealth: Payer: Self-pay

## 2019-12-29 NOTE — Telephone Encounter (Signed)
PA for tramadol filled out on CoverMyMeds. Waiting for response back

## 2020-01-18 ENCOUNTER — Other Ambulatory Visit: Payer: Self-pay

## 2020-01-19 MED ORDER — TRAMADOL HCL 50 MG PO TABS: 50.0000 mg | ORAL_TABLET | Freq: Four times a day (QID) | ORAL | 0 refills | Status: DC | PRN

## 2020-01-19 NOTE — Telephone Encounter (Signed)
Last filled6-25-21 #30 Last OV 10-15-18 Next OV 02-23-20 [place]

## 2020-02-13 ENCOUNTER — Ambulatory Visit
Admission: EM | Admit: 2020-02-13 | Discharge: 2020-02-13 | Disposition: A | Discharge status: Home / Self Care | Payer: Medicaid Other | Attending: Emergency Medicine | Admitting: Emergency Medicine

## 2020-02-13 DIAGNOSIS — J069 Acute upper respiratory infection, unspecified: Secondary | ICD-10-CM | POA: Diagnosis not present

## 2020-02-13 DIAGNOSIS — R05 Cough: Secondary | ICD-10-CM | POA: Diagnosis not present

## 2020-02-13 DIAGNOSIS — R059 Cough, unspecified: Secondary | ICD-10-CM

## 2020-02-13 DIAGNOSIS — I10 Essential (primary) hypertension: Secondary | ICD-10-CM

## 2020-02-13 NOTE — Discharge Instructions (Addendum)
Your COVID test is pending.  You should self quarantine until the test result is back.    Take Tylenol as needed for fever or discomfort.  Rest and keep yourself hydrated.    Go to the emergency department if you develop acute worsening symptoms.    Your blood pressure is elevated today at 141/99 and 157/108.  Please have this rechecked by your primary care provider in 1-2 weeks.

## 2020-02-13 NOTE — ED Provider Notes (Signed)
Roderic Palau    CSN: [id number] Arrival date & time: 02/13/20  1419      History   Chief Complaint Chief Complaint  Patient presents with   Nasal Congestion   Cough   Headache   Hypertension    HPI Bridget Maldonado is a 39 y.o. female.   Patient presents with fatigue, headache, nasal congestion x2 days.  She also reports a nonproductive cough.  She denies fever, chills, sore throat, shortness of breath, abdominal pain, vomiting, diarrhea, or other symptoms.  Treatment at home with ibuprofen.  The history is provided by the patient.    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   Increased risk of breast cancer 07/2017   IBIS=40%   Kidney infection    LGSIL on Pap smear of cervix    Migraines    Multiple allergies    Obese    Psoriasis    Psoriatic arthritis ([facility])     Patient Active Problem List   Diagnosis Date Noted   Bipolar disease, chronic ([place]) 07/27/2018   Abnormal MRI, breast 09/30/2017   Family history of colon cancer 07/27/2017   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   History of cervical dysplasia 07/16/2017   Family history of breast cancer 07/16/2017   Secondary oligomenorrhea 07/16/2017   History of endometriosis 07/16/2017   Psoriatic arthritis ([place]) 10/28/2016   Chronic low back pain 10/28/2016   Essential (primary) hypertension 10/18/2014   Arthritis 11/24/2013   Arthropathia 11/24/2013   Obstipation 04/12/2013   Migraine with status migrainosus 09/08/2012   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 Hulen Skains   TUBAL LIGATION  2017   TYMPANOSTOMY TUBE PLACEMENT      OB History    Gravida  2   Para  1   Term  1   Preterm      AB  1   Living  1     SAB      TAB  1   Ectopic      Multiple      Live Births           Obstetric Comments  Menstrual age: 63  Age 1st Pregnancy: 20          Home Medications    Prior to Admission medications   Medication Sig Start Date End Date Taking? Authorizing Provider  albuterol (PROVENTIL HFA;VENTOLIN HFA) 108 (90 Base) MCG/ACT inhaler Inhale 2 puffs into the lungs every 6 (six) hours as needed for wheezing or shortness of breath. 04/16/16   Venia Carbon, MD  ALPRAZolam Duanne Moron) 0.25 MG tablet Take 1 tablet (0.25 mg total) by mouth 2 (two) times  daily as needed for anxiety. 11/20/19   Venia Carbon, MD  ARIPiprazole (ABILIFY) 5 MG tablet TAKE 1 TABLET BY MOUTH AT BEDTIME 10/17/19   Viviana Simpler I, MD  cetirizine (ZYRTEC) 10 MG tablet Take 10 mg by mouth daily.    [provider]  chlorpheniramine-HYDROcodone (TUSSIONEX PENNKINETIC ER) 10-8 MG/5ML SUER Take 5 mLs by mouth every 12 (twelve) hours as needed. 11/25/19   Norval Gable, MD  CIMZIA PREFILLED 2 X 200 MG/ML KIT inject 2 pens/syringes (one dose) subcutaneously every 4 weeks 03/07/19   [provider]  DULoxetine (CYMBALTA) 60 MG capsule Take 1 capsule (60 mg total) by mouth daily. 01/22/18   Venia Carbon, MD  esomeprazole (NEXIUM) 40 MG capsule Take 1 capsule (40 mg total) by mouth daily. 03/24/19 03/23/20  Earleen Newport, MD  Fluticasone-Salmeterol (ADVAIR) 250-50 MCG/DOSE AEPB Inhale 1 puff into the lungs 2 (two) times daily. 01/04/19    Venia Carbon, MD  losartan (COZAAR) 50 MG tablet Take 1 tablet (50 mg total) by mouth daily. 09/16/19 12/15/19  Kate Sable, MD  meloxicam (MOBIC) 15 MG tablet Take 1 tablet (15 mg total) by mouth daily. 01/31/18   Cuthriell, Charline Bills, PA-C  mometasone (ELOCON) 0.1 % ointment APPLY TWICE DAILY AS NEEDED FOR PSORIASIS 10/15/18   Venia Carbon, MD  predniSONE (DELTASONE) 10 MG tablet Start 60 mg po day one, then 50 mg po day two, taper by 10 mg daily until complete. 09/12/19   Norval Gable, MD  traMADol (ULTRAM) 50 MG tablet Take 1 tablet (50 mg total) by mouth every 6 (six) hours as needed for moderate pain or severe pain. 01/19/20 01/18/21  Venia Carbon, MD  zolpidem (AMBIEN) 10 MG tablet Take 1 tablet (10 mg total) by mouth at bedtime as needed for sleep. 03/24/19   Earleen Newport, MD    Family History Family History  Problem Relation Age of Onset   Colon polyps Maternal Grandfather    Parkinson's disease Maternal Grandfather    Breast cancer Mother 18   Rheum arthritis Mother    Irritable bowel syndrome Mother    Asthma Father    Anxiety disorder Father        Severe, possible bipolar   Irritable bowel syndrome Maternal Grandmother    Diabetes Other        mat. great uncles   Alzheimer's disease Other        Dad's side   Colon cancer Other        mat GGF    Social History Social History   Tobacco Use   Smoking status: Never Smoker   Smokeless tobacco: Never Used  Vaping Use   Vaping Use: Never used  Substance Use Topics   Alcohol use: No    Alcohol/week: 0.0 standard drinks   Drug use: No     Allergies   Sulfa antibiotics, Ciprofloxacin, and Effexor [venlafaxine hcl]   Review of Systems Review of Systems  Constitutional: Positive for fatigue. Negative for chills and fever.  HENT: Positive for congestion. Negative for ear pain and sore throat.   Eyes: Negative for pain and visual disturbance.  Respiratory: Positive for  cough. Negative for shortness of breath.   Cardiovascular: Negative for chest pain and palpitations.  Gastrointestinal: Negative for abdominal pain, diarrhea and vomiting.  Genitourinary: Negative for dysuria and hematuria.  Musculoskeletal: Negative for arthralgias and back pain.  Skin: Negative for color change and rash.  Neurological: Positive for headaches. Negative  for dizziness, seizures, syncope, facial asymmetry, speech difficulty, weakness and numbness.  All other systems reviewed and are negative.    Physical Exam Triage Vital Signs ED Triage Vitals [02/13/20 1428]  Enc Vitals Group     BP (!) 141/99     Pulse Rate (!) 102     Resp 16     Temp 99.3 F (37.4 C)     Temp Source Oral     SpO2 98 %     Weight      Height      Head Circumference      Peak Flow      Pain Score      Pain Loc      Pain Edu?      Excl. in [place]?    No data found.  Updated Vital Signs BP (!) 157/108 (BP Location: Left Arm)    Pulse (!) 106    Temp 98.3 F (36.8 C) (Temporal)    Resp 20    LMP 11/16/2018    SpO2 96%   Visual Acuity Right Eye Distance:   Left Eye Distance:   Bilateral Distance:    Right Eye Near:   Left Eye Near:    Bilateral Near:     Physical Exam Vitals and nursing note reviewed.  Constitutional:      General: She is not in acute distress.    Appearance: She is well-developed. She is not ill-appearing.  HENT:     Head: Normocephalic and atraumatic.     Right Ear: Tympanic membrane normal.     Left Ear: Tympanic membrane normal.     Nose: Nose normal.     Mouth/Throat:     Mouth: Mucous membranes are moist.     Pharynx: Oropharynx is clear.  Eyes:     Conjunctiva/sclera: Conjunctivae normal.  Cardiovascular:     Rate and Rhythm: Normal rate and regular rhythm.     Heart sounds: No murmur heard.   Pulmonary:     Effort: Pulmonary effort is normal. No respiratory distress.     Breath sounds: Normal breath sounds. No wheezing or rhonchi.  Abdominal:      Palpations: Abdomen is soft.     Tenderness: There is no abdominal tenderness. There is no guarding or rebound.  Musculoskeletal:     Cervical back: Neck supple.  Skin:    General: Skin is warm and dry.     Findings: No rash.  Neurological:     General: No focal deficit present.     Mental Status: She is alert and oriented to person, place, and time.     Gait: Gait normal.  Psychiatric:        Mood and Affect: Mood normal.        Behavior: Behavior normal.      UC Treatments / Results  Labs (all labs ordered are listed, but only abnormal results are displayed) Labs Reviewed  NOVEL CORONAVIRUS, NAA    EKG   Radiology No results found.  Procedures Procedures (including critical care time)  Medications Ordered in UC Medications - No data to display  Initial Impression / Assessment and Plan / UC Course  I have reviewed the triage vital signs and the nursing notes.  Pertinent labs & imaging results that were available during my care of the patient were reviewed by me and considered in my medical decision making (see chart for details).   Viral URI with cough.  Elevated blood pressure reading with known  HTN.  COVID test performed here.  Instructed patient to self quarantine until the test result is back.  Discussed with patient that she can take Tylenol as needed for fever or discomfort.  Instructed patient to go to the emergency department if she develops high fever, shortness of breath, severe diarrhea, or other concerning symptoms.  Discussed with patient that her blood pressure is elevated today needs to be rechecked by her PCP in 1 to 2 weeks.  Patient agrees with plan of care.     Final Clinical Impressions(s) / UC Diagnoses   Final diagnoses:  Viral URI with cough  Elevated blood pressure reading in office with diagnosis of hypertension     Discharge Instructions     Your COVID test is pending.  You should self quarantine until the test result is back.     Take Tylenol as needed for fever or discomfort.  Rest and keep yourself hydrated.    Go to the emergency department if you develop acute worsening symptoms.    Your blood pressure is elevated today at 141/99 and 157/108.  Please have this rechecked by your primary care provider in 1-2 weeks.           ED Prescriptions    None     PDMP not reviewed this encounter.   Sharion Balloon, NP 02/13/20 1450

## 2020-02-13 NOTE — ED Triage Notes (Signed)
Pt presents with headache and nasal congestion x 2 days; cough x 1 day. Pt denies chest pain, SOB, weakness, dizziness. Pt taking ibuprofen, last dose 4 hrs ago.

## 2020-02-15 LAB — NOVEL CORONAVIRUS, NAA: SARS-CoV-2, NAA: NOT DETECTED

## 2020-02-15 LAB — SARS-COV-2, NAA 2 DAY TAT

## 2020-02-23 ENCOUNTER — Encounter: Payer: Medicaid Other | Admitting: Internal Medicine

## 2020-02-24 ENCOUNTER — Other Ambulatory Visit: Payer: Self-pay

## 2020-02-24 NOTE — Telephone Encounter (Signed)
Last filled7-29-21 #30 Last OV 10-15-18 Next OV 06-13-20 [place]

## 2020-02-26 MED ORDER — TRAMADOL HCL 50 MG PO TABS: 50.0000 mg | ORAL_TABLET | Freq: Four times a day (QID) | ORAL | 0 refills | Status: DC | PRN

## 2020-03-27 ENCOUNTER — Other Ambulatory Visit: Payer: Self-pay

## 2020-03-27 NOTE — Telephone Encounter (Signed)
Last filled 11-20-19 #60 Last OV 10-15-18 Next OV 06-13-20 [place]

## 2020-03-28 MED ORDER — ALPRAZOLAM 0.25 MG PO TABS: 0.2500 mg | ORAL_TABLET | Freq: Two times a day (BID) | ORAL | 0 refills | Status: DC | PRN

## 2020-04-06 ENCOUNTER — Other Ambulatory Visit: Payer: Self-pay

## 2020-04-07 NOTE — Telephone Encounter (Signed)
Last filled9-3-21 #30 Last OV 10-15-18 Next OV12-22-21 [place]

## 2020-04-08 MED ORDER — TRAMADOL HCL 50 MG PO TABS: 50.0000 mg | ORAL_TABLET | Freq: Four times a day (QID) | ORAL | 0 refills | Status: DC | PRN

## 2020-05-09 ENCOUNTER — Other Ambulatory Visit: Payer: Self-pay

## 2020-05-09 MED ORDER — TRAMADOL HCL 50 MG PO TABS: 50.0000 mg | ORAL_TABLET | Freq: Four times a day (QID) | ORAL | 0 refills | Status: DC | PRN

## 2020-05-09 NOTE — Telephone Encounter (Signed)
Last filled10-17-21#30 Last OV 10-15-18 Next OV12-22-21 [place]

## 2020-06-07 ENCOUNTER — Other Ambulatory Visit: Payer: Self-pay

## 2020-06-07 MED ORDER — TRAMADOL HCL 50 MG PO TABS: 50.0000 mg | ORAL_TABLET | Freq: Four times a day (QID) | ORAL | 0 refills | Status: DC | PRN

## 2020-06-07 NOTE — Telephone Encounter (Signed)
Last filled11-17-21#30 Last OV 10-15-18 Next OV12-22-21 [place]

## 2020-06-13 ENCOUNTER — Encounter: Payer: Self-pay | Admitting: Internal Medicine

## 2020-06-13 ENCOUNTER — Ambulatory Visit (INDEPENDENT_AMBULATORY_CARE_PROVIDER_SITE_OTHER): Payer: Medicaid Other | Admitting: Internal Medicine

## 2020-06-13 ENCOUNTER — Other Ambulatory Visit: Payer: Self-pay

## 2020-06-13 VITALS — BP 128/90 | HR 95 | Temp 97.8°F | Ht 65.0 in | Wt 210.0 lb

## 2020-06-13 DIAGNOSIS — L4050 Arthropathic psoriasis, unspecified: Secondary | ICD-10-CM | POA: Diagnosis not present

## 2020-06-13 DIAGNOSIS — J4530 Mild persistent asthma, uncomplicated: Secondary | ICD-10-CM

## 2020-06-13 DIAGNOSIS — I10 Essential (primary) hypertension: Secondary | ICD-10-CM

## 2020-06-13 DIAGNOSIS — Z0000 Encounter for general adult medical examination without abnormal findings: Secondary | ICD-10-CM | POA: Insufficient documentation

## 2020-06-13 DIAGNOSIS — F319 Bipolar disorder, unspecified: Secondary | ICD-10-CM | POA: Diagnosis not present

## 2020-06-13 DIAGNOSIS — Z23 Encounter for immunization: Secondary | ICD-10-CM | POA: Diagnosis not present

## 2020-06-13 LAB — LIPID PANEL
Cholesterol: 205 mg/dL — ABNORMAL HIGH (ref 0–200)
HDL: 49.7 mg/dL (ref >39.00)
LDL Cholesterol: 138 mg/dL — ABNORMAL HIGH (ref 0–99)
NonHDL: 155.21
Total CHOL/HDL Ratio: 4
Triglycerides: 88 mg/dL (ref 0.0–149.0)
VLDL: 17.6 mg/dL (ref 0.0–40.0)

## 2020-06-13 LAB — CBC
HCT: 44 % (ref 36.0–46.0)
Hemoglobin: 14.5 g/dL (ref 12.0–15.0)
MCHC: 32.9 g/dL (ref 30.0–36.0)
MCV: 85 fl (ref 78.0–100.0)
Platelets: 277 10*3/uL (ref 150.0–400.0)
RBC: 5.17 Mil/uL — ABNORMAL HIGH (ref 3.87–5.11)
RDW: 13 % (ref 11.5–15.5)
WBC: 7.4 10*3/uL (ref 4.0–10.5)

## 2020-06-13 LAB — COMPREHENSIVE METABOLIC PANEL
ALT: 30 U/L (ref 0–35)
AST: 21 U/L (ref 0–37)
Albumin: 4 g/dL (ref 3.5–5.2)
Alkaline Phosphatase: 131 U/L — ABNORMAL HIGH (ref 39–117)
BUN: 10 mg/dL (ref 6–23)
CO2: 28 mEq/L (ref 19–32)
Calcium: 9.1 mg/dL (ref 8.4–10.5)
Chloride: 103 mEq/L (ref 96–112)
Creatinine, Ser: 0.71 mg/dL (ref 0.40–1.20)
GFR: 108.62 mL/min (ref >60.00)
Glucose, Bld: 85 mg/dL (ref 70–99)
Potassium: 4.2 mEq/L (ref 3.5–5.1)
Sodium: 136 mEq/L (ref 135–145)
Total Bilirubin: 0.4 mg/dL (ref 0.2–1.2)
Total Protein: 7.6 g/dL (ref 6.0–8.3)

## 2020-06-13 LAB — SEDIMENTATION RATE: Sed Rate: 23 mm/hr — ABNORMAL HIGH (ref 0–20)

## 2020-06-13 MED ORDER — ESOMEPRAZOLE MAGNESIUM 40 MG PO CPDR: 40.0000 mg | DELAYED_RELEASE_CAPSULE | Freq: Every day | ORAL | 0 refills | Status: DC | PRN

## 2020-06-13 MED ORDER — ARIPIPRAZOLE 5 MG PO TABS: 5.0000 mg | ORAL_TABLET | Freq: Every day | ORAL | 3 refills | Status: DC

## 2020-06-13 MED ORDER — LOSARTAN POTASSIUM 50 MG PO TABS: 50.0000 mg | ORAL_TABLET | Freq: Every day | ORAL | 3 refills | Status: DC

## 2020-06-13 MED ORDER — FLUTICASONE-SALMETEROL 250-50 MCG/DOSE IN AEPB: 1.0000 | INHALATION_SPRAY | Freq: Two times a day (BID) | RESPIRATORY_TRACT | 3 refills | Status: DC

## 2020-06-13 MED ORDER — DULOXETINE HCL 60 MG PO CPEP: 60.0000 mg | ORAL_CAPSULE | Freq: Every day | ORAL | 3 refills | Status: DC

## 2020-06-13 MED ORDER — ALPRAZOLAM 0.25 MG PO TABS: 0.2500 mg | ORAL_TABLET | Freq: Two times a day (BID) | ORAL | 0 refills | Status: DC | PRN

## 2020-06-13 NOTE — Assessment & Plan Note (Signed)
More anxious again now Was off the duloxetine--will restart (and continue abilify)

## 2020-06-13 NOTE — Assessment & Plan Note (Signed)
BP Readings from Last 3 Encounters:  06/13/20 128/90  02/13/20 (!) 157/108  11/25/19 (!) 139/106   Will restart the losartan

## 2020-06-13 NOTE — Assessment & Plan Note (Signed)
Flu vaccine today COVID vaccine ASAP--Moderna or Pfizer Is going to restart at the [facility] Pap per gyn

## 2020-06-13 NOTE — Assessment & Plan Note (Signed)
Does okay with the advair

## 2020-06-13 NOTE — Assessment & Plan Note (Signed)
On the cimzia with fair control

## 2020-06-13 NOTE — Progress Notes (Signed)
Subjective:    Patient ID: Bridget Maldonado, female    DOB: [birth year], 39 y.o.   MRN: [id number]  HPI Here for physical This visit occurred during the SARS-CoV-2 public health emergency.  Safety protocols were in place, including screening questions prior to the visit, additional usage of staff PPE, and extensive cleaning of exam room while observing appropriate contact time as indicated for disinfecting solutions.   Thinks she may need adjustment in depression medication Anxiety and panic have increased Feels aggressive at times No clear stressors to cause this (typical child issues--now in kindergarten) Feels overwhelmed, irritable at times Loves her job Does take the abilify--but not on the cymbalta for some time  Flares with psoriatic arthritis--on shots Also IBS flares  Current Outpatient Medications on File Prior to Visit  Medication Sig Dispense Refill  . albuterol (PROVENTIL HFA;VENTOLIN HFA) 108 (90 Base) MCG/ACT inhaler Inhale 2 puffs into the lungs every 6 (six) hours as needed for wheezing or shortness of breath. 1 Inhaler 1  . ALPRAZolam (XANAX) 0.25 MG tablet Take 1 tablet (0.25 mg total) by mouth 2 (two) times daily as needed for anxiety. 60 tablet 0  . ARIPiprazole (ABILIFY) 5 MG tablet TAKE 1 TABLET BY MOUTH AT BEDTIME 90 tablet 0  . cetirizine (ZYRTEC) 10 MG tablet Take 10 mg by mouth daily.    Marland Kitchen CIMZIA PREFILLED 2 X 200 MG/ML KIT inject 2 pens/syringes (one dose) subcutaneously every 4 weeks    . Fluticasone-Salmeterol (ADVAIR) 250-50 MCG/DOSE AEPB Inhale 1 puff into the lungs 2 (two) times daily. 60 each 5  . meloxicam (MOBIC) 15 MG tablet Take 1 tablet (15 mg total) by mouth daily. 30 tablet 0  . mometasone (ELOCON) 0.1 % ointment APPLY TWICE DAILY AS NEEDED FOR PSORIASIS 45 g 1  . traMADol (ULTRAM) 50 MG tablet Take 1 tablet (50 mg total) by mouth every 6 (six) hours as needed for moderate pain or severe pain. 30 tablet 0  . DULoxetine (CYMBALTA) 60 MG capsule  Take 1 capsule (60 mg total) by mouth daily. (Patient not taking: Reported on 06/13/2020) 90 capsule 3  . esomeprazole (NEXIUM) 40 MG capsule Take 1 capsule (40 mg total) by mouth daily. 30 capsule 1  . losartan (COZAAR) 50 MG tablet Take 1 tablet (50 mg total) by mouth daily. 30 tablet 0   No current facility-administered medications on file prior to visit.    Allergies  Allergen Reactions  . Sulfa Antibiotics Nausea And Vomiting  . Ciprofloxacin Nausea And Vomiting  . Effexor [Venlafaxine Hcl] Other (See Comments)    Reaction:  Made pt sleep for two days straight.    Past Medical History:  Diagnosis Date  . Anxiety    Possible bipolar, Type II  . Arthritis   . Asthma   . Bipolar disorder ([place])   . BRCA negative 07/2017   vistaseq neg  . Chronic headaches   . Endometriosis 2018  . Family history of breast cancer   . Hemorrhoids 08/2007  . IBS (irritable bowel syndrome) 08/2007  . 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 Increased risk of breast cancer 07/2017   IBIS=40%  . Kidney infection   . LGSIL on Pap smear of cervix   . Migraines   . Multiple allergies   . Obese   . Psoriasis   . Psoriatic arthritis [facility])     Past Surgical History:  Procedure Laterality Date  . BREAST BIOPSY Left 8 years  . CESAREAN SECTION    .  COLONOSCOPY    . COLONOSCOPY W/ BIOPSIES    . EAR TUBE REMOVAL    . ESOPHAGOGASTRODUODENOSCOPY    . LAPAROSCOPIC CHOLECYSTECTOMY  6/09   Dr. Hulen Skains  . TUBAL LIGATION  2017  . TYMPANOSTOMY TUBE PLACEMENT      Family History  Problem Relation Age of Onset  . Colon polyps Maternal Grandfather   . Parkinson's disease Maternal Grandfather   . Breast cancer Mother 30  . Rheum arthritis Mother   . Irritable bowel syndrome Mother   . Asthma Father   . Anxiety disorder Father        Severe, possible bipolar  . Irritable bowel syndrome Maternal Grandmother   . Diabetes Other        mat. great uncles  . Alzheimer's disease Other        Dad's side  .  Colon cancer Other        mat GGF    Social History   Socioeconomic History  . Marital status: Single    Spouse name: Not on file  . Number of children: 1  . Years of education: Not on file  . Highest education level: Not on file  Occupational History  . Occupation: Office work    Comment: [employer]  Tobacco Use  . Smoking status: Never Smoker  . Smokeless tobacco: Never Used  Vaping Use  . Vaping Use: Never used  Substance and Sexual Activity  . Alcohol use: No    Alcohol/week: 0.0 standard drinks  . Drug use: No  . Sexual activity: Yes    Birth control/protection: Surgical  Other Topics Concern  . Not on file  Social History Narrative  . Not on file   Social Determinants of Health   Financial Resource Strain: Not on file  Food Insecurity: Not on file  Transportation Needs: Not on file  Physical Activity: Not on file  Stress: Not on file  Social Connections: Not on file  Intimate Partner Violence: Not on file   Review of Systems  Constitutional:       Plans to start at [employer] Weight is up some Wears seat belt  HENT: Negative for dental problem, tinnitus and trouble swallowing.        Some hearing loss on left---wax? Overdue for dentist  Eyes: Negative for visual disturbance.       No diplopia or unilateral vision loss  Respiratory: Positive for cough and wheezing. Negative for shortness of breath.        Albuterol helps prn  Cardiovascular: Negative for leg swelling.       Gets chest tightness and palpitations with anxiety  Gastrointestinal: Positive for constipation and diarrhea. Negative for blood in stool.       Hasn't needed the nexium reguarly  Endocrine: Negative for polydipsia and polyuria.  Genitourinary: Negative for difficulty urinating and dysuria.       No periods now (menopausal due to MTX) Had tubal ligation   Musculoskeletal: Positive for arthralgias, back pain and joint swelling.  Skin:       Injection helping psoriasis   Allergic/Immunologic: Positive for environmental allergies. Negative for immunocompromised state.       Regular with meds---quiet Some sinus infections  Neurological: Positive for headaches. Negative for dizziness, syncope and light-headedness.  Hematological: Negative for adenopathy. Does not bruise/bleed easily.  Psychiatric/Behavioral: Positive for dysphoric mood and sleep disturbance. The patient is nervous/anxious.        Doesn't want to leave house--but does for daughter  Objective:   Physical Exam Constitutional:      Appearance: Normal appearance.  HENT:     Right Ear: Tympanic membrane, ear canal and external ear normal.     Left Ear: Tympanic membrane, ear canal and external ear normal.     Mouth/Throat:     Pharynx: No oropharyngeal exudate or posterior oropharyngeal erythema.  Eyes:     Conjunctiva/sclera: Conjunctivae normal.     Pupils: Pupils are equal, round, and reactive to light.  Cardiovascular:     Rate and Rhythm: Normal rate and regular rhythm.     Pulses: Normal pulses.     Heart sounds: No murmur heard. No gallop.   Pulmonary:     Effort: Pulmonary effort is normal.     Breath sounds: Normal breath sounds. No wheezing or rales.  Abdominal:     Palpations: Abdomen is soft.     Tenderness: There is no abdominal tenderness.  Musculoskeletal:     Cervical back: Neck supple.     Right lower leg: No edema.     Left lower leg: No edema.  Lymphadenopathy:     Cervical: No cervical adenopathy.  Skin:    General: Skin is warm.     Findings: No rash.  Neurological:     General: No focal deficit present.     Mental Status: She is alert and oriented to person, place, and time.  Psychiatric:        Mood and Affect: Mood normal.        Behavior: Behavior normal.            Assessment & Plan:

## 2020-07-05 ENCOUNTER — Other Ambulatory Visit: Payer: Self-pay

## 2020-07-05 MED ORDER — TRAMADOL HCL 50 MG PO TABS: 50.0000 mg | ORAL_TABLET | Freq: Four times a day (QID) | ORAL | 0 refills | Status: DC | PRN

## 2020-07-05 NOTE — Telephone Encounter (Signed)
Last filled 06-07-20 #30 Last OV 06-13-20 Next OV 08-14-20 [place]

## 2020-07-11 ENCOUNTER — Other Ambulatory Visit: Payer: Self-pay

## 2020-07-11 ENCOUNTER — Telehealth: Payer: Self-pay

## 2020-07-11 ENCOUNTER — Emergency Department: Payer: Medicaid Other

## 2020-07-11 ENCOUNTER — Emergency Department
Admission: EM | Admit: 2020-07-11 | Discharge: 2020-07-11 | Disposition: A | Discharge status: Home / Self Care | Payer: Medicaid Other | Attending: Emergency Medicine | Admitting: Emergency Medicine

## 2020-07-11 DIAGNOSIS — I10 Essential (primary) hypertension: Secondary | ICD-10-CM | POA: Diagnosis not present

## 2020-07-11 DIAGNOSIS — R519 Headache, unspecified: Secondary | ICD-10-CM | POA: Diagnosis present

## 2020-07-11 DIAGNOSIS — Z853 Personal history of malignant neoplasm of breast: Secondary | ICD-10-CM | POA: Insufficient documentation

## 2020-07-11 DIAGNOSIS — J069 Acute upper respiratory infection, unspecified: Secondary | ICD-10-CM | POA: Diagnosis not present

## 2020-07-11 DIAGNOSIS — U071 COVID-19: Secondary | ICD-10-CM | POA: Diagnosis not present

## 2020-07-11 DIAGNOSIS — Z79899 Other long term (current) drug therapy: Secondary | ICD-10-CM | POA: Insufficient documentation

## 2020-07-11 DIAGNOSIS — J4530 Mild persistent asthma, uncomplicated: Secondary | ICD-10-CM | POA: Insufficient documentation

## 2020-07-11 DIAGNOSIS — Z1152 Encounter for screening for COVID-19: Secondary | ICD-10-CM

## 2020-07-11 DIAGNOSIS — Z7951 Long term (current) use of inhaled steroids: Secondary | ICD-10-CM | POA: Insufficient documentation

## 2020-07-11 LAB — RESP PANEL BY RT-PCR (FLU A&B, COVID) ARPGX2
Influenza A by PCR: NEGATIVE
Influenza B by PCR: NEGATIVE
SARS Coronavirus 2 by RT PCR: POSITIVE — AB

## 2020-07-11 MED ORDER — ONDANSETRON 4 MG PO TBDP
4.0000 mg | ORAL_TABLET | Freq: Once | ORAL | Status: AC
  Administered 2020-07-11: 4 mg via ORAL
  Filled 2020-07-11: qty 1

## 2020-07-11 MED ORDER — ACETAMINOPHEN 325 MG PO TABS
650.0000 mg | ORAL_TABLET | Freq: Once | ORAL | Status: AC
  Administered 2020-07-11: 650 mg via ORAL
  Filled 2020-07-11: qty 2

## 2020-07-11 NOTE — Telephone Encounter (Signed)
Pt calling [facility]; pt said just left [facility] ED after getting a covid test and pt was advised could leave and go home. Pt said she did not see a doctor and no other testing or med given; just a covid test. Pt said her BP was taken one time 195/120 P 138. pts symptoms began on 07/10/20.  Please see [facility] ED note from 07/11/20. Pt said H/A pain level 7-8. Pt having mid chest tightness that goes into middle of back; the tightness comes and goes but is worse with exertion. Per ED note BP 195/101 P 123 T 100.1 and reck BP 165/110 P 78. Pt has had some diarrhea and last N&V about 30' ago; no blood seen. Pt has dizziness and prod cough with yellow phlegm. No SOB. Pt took losartan 50 mg about 10 AM but does not have way to reck BP at this time. Pt was given tylenol and ondansetron while at ED and pt thought was a nurse that listened to her chest but per ED note was PA. Pt did have CXR also. Pt last seen 06/13/20 for annual exam BP 128/90 P95. Pt said 2 wks ago her coworkers were positive for covid and pt did not wear a mask or social distance with coworkers. Pt said she should have covid results in couple of hours. ED precautions given and pt voiced understanding. Will send note to DR Silvio Pate who is in office. Advised pt to drink fluids or eat ice chips, rest, tylenol for fever and self quarantine. Pt voiced understanding for all instructions. Pt wants to know what Dr Silvio Pate would suggest for pt to do.

## 2020-07-11 NOTE — ED Provider Notes (Signed)
[facility] Emergency Department Provider Note   ____________________________________________   None    (approximate)  I have reviewed the triage vital signs and the nursing notes.   HISTORY  Chief Complaint URI   HPI ADEA GEISEL is a 40 y.o. female Libby Maw to the ED with complaint of low-grade temp, body aches and headache since yesterday.  Patient states that she woke during the night had a temperature of 103 and took something around 4 AM.  Has had some nausea and vomiting but denies any diarrhea.  She is unaware of any change in taste or smell.  Patient has not been vaccinated.  She rates her pain as an 8 out of 10.       Past Medical History:  Diagnosis Date  . Anxiety    Possible bipolar, Type II  . Arthritis   . Asthma   . Bipolar disorder ([place])   . BRCA negative 07/2017   vistaseq neg  . Chronic headaches   . Endometriosis 2018  . Family history of breast cancer   . Hemorrhoids 08/2007  . IBS (irritable bowel syndrome) 08/2007  . 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 Increased risk of breast cancer 07/2017   IBIS=40%  . Kidney infection   . LGSIL on Pap smear of cervix   . Migraines   . Multiple allergies   . Obese   . Psoriasis   . Psoriatic arthritis [facility])     Patient Active Problem List   Diagnosis Date Noted  . Preventative health care 06/13/2020  . Bipolar disease, chronic ([place]) 07/27/2018  . Abnormal MRI, breast 09/30/2017  . Family history of colon cancer 07/27/2017  . 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  . History of cervical dysplasia 07/16/2017  . Family history of breast cancer 07/16/2017  . Secondary oligomenorrhea 07/16/2017  . History of endometriosis 07/16/2017  . Psoriatic arthritis ([place]) 10/28/2016  . Chronic low back pain 10/28/2016  . Essential (primary) hypertension 10/18/2014  . Arthritis 11/24/2013  . Arthropathia 11/24/2013  . Obstipation 04/12/2013  . Migraine with status migrainosus 09/08/2012   . 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 Mild persistent asthma 05/26/2011  . Severe anxiety with panic 08/29/2006  . Panic disorder without agoraphobia 08/29/2006    Past Surgical History:  Procedure Laterality Date  . BREAST BIOPSY Left 8 years  . CESAREAN SECTION    . COLONOSCOPY    . COLONOSCOPY W/ BIOPSIES    . EAR TUBE REMOVAL    . ESOPHAGOGASTRODUODENOSCOPY    . LAPAROSCOPIC CHOLECYSTECTOMY  6/09   Dr. Hulen Skains  . TUBAL LIGATION  2017  . TYMPANOSTOMY TUBE PLACEMENT      Prior to Admission medications   Medication Sig Start Date End Date Taking? Authorizing Provider  albuterol (PROVENTIL HFA;VENTOLIN HFA) 108 (90 Base) MCG/ACT inhaler Inhale 2 puffs into the lungs every 6 (six) hours as needed for wheezing or shortness of breath. 04/16/16   Venia Carbon, MD  ALPRAZolam Duanne Moron) 0.25 MG tablet Take 1 tablet (0.25 mg total) by mouth 2 (two) times daily as needed for anxiety. 06/13/20   Venia Carbon, MD  ARIPiprazole (ABILIFY) 5 MG tablet Take 1 tablet (5 mg total) by mouth at bedtime. 06/13/20   Venia Carbon, MD  cetirizine (ZYRTEC) 10 MG tablet Take 10 mg by mouth daily.    [provider]  CIMZIA PREFILLED 2 X 200 MG/ML KIT inject 2 pens/syringes (one dose) subcutaneously every 4 weeks 03/07/19  [provider]  DULoxetine (CYMBALTA) 60 MG capsule Take 1 capsule (60 mg total) by mouth daily. 06/13/20   Venia Carbon, MD  esomeprazole (NEXIUM) 40 MG capsule Take 1 capsule (40 mg total) by mouth daily as needed. 06/13/20 06/13/21  Venia Carbon, MD  Fluticasone-Salmeterol (ADVAIR) 250-50 MCG/DOSE AEPB Inhale 1 puff into the lungs 2 (two) times daily. 06/13/20   Venia Carbon, MD  losartan (COZAAR) 50 MG tablet Take 1 tablet (50 mg total) by mouth daily. 06/13/20 09/11/20  Venia Carbon, MD  mometasone (ELOCON) 0.1 % ointment APPLY TWICE DAILY AS NEEDED FOR PSORIASIS 10/15/18   Venia Carbon, MD  traMADol  (ULTRAM) 50 MG tablet Take 1 tablet (50 mg total) by mouth every 6 (six) hours as needed for moderate pain or severe pain. 07/05/20 07/05/21  Venia Carbon, MD    Allergies Sulfa antibiotics, Ciprofloxacin, and Effexor [venlafaxine hcl]  Family History  Problem Relation Age of Onset  . Colon polyps Maternal Grandfather   . Parkinson's disease Maternal Grandfather   . Breast cancer Mother 79  . Rheum arthritis Mother   . Irritable bowel syndrome Mother   . Asthma Father   . Anxiety disorder Father        Severe, possible bipolar  . Irritable bowel syndrome Maternal Grandmother   . Diabetes Other        mat. great uncles  . Alzheimer's disease Other        Dad's side  . Colon cancer Other        mat GGF    Social History Social History   Tobacco Use  . Smoking status: Never Smoker  . Smokeless tobacco: Never Used  Vaping Use  . Vaping Use: Never used  Substance Use Topics  . Alcohol use: No    Alcohol/week: 0.0 standard drinks  . Drug use: No    Review of Systems Constitutional: Positive fever/chills Eyes: No visual changes. ENT: No sore throat. Cardiovascular: Denies chest pain. Respiratory: Denies shortness of breath.  Positive for cough. Gastrointestinal: No abdominal pain.  Positive nausea, positive vomiting.  No diarrhea.  Genitourinary: Negative for dysuria. Musculoskeletal: Positive for muscle aches. Skin: Negative for rash. Neurological: Negative for headaches, focal weakness or numbness. ____________________________________________   PHYSICAL EXAM:  VITAL SIGNS: ED Triage Vitals  Enc Vitals Group     BP 07/11/20 0709 (!) 195/101     Pulse Rate 07/11/20 0708 (!) 123     Resp 07/11/20 0708 18     Temp 07/11/20 0708 100.1 F (37.8 C)     Temp Source 07/11/20 0708 Oral     SpO2 07/11/20 0708 97 %     Weight 07/11/20 0708 210 lb (95.3 kg)     Height 07/11/20 0708 '5\' 4"'  (1.626 m)     Head Circumference --      Peak Flow --      Pain Score  07/11/20 0708 8     Pain Loc --      Pain Edu? --      Excl. in [place]? --     Constitutional: Alert and oriented. Well appearing and in no acute distress. Eyes: Conjunctivae are normal.  Head: Atraumatic. Nose: Moderate congestion/rhinnorhea. Neck: No stridor.   Cardiovascular: Normal rate, regular rhythm. Grossly normal heart sounds.  Good peripheral circulation. Respiratory: Normal respiratory effort.  No retractions. Lungs CTAB. Gastrointestinal: Soft and nontender. No distention.  Musculoskeletal: Moves upper and lower extremities without any difficulty and  patient is able to ambulate without assistance. Neurologic:  Normal speech and language. No gross focal neurologic deficits are appreciated. No gait instability. Skin:  Skin is warm, dry and intact. No rash noted. Psychiatric: Mood and affect are normal. Speech and behavior are normal.  ____________________________________________   LABS (all labs ordered are listed, but only abnormal results are displayed)  Labs Reviewed  RESP PANEL BY RT-PCR (FLU A&B, COVID) ARPGX2 - Abnormal; Notable for the following components:      Result Value   SARS Coronavirus 2 by RT PCR POSITIVE (*)    All other components within normal limits     RADIOLOGY I, Johnn Hai, personally viewed and evaluated these images (plain radiographs) as part of my medical decision making, as well as reviewing the written report by the radiologist.   Official radiology report(s): DG Chest 2 View  Result Date: 07/11/2020 CLINICAL DATA:  Body aches with fever and headache EXAM: CHEST - 2 VIEW COMPARISON:  April 03, 2016 FINDINGS: The cardiomediastinal silhouette is normal in contour. No pleural effusion. No pneumothorax. No acute pleuroparenchymal abnormality. Status post cholecystectomy. No acute osseous abnormality noted. IMPRESSION: No acute cardiopulmonary abnormality. Electronically Signed   By: Valentino Saxon MD   On: 07/11/2020 07:54     ____________________________________________   PROCEDURES  Procedure(s) performed (including Critical Care):  Procedures   ____________________________________________   INITIAL IMPRESSION / ASSESSMENT AND PLAN / ED COURSE  As part of my medical decision making, I reviewed the following data within the electronic MEDICAL RECORD NUMBER Notes from prior ED visits and [place] Controlled Substance Database  40 year old female presents to the ED with complaint of body aches, fever, nausea and headache starting yesterday.  Chest x-ray did not show any acute cardiopulmonary changes.  COVID test was positive and patient was called to make sure that she did see her results in my chart.  Patient is aware that she needs to quarantine and also contact anyone that she has been around to let them know that she is positive for COVID.  She is to return to the emergency department if any severe worsening of her symptoms such as shortness of breath or difficulty breathing.  Tylenol or ibuprofen as needed for fever and body aches.  She is to increase fluids to stay hydrated.  ____________________________________________   FINAL CLINICAL IMPRESSION(S) / ED DIAGNOSES  Final diagnoses:  Viral URI with cough  Encounter for screening for COVID-19     ED Discharge Orders    None      *Please note:  HODA HON was evaluated in Emergency Department on 07/11/2020 for the symptoms described in the history of present illness. She was evaluated in the context of the global COVID-19 pandemic, which necessitated consideration that the patient might be at risk for infection with the SARS-CoV-2 virus that causes COVID-19. Institutional protocols and algorithms that pertain to the evaluation of patients at risk for COVID-19 are in a state of rapid change based on information released by regulatory bodies including the CDC and federal and state organizations. These policies and algorithms were followed during the  patient's care in the ED.  Some ED evaluations and interventions may be delayed as a result of limited staffing during and the pandemic.*   Note:  This document was prepared using Dragon voice recognition software and may include unintentional dictation errors.    Johnn Hai, PA-C 07/11/20 1354    Harvest Dark, MD 07/11/20 1458

## 2020-07-11 NOTE — ED Triage Notes (Signed)
Pt c/o body aches with fever and HA since yesterday, states she was running a temp 103 last night and took something around 4am.

## 2020-07-11 NOTE — ED Notes (Signed)
See triage note.Presents with low grade temp and body aches  Sx's started yesterday

## 2020-07-11 NOTE — Discharge Instructions (Signed)
Follow-up with your primary care provider if any continued problems.  Return to the emergency department if any severe worsening of your symptoms such as difficulty breathing or shortness of breath.  Take Tylenol or ibuprofen as needed for body aches, headache or fever.  Drink lots of fluids.  A COVID test was done today.  It takes approximately 6 to 24 hours to receive the results of this testing can be seen on MyChart.

## 2020-07-11 NOTE — Telephone Encounter (Signed)
Please make sure she knows her COVID test was positive--so that might explain some of her elevated BP (as well as the stress of being in the ER). She probably should make a follow up here in about 2 weeks to review the BP (has been elevated at times even in the office)

## 2020-07-11 NOTE — Telephone Encounter (Signed)
Spoke to pt. She will call back to make OV.

## 2020-07-12 ENCOUNTER — Other Ambulatory Visit: Payer: Self-pay | Admitting: Adult Health

## 2020-07-12 ENCOUNTER — Telehealth: Payer: Self-pay

## 2020-07-12 NOTE — Telephone Encounter (Signed)
Called to discuss with patient about COVID-19 symptoms and the use of one of the available treatments for those with mild to moderate Covid symptoms and at a high risk of hospitalization.  Pt appears to qualify for outpatient treatment due to co-morbid conditions and/or a member of an at-risk group in accordance with the FDA Emergency Use Authorization.    Symptom onset: 07/10/20 Vaccinated: No Booster? No Immunocompromised? No Qualifiers: Asthma  Unable to reach pt - Left message and call back number [phone].   Marcello Moores

## 2020-07-12 NOTE — Progress Notes (Signed)
I connected by phone with [name] on 07/12/2020 at 6:15 PM to discuss the potential use of a new treatment for mild to moderate COVID-19 viral infection in non-hospitalized patients.  This patient is a 40 y.o. female that meets the FDA criteria for Emergency Use Authorization of COVID monoclonal antibody sotrovimab.  Has a (+) direct SARS-CoV-2 viral test result  Has mild or moderate COVID-19   Is NOT hospitalized due to COVID-19  Is within 10 days of symptom onset  Has at least one of the high risk factor(s) for progression to severe COVID-19 and/or hospitalization as defined in EUA.  Specific high risk criteria : BMI > 25   I have spoken and communicated the following to the patient or parent/caregiver regarding COVID monoclonal antibody treatment:  1. FDA has authorized the emergency use for the treatment of mild to moderate COVID-19 in adults and pediatric patients with positive results of direct SARS-CoV-2 viral testing who are 37 years of age and older weighing at least 40 kg, and who are at high risk for progressing to severe COVID-19 and/or hospitalization.  2. The significant known and potential risks and benefits of COVID monoclonal antibody, and the extent to which such potential risks and benefits are unknown.  3. Information on available alternative treatments and the risks and benefits of those alternatives, including clinical trials.  4. Patients treated with COVID monoclonal antibody should continue to self-isolate and use infection control measures (e.g., wear mask, isolate, social distance, avoid sharing personal items, clean and disinfect "high touch" surfaces, and frequent handwashing) according to CDC guidelines.   5. The patient or parent/caregiver has the option to accept or refuse COVID monoclonal antibody treatment.  After reviewing this information with the patient, the patient has agreed to receive one of the available covid 19 monoclonal antibodies and  will be provided an appropriate fact sheet prior to infusion.  Sx onset 07/10/20, + test in epic, no vaccine . , RF BMI   [name], NP 07/12/2020 6:15 PM

## 2020-07-13 ENCOUNTER — Ambulatory Visit (HOSPITAL_COMMUNITY)
Admission: RE | Admit: 2020-07-13 | Discharge: 2020-07-13 | Disposition: A | Discharge status: Home / Self Care | Payer: Medicaid Other | Source: Ambulatory Visit | Attending: Pulmonary Disease | Admitting: Pulmonary Disease

## 2020-07-13 DIAGNOSIS — U071 COVID-19: Secondary | ICD-10-CM | POA: Insufficient documentation

## 2020-07-13 MED ORDER — ALBUTEROL SULFATE HFA 108 (90 BASE) MCG/ACT IN AERS: 2.0000 | INHALATION_SPRAY | Freq: Once | RESPIRATORY_TRACT | Status: DC | PRN

## 2020-07-13 MED ORDER — FAMOTIDINE IN NACL 20-0.9 MG/50ML-% IV SOLN: 20.0000 mg | Freq: Once | INTRAVENOUS | Status: DC | PRN

## 2020-07-13 MED ORDER — EPINEPHRINE 0.3 MG/0.3ML IJ SOAJ: 0.3000 mg | Freq: Once | INTRAMUSCULAR | Status: DC | PRN

## 2020-07-13 MED ORDER — DIPHENHYDRAMINE HCL 50 MG/ML IJ SOLN: 50.0000 mg | Freq: Once | INTRAMUSCULAR | Status: DC | PRN

## 2020-07-13 MED ORDER — SOTROVIMAB 500 MG/8ML IV SOLN
500.0000 mg | Freq: Once | INTRAVENOUS | Status: AC
  Administered 2020-07-13: 500 mg via INTRAVENOUS

## 2020-07-13 MED ORDER — METHYLPREDNISOLONE SODIUM SUCC 125 MG IJ SOLR: 125.0000 mg | Freq: Once | INTRAMUSCULAR | Status: DC | PRN

## 2020-07-13 MED ORDER — SODIUM CHLORIDE 0.9 % IV SOLN: INTRAVENOUS | Status: DC | PRN

## 2020-07-13 NOTE — Progress Notes (Signed)
Diagnosis: COVID-19  Physician: Dr. Patrick Wright  Procedure: Covid Infusion Clinic Med: Sotrovimab infusion - Provided patient with sotrovimab fact sheet for patients, parents, and caregivers prior to infusion.   Complications: No immediate complications noted  Discharge: Discharged home    

## 2020-07-13 NOTE — Discharge Instructions (Signed)

## 2020-07-13 NOTE — Progress Notes (Signed)
Patient reviewed Fact Sheet for Patients, Parents, and Caregivers for Emergency Use Authorization (EUA) of sotrovimab for the Treatment of Coronavirus. Patient also reviewed and is agreeable to the estimated cost of treatment. Patient is agreeable to proceed.   

## 2020-07-14 ENCOUNTER — Other Ambulatory Visit: Payer: Self-pay

## 2020-07-14 ENCOUNTER — Encounter: Payer: Self-pay | Admitting: Internal Medicine

## 2020-07-14 ENCOUNTER — Telehealth (INDEPENDENT_AMBULATORY_CARE_PROVIDER_SITE_OTHER): Payer: Medicaid Other | Admitting: Internal Medicine

## 2020-07-14 DIAGNOSIS — U071 COVID-19: Secondary | ICD-10-CM | POA: Insufficient documentation

## 2020-07-14 MED ORDER — HYDROCODONE-HOMATROPINE 5-1.5 MG/5ML PO SYRP: 5.0000 mL | ORAL_SOLUTION | Freq: Every evening | ORAL | 0 refills | Status: DC | PRN

## 2020-07-14 NOTE — Assessment & Plan Note (Signed)
Several days of symptoms Did get infusion CXR was normal Will add cough medicine Continue ibuprofen Recheck if SOB Vaccine in 3 months

## 2020-07-14 NOTE — Progress Notes (Signed)
Subjective:    Patient ID: Bridget Maldonado, female    DOB: [birth year], 40 y.o.   MRN: [id number]  HPI Video virtual visit due to severe cough with active COVID infection  Identification done Reviewed limitations and billing and she gave consent Participants--patient in her home and I am in my office  Symptoms first started 3 days ago Had gone to work the day before that Started high fever in the middle of the night--"my head was splitting" Went to ER--heart rate 138 BP very high also COVID test positive CXR negative Did get infusion yesterday  Still having a severe cough Head still hurting Gets SOB if moving around for a bit Some chest and back pain also  Has tried phenergan DM and coricidin cough med Using ibuprofen-- 829m once a day  Current Outpatient Medications on File Prior to Visit  Medication Sig Dispense Refill  . albuterol (PROVENTIL HFA;VENTOLIN HFA) 108 (90 Base) MCG/ACT inhaler Inhale 2 puffs into the lungs every 6 (six) hours as needed for wheezing or shortness of breath. 1 Inhaler 1  . ALPRAZolam (XANAX) 0.25 MG tablet Take 1 tablet (0.25 mg total) by mouth 2 (two) times daily as needed for anxiety. 60 tablet 0  . ARIPiprazole (ABILIFY) 5 MG tablet Take 1 tablet (5 mg total) by mouth at bedtime. 90 tablet 3  . cetirizine (ZYRTEC) 10 MG tablet Take 10 mg by mouth daily.    .Marland KitchenCIMZIA PREFILLED 2 X 200 MG/ML KIT inject 2 pens/syringes (one dose) subcutaneously every 4 weeks    . DULoxetine (CYMBALTA) 60 MG capsule Take 1 capsule (60 mg total) by mouth daily. 90 capsule 3  . esomeprazole (NEXIUM) 40 MG capsule Take 1 capsule (40 mg total) by mouth daily as needed. 1 capsule 0  . Fluticasone-Salmeterol (ADVAIR) 250-50 MCG/DOSE AEPB Inhale 1 puff into the lungs 2 (two) times daily. 180 each 3  . losartan (COZAAR) 50 MG tablet Take 1 tablet (50 mg total) by mouth daily. 90 tablet 3  . mometasone (ELOCON) 0.1 % ointment APPLY TWICE DAILY AS NEEDED FOR PSORIASIS 45 g 1   . traMADol (ULTRAM) 50 MG tablet Take 1 tablet (50 mg total) by mouth every 6 (six) hours as needed for moderate pain or severe pain. 30 tablet 0   No current facility-administered medications on file prior to visit.    Allergies  Allergen Reactions  . Sulfa Antibiotics Nausea And Vomiting  . Ciprofloxacin Nausea And Vomiting  . Effexor [Venlafaxine Hcl] Other (See Comments)    Reaction:  Made pt sleep for two days straight.    Past Medical History:  Diagnosis Date  . Anxiety    Possible bipolar, Type II  . Arthritis   . Asthma   . Bipolar disorder ([place]   . BRCA negative 07/2017   vistaseq neg  . Chronic headaches   . Endometriosis 2018  . Family history of breast cancer   . Hemorrhoids 08/2007  . IBS (irritable bowel syndrome) 08/2007  . [place], [place] ([place] 08/2007  . Increased risk of breast cancer 07/2017   IBIS=40%  . Kidney infection   . LGSIL on Pap smear of cervix   . Migraines   . Multiple allergies   . Obese   . Psoriasis   . Psoriatic arthritis ([facility]     Past Surgical History:  Procedure Laterality Date  . BREAST BIOPSY Left 8 years  . CESAREAN SECTION    . COLONOSCOPY    . COLONOSCOPY W/  BIOPSIES    . EAR TUBE REMOVAL    . ESOPHAGOGASTRODUODENOSCOPY    . LAPAROSCOPIC CHOLECYSTECTOMY  6/09   Dr. Hulen Skains  . TUBAL LIGATION  2017  . TYMPANOSTOMY TUBE PLACEMENT      Family History  Problem Relation Age of Onset  . Colon polyps Maternal Grandfather   . Parkinson's disease Maternal Grandfather   . Breast cancer Mother 109  . Rheum arthritis Mother   . Irritable bowel syndrome Mother   . Asthma Father   . Anxiety disorder Father        Severe, possible bipolar  . Irritable bowel syndrome Maternal Grandmother   . Diabetes Other        mat. great uncles  . Alzheimer's disease Other        Dad's side  . Colon cancer Other        mat GGF    Social History   Socioeconomic History  . Marital status: Single    Spouse name: Not on file  .  Number of children: 1  . Years of education: Not on file  . Highest education level: Not on file  Occupational History  . Occupation: Office work    Comment: [employer]  Tobacco Use  . Smoking status: Never Smoker  . Smokeless tobacco: Never Used  Vaping Use  . Vaping Use: Never used  Substance and Sexual Activity  . Alcohol use: No    Alcohol/week: 0.0 standard drinks  . Drug use: No  . Sexual activity: Yes    Birth control/protection: Surgical  Other Topics Concern  . Not on file  Social History Narrative  . Not on file   Social Determinants of Health   Financial Resource Strain: Not on file  Food Insecurity: Not on file  Transportation Needs: Not on file  Physical Activity: Not on file  Stress: Not on file  Social Connections: Not on file  Intimate Partner Violence: Not on file   Review of Systems Some nausea ---zofran helping prevent the vomiting Not eating much--but drinking okay Taste is off----seems to be able to smell----but is "super congested" Sleeping much of the time    Objective:   Physical Exam Constitutional:      Comments: Looks ill but no distress  Pulmonary:     Effort: Pulmonary effort is normal. No respiratory distress.            Assessment & Plan:

## 2020-07-14 NOTE — Addendum Note (Signed)
Addended by: Viviana Simpler I on: 07/14/2020 11:31 AM   Modules accepted: Orders

## 2020-07-15 ENCOUNTER — Other Ambulatory Visit: Payer: Self-pay | Admitting: Infectious Diseases

## 2020-07-15 NOTE — Progress Notes (Signed)
Received a call from [place] stating that she received MAB on 1/21. Was having nausea prior to infusion but she has since had vomiting and fatigue since then.   She has tried zofran 4 mg and phenergan at home with dramamine. She is very fatigued and dizzy. Not urinating as much as she normally does.    I will have her try 8 mg zofran to see if that helps allow her to keep fluids down. Sometimes lorazapam helps with nausea however I already see alprazolam on her med sheet. I asked her to consider taking 0.25 mg as well if the higher dose of zofran does not allow for her to hold fluids down.  She sounds at least mildly to moderately dehydrated. I asked her to consider going to the emergency room tonight if these measures don't allow for PO intake to check electrolytes and get IVF. If she can hold fluids down with higher dose of zofran, would also have her incorporate salt into her regimen. May need to hold her antihypertensives at home also for a period of time until she is feeling better.   Will route to her PCP to help with ongoing care and follow up.    Janene Madeira, MSN, NP-C [facility] [email or web address] Pager: [phone] Office: [phone] [place]: [phone]

## 2020-07-16 ENCOUNTER — Telehealth: Payer: Self-pay

## 2020-07-16 NOTE — Progress Notes (Signed)
See other note. Pt went to ER

## 2020-07-16 NOTE — Progress Notes (Signed)
Bridget Maldonado, Please check on her. I would recommend ER evaluation (likely would need IV fluids) if she is still vomiting and can't take fluids

## 2020-07-16 NOTE — Telephone Encounter (Signed)
No need to call now since already went to ER. Please check on her tomorrow

## 2020-07-16 NOTE — Telephone Encounter (Signed)
[place] Night - Client Nonclinical Telephone Record  [facility] Client [place] Night - Client Client Site [place] Physician Viviana Simpler- MD Contact Type Call Who Is Calling Patient / Member / Family / Caregiver Caller Name [place] Phone Number [phone] Patient Name Bridget Maldonado Patient DOB Oct 14, 1980 Call Type Message Only Information Provided Reason for Call Request for General Office Information Initial Comment Caller states was seen today. The medication prescription needs to be sent to another pharmacy as the current one is closed. New temp pharmacy [facility] open until 6PM Ph [phone] Additional Comment Call back request Thank you Disp. Time Disposition Final User 07/14/2020 11:01:25 AM General Information Provided Yes Margaretmary Bayley Call Closed By: Margaretmary Bayley Transaction Date/Time: 07/14/2020 10:57:21 AM (ET)

## 2020-07-16 NOTE — Telephone Encounter (Signed)
Called and spoke with patient regarding medication. Patient stated that she did receive her cough medication from the pharmacy. Patient stated that she is currently in the ED at [facility] getting IV fluids D/T severe dehydration. Patient stated that she is unable to keep anything down. Informed patient to call if she needed anything. Patient verbalized understanding.

## 2020-07-17 NOTE — Telephone Encounter (Signed)
Called pt to see how she was doing. Said she was able to keep drink and crackers down after getting fluids in the ER. Woke up this morning vomiting. She took Zofran. I will call her in a few hours to see if that has helped her.

## 2020-07-17 NOTE — Telephone Encounter (Signed)
Call pt back. She is eating crackers and keeping them down. Just took some dramamine. Was able to void. Motion with walking makes her nauseous. Cough and congestion is getting better.

## 2020-08-09 ENCOUNTER — Other Ambulatory Visit: Payer: Self-pay

## 2020-08-09 MED ORDER — TRAMADOL HCL 50 MG PO TABS: 50.0000 mg | ORAL_TABLET | Freq: Four times a day (QID) | ORAL | 0 refills | Status: DC | PRN

## 2020-08-09 NOTE — Telephone Encounter (Signed)
Last filled 07-05-20 #30 Last OV 07-14-20 Acute Next OV 08-14-20 [place]

## 2020-08-14 ENCOUNTER — Ambulatory Visit: Payer: Medicaid Other | Admitting: Internal Medicine

## 2020-08-17 ENCOUNTER — Other Ambulatory Visit: Payer: Self-pay

## 2020-08-17 MED ORDER — ALPRAZOLAM 0.25 MG PO TABS: 0.2500 mg | ORAL_TABLET | Freq: Two times a day (BID) | ORAL | 0 refills | Status: DC | PRN

## 2020-08-17 NOTE — Telephone Encounter (Signed)
Last filled 06-13-20 #60 Last OV Acute 07-14-20 Next OV 09-04-20 [place]

## 2020-09-04 ENCOUNTER — Ambulatory Visit: Payer: Medicaid Other | Admitting: Internal Medicine

## 2020-09-04 ENCOUNTER — Other Ambulatory Visit: Payer: Self-pay

## 2020-09-04 ENCOUNTER — Encounter: Payer: Self-pay | Admitting: Internal Medicine

## 2020-09-04 DIAGNOSIS — F319 Bipolar disorder, unspecified: Secondary | ICD-10-CM

## 2020-09-04 NOTE — Assessment & Plan Note (Signed)
Does feel her mood is better now Hard to judge with the COVID illness and ongoing fatigue from this Feels more calm and able to deal with things easier Will continue the AM duloxetine and PM aripiprazole

## 2020-09-04 NOTE — Progress Notes (Signed)
Subjective:    Patient ID: Bridget Maldonado, female    DOB: 21-Jul-1980, 40 y.o.   MRN: [id number]  HPI Here for follow up of depression and anxiety This visit occurred during the SARS-CoV-2 public health emergency.  Safety protocols were in place, including screening questions prior to the visit, additional usage of staff PPE, and extensive cleaning of exam room while observing appropriate contact time as indicated for disinfecting solutions.   Has had a very slow recovery from [place] "exhausted" all the time Has occasional days "I am super depressed"---but not often  Anxiety after COVID --has been worse Feels overwhelmed quickly---but this all happened after the COVID  Just had last cimzia Her rheumatologist left--has to start with new one Not working too well for the arthritis though  Went back to work after 10 days of illness  Current Outpatient Medications on File Prior to Visit  Medication Sig Dispense Refill  . albuterol (PROVENTIL HFA;VENTOLIN HFA) 108 (90 Base) MCG/ACT inhaler Inhale 2 puffs into the lungs every 6 (six) hours as needed for wheezing or shortness of breath. 1 Inhaler 1  . ALPRAZolam (XANAX) 0.25 MG tablet Take 1 tablet (0.25 mg total) by mouth 2 (two) times daily as needed for anxiety. 60 tablet 0  . ARIPiprazole (ABILIFY) 5 MG tablet Take 1 tablet (5 mg total) by mouth at bedtime. 90 tablet 3  . cetirizine (ZYRTEC) 10 MG tablet Take 10 mg by mouth daily.    Marland Kitchen CIMZIA PREFILLED 2 X 200 MG/ML KIT inject 2 pens/syringes (one dose) subcutaneously every 4 weeks    . DULoxetine (CYMBALTA) 60 MG capsule Take 1 capsule (60 mg total) by mouth daily. 90 capsule 3  . Fluticasone-Salmeterol (ADVAIR) 250-50 MCG/DOSE AEPB Inhale 1 puff into the lungs 2 (two) times daily. 180 each 3  . losartan (COZAAR) 50 MG tablet Take 1 tablet (50 mg total) by mouth daily. 90 tablet 3  . mometasone (ELOCON) 0.1 % ointment APPLY TWICE DAILY AS NEEDED FOR PSORIASIS 45 g 1  . traMADol  (ULTRAM) 50 MG tablet Take 1 tablet (50 mg total) by mouth every 6 (six) hours as needed for moderate pain or severe pain. 30 tablet 0   No current facility-administered medications on file prior to visit.    Allergies  Allergen Reactions  . Sulfa Antibiotics Nausea And Vomiting  . Ciprofloxacin Nausea And Vomiting  . Effexor [Venlafaxine Hcl] Other (See Comments)    Reaction:  Made pt sleep for two days straight.    Past Medical History:  Diagnosis Date  . Anxiety    Possible bipolar, Type II  . Arthritis   . Asthma   . Bipolar disorder ([place])   . BRCA negative 07/2017   vistaseq neg  . Chronic headaches   . Endometriosis 2018  . Family history of breast cancer   . Hemorrhoids 08/2007  . IBS (irritable bowel syndrome) 08/2007  . 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 Increased risk of breast cancer 07/2017   IBIS=40%  . Kidney infection   . LGSIL on Pap smear of cervix   . Migraines   . Multiple allergies   . Obese   . Psoriasis   . Psoriatic arthritis [facility])     Past Surgical History:  Procedure Laterality Date  . BREAST BIOPSY Left 8 years  . CESAREAN SECTION    . COLONOSCOPY    . COLONOSCOPY W/ BIOPSIES    . EAR TUBE REMOVAL    . ESOPHAGOGASTRODUODENOSCOPY    .  LAPAROSCOPIC CHOLECYSTECTOMY  6/09   Dr. Hulen Skains  . TUBAL LIGATION  2017  . TYMPANOSTOMY TUBE PLACEMENT      Family History  Problem Relation Age of Onset  . Colon polyps Maternal Grandfather   . Parkinson's disease Maternal Grandfather   . Breast cancer Mother 52  . Rheum arthritis Mother   . Irritable bowel syndrome Mother   . Asthma Father   . Anxiety disorder Father        Severe, possible bipolar  . Irritable bowel syndrome Maternal Grandmother   . Diabetes Other        mat. great uncles  . Alzheimer's disease Other        Dad's side  . Colon cancer Other        mat GGF    Social History   Socioeconomic History  . Marital status: Single    Spouse name: Not on file  . Number of  children: 1  . Years of education: Not on file  . Highest education level: Not on file  Occupational History  . Occupation: Office work    Comment: [employer]  Tobacco Use  . Smoking status: Never Smoker  . Smokeless tobacco: Never Used  Vaping Use  . Vaping Use: Never used  Substance and Sexual Activity  . Alcohol use: No    Alcohol/week: 0.0 standard drinks  . Drug use: No  . Sexual activity: Yes    Birth control/protection: Surgical  Other Topics Concern  . Not on file  Social History Narrative  . Not on file   Social Determinants of Health   Financial Resource Strain: Not on file  Food Insecurity: Not on file  Transportation Needs: Not on file  Physical Activity: Not on file  Stress: Not on file  Social Connections: Not on file  Intimate Partner Violence: Not on file   Review of Systems Not sleeping well--initiates well but trouble staying asleep. Goes way back. Relates to joint pains as well Appetite is okay Weight is about the same Has started going to the gym again     Objective:   Physical Exam Constitutional:      Appearance: Normal appearance.  Neurological:     Mental Status: She is alert.  Psychiatric:        Mood and Affect: Mood normal.        Behavior: Behavior normal.            Assessment & Plan:

## 2020-09-05 ENCOUNTER — Other Ambulatory Visit: Payer: Self-pay | Admitting: Internal Medicine

## 2020-09-05 DIAGNOSIS — Z1231 Encounter for screening mammogram for malignant neoplasm of breast: Secondary | ICD-10-CM

## 2020-09-06 ENCOUNTER — Other Ambulatory Visit: Payer: Self-pay

## 2020-09-06 MED ORDER — TRAMADOL HCL 50 MG PO TABS: 50.0000 mg | ORAL_TABLET | Freq: Four times a day (QID) | ORAL | 0 refills | Status: DC | PRN

## 2020-09-06 NOTE — Telephone Encounter (Signed)
Last filled 08-09-20 #30 Last OV  08-14-20 Next OV 07-09-21 [place]

## 2020-10-01 ENCOUNTER — Other Ambulatory Visit: Payer: Self-pay

## 2020-10-01 MED ORDER — ALPRAZOLAM 0.25 MG PO TABS: 0.2500 mg | ORAL_TABLET | Freq: Two times a day (BID) | ORAL | 0 refills | Status: DC | PRN

## 2020-10-01 NOTE — Telephone Encounter (Signed)
Last filled 08-17-20 #60 Last OV 09-04-20 Next OV 07-09-21 [place]

## 2020-10-09 ENCOUNTER — Other Ambulatory Visit: Payer: Self-pay

## 2020-10-09 ENCOUNTER — Ambulatory Visit (INDEPENDENT_AMBULATORY_CARE_PROVIDER_SITE_OTHER): Payer: Medicaid Other

## 2020-10-09 ENCOUNTER — Ambulatory Visit
Admission: EM | Admit: 2020-10-09 | Discharge: 2020-10-09 | Disposition: A | Discharge status: Other Acute Inpatient Hospital | Payer: Medicaid Other | Attending: Emergency Medicine | Admitting: Emergency Medicine

## 2020-10-09 DIAGNOSIS — R109 Unspecified abdominal pain: Secondary | ICD-10-CM

## 2020-10-09 DIAGNOSIS — R1110 Vomiting, unspecified: Secondary | ICD-10-CM | POA: Diagnosis not present

## 2020-10-09 DIAGNOSIS — K5900 Constipation, unspecified: Secondary | ICD-10-CM

## 2020-10-09 DIAGNOSIS — R140 Abdominal distension (gaseous): Secondary | ICD-10-CM | POA: Diagnosis not present

## 2020-10-09 NOTE — ED Notes (Signed)
Patient is being discharged from the Urgent Care and sent to the Emergency Department via POV . Per Margarette Canada NP, patient is in need of higher level of care due to current symptoms. Patient is aware and verbalizes understanding of plan of care.  Vitals:   10/09/20 1117  BP: (!) 152/123  Pulse: 85  Resp: 16  Temp: 98.7 F (37.1 C)  SpO2: 100%

## 2020-10-09 NOTE — Discharge Instructions (Addendum)
As we discussed, there is concern that you have a small bowel obstruction based on your swollen abdomen, the lack of bowel sounds, and the lack of stool present on your x-rays of your abdomen.  Recommending that she go to the emergency department at [facility] for evaluation and a CAT scan to rule out small bowel obstruction.

## 2020-10-09 NOTE — ED Triage Notes (Addendum)
Patient presents to Urgent Care with complaints of constipation (last BM x 1 week), emesis (yesterday), fever, headache, chest tightness, jaw pain, abdominal pain, and tachycardia since Sunday She states she has a hx of IBS and tested positive for COVID in Feb. She has taken a stool softener to help relieve constipation with no relief.   Denies diarrhea.

## 2020-10-09 NOTE — ED Provider Notes (Signed)
[facility]    CSN: [id number] Arrival date & time: 10/09/20  1050      History   Chief Complaint Chief Complaint  Patient presents with  . Emesis  . Fever  . Tachycardia    HPI Bridget Maldonado is a 40 y.o. female.   HPI   40 year old female here for evaluation of abdominal complaints.  Patient reports that she has been experiencing constipation with no BM for the past week, abdominal pain with continued abdominal distention.  She reports that she is larger than she was when she was pregnant.  She is also reporting a fever with a T-max of 101, loss of appetite, nausea and vomiting for 1 bout of emesis last night, shortness of breath, chest tightness because she feels pressure from her abdomen up into her chest cavity, and right upper quadrant pain that will shoot across her abdomen to the other side and through to her back.  Past Medical History:  Diagnosis Date  . Anxiety    Possible bipolar, Type II  . Arthritis   . Asthma   . Bipolar disorder ([place])   . BRCA negative 07/2017   vistaseq neg  . Chronic headaches   . Endometriosis 2018  . Family history of breast cancer   . Hemorrhoids 08/2007  . IBS (irritable bowel syndrome) 08/2007  . 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 Increased risk of breast cancer 07/2017   IBIS=40%  . Kidney infection   . LGSIL on Pap smear of cervix   . Migraines   . Multiple allergies   . Obese   . Psoriasis   . Psoriatic arthritis [facility])     Patient Active Problem List   Diagnosis Date Noted  . COVID-19 virus infection 07/14/2020  . Preventative health care 06/13/2020  . Bipolar disease, chronic ([place]) 07/27/2018  . Abnormal MRI, breast 09/30/2017  . Family history of colon cancer 07/27/2017  . 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  . History of cervical dysplasia 07/16/2017  . Family history of breast cancer 07/16/2017  . Secondary oligomenorrhea 07/16/2017  . History of endometriosis 07/16/2017  . Psoriatic arthritis  ([place]) 10/28/2016  . Chronic low back pain 10/28/2016  . Essential (primary) hypertension 10/18/2014  . Arthritis 11/24/2013  . Arthropathia 11/24/2013  . Obstipation 04/12/2013  . Migraine with status migrainosus 09/08/2012  . 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 Mild persistent asthma 05/26/2011  . Severe anxiety with panic 08/29/2006  . Panic disorder without agoraphobia 08/29/2006    Past Surgical History:  Procedure Laterality Date  . BREAST BIOPSY Left 8 years  . CESAREAN SECTION    . COLONOSCOPY    . COLONOSCOPY W/ BIOPSIES    . EAR TUBE REMOVAL    . ESOPHAGOGASTRODUODENOSCOPY    . LAPAROSCOPIC CHOLECYSTECTOMY  6/09   Dr. Hulen Skains  . TUBAL LIGATION  2017  . TYMPANOSTOMY TUBE PLACEMENT      OB History    Gravida  2   Para  1   Term  1   Preterm      AB  1   Living  1     SAB      IAB  1   Ectopic      Multiple      Live Births           Obstetric Comments  Menstrual age: 19  Age 1st Pregnancy: 33          Home Medications  Prior to Admission medications   Medication Sig Start Date End Date Taking? Authorizing Provider  albuterol (PROVENTIL HFA;VENTOLIN HFA) 108 (90 Base) MCG/ACT inhaler Inhale 2 puffs into the lungs every 6 (six) hours as needed for wheezing or shortness of breath. 04/16/16   Venia Carbon, MD  ALPRAZolam Duanne Moron) 0.25 MG tablet Take 1 tablet (0.25 mg total) by mouth 2 (two) times daily as needed for anxiety. 10/01/20   Venia Carbon, MD  ARIPiprazole (ABILIFY) 5 MG tablet Take 1 tablet (5 mg total) by mouth at bedtime. 06/13/20   Venia Carbon, MD  cetirizine (ZYRTEC) 10 MG tablet Take 10 mg by mouth daily.    [provider]  CIMZIA PREFILLED 2 X 200 MG/ML KIT inject 2 pens/syringes (one dose) subcutaneously every 4 weeks 03/07/19   [provider]  DULoxetine (CYMBALTA) 60 MG capsule Take 1 capsule (60 mg total) by mouth daily. 06/13/20   Venia Carbon, MD   Fluticasone-Salmeterol (ADVAIR) 250-50 MCG/DOSE AEPB Inhale 1 puff into the lungs 2 (two) times daily. 06/13/20   Venia Carbon, MD  losartan (COZAAR) 50 MG tablet Take 1 tablet (50 mg total) by mouth daily. 06/13/20 09/11/20  Venia Carbon, MD  mometasone (ELOCON) 0.1 % ointment APPLY TWICE DAILY AS NEEDED FOR PSORIASIS 10/15/18   Venia Carbon, MD  traMADol (ULTRAM) 50 MG tablet Take 1 tablet (50 mg total) by mouth every 6 (six) hours as needed for moderate pain or severe pain. 09/06/20 09/06/21  Venia Carbon, MD    Family History Family History  Problem Relation Age of Onset  . Colon polyps Maternal Grandfather   . Parkinson's disease Maternal Grandfather   . Breast cancer Mother 19  . Rheum arthritis Mother   . Irritable bowel syndrome Mother   . Asthma Father   . Anxiety disorder Father        Severe, possible bipolar  . Irritable bowel syndrome Maternal Grandmother   . Diabetes Other        mat. great uncles  . Alzheimer's disease Other        Dad's side  . Colon cancer Other        mat GGF    Social History Social History   Tobacco Use  . Smoking status: Never Smoker  . Smokeless tobacco: Never Used  Vaping Use  . Vaping Use: Never used  Substance Use Topics  . Alcohol use: No    Alcohol/week: 0.0 standard drinks  . Drug use: No     Allergies   Sulfa antibiotics, Ciprofloxacin, and Effexor [venlafaxine hcl]   Review of Systems Review of Systems  Constitutional: Positive for appetite change and fever. Negative for activity change.  Gastrointestinal: Positive for abdominal distention, abdominal pain, constipation, nausea and vomiting.  Genitourinary: Negative for dysuria, frequency and urgency.  Musculoskeletal: Positive for back pain.  Skin: Negative for rash.  Neurological: Positive for headaches.  Hematological: Negative.   Psychiatric/Behavioral: Negative.      Physical Exam Triage Vital Signs ED Triage Vitals  Enc Vitals Group      BP 10/09/20 1117 (!) 152/123     Pulse Rate 10/09/20 1117 85     Resp 10/09/20 1117 16     Temp 10/09/20 1117 98.7 F (37.1 C)     Temp Source 10/09/20 1117 Oral     SpO2 10/09/20 1117 100 %     Weight 10/09/20 1115 210 lb (95.3 kg)     Height --  Head Circumference --      Peak Flow --      Pain Score 10/09/20 1115 7     Pain Loc --      Pain Edu? --      Excl. in [place]? --    No data found.  Updated Vital Signs BP (!) 152/123 (BP Location: Left Arm)   Pulse 85   Temp 98.7 F (37.1 C) (Oral)   Resp 16   Wt 210 lb (95.3 kg)   LMP 11/16/2018 Comment: signed preg waiver  SpO2 100%   BMI 34.95 kg/m   Visual Acuity Right Eye Distance:   Left Eye Distance:   Bilateral Distance:    Right Eye Near:   Left Eye Near:    Bilateral Near:     Physical Exam Vitals and nursing note reviewed.  Constitutional:      General: She is in acute distress.     Appearance: Normal appearance.  HENT:     Head: Normocephalic and atraumatic.  Cardiovascular:     Rate and Rhythm: Normal rate and regular rhythm.     Pulses: Normal pulses.     Heart sounds: Normal heart sounds. No murmur heard. No gallop.   Pulmonary:     Effort: Pulmonary effort is normal.     Breath sounds: Normal breath sounds. No wheezing, rhonchi or rales.  Abdominal:     General: There is distension.     Tenderness: There is abdominal tenderness. There is no guarding or rebound.  Skin:    General: Skin is warm and dry.     Capillary Refill: Capillary refill takes less than 2 seconds.     Findings: No erythema or rash.  Neurological:     General: No focal deficit present.     Mental Status: She is alert and oriented to person, place, and time.  Psychiatric:        Mood and Affect: Mood normal.        Behavior: Behavior normal.        Thought Content: Thought content normal.        Judgment: Judgment normal.      UC Treatments / Results  Labs (all labs ordered are listed, but only abnormal results are  displayed) Labs Reviewed - No data to display  EKG   Radiology DG Abdomen Acute W/Chest  Result Date: 10/09/2020 CLINICAL DATA:  Abdominal pain, constipation, and vomiting. EXAM: DG ABDOMEN ACUTE WITH 1 VIEW CHEST COMPARISON:  Chest x-ray dated July 11, 2020. CTA chest, abdomen, and pelvis dated March 24, 2019. FINDINGS: There is no evidence of dilated bowel loops or free intraperitoneal air. No radiopaque calculi or other significant radiographic abnormality is seen. Prior cholecystectomy and tubal ligation. Heart size and mediastinal contours are within normal limits. Both lungs are clear. IMPRESSION: Negative abdominal radiographs.  No acute cardiopulmonary disease. Electronically Signed   By: Titus Dubin M.D.   On: 10/09/2020 12:13    Procedures Procedures (including critical care time)  Medications Ordered in UC Medications - No data to display  Initial Impression / Assessment and Plan / UC Course  I have reviewed the triage vital signs and the nursing notes.  Pertinent labs & imaging results that were available during my care of the patient were reviewed by me and considered in my medical decision making (see chart for details).   Patient is a very pleasant 40 year old female who looks to be in a great deal of discomfort who presents for  evaluation of abdominal pain and distention that is been associated with a weeks worth of constipation.  She is also had a loss of appetite, a fever with a T-max of 101 last night, shortness of breath, chest tightness, jaw pain, nausea with 1 bout of emesis, and right upper quadrant pain.  Patient states that she is only in passing mucus through her rectum but has not passed any stool despite taking laxatives.  Patient ports her chest tightness feels like pressure from her abdomen up into her chest but not chest pain.  Patient's cardiopulmonary exam is benign.  Patient's abdomen is markedly distended and taut with absent bowel sounds.  Patient is  generalized tenderness to her entire abdomen.  Suspect patient might have a possible bowel obstruction and will obtain three-way abdomen.  EKG obtained at triage for chest tightness which shows normal sinus rhythm.  EKG normal sinus rhythm with a ventricular rate of 78, PR interval of 156 ms, QRS duration 72 ms, QT 370 ms, QTC 430 ms.  Normal axis with no ST elevation or depression.  Radiology interpretation of three-way abdomen is that is negative radiograph.  Negative for dilated bowel loops, retained stool, or renal calculi.  Given patient's gross abdominal distention, the lack of stool in her colon on the radiograph, and the lack of bowel sounds I am still very concerned the patient has a bowel obstruction.  We are unable to facilitate of a CAT scan of her abdomen here in the urgent care today and I am recommending patient go to the emergency room for evaluation.  Patient has elected to go to [facility] via POV.  Report called to Raquel Sarna, the triage nurse at [facility].   Final Clinical Impressions(s) / UC Diagnoses   Final diagnoses:  Abdominal distention     Discharge Instructions     As we discussed, there is concern that you have a small bowel obstruction based on your swollen abdomen, the lack of bowel sounds, and the lack of stool present on your x-rays of your abdomen.  Commending that she go to the emergency department at [facility] for evaluation and a CAT scan to rule out small bowel obstruction.    ED Prescriptions    None     PDMP not reviewed this encounter.   Margarette Canada, NP 10/09/20 1236

## 2020-10-19 ENCOUNTER — Other Ambulatory Visit: Payer: Self-pay

## 2020-10-19 MED ORDER — TRAMADOL HCL 50 MG PO TABS: 50.0000 mg | ORAL_TABLET | Freq: Four times a day (QID) | ORAL | 0 refills | Status: DC | PRN

## 2020-10-19 NOTE — Telephone Encounter (Signed)
Last filled 09-06-20#30 Last OV  09-04-20 Next OV 07-09-21 [place]

## 2020-10-29 ENCOUNTER — Other Ambulatory Visit: Payer: Self-pay

## 2020-10-29 ENCOUNTER — Ambulatory Visit
Admission: RE | Admit: 2020-10-29 | Discharge: 2020-10-29 | Disposition: A | Discharge status: Home / Self Care | Payer: Medicaid Other | Source: Ambulatory Visit | Attending: Internal Medicine | Admitting: Internal Medicine

## 2020-10-29 DIAGNOSIS — Z1231 Encounter for screening mammogram for malignant neoplasm of breast: Secondary | ICD-10-CM

## 2020-11-16 ENCOUNTER — Other Ambulatory Visit: Payer: Self-pay

## 2020-11-16 MED ORDER — TRAMADOL HCL 50 MG PO TABS: 50.0000 mg | ORAL_TABLET | Freq: Four times a day (QID) | ORAL | 0 refills | Status: DC | PRN

## 2020-11-16 NOTE — Telephone Encounter (Signed)
Last filled4-29-22#30 Last OV3-15-22 Next OV1-17-23 [facility]

## 2020-12-05 MED ORDER — HYDROCODONE BIT-HOMATROP MBR 5-1.5 MG/5ML PO SOLN: 5.0000 mL | Freq: Every evening | ORAL | 0 refills | Status: DC | PRN

## 2020-12-10 ENCOUNTER — Encounter: Payer: Self-pay | Admitting: Emergency Medicine

## 2020-12-10 ENCOUNTER — Other Ambulatory Visit: Payer: Self-pay

## 2020-12-10 ENCOUNTER — Ambulatory Visit
Admission: EM | Admit: 2020-12-10 | Discharge: 2020-12-10 | Disposition: A | Discharge status: Home / Self Care | Payer: Medicaid Other

## 2020-12-10 ENCOUNTER — Ambulatory Visit (INDEPENDENT_AMBULATORY_CARE_PROVIDER_SITE_OTHER): Payer: Medicaid Other

## 2020-12-10 DIAGNOSIS — J4541 Moderate persistent asthma with (acute) exacerbation: Secondary | ICD-10-CM | POA: Diagnosis not present

## 2020-12-10 DIAGNOSIS — R059 Cough, unspecified: Secondary | ICD-10-CM | POA: Diagnosis not present

## 2020-12-10 DIAGNOSIS — R062 Wheezing: Secondary | ICD-10-CM | POA: Diagnosis not present

## 2020-12-10 MED ORDER — DOXYCYCLINE HYCLATE 100 MG PO CAPS: 100.0000 mg | ORAL_CAPSULE | Freq: Two times a day (BID) | ORAL | 0 refills | Status: DC

## 2020-12-10 MED ORDER — METHYLPREDNISOLONE 4 MG PO TBPK: ORAL_TABLET | ORAL | 0 refills | Status: DC

## 2020-12-10 MED ORDER — HYDROCODONE BIT-HOMATROP MBR 5-1.5 MG/5ML PO SOLN: 5.0000 mL | Freq: Every evening | ORAL | 0 refills | Status: DC | PRN

## 2020-12-10 NOTE — ED Provider Notes (Signed)
[facility]    CSN: [id number] Arrival date & time: 12/10/20  1551      History   Chief Complaint Chief Complaint  Patient presents with   Wheezing   Cough    HPI Bridget Maldonado is a 40 y.o. female who presents with unresolved cough x 2 weeks. Has been on Augmentin, Prednisone 20 mg bid  and Cefdinier and has not helped. She has been using her inhalers. She does not have a neb machine. She does not smoke. Her school aged daughter started the URI with cough, and she still has the cough, but is not bothersome to her.     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   Increased risk of breast cancer 07/2017   IBIS=40%   Kidney infection    LGSIL on Pap smear of cervix    Migraines    Multiple allergies    Obese    Psoriasis    Psoriatic arthritis ([place])     Patient Active Problem List   Diagnosis Date Noted   COVID-19 virus infection 07/14/2020   Preventative health care 06/13/2020   Bipolar disease, chronic ([place]) 07/27/2018   Abnormal MRI, breast 09/30/2017   Family history of colon cancer 07/27/2017   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   History of cervical dysplasia 07/16/2017   Family history of breast cancer 07/16/2017   Secondary oligomenorrhea 07/16/2017   History of endometriosis 07/16/2017   Psoriatic arthritis ([place]) 10/28/2016   Chronic low back pain 10/28/2016   Essential (primary) hypertension 10/18/2014   Arthritis 11/24/2013   Arthropathia 11/24/2013   Obstipation 04/12/2013   Migraine with status migrainosus 09/08/2012   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 Hulen Skains   TUBAL LIGATION  2017   TYMPANOSTOMY TUBE PLACEMENT      OB History     Gravida  2   Para  1   Term  1   Preterm      AB  1   Living  1      SAB      IAB  1   Ectopic      Multiple      Live Births           Obstetric Comments  Menstrual age: 3  Age 1st Pregnancy: 45           Home Medications    Prior to Admission medications   Medication Sig Start Date End Date Taking? Authorizing Provider  albuterol (PROVENTIL HFA;VENTOLIN HFA) 108 (90 Base) MCG/ACT inhaler Inhale 2 puffs into the lungs every 6 (six) hours as needed for wheezing or shortness of breath. 04/16/16  Yes [place],  Theophilus Kinds, MD  ALPRAZolam Duanne Moron) 0.25 MG tablet Take 1 tablet (0.25 mg total) by mouth 2 (two) times daily as needed for anxiety. 10/01/20  Yes Venia Carbon, MD  ARIPiprazole (ABILIFY) 5 MG tablet Take 1 tablet (5 mg total) by mouth at bedtime. 06/13/20  Yes Venia Carbon, MD  cetirizine (ZYRTEC) 10 MG tablet Take 10 mg by mouth daily.   Yes [provider]  doxycycline (VIBRAMYCIN) 100 MG capsule Take 1 capsule (100 mg total) by mouth 2 (two) times daily. 12/10/20  Yes Rodriguez-Southworth, Sunday Spillers, PA-C  DULoxetine (CYMBALTA) 60 MG capsule Take 1 capsule (60 mg total) by mouth daily. 06/13/20  Yes Venia Carbon, MD  Fluticasone-Salmeterol (ADVAIR) 250-50 MCG/DOSE AEPB Inhale 1 puff into the lungs 2 (two) times daily. 06/13/20  Yes Venia Carbon, MD  losartan (COZAAR) 50 MG tablet Take 1 tablet (50 mg total) by mouth daily. 06/13/20 12/10/20 Yes Venia Carbon, MD  methylPREDNISolone (MEDROL DOSEPAK) 4 MG TBPK tablet Take as directed  12/10/20  Yes Rodriguez-Southworth, Sunday Spillers, PA-C  traMADol (ULTRAM) 50 MG tablet Take 1 tablet (50 mg total) by mouth every 6 (six) hours as needed for moderate pain or severe pain. 11/16/20 11/16/21 Yes Viviana Simpler I, MD  cefdinir (OMNICEF) 300 MG capsule Take by mouth. 12/05/20   [provider]  CIMZIA PREFILLED 2 X 200 MG/ML KIT inject 2 pens/syringes (one dose) subcutaneously every 4 weeks 03/07/19   [provider]  HYDROcodone bit-homatropine (HYCODAN) 5-1.5 MG/5ML syrup Take 5 mLs by mouth at bedtime as needed for cough. 12/10/20   Rodriguez-Southworth, Sunday Spillers, PA-C  mometasone (ELOCON) 0.1 % ointment APPLY TWICE DAILY AS NEEDED FOR PSORIASIS 10/15/18   Venia Carbon, MD    Family History Family History  Problem Relation Age of Onset   Colon polyps Maternal Grandfather    Parkinson's disease Maternal Grandfather    Breast cancer Mother 78   Rheum arthritis Mother    Irritable bowel syndrome Mother    Asthma Father    Anxiety disorder Father        Severe, possible bipolar   Irritable bowel syndrome Maternal Grandmother    Diabetes Other        mat. great uncles   Alzheimer's disease Other        Dad's side   Colon cancer Other        mat GGF    Social History Social History   Tobacco Use   Smoking status: Never   Smokeless tobacco: Never  Vaping Use   Vaping Use: Never used  Substance Use Topics   Alcohol use: No    Alcohol/week: 0.0 standard drinks   Drug use: No     Allergies   Sulfa antibiotics, Ciprofloxacin, and Effexor [venlafaxine hcl]   Review of Systems Review of Systems + cough and wheezing. The rest is neg.   Physical Exam Triage Vital Signs ED Triage Vitals  Enc Vitals Group     BP 12/10/20 1607 (!) 140/99     Pulse Rate 12/10/20 1607 (!) 104     Resp 12/10/20 1607 18     Temp 12/10/20 1607 98.9 F (37.2 C)     Temp Source 12/10/20 1607 Oral     SpO2 12/10/20 1607 97 %     Weight 12/10/20 1605 210 lb 1.6 oz (95.3 kg)      Height 12/10/20 1605 _0  (1.651 m)     Head Circumference --  Peak Flow --      Pain Score 12/10/20 1604 5     Pain Loc --      Pain Edu? --      Excl. in [place]? --    No data found.  Updated Vital Signs BP (!) 140/99 (BP Location: Left Arm)   Pulse (!) 104   Temp 98.9 F (37.2 C) (Oral)   Resp 18   Ht _0  (1.651 m)   Wt 210 lb 1.6 oz (95.3 kg)   LMP 11/16/2018 Comment: signed preg waiver  SpO2 97%   BMI 34.96 kg/m   Visual Acuity Right Eye Distance:   Left Eye Distance:   Bilateral Distance:    Right Eye Near:   Left Eye Near:    Bilateral Near:     Physical Exam Physical Exam Constitutional:      General: He is not in acute distress.    Appearance: He is not toxic-appearing.  HENT:     Head: Normocephalic.     Right Ear: Tympanic membrane, ear canal and external ear normal.     Left Ear: Ear canal and external ear normal.     Nose: Nose normal.     Mouth/Throat:     Mouth: Mucous membranes are moist.     Pharynx: Oropharynx is clear.  Eyes:     General: No scleral icterus.    Conjunctiva/sclera: Conjunctivae normal.  Cardiovascular:     Rate and Rhythm: Normal rate and regular rhythm.     Heart sounds: No murmur heard.   Pulmonary:     Effort: Pulmonary effort is normal. No respiratory distress.     Breath sounds: Wheezing present.     Comments: Has auditory wheezing Musculoskeletal:        General: Normal range of motion.     Cervical back: Neck supple.  Lymphadenopathy:     Cervical: No cervical adenopathy.  Skin:    General: Skin is warm and dry.     Findings: No rash.  Neurological:     Mental Status: He is alert and oriented to person, place, and time.     Gait: Gait normal.  Psychiatric:        Mood and Affect: Mood normal.        Behavior: Behavior normal.        Thought Content: Thought content normal.        Judgment: Judgment normal.    UC Treatments / Results  Labs (all labs ordered are listed, but only abnormal  results are displayed) Labs Reviewed - No data to display  EKG   Radiology DG Chest 2 View  Result Date: 12/10/2020 CLINICAL DATA:  Cough and wheezing. EXAM: CHEST - 2 VIEW COMPARISON:  07/11/2020 FINDINGS: The cardiomediastinal contours are normal. The lungs are clear. Pulmonary vasculature is normal. No consolidation, pleural effusion, or pneumothorax. No acute osseous abnormalities are seen. IMPRESSION: No acute findings or explanation for cough. Electronically Signed   By: Keith Rake M.D.   On: 12/10/2020 16:43    Procedures Procedures (including critical care time)  Medications Ordered in UC Medications - No data to display  Initial Impression / Assessment and Plan / UC Course  I have reviewed the triage vital signs and the nursing notes. Pertinent imaging results that were available during my care of the patient were reviewed by me and considered in my medical decision making (see chart for details). I placed her on Medrol dose pack, I taught her how  to do chest percussion so her mom can do those on her, and switched her antibiotic to Doxy as noted. Needs to FU with her PCP this week since she does not see her pulmonologist anymore.    Final Clinical Impressions(s) / UC Diagnoses   Final diagnoses:  Moderate persistent asthma with exacerbation     Discharge Instructions      Use your inhalers daily around the clock Have someone do chest percussion 3 times a day for 5 days. Increase your fluids.  Have a Follow with your family Doctor this week.      ED Prescriptions     Medication Sig Dispense Auth. Provider   methylPREDNISolone (MEDROL DOSEPAK) 4 MG TBPK tablet Take as directed 21 tablet Rodriguez-Southworth, Sunday Spillers, PA-C   doxycycline (VIBRAMYCIN) 100 MG capsule Take 1 capsule (100 mg total) by mouth 2 (two) times daily. 14 capsule Rodriguez-Southworth, Teigan Sahli, PA-C   HYDROcodone bit-homatropine (HYCODAN) 5-1.5 MG/5ML syrup Take 5 mLs by mouth at bedtime as  needed for cough. 120 mL Rodriguez-Southworth, Sunday Spillers, PA-C      I have reviewed the PDMP during this encounter.   Shelby Mattocks, PA-C 12/10/20 1709

## 2020-12-10 NOTE — Discharge Instructions (Addendum)
Use your inhalers daily around the clock Have someone do chest percussion 3 times a day for 5 days. Increase your fluids.  Have a Follow with your family Doctor this week.

## 2020-12-10 NOTE — ED Triage Notes (Signed)
Pt c/o cough, wheezing. She states she started getting sick about 2 weeks ago. She was seen and covid tested when it first started and was negative. She was given 2 different antibiotics and prednisone and she still is not better.

## 2020-12-14 ENCOUNTER — Other Ambulatory Visit: Payer: Self-pay

## 2020-12-14 MED ORDER — TRAMADOL HCL 50 MG PO TABS: 50.0000 mg | ORAL_TABLET | Freq: Four times a day (QID) | ORAL | 0 refills | Status: DC | PRN

## 2020-12-14 NOTE — Telephone Encounter (Signed)
Last filled 11-16-20 #30 Last OV 09-04-20 Next OV 07-09-21 [place]

## 2020-12-26 ENCOUNTER — Other Ambulatory Visit: Payer: Self-pay

## 2020-12-26 NOTE — Telephone Encounter (Signed)
Last filled 10-01-20 #60 Last OV 09-04-20 Next OV 07-09-21 Bridget Maldonado

## 2020-12-27 MED ORDER — ALPRAZOLAM 0.25 MG PO TABS: 0.2500 mg | ORAL_TABLET | Freq: Two times a day (BID) | ORAL | 0 refills | Status: DC | PRN

## 2021-01-11 ENCOUNTER — Other Ambulatory Visit: Payer: Self-pay

## 2021-01-11 MED ORDER — TRAMADOL HCL 50 MG PO TABS: 50.0000 mg | ORAL_TABLET | Freq: Four times a day (QID) | ORAL | 0 refills | Status: DC | PRN

## 2021-01-11 NOTE — Telephone Encounter (Signed)
Last filled 12-14-20 #30 Last OV 09-04-20 Next OV 07-09-21 Bridget Maldonado

## 2021-02-08 MED ORDER — TRAMADOL HCL 50 MG PO TABS: 50.0000 mg | ORAL_TABLET | Freq: Four times a day (QID) | ORAL | 0 refills | Status: DC | PRN

## 2021-02-08 NOTE — Telephone Encounter (Signed)
Last filled 01-11-21 #30 Last OV 09-04-20 Next OV 07-09-21 [place]

## 2021-02-21 ENCOUNTER — Other Ambulatory Visit: Payer: Self-pay

## 2021-02-22 NOTE — Telephone Encounter (Signed)
Last OV- 09/04/2020 Next OV- N/a Last Filled - 12/27/2020

## 2021-02-24 MED ORDER — ALPRAZOLAM 0.25 MG PO TABS: 0.2500 mg | ORAL_TABLET | Freq: Two times a day (BID) | ORAL | 0 refills | Status: DC | PRN

## 2021-03-19 ENCOUNTER — Other Ambulatory Visit: Payer: Self-pay

## 2021-03-19 MED ORDER — TRAMADOL HCL 50 MG PO TABS: 50.0000 mg | ORAL_TABLET | Freq: Four times a day (QID) | ORAL | 0 refills | Status: DC | PRN

## 2021-03-19 NOTE — Telephone Encounter (Signed)
Last filled 02-08-21 #30 Last OV 09-04-20 Next OV 07-09-21 Bridget Maldonado

## 2021-04-12 ENCOUNTER — Other Ambulatory Visit: Payer: Self-pay

## 2021-04-12 MED ORDER — TRAMADOL HCL 50 MG PO TABS: 50.0000 mg | ORAL_TABLET | Freq: Four times a day (QID) | ORAL | 0 refills | Status: DC | PRN

## 2021-04-12 NOTE — Telephone Encounter (Signed)
Last filled 03-19-21 #30 Last OV 09-04-20 Next OV 07-09-21 [place]

## 2021-04-30 ENCOUNTER — Other Ambulatory Visit: Payer: Self-pay

## 2021-04-30 MED ORDER — ALPRAZOLAM 0.25 MG PO TABS: 0.2500 mg | ORAL_TABLET | Freq: Two times a day (BID) | ORAL | 0 refills | Status: DC | PRN

## 2021-04-30 NOTE — Telephone Encounter (Signed)
Alprazolam #60 Last filled 02-24-21 Last OV 09-04-20 Next OV 05-09-21 [place]

## 2021-05-09 ENCOUNTER — Other Ambulatory Visit: Payer: Self-pay

## 2021-05-09 ENCOUNTER — Encounter: Payer: Self-pay | Admitting: Internal Medicine

## 2021-05-09 ENCOUNTER — Ambulatory Visit: Payer: Medicaid Other | Admitting: Internal Medicine

## 2021-05-09 VITALS — BP 122/80 | HR 91 | Temp 98.0°F | Ht 65.0 in | Wt 223.0 lb

## 2021-05-09 DIAGNOSIS — Z23 Encounter for immunization: Secondary | ICD-10-CM

## 2021-05-09 DIAGNOSIS — F319 Bipolar disorder, unspecified: Secondary | ICD-10-CM

## 2021-05-09 MED ORDER — TRAMADOL HCL 50 MG PO TABS: 50.0000 mg | ORAL_TABLET | Freq: Four times a day (QID) | ORAL | 0 refills | Status: DC | PRN

## 2021-05-09 MED ORDER — DULOXETINE HCL 30 MG PO CPEP: 30.0000 mg | ORAL_CAPSULE | Freq: Every day | ORAL | 3 refills | Status: DC

## 2021-05-09 NOTE — Addendum Note (Signed)
Addended by: Pilar Grammes on: 05/09/2021 04:34 PM   Modules accepted: Orders

## 2021-05-09 NOTE — Progress Notes (Signed)
Subjective:    Patient ID: Bridget Maldonado, female    DOB: 1980-11-22, 40 y.o.   MRN: [id number]  HPI Here for follow up of bipolar disorder This visit occurred during the SARS-CoV-2 public health emergency.  Safety protocols were in place, including screening questions prior to the visit, additional usage of staff PPE, and extensive cleaning of exam room while observing appropriate contact time as indicated for disinfecting solutions.   "I had a mental breakdown" 4 days ago Doesn't even remember it Crying a lot---had to ask her parents to come and be with her (and her daughter) Has noted more "picking" and actually pulling out her eyelashes (doesn't even remember doing her"  Just got married (not daughter's father) Things good with that Daughter having struggles with screaming, writing on walls. Being evaluated for autism (38 years old) Her work is okay  Some recent irritability---things "getting on my nerves more"  Current Outpatient Medications on File Prior to Visit  Medication Sig Dispense Refill   albuterol (PROVENTIL HFA;VENTOLIN HFA) 108 (90 Base) MCG/ACT inhaler Inhale 2 puffs into the lungs every 6 (six) hours as needed for wheezing or shortness of breath. 1 Inhaler 1   ALPRAZolam (XANAX) 0.25 MG tablet Take 1 tablet (0.25 mg total) by mouth 2 (two) times daily as needed for anxiety. 60 tablet 0   ARIPiprazole (ABILIFY) 5 MG tablet Take 1 tablet (5 mg total) by mouth at bedtime. 90 tablet 3   cetirizine (ZYRTEC) 10 MG tablet Take 10 mg by mouth daily.     DULoxetine (CYMBALTA) 60 MG capsule Take 1 capsule (60 mg total) by mouth daily. 90 capsule 3   Fluticasone-Salmeterol (ADVAIR) 250-50 MCG/DOSE AEPB Inhale 1 puff into the lungs 2 (two) times daily. 180 each 3   mometasone (ELOCON) 0.1 % ointment APPLY TWICE DAILY AS NEEDED FOR PSORIASIS 45 g 1   traMADol (ULTRAM) 50 MG tablet Take 1 tablet (50 mg total) by mouth every 6 (six) hours as needed for moderate pain or severe pain.  30 tablet 0   losartan (COZAAR) 50 MG tablet Take 1 tablet (50 mg total) by mouth daily. 90 tablet 3   No current facility-administered medications on file prior to visit.    Allergies  Allergen Reactions   Sulfa Antibiotics Nausea And Vomiting   Ciprofloxacin Nausea And Vomiting   Effexor [Venlafaxine Hcl] Other (See Comments)    Reaction:  Made pt sleep for two days straight.    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 Hulen Skains   TUBAL LIGATION  2017   TYMPANOSTOMY TUBE PLACEMENT      Family History  Problem Relation Age of Onset   Colon polyps Maternal Grandfather    Parkinson's disease Maternal Grandfather    Breast cancer Mother 52   Rheum arthritis Mother    Irritable bowel syndrome Mother    Asthma Father    Anxiety disorder Father        Severe, possible bipolar   Irritable bowel syndrome Maternal Grandmother    Diabetes Other        mat. great uncles   Alzheimer's disease Other        Dad's side   Colon cancer Other        mat GGF    Social History   Socioeconomic History   Marital status: Married    Spouse name: Not on file   Number of children: 1   Years of education: Not on file   Highest education level: Not on  file  Occupational History   Occupation: Office work    Comment: [employer]  Tobacco Use   Smoking status: Never   Smokeless tobacco: Never  Vaping Use   Vaping Use: Never used  Substance and Sexual Activity   Alcohol use: No    Alcohol/week: 0.0 standard drinks   Drug use: No   Sexual activity: Yes    Birth control/protection: Surgical  Other Topics Concern   Not on file  Social History Narrative   Married 10/22   Social Determinants of Health   Financial Resource Strain: Not on file  Food Insecurity: Not on file  Transportation Needs: Not on file  Physical Activity: Not on file  Stress: Not on file  Social Connections: Not on file  Intimate Partner Violence: Not on file   Review of Systems Has gained a lot of weight this year---but it is mostly in the past month Doesn't really feel like she has much appetite Sleeping more     Objective:   Physical Exam Constitutional:      Appearance: Normal appearance.  Neurological:     Mental Status: She is alert.  Psychiatric:     Comments: Calm Seems to have insight and no clear impairment of judgement No thought process disturbances           Assessment & Plan:

## 2021-05-09 NOTE — Assessment & Plan Note (Signed)
Atypical bipolar---irritability, etc More depressed lately---had spell a few days ago Will continue the abilify Increase duloxetine to 90mg  daily Refer to psychiatry--Dr Nicolasa Ducking if possible

## 2021-05-28 ENCOUNTER — Encounter: Payer: Self-pay | Admitting: *Deleted

## 2021-05-28 ENCOUNTER — Telehealth: Payer: Self-pay | Admitting: Internal Medicine

## 2021-05-28 NOTE — Telephone Encounter (Signed)
Bridget Maldonado with Dr Nicolasa Ducking office called stating that they don't take pt insurance.

## 2021-05-29 ENCOUNTER — Other Ambulatory Visit: Payer: Self-pay

## 2021-05-29 ENCOUNTER — Telehealth (INDEPENDENT_AMBULATORY_CARE_PROVIDER_SITE_OTHER): Payer: Medicaid Other | Admitting: Family Medicine

## 2021-05-29 ENCOUNTER — Encounter: Payer: Self-pay | Admitting: Family Medicine

## 2021-05-29 DIAGNOSIS — J209 Acute bronchitis, unspecified: Secondary | ICD-10-CM | POA: Diagnosis not present

## 2021-05-29 NOTE — Progress Notes (Signed)
Virtual Visit via Video Note  I connected with Bridget Maldonado on 05/29/21 at  3:30 PM EST by a video enabled telemedicine application and verified that I am speaking with the correct person using two identifiers.  Location: Patient: home Provider: office   I discussed the limitations of evaluation and management by telemedicine and the availability of in person appointments. The patient expressed understanding and agreed to proceed.  Parties involved in encounter  Patient:home  Provider:  Loura Pardon MD   History of Present Illness: 40 yo pt of Dr Silvio Pate presents with cough, ST and GI symptoms   Very hoarse now  Started symptoms Sunday  Cough is significantly worse   02 level is usually 90-100%  Uses an inhaler- albuterol  Tolerating  Some wheezing  Cough is productive of thick mucous - yellow/brown color    She was seen in UC on 05/27/21 with uri symptoms  [facility] Resp virus panel was sent  (returned neg for flu and RSV and covid)  Tx with doxycycline for suspected bronchitis along with prednisone 20 mg bid for 5d Albuterol and tessalon as well as hydrocodone (hycodan 3 d supply)  She has taken mucinex pill  Has zyrtec but has not taken it   Her mother was sick before she did - same hoarseness   Drinking lots of fluids   She has psoriatic arthritis Has tramadol at home- has not used recently  Patient Active Problem List   Diagnosis Date Noted   COVID-19 virus infection 07/14/2020   Preventative health care 06/13/2020   Bipolar disease, chronic ([place]) 07/27/2018   Abnormal MRI, breast 09/30/2017   Family history of colon cancer 07/27/2017   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   History of cervical dysplasia 07/16/2017   Family history of breast cancer 07/16/2017   Secondary oligomenorrhea 07/16/2017   History of endometriosis 07/16/2017   Psoriatic arthritis ([place]) 10/28/2016   Chronic low back pain 10/28/2016   Acute bronchitis 05/14/2015    Essential (primary) hypertension 10/18/2014   Arthritis 11/24/2013   Arthropathia 11/24/2013   Obstipation 04/12/2013   Migraine with status migrainosus 09/08/2012   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   Mild persistent asthma 05/26/2011   Severe anxiety with panic 08/29/2006   Panic disorder without agoraphobia 08/29/2006   Past Medical History:  Diagnosis Date   Anxiety    Possible bipolar, Type II   Arthritis    Asthma    Bipolar disorder ([place])    BRCA negative 07/2017   vistaseq neg   Chronic headaches    Endometriosis 2018   Family history of breast cancer    Hemorrhoids 08/2007   IBS (irritable bowel syndrome) 08/2007   Ileitis, 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 Hulen Skains   TUBAL LIGATION  2017   TYMPANOSTOMY TUBE PLACEMENT     Social History   Tobacco Use   Smoking status: Never  Smokeless tobacco: Never  Vaping Use   Vaping Use: Never used  Substance Use Topics   Alcohol use: No    Alcohol/week: 0.0 standard drinks   Drug use: No   Family History  Problem Relation Age of Onset   Colon polyps Maternal Grandfather    Parkinson's disease Maternal Grandfather    Breast cancer Mother 76   Rheum arthritis Mother    Irritable bowel syndrome Mother    Asthma Father    Anxiety disorder Father        Severe, possible bipolar   Irritable bowel syndrome Maternal Grandmother    Diabetes Other        mat. great uncles   Alzheimer's disease Other        Dad's side   Colon cancer Other        mat GGF    Allergies  Allergen Reactions   Sulfa Antibiotics Nausea And Vomiting   Ciprofloxacin Nausea And Vomiting   Effexor [Venlafaxine Hcl] Other (See Comments)    Reaction:  Made pt sleep for two days straight.   Current Outpatient Medications on File Prior to Visit  Medication Sig Dispense Refill   albuterol (PROVENTIL HFA;VENTOLIN HFA) 108 (90 Base) MCG/ACT inhaler Inhale 2 puffs into the lungs every 6 (six) hours as needed for wheezing or shortness of breath. 1 Inhaler 1   ALPRAZolam (XANAX) 0.25 MG tablet Take 1 tablet (0.25 mg total) by mouth 2 (two) times daily as needed for anxiety. 60 tablet 0   ARIPiprazole (ABILIFY) 5 MG tablet Take 1 tablet (5 mg total) by mouth at bedtime. 90 tablet 3   cetirizine (ZYRTEC) 10 MG tablet Take 10 mg by mouth daily.     DULoxetine (CYMBALTA) 30 MG capsule Take 1 capsule (30 mg total) by mouth daily. 30 capsule 3   DULoxetine (CYMBALTA) 60 MG capsule Take 1 capsule (60 mg total) by mouth daily. 90 capsule 3   Fluticasone-Salmeterol (ADVAIR) 250-50 MCG/DOSE AEPB Inhale 1 puff into the lungs 2 (two) times daily. 180 each 3   mometasone (ELOCON) 0.1 % ointment APPLY TWICE DAILY AS NEEDED FOR PSORIASIS 45 g 1   traMADol (ULTRAM) 50 MG tablet Take 1 tablet (50 mg total) by mouth every 6 (six) hours as needed for moderate pain or severe pain. 30 tablet 0   losartan (COZAAR) 50 MG tablet Take 1 tablet (50 mg total) by mouth daily. 90 tablet 3   No current facility-administered medications on file prior to visit.     Review of Systems  Constitutional:  Positive for malaise/fatigue. Negative for chills and fever.  HENT:  Positive for congestion and sore throat. Negative for ear pain and sinus pain.        Hoarse voice  Eyes:  Negative for blurred vision, discharge and redness.  Respiratory:  Positive for cough, sputum production and wheezing. Negative for shortness of breath and stridor.   Cardiovascular:  Negative for chest pain, palpitations and leg  swelling.  Gastrointestinal:  Negative for abdominal pain, diarrhea, nausea and vomiting.  Musculoskeletal:  Negative for myalgias.  Skin:  Negative for rash.  Neurological:  Positive for headaches. Negative for dizziness.   Observations/Objective: Patient appears well, in no distress (is fatigued) Weight is baseline  No facial swelling or asymmetry Very hoarse voice No obvious tremor or mobility impairment Moving neck and UEs normally Able to hear the call well  No audible wheeze or sob Frequent dry sounding cough noted Talkative and mentally sharp with  no cognitive changes No skin changes on face or neck , no rash or pallor Affect is normal    Assessment and Plan:  Problem List Items Addressed This Visit       Respiratory   Acute bronchitis    Symptoms since Monday with hoarse voice and cough and some wheeze Reviewed UC notes from 05/27/21 Neg RSV and covid and flu tests Adv to continue prednisone and cough medications  Can try tramadol instead of hydrocodone to see if it works better  [employer] the doxycycline Zyrtec may help cough also  Encouraged fluids/rest and salt water gargles   Reviewed ER precautions       Follow Up Instructions: Drink fluids and rest  Continue your current medications   For cough- instead of hydrocodone try your tramadol for the next dose to see if that helps more   Try zyrtec 10 mg at bedtime for runny nose and cough also   If you have sudden shortness of breath please go to the ER If not starting to improve in the next few days let us know  Rest your voice  Gargle with salt water for throat    I discussed the assessment and treatment plan with the patient. The patient was provided an opportunity to ask questions and all were answered. The patient agreed with the plan and demonstrated an understanding of the instructions.   The patient was advised to call back or seek an in-person evaluation if the symptoms worsen or if the condition  fails to improve as anticipated.     Loura Pardon, MD

## 2021-05-29 NOTE — Assessment & Plan Note (Signed)
Symptoms since Monday with hoarse voice and cough and some wheeze Reviewed UC notes from 05/27/21 Neg RSV and covid and flu tests Adv to continue prednisone and cough medications  Can try tramadol instead of hydrocodone to see if it works better  [employer] the doxycycline Zyrtec may help cough also  Encouraged fluids/rest and salt water gargles   Reviewed ER precautions

## 2021-05-29 NOTE — Patient Instructions (Signed)
Drink fluids and rest  Continue your current medications   For cough- instead of hydrocodone try your tramadol for the next dose to see if that helps more   Try zyrtec 10 mg at bedtime for runny nose and cough also   If you have sudden shortness of breath please go to the ER If not starting to improve in the next few days let us know  Rest your voice  Gargle with salt water for throat

## 2021-05-30 NOTE — Telephone Encounter (Signed)
[facility] as they accept Medicaid insurance.  I left them a message regarding the referral I have faxed over.  The patient needs to call them to schedule.  [facility] Address: [address] [place], [place] [address] Phone: [phone]

## 2021-06-05 ENCOUNTER — Other Ambulatory Visit: Payer: Self-pay | Admitting: Internal Medicine

## 2021-06-05 MED ORDER — TRAMADOL HCL 50 MG PO TABS: 50.0000 mg | ORAL_TABLET | Freq: Four times a day (QID) | ORAL | 0 refills | Status: DC | PRN

## 2021-06-26 ENCOUNTER — Other Ambulatory Visit: Payer: Self-pay | Admitting: Internal Medicine

## 2021-06-27 MED ORDER — ALPRAZOLAM 0.25 MG PO TABS: 0.2500 mg | ORAL_TABLET | Freq: Two times a day (BID) | ORAL | 0 refills | Status: DC | PRN

## 2021-06-27 NOTE — Telephone Encounter (Signed)
Last filled 04-30-21 #60 Last OV  with Dr Silvio Pate 05-09-21 Next OV 07-09-21 [place]

## 2021-07-08 ENCOUNTER — Encounter: Payer: Self-pay | Admitting: *Deleted

## 2021-07-08 ENCOUNTER — Emergency Department: Payer: BLUE CROSS/BLUE SHIELD

## 2021-07-08 ENCOUNTER — Other Ambulatory Visit: Payer: Self-pay

## 2021-07-08 DIAGNOSIS — I10 Essential (primary) hypertension: Secondary | ICD-10-CM | POA: Diagnosis not present

## 2021-07-08 DIAGNOSIS — R0789 Other chest pain: Secondary | ICD-10-CM | POA: Diagnosis not present

## 2021-07-08 DIAGNOSIS — J45909 Unspecified asthma, uncomplicated: Secondary | ICD-10-CM | POA: Diagnosis not present

## 2021-07-08 DIAGNOSIS — R0602 Shortness of breath: Secondary | ICD-10-CM | POA: Insufficient documentation

## 2021-07-08 DIAGNOSIS — R6884 Jaw pain: Secondary | ICD-10-CM | POA: Insufficient documentation

## 2021-07-08 LAB — BASIC METABOLIC PANEL
Anion gap: 6 (ref 5–15)
BUN: 16 mg/dL (ref 6–20)
CO2: 24 mmol/L (ref 22–32)
Calcium: 8.9 mg/dL (ref 8.9–10.3)
Chloride: 104 mmol/L (ref 98–111)
Creatinine, Ser: 0.77 mg/dL (ref 0.44–1.00)
GFR, Estimated: >60 mL/min (ref >60)
Glucose, Bld: 117 mg/dL — ABNORMAL HIGH (ref 70–99)
Potassium: 3.2 mmol/L — ABNORMAL LOW (ref 3.5–5.1)
Sodium: 134 mmol/L — ABNORMAL LOW (ref 135–145)

## 2021-07-08 LAB — CBC
HCT: 43.4 % (ref 36.0–46.0)
Hemoglobin: 14.3 g/dL (ref 12.0–15.0)
MCH: 28 pg (ref 26.0–34.0)
MCHC: 32.9 g/dL (ref 30.0–36.0)
MCV: 85.1 fL (ref 80.0–100.0)
Platelets: 279 10*3/uL (ref 150–400)
RBC: 5.1 MIL/uL (ref 3.87–5.11)
RDW: 13.6 % (ref 11.5–15.5)
WBC: 8.9 10*3/uL (ref 4.0–10.5)
nRBC: 0 % (ref 0.0–0.2)

## 2021-07-08 LAB — TROPONIN I (HIGH SENSITIVITY): Troponin I (High Sensitivity): <2 ng/L (ref <18)

## 2021-07-08 NOTE — ED Triage Notes (Signed)
Pt has intermittent chest pain for 1 day.  Pt reports pain in mid chest and neck.  Pt also has dizziness  pt alert  speech clear.

## 2021-07-09 ENCOUNTER — Emergency Department
Admission: EM | Admit: 2021-07-09 | Discharge: 2021-07-09 | Disposition: A | Discharge status: Home / Self Care | Payer: BLUE CROSS/BLUE SHIELD | Attending: Emergency Medicine | Admitting: Emergency Medicine

## 2021-07-09 ENCOUNTER — Telehealth: Payer: Self-pay

## 2021-07-09 ENCOUNTER — Encounter: Payer: Medicaid Other | Admitting: Internal Medicine

## 2021-07-09 ENCOUNTER — Other Ambulatory Visit: Payer: Self-pay | Admitting: Internal Medicine

## 2021-07-09 DIAGNOSIS — R079 Chest pain, unspecified: Secondary | ICD-10-CM

## 2021-07-09 DIAGNOSIS — J45909 Unspecified asthma, uncomplicated: Secondary | ICD-10-CM | POA: Diagnosis not present

## 2021-07-09 DIAGNOSIS — R0602 Shortness of breath: Secondary | ICD-10-CM | POA: Diagnosis not present

## 2021-07-09 DIAGNOSIS — R6884 Jaw pain: Secondary | ICD-10-CM | POA: Diagnosis not present

## 2021-07-09 DIAGNOSIS — I10 Essential (primary) hypertension: Secondary | ICD-10-CM | POA: Diagnosis not present

## 2021-07-09 DIAGNOSIS — R0789 Other chest pain: Secondary | ICD-10-CM | POA: Diagnosis present

## 2021-07-09 LAB — POC URINE PREG, ED: Preg Test, Ur: NEGATIVE

## 2021-07-09 LAB — TROPONIN I (HIGH SENSITIVITY): Troponin I (High Sensitivity): <2 ng/L (ref <18)

## 2021-07-09 LAB — D-DIMER, QUANTITATIVE: D-Dimer, Quant: 0.41 ug/mL-FEU (ref 0.00–0.50)

## 2021-07-09 NOTE — ED Provider Notes (Signed)
[facility] Provider Note    Event Date/Time   First MD Initiated Contact with Patient 07/09/21 [phone]     (approximate)   History   Chief Complaint Chest Pain   HPI  Bridget Maldonado is a 41 y.o. female with past medical history of hypertension, asthma, migraines, and anxiety who presents to the ED complaining of chest pain.  Patient reports that earlier today she developed a sensation of tightness and pain in her chest that has persisted over the past few hours.  Pain radiates upwards into her left jaw and seems to be worse with a deep breath.  She also reports some mild difficulty breathing, denies any fevers or cough.  She has not had any pain or swelling in her legs, denies any history of DVT/PE.  She reports one episode of chest discomfort in the past related to a panic attack, but states today's discomfort has been much different.  Pain has not seemed to improve over the course of the day.     Physical Exam   Triage Vital Signs: ED Triage Vitals  Enc Vitals Group     BP 07/08/21 2146 (!) 155/115     Pulse Rate 07/08/21 2146 (!) 105     Resp 07/08/21 2146 20     Temp 07/08/21 2146 98.7 F (37.1 C)     Temp Source 07/08/21 2146 Oral     SpO2 07/08/21 2146 96 %     Weight 07/08/21 2144 220 lb (99.8 kg)     Height 07/08/21 2144 5\' 5"  (1.651 m)     Head Circumference --      Peak Flow --      Pain Score 07/08/21 2143 8     Pain Loc --      Pain Edu? --      Excl. in [place]? --     Most recent vital signs: Vitals:   07/08/21 2146 07/09/21 0005  BP: (!) 155/115 (!) 148/99  Pulse: (!) 105 97  Resp: 20 20  Temp: 98.7 F (37.1 C) 98.7 F (37.1 C)  SpO2: 96% 95%    Constitutional: Alert and oriented. Eyes: Conjunctivae are normal. Head: Atraumatic. Nose: No congestion/rhinnorhea. Mouth/Throat: Mucous membranes are moist.  Cardiovascular: Normal rate, regular rhythm. Grossly normal heart sounds.  2+ radial pulses bilaterally. Respiratory:  Normal respiratory effort.  No retractions. Lungs CTAB.  No chest wall tenderness to palpation. Gastrointestinal: Soft and nontender. No distention. Musculoskeletal: No lower extremity tenderness nor edema.  Neurologic:  Normal speech and language. No gross focal neurologic deficits are appreciated.    ED Results / Procedures / Treatments   Labs (all labs ordered are listed, but only abnormal results are displayed) Labs Reviewed  BASIC METABOLIC PANEL - Abnormal; Notable for the following components:      Result Value   Sodium 134 (*)    Potassium 3.2 (*)    Glucose, Bld 117 (*)    All other components within normal limits  CBC  D-DIMER, QUANTITATIVE  POC URINE PREG, ED  TROPONIN I (HIGH SENSITIVITY)  TROPONIN I (HIGH SENSITIVITY)     EKG  ED ECG REPORT I, Blake Divine, the attending physician, personally viewed and interpreted this ECG.   Date: 07/09/2021  EKG Time: 21:51  Rate: 103  Rhythm: sinus tachycardia  Axis: Normal  Intervals:none  ST&T Change: None  RADIOLOGY Chest x-ray reviewed by me with no infiltrate, edema, or effusion.  PROCEDURES:  Critical Care performed: No  Procedures   MEDICATIONS ORDERED IN ED: Medications - No data to display   IMPRESSION / MDM / [place] / ED COURSE  I reviewed the triage vital signs and the nursing notes.                              41 y.o. female with past medical history of hypertension, migraines, asthma, and anxiety presents to the ED complaining of chest tightness and jaw pain present for the past few hours that is worse with a deep breath and associated with mild shortness of breath.  Differential diagnosis includes, but is not limited to, ACS, PE, pneumonia, pneumothorax, musculoskeletal pain, GERD, and anxiety.  Patient is nontoxic-appearing and in no acute distress, vital signs are reassuring and patient is not in any respiratory distress, maintaining O2 sats on room air.  EKG shows no  evidence of arrhythmia or ischemia and 2 sets of troponin are negative, doubt ACS at this time, especially given atypical symptoms.  Chest x-ray reviewed by me with no infiltrate, edema, or effusion.  Remainder of labs are unremarkable, CBC and BMP show no anemia or electrolyte abnormality.  We cannot rule out PE based off of PERC criteria given patient's initial tachycardia, we will check D-dimer given she is low risk by Wells criteria.  If this is negative, patient be appropriate for outpatient management and follow-up with her PCP.  D-dimer is within normal limits and I have low suspicion for PE.  Admission was considered but not indicated given patient's heart score of less than 4 with 2 sets of negative troponin.  She is appropriate for outpatient management and follow-up with her PCP, was counseled to return to the ED for new worsening symptoms.  Patient agrees with plan.      FINAL CLINICAL IMPRESSION(S) / ED DIAGNOSES   Final diagnoses:  Nonspecific chest pain     Rx / DC Orders   ED Discharge Orders     None        Note:  This document was prepared using Dragon voice recognition software and may include unintentional dictation errors.   Blake Divine, MD 07/09/21 [phone]

## 2021-07-09 NOTE — Telephone Encounter (Signed)
Left message on verified VM per DPR to see how pt is doing after ER visit. If she calls back, please make note here. Thanks.

## 2021-07-10 MED ORDER — TRAMADOL HCL 50 MG PO TABS: 50.0000 mg | ORAL_TABLET | Freq: Four times a day (QID) | ORAL | 0 refills | Status: DC | PRN

## 2021-07-10 NOTE — Telephone Encounter (Signed)
Last filled 06-05-21 #30 Last OV Acute 05-29-21 Next OV 09-11-21 [place]

## 2021-08-05 ENCOUNTER — Other Ambulatory Visit: Payer: Self-pay | Admitting: Internal Medicine

## 2021-08-05 NOTE — Telephone Encounter (Signed)
Last filled 07-10-21 #30 Last OV Acute 05-29-21 Next OV 09-11-21 [place]

## 2021-08-20 ENCOUNTER — Other Ambulatory Visit: Payer: Self-pay | Admitting: Internal Medicine

## 2021-08-20 MED ORDER — ALPRAZOLAM 0.25 MG PO TABS: 0.2500 mg | ORAL_TABLET | Freq: Two times a day (BID) | ORAL | 0 refills | Status: DC | PRN

## 2021-08-20 NOTE — Telephone Encounter (Signed)
Last filled 06-27-21 #60 Last OV 05-09-21 Next OV 09-12-21 [place]

## 2021-09-11 ENCOUNTER — Other Ambulatory Visit: Payer: Self-pay

## 2021-09-11 ENCOUNTER — Ambulatory Visit (INDEPENDENT_AMBULATORY_CARE_PROVIDER_SITE_OTHER): Payer: BC Managed Care – PPO | Admitting: Internal Medicine

## 2021-09-11 ENCOUNTER — Other Ambulatory Visit: Payer: Self-pay | Admitting: Internal Medicine

## 2021-09-11 ENCOUNTER — Encounter: Payer: Self-pay | Admitting: Internal Medicine

## 2021-09-11 VITALS — BP 122/80 | HR 85 | Temp 98.0°F | Ht 65.0 in | Wt 228.0 lb

## 2021-09-11 DIAGNOSIS — Z23 Encounter for immunization: Secondary | ICD-10-CM | POA: Diagnosis not present

## 2021-09-11 DIAGNOSIS — F319 Bipolar disorder, unspecified: Secondary | ICD-10-CM

## 2021-09-11 DIAGNOSIS — L4050 Arthropathic psoriasis, unspecified: Secondary | ICD-10-CM | POA: Diagnosis not present

## 2021-09-11 DIAGNOSIS — Z0000 Encounter for general adult medical examination without abnormal findings: Secondary | ICD-10-CM | POA: Diagnosis not present

## 2021-09-11 DIAGNOSIS — J4531 Mild persistent asthma with (acute) exacerbation: Secondary | ICD-10-CM | POA: Diagnosis not present

## 2021-09-11 MED ORDER — TRAMADOL HCL 50 MG PO TABS: 50.0000 mg | ORAL_TABLET | Freq: Two times a day (BID) | ORAL | 0 refills | Status: DC | PRN

## 2021-09-11 MED ORDER — MONTELUKAST SODIUM 10 MG PO TABS: 10.0000 mg | ORAL_TABLET | Freq: Every day | ORAL | 3 refills | Status: DC

## 2021-09-11 NOTE — Addendum Note (Signed)
Addended by: Pilar Grammes on: 09/11/2021 11:52 AM ? ? Modules accepted: Orders ? ?

## 2021-09-11 NOTE — Assessment & Plan Note (Signed)
Acute illness but not tight ?If worsens, will try antibiotic ?Add montelukast to the advair ?

## 2021-09-11 NOTE — Progress Notes (Signed)
? ?Subjective:  ? ? Patient ID: Bridget Maldonado, female    DOB: [birth year], 41 y.o.   MRN: [id number] ? ?HPI ?Here for physical ? ?Recently married---happy about that ?New job ? ?Has had some recent respiratory symptoms ?Bad headache 2 days ago--missed work ?Worse yesterday--COVID negative ?Does take advair every day---no SOB now ?Some cough---with "junk" at night only ?Uses albuterol prn ? ?Mood has been okay----the psychiatrist (seen once)increased abilify to 10, but she doesn't feel right on that ? ?No longer seeing rheumatologist--Dr Meda Coffee left ?Ongoing arthritis problems---back, ankles, psoriasis flares ? ?Current Outpatient Medications on File Prior to Visit  ?Medication Sig Dispense Refill  ? albuterol (PROVENTIL HFA;VENTOLIN HFA) 108 (90 Base) MCG/ACT inhaler Inhale 2 puffs into the lungs every 6 (six) hours as needed for wheezing or shortness of breath. 1 Inhaler 1  ? ALPRAZolam (XANAX) 0.25 MG tablet Take 1 tablet (0.25 mg total) by mouth 2 (two) times daily as needed for anxiety. 60 tablet 0  ? ARIPiprazole (ABILIFY) 5 MG tablet Take 1 tablet (5 mg total) by mouth at bedtime. 90 tablet 3  ? cetirizine (ZYRTEC) 10 MG tablet Take 10 mg by mouth daily.    ? DULoxetine (CYMBALTA) 30 MG capsule Take 1 capsule (30 mg total) by mouth daily. 30 capsule 3  ? DULoxetine (CYMBALTA) 60 MG capsule Take 1 capsule (60 mg total) by mouth daily. 90 capsule 3  ? Fluticasone-Salmeterol (ADVAIR) 250-50 MCG/DOSE AEPB Inhale 1 puff into the lungs 2 (two) times daily. 180 each 3  ? mometasone (ELOCON) 0.1 % ointment APPLY TWICE DAILY AS NEEDED FOR PSORIASIS 45 g 1  ? traMADol (ULTRAM) 50 MG tablet TAKE 1 TABLET BY MOUTH EVERY 6 HOURS AS NEEDED FOR MODERATE OR SEVERE PAIN 30 tablet 0  ? losartan (COZAAR) 50 MG tablet Take 1 tablet (50 mg total) by mouth daily. 90 tablet 3  ? ?No current facility-administered medications on file prior to visit.  ? ? ?Allergies  ?Allergen Reactions  ? Sulfa Antibiotics Nausea And Vomiting  ?  Ciprofloxacin Nausea And Vomiting  ? Effexor [Venlafaxine Hcl] Other (See Comments)  ?  Reaction:  Made pt sleep for two days straight.  ? ? ?Past Medical History:  ?Diagnosis Date  ? Anxiety   ? Possible bipolar, Type II  ? Arthritis   ? Asthma   ? Bipolar disorder ([place])   ? BRCA negative 07/2017  ? vistaseq neg  ? Chronic headaches   ? Endometriosis 2018  ? Family history of breast cancer   ? Hemorrhoids 08/2007  ? IBS (irritable bowel syndrome) 08/2007  ? 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 Increased risk of breast cancer 07/2017  ? IBIS=40%  ? Kidney infection   ? LGSIL on Pap smear of cervix   ? Migraines   ? Multiple allergies   ? Obese   ? Psoriasis   ? Psoriatic arthritis ([place])   ? ? ?Past Surgical History:  ?Procedure Laterality Date  ? BREAST BIOPSY Left 8 years  ? CESAREAN SECTION    ? COLONOSCOPY    ? COLONOSCOPY W/ BIOPSIES    ? EAR TUBE REMOVAL    ? ESOPHAGOGASTRODUODENOSCOPY    ? LAPAROSCOPIC CHOLECYSTECTOMY  6/09  ? Dr. Hulen Skains  ? TUBAL LIGATION  2017  ? TYMPANOSTOMY TUBE PLACEMENT    ? ? ?Family History  ?Problem Relation Age of Onset  ? Colon polyps Maternal Grandfather   ? Parkinson's disease Maternal Grandfather   ? Breast cancer Mother 91  ?  Rheum arthritis Mother   ? Irritable bowel syndrome Mother   ? Asthma Father   ? Anxiety disorder Father   ?     Severe, possible bipolar  ? Irritable bowel syndrome Maternal Grandmother   ? Diabetes Other   ?     mat. great uncles  ? Alzheimer's disease Other   ?     Dad's side  ? Colon cancer Other   ?     mat GGF  ? ? ?Social History  ? ?Socioeconomic History  ? Marital status: Married  ?  Spouse name: Not on file  ? Number of children: 1  ? Years of education: Not on file  ? Highest education level: Not on file  ?Occupational History  ? Occupation: Administration  ?  Employer: [employer]  ?Tobacco Use  ? Smoking status: Never  ?  Passive exposure: Past  ? Smokeless tobacco: Never  ?Vaping Use  ? Vaping Use: Never used  ?Substance and Sexual Activity  ?  Alcohol use: No  ?  Alcohol/week: 0.0 standard drinks  ? Drug use: No  ? Sexual activity: Yes  ?  Birth control/protection: Surgical  ?Other Topics Concern  ? Not on file  ?Social History Narrative  ? Married 10/22  ? ?Social Determinants of Health  ? ?Financial Resource Strain: Not on file  ?Food Insecurity: Not on file  ?Transportation Needs: Not on file  ?Physical Activity: Not on file  ?Stress: Not on file  ?Social Connections: Not on file  ?Intimate Partner Violence: Not on file  ? ?Review of Systems  ?Constitutional:   ?     Inconsistent with gym--tries to walk ?Weight is up some ?Wears seat belt  ?HENT:  Negative for hearing loss and tinnitus.   ?     Overdue for dentist  ?Eyes:  Negative for visual disturbance.  ?     No diplopia or unilateral vision loss  ?Respiratory:  Positive for cough, shortness of breath and wheezing.   ?Cardiovascular:  Positive for palpitations. Negative for chest pain and leg swelling.  ?Gastrointestinal:  Negative for blood in stool.  ?     IBS---variable slow or loose  ?Endocrine: Negative for polydipsia and polyuria.  ?Genitourinary:  Negative for dyspareunia and dysuria.  ?     No period in a long time--then bled for 2.5 weeks. ?Due for gyn---goes to [facility] ?Had tubal ?Thinks she had early menopause back when on methotrexate  ?Musculoskeletal:  Positive for arthralgias, back pain and joint swelling.  ?Skin:   ?     Some rash under breasts  ?Allergic/Immunologic: Positive for environmental allergies. Negative for immunocompromised state.  ?     Zyrtec only  ?Neurological:  Positive for headaches. Negative for dizziness, syncope and light-headedness.  ?     More migraines recently   ?Hematological:  Negative for adenopathy. Does not bruise/bleed easily.  ?Psychiatric/Behavioral:  Positive for dysphoric mood and sleep disturbance. The patient is nervous/anxious.   ? ?   ?Objective:  ? Physical Exam ?Constitutional:   ?   Appearance: Normal appearance.  ?HENT:  ?   Ears:  ?    Comments: Mild scarring on TMs--but no inflammation ?   Mouth/Throat:  ?   Pharynx: No oropharyngeal exudate or posterior oropharyngeal erythema.  ?Eyes:  ?   Conjunctiva/sclera: Conjunctivae normal.  ?   Pupils: Pupils are equal, round, and reactive to light.  ?Cardiovascular:  ?   Rate and Rhythm: Normal rate and regular rhythm.  ?  Pulses: Normal pulses.  ?   Heart sounds: No murmur heard. ?  No gallop.  ?Pulmonary:  ?   Effort: Pulmonary effort is normal.  ?   Breath sounds: Normal breath sounds. No wheezing or rales.  ?Abdominal:  ?   Palpations: Abdomen is soft.  ?   Tenderness: There is no abdominal tenderness.  ?Musculoskeletal:  ?   Cervical back: Neck supple.  ?   Right lower leg: No edema.  ?   Left lower leg: No edema.  ?Lymphadenopathy:  ?   Cervical: No cervical adenopathy.  ?Skin: ?   Findings: No lesion or rash.  ?Neurological:  ?   General: No focal deficit present.  ?   Mental Status: She is alert and oriented to person, place, and time.  ?Psychiatric:     ?   Mood and Affect: Mood normal.     ?   Behavior: Behavior normal.  ?  ? ? ? ? ?   ?Assessment & Plan:  ? ?

## 2021-09-11 NOTE — Assessment & Plan Note (Signed)
Having pain ?Off remitting Rx now----asked her to set back up with rheum (if they can't get her in at [facility], will refer in [place]) ?

## 2021-09-11 NOTE — Patient Instructions (Signed)
Please check with [facility] about a rheumatologist---and let me know if they are not going to set you up with someone new. ?

## 2021-09-11 NOTE — Assessment & Plan Note (Signed)
Has seen psychiatrist once ?Didn't like the higher aripiprazole--will go back to 5 ?Duloxetine 90 daily ?Prn xanax ?

## 2021-09-11 NOTE — Assessment & Plan Note (Signed)
Needs to get back to more exercise ?Will set up with gyn ?Tdap today ?Flu vaccine in the fall ?Recommended COVID vaccination--she will consider ?

## 2021-10-03 ENCOUNTER — Emergency Department
Admission: EM | Admit: 2021-10-03 | Discharge: 2021-10-03 | Disposition: A | Discharge status: Home / Self Care | Payer: BC Managed Care – PPO | Attending: Emergency Medicine | Admitting: Emergency Medicine

## 2021-10-03 ENCOUNTER — Ambulatory Visit
Admission: EM | Admit: 2021-10-03 | Discharge: 2021-10-03 | Disposition: A | Discharge status: Home / Self Care | Payer: BC Managed Care – PPO | Attending: Student | Admitting: Student

## 2021-10-03 ENCOUNTER — Emergency Department: Payer: BC Managed Care – PPO

## 2021-10-03 ENCOUNTER — Other Ambulatory Visit: Payer: Self-pay

## 2021-10-03 DIAGNOSIS — R030 Elevated blood-pressure reading, without diagnosis of hypertension: Secondary | ICD-10-CM | POA: Insufficient documentation

## 2021-10-03 DIAGNOSIS — E876 Hypokalemia: Secondary | ICD-10-CM

## 2021-10-03 DIAGNOSIS — R1032 Left lower quadrant pain: Secondary | ICD-10-CM | POA: Diagnosis not present

## 2021-10-03 DIAGNOSIS — I10 Essential (primary) hypertension: Secondary | ICD-10-CM

## 2021-10-03 DIAGNOSIS — N939 Abnormal uterine and vaginal bleeding, unspecified: Secondary | ICD-10-CM

## 2021-10-03 DIAGNOSIS — N8500 Endometrial hyperplasia, unspecified: Secondary | ICD-10-CM

## 2021-10-03 DIAGNOSIS — R9389 Abnormal findings on diagnostic imaging of other specified body structures: Secondary | ICD-10-CM | POA: Diagnosis not present

## 2021-10-03 DIAGNOSIS — R102 Pelvic and perineal pain: Secondary | ICD-10-CM | POA: Diagnosis not present

## 2021-10-03 DIAGNOSIS — Z853 Personal history of malignant neoplasm of breast: Secondary | ICD-10-CM | POA: Diagnosis not present

## 2021-10-03 LAB — COMPREHENSIVE METABOLIC PANEL
ALT: 19 U/L (ref 0–44)
AST: 21 U/L (ref 15–41)
Albumin: 3.6 g/dL (ref 3.5–5.0)
Alkaline Phosphatase: 110 U/L (ref 38–126)
Anion gap: 9 (ref 5–15)
BUN: 14 mg/dL (ref 6–20)
CO2: 25 mmol/L (ref 22–32)
Calcium: 9.2 mg/dL (ref 8.9–10.3)
Chloride: 104 mmol/L (ref 98–111)
Creatinine, Ser: 0.69 mg/dL (ref 0.44–1.00)
GFR, Estimated: >60 mL/min (ref >60)
Glucose, Bld: 123 mg/dL — ABNORMAL HIGH (ref 70–99)
Potassium: 3.2 mmol/L — ABNORMAL LOW (ref 3.5–5.1)
Sodium: 138 mmol/L (ref 135–145)
Total Bilirubin: 0.5 mg/dL (ref 0.3–1.2)
Total Protein: 7.7 g/dL (ref 6.5–8.1)

## 2021-10-03 LAB — CBC WITH DIFFERENTIAL/PLATELET
Abs Immature Granulocytes: 0.01 10*3/uL (ref 0.00–0.07)
Basophils Absolute: 0 10*3/uL (ref 0.0–0.1)
Basophils Relative: 1 %
Eosinophils Absolute: 0.2 10*3/uL (ref 0.0–0.5)
Eosinophils Relative: 3 %
HCT: 44.3 % (ref 36.0–46.0)
Hemoglobin: 14.4 g/dL (ref 12.0–15.0)
Immature Granulocytes: 0 %
Lymphocytes Relative: 28 %
Lymphs Abs: 2 10*3/uL (ref 0.7–4.0)
MCH: 28 pg (ref 26.0–34.0)
MCHC: 32.5 g/dL (ref 30.0–36.0)
MCV: 86 fL (ref 80.0–100.0)
Monocytes Absolute: 0.3 10*3/uL (ref 0.1–1.0)
Monocytes Relative: 4 %
Neutro Abs: 4.6 10*3/uL (ref 1.7–7.7)
Neutrophils Relative %: 64 %
Platelets: 269 10*3/uL (ref 150–400)
RBC: 5.15 MIL/uL — ABNORMAL HIGH (ref 3.87–5.11)
RDW: 12.9 % (ref 11.5–15.5)
WBC: 7.2 10*3/uL (ref 4.0–10.5)
nRBC: 0 % (ref 0.0–0.2)

## 2021-10-03 LAB — POC URINE PREG, ED: Preg Test, Ur: NEGATIVE

## 2021-10-03 LAB — POCT URINALYSIS DIP (MANUAL ENTRY)
Bilirubin, UA: NEGATIVE
Glucose, UA: NEGATIVE mg/dL
Ketones, POC UA: NEGATIVE mg/dL
Leukocytes, UA: NEGATIVE
Nitrite, UA: NEGATIVE
Protein Ur, POC: NEGATIVE mg/dL
Spec Grav, UA: >=1.03 — AB (ref 1.010–1.025)
Urobilinogen, UA: 0.2 E.U./dL
pH, UA: 6 (ref 5.0–8.0)

## 2021-10-03 LAB — POCT URINE PREGNANCY: Preg Test, Ur: NEGATIVE

## 2021-10-03 MED ORDER — HYDROCODONE-ACETAMINOPHEN 5-325 MG PO TABS
1.0000 | ORAL_TABLET | ORAL | Status: AC
Start: 2021-10-03 — End: 2021-10-03
  Administered 2021-10-03: 1 via ORAL
  Filled 2021-10-03: qty 1

## 2021-10-03 MED ORDER — IBUPROFEN 400 MG PO TABS: 400.0000 mg | ORAL_TABLET | Freq: Four times a day (QID) | ORAL | 0 refills | Status: AC | PRN

## 2021-10-03 MED ORDER — POTASSIUM CHLORIDE CRYS ER 20 MEQ PO TBCR
20.0000 meq | EXTENDED_RELEASE_TABLET | Freq: Once | ORAL | Status: AC
  Administered 2021-10-03: 20 meq via ORAL
  Filled 2021-10-03: qty 1

## 2021-10-03 MED ORDER — HYDROCODONE-ACETAMINOPHEN 7.5-325 MG PO TABS: 0.5000 | ORAL_TABLET | Freq: Four times a day (QID) | ORAL | 0 refills | Status: DC | PRN

## 2021-10-03 NOTE — Discharge Instructions (Addendum)
Please take ibuprofen as prescribed x7 days.  Please make sure to take ibuprofen with food.  You may use Norco as needed for severe pain.  Please call GYN physician, Dr. Andreas Blower office tomorrow to schedule follow-up appointment.  Return to the ER for any fevers, increasing pain, worsening symptoms or to changes in your health. ?

## 2021-10-03 NOTE — ED Provider Notes (Signed)
?[facility] ?Provider Note ? ? ?CSN: [id number] ?Arrival date & time: 10/03/21  1705 ? ?  ? ?History ? ?Chief Complaint  ?Patient presents with  ? Vaginal Bleeding  ? ? ?Bridget Maldonado is a 41 y.o. female, G1, P1 with history of endometriosis, psoriatic arthritis presents to the emergency department for evaluation of left lower quadrant pain.  Patient states she has had 3 months of vaginal bleeding that fluctuates from very heavy bleeding and clots to spotting.  Fluctuations are daily.  She is currently having heavy flow, 4-5 tampons a day.  She went years without having menstrual cycles after a bilateral tubal ligation in 2016 and being on methotrexate.  Recently patient also developed some left lower quadrant abdominal pain that sharp, 7 out of 10.  Pain does not radiate.  She denies any urinary symptoms.  No new medications.  She denies any fevers, chest pain, shortness of breath, nausea vomiting or diarrhea.  Patient does admit to recent weight gain. ? ?HPI ? ?  ? ?Home Medications ?Prior to Admission medications   ?Medication Sig Start Date End Date Taking? Authorizing Provider  ?albuterol (PROVENTIL HFA;VENTOLIN HFA) 108 (90 Base) MCG/ACT inhaler Inhale 2 puffs into the lungs every 6 (six) hours as needed for wheezing or shortness of breath. 04/16/16   Venia Carbon, MD  ?ALPRAZolam Duanne Moron) 0.25 MG tablet Take 1 tablet (0.25 mg total) by mouth 2 (two) times daily as needed for anxiety. 08/20/21   Venia Carbon, MD  ?ARIPiprazole (ABILIFY) 5 MG tablet Take 1 tablet (5 mg total) by mouth at bedtime. 06/13/20   Venia Carbon, MD  ?cetirizine (ZYRTEC) 10 MG tablet Take 10 mg by mouth daily.    [provider]  ?DULoxetine (CYMBALTA) 30 MG capsule Take 1 capsule (30 mg total) by mouth daily. 05/09/21   Venia Carbon, MD  ?DULoxetine (CYMBALTA) 60 MG capsule Take 1 capsule (60 mg total) by mouth daily. 06/13/20   Venia Carbon, MD   ?Fluticasone-Salmeterol (ADVAIR) 250-50 MCG/DOSE AEPB Inhale 1 puff into the lungs 2 (two) times daily. 06/13/20   Venia Carbon, MD  ?losartan (COZAAR) 50 MG tablet Take 1 tablet (50 mg total) by mouth daily. 06/13/20 12/10/20  Viviana Simpler I, MD  ?mometasone (ELOCON) 0.1 % ointment APPLY TWICE DAILY AS NEEDED FOR PSORIASIS 10/15/18   Venia Carbon, MD  ?montelukast (SINGULAIR) 10 MG tablet Take 1 tablet (10 mg total) by mouth at bedtime. 09/11/21   Venia Carbon, MD  ?traMADol (ULTRAM) 50 MG tablet Take 1 tablet (50 mg total) by mouth 2 (two) times daily as needed. 09/11/21   Venia Carbon, MD  ?   ? ?Allergies    ?Sulfa antibiotics, Ciprofloxacin, and Effexor [venlafaxine hcl]   ? ?Review of Systems   ?Review of Systems ? ?Physical Exam ?Updated Vital Signs ?BP (!) 163/117 (BP Location: Left Arm)   Pulse 97   Temp 98.6 ?F (37 ?C) (Oral)   Resp 17   Ht '5\' 5"'  (1.651 m)   Wt 99.8 kg   LMP 10/03/2021 Comment: signed preg waiver  SpO2 97%   BMI 36.61 kg/m?  ?Physical Exam ?Constitutional:   ?   Appearance: She is well-developed.  ?HENT:  ?   Head: Normocephalic and atraumatic.  ?Eyes:  ?   Conjunctiva/sclera: Conjunctivae normal.  ?Cardiovascular:  ?   Rate and Rhythm: Normal rate.  ?Pulmonary:  ?   Effort: Pulmonary effort is  normal. No respiratory distress.  ?Abdominal:  ?   General: There is no distension.  ?   Tenderness: There is abdominal tenderness. There is no guarding.  ?   Comments: Left lower quadrant abdominal pain, mild with deep palpation  ?Musculoskeletal:     ?   General: Normal range of motion.  ?   Cervical back: Normal range of motion.  ?Skin: ?   General: Skin is warm.  ?   Findings: No rash.  ?Neurological:  ?   General: No focal deficit present.  ?   Mental Status: She is alert and oriented to person, place, and time.  ?Psychiatric:     ?   Behavior: Behavior normal.     ?   Thought Content: Thought content normal.  ? ? ?ED Results / Procedures / Treatments   ?Labs ?(all  labs ordered are listed, but only abnormal results are displayed) ?Labs Reviewed  ?CBC WITH DIFFERENTIAL/PLATELET - Abnormal; Notable for the following components:  ?    Result Value  ? RBC 5.15 (*)   ? All other components within normal limits  ?COMPREHENSIVE METABOLIC PANEL  ?POC URINE PREG, ED  ? ? ?EKG ?None ? ?Radiology ?No results found. ? ?Procedures ?Procedures  ? ? ?Medications Ordered in ED ?Medications - No data to display ? ?ED Course/ Medical Decision Making/ A&P ?  ?                        ?Medical Decision Making ?Amount and/or Complexity of Data Reviewed ?Labs: ordered. ?Radiology: ordered. ? ?Risk ?Prescription drug management. ? ? ?41 year old female, G1, P1 with bilateral tubal ligation in 2016. G1, P1 with bilateral tubal ligation in 2016.  Patient went years with no menstrual period and over last 3 months has had heavy mixed with light vaginal bleeding.  She recently developed some left lower pelvic pain that is mild on exam.  Patient's blood work normal, no signs of any infectious process.  Ultrasound showed endometrial thickening consistent with hyperplasia.  Discussed treatment options with patient, due to BRCA positive and history of breast cancer and family we held medroxyprogesterone and placed her on ibuprofen.  She will call GYN physician to schedule follow-up appointment.  Patient is hemodynamically stable and pain well controlled.  Patient noted to have slight high blood pressure, she will continue with blood pressure monitoring at home and if continued to be elevated will contact PCP.  Patient also noted to have slight hypokalemia but oral potassium supplement was given today.  Patient understands signs symptoms return to the ER for. ?Final Clinical Impression(s) / ED Diagnoses ?Final diagnoses:  ?Vaginal bleeding  ?Left lower quadrant abdominal pain  ? ? ?Rx / DC Orders ?ED Discharge Orders   ? ? None  ? ?  ? ? ?  ?Duanne Guess, PA-C ?10/03/21 2117 ? ?  ?Blake Divine, MD ?10/04/21 1057 ? ?

## 2021-10-03 NOTE — ED Notes (Signed)
Patient is being discharged from the Urgent Care and sent to the Emergency Department via personal car . Per Marin Roberts, PA, patient is in need of higher level of care due to vaginal bleeding for 3 months. Patient is aware and verbalizes understanding of plan of care.  ?Vitals:  ? 10/03/21 1636  ?BP: (!) 154/115  ?Pulse: 100  ?Resp: 16  ?Temp: 98.8 ?F (37.1 ?C)  ?SpO2: 95%  ? ? ?

## 2021-10-03 NOTE — ED Triage Notes (Signed)
Pt c/o vaginal bleeding for the past 3 months, and having LLQ pain, states she had not had a period for a long time before that. ?

## 2021-10-03 NOTE — Discharge Instructions (Addendum)
-  Sent to ED via POV ?-Declines AVS ?

## 2021-10-03 NOTE — ED Notes (Signed)
POC testing urine pregnancy is negative. ?

## 2021-10-03 NOTE — ED Triage Notes (Signed)
Pt is experiencing vaginal bleeding for a few months. Pt states she is passing clots of blood.  ?

## 2021-10-03 NOTE — ED Notes (Addendum)
RN first encounter with pt prior to discharge. Pt verbalized understanding of discharge instructions, prescriptions, and follow-up care instructions. Pt advised if symptoms worsen to return to ED. E-signature not available due to e-signature pad not working.  ?

## 2021-10-03 NOTE — ED Provider Notes (Signed)
?[facility] ? ? ? ?CSN: [id number] ?Arrival date & time: 10/03/21  1532 ? ? ?  ? ?History   ?Chief Complaint ?Chief Complaint  ?Patient presents with  ? Vaginal Bleeding  ? ? ?HPI ?Bridget Maldonado is a 41 y.o. female presenting with vaginal bleeding for 3 months.  History endometriosis, ovarian cyst per patient.  Describes vaginal bleeding with clotting for about 3 months.  Patient states that she is bleeding heavily today and passing clots of blood.  She is unsure of number of pads or tampons as she is using period underwear.  She also notes that she is feeling very fatigued and like her iron might be low.  She notes left lower quadrant pain without radiation.  Denies other symptoms including dysuria, hematuria, frequency, vaginal odor. ? ?HPI ? ?Past Medical History:  ?Diagnosis Date  ? Anxiety   ? Possible bipolar, Type II  ? Arthritis   ? Asthma   ? Bipolar disorder ([place])   ? BRCA negative 07/2017  ? vistaseq neg  ? Chronic headaches   ? Endometriosis 2018  ? Family history of breast cancer   ? Hemorrhoids 08/2007  ? IBS (irritable bowel syndrome) 08/2007  ? 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 Increased risk of breast cancer 07/2017  ? IBIS=40%  ? Kidney infection   ? LGSIL on Pap smear of cervix   ? Migraines   ? Multiple allergies   ? Obese   ? Psoriasis   ? Psoriatic arthritis ([place])   ? ? ?Patient Active Problem List  ? Diagnosis Date Noted  ? Preventative health care 06/13/2020  ? Bipolar disease, chronic ([place]) 07/27/2018  ? Family history of colon cancer 07/27/2017  ? 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  ? History of cervical dysplasia 07/16/2017  ? Family history of breast cancer 07/16/2017  ? Secondary oligomenorrhea 07/16/2017  ? History of endometriosis 07/16/2017  ? Psoriatic arthritis ([place]) 10/28/2016  ? Chronic low back pain 10/28/2016  ? Acute bronchitis 05/14/2015  ? Essential (primary) hypertension 10/18/2014  ? Arthritis 11/24/2013  ? Arthropathia 11/24/2013  ? Obstipation 04/12/2013  ?  Migraine with status migrainosus 09/08/2012  ? 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 Mild persistent asthma 05/26/2011  ? Severe anxiety with panic 08/29/2006  ? Panic disorder without agoraphobia 08/29/2006  ? ? ?Past Surgical History:  ?Procedure Laterality Date  ? BREAST BIOPSY Left 8 years  ? CESAREAN SECTION    ? COLONOSCOPY    ? COLONOSCOPY W/ BIOPSIES    ? EAR TUBE REMOVAL    ? ESOPHAGOGASTRODUODENOSCOPY    ? LAPAROSCOPIC CHOLECYSTECTOMY  6/09  ? Dr. Hulen Skains  ? TUBAL LIGATION  2017  ? TYMPANOSTOMY TUBE PLACEMENT    ? ? ?OB History   ? ? Gravida  ?2  ? Para  ?1  ? Term  ?1  ? Preterm  ?   ? AB  ?1  ? Living  ?1  ?  ? ? SAB  ?   ? IAB  ?1  ? Ectopic  ?   ? Multiple  ?   ? Live Births  ?   ?   ?  ? Obstetric Comments  ?Menstrual age: 89 ? ?Age 1st Pregnancy: 89 ?  ?  ? ?  ? ? ? ?Home Medications   ? ?Prior to Admission medications   ?Medication Sig Start Date End Date Taking? Authorizing Provider  ?albuterol (PROVENTIL HFA;VENTOLIN HFA) 108 (90 Base) MCG/ACT inhaler Inhale 2 puffs  into the lungs every 6 (six) hours as needed for wheezing or shortness of breath. 04/16/16   Venia Carbon, MD  ?ALPRAZolam Duanne Moron) 0.25 MG tablet Take 1 tablet (0.25 mg total) by mouth 2 (two) times daily as needed for anxiety. 08/20/21   Venia Carbon, MD  ?ARIPiprazole (ABILIFY) 5 MG tablet Take 1 tablet (5 mg total) by mouth at bedtime. 06/13/20   Venia Carbon, MD  ?cetirizine (ZYRTEC) 10 MG tablet Take 10 mg by mouth daily.    [provider]  ?DULoxetine (CYMBALTA) 30 MG capsule Take 1 capsule (30 mg total) by mouth daily. 05/09/21   Venia Carbon, MD  ?DULoxetine (CYMBALTA) 60 MG capsule Take 1 capsule (60 mg total) by mouth daily. 06/13/20   Venia Carbon, MD  ?Fluticasone-Salmeterol (ADVAIR) 250-50 MCG/DOSE AEPB Inhale 1 puff into the lungs 2 (two) times daily. 06/13/20   Venia Carbon, MD  ?losartan (COZAAR) 50 MG tablet Take 1 tablet (50 mg total) by mouth daily.  06/13/20 12/10/20  Viviana Simpler I, MD  ?mometasone (ELOCON) 0.1 % ointment APPLY TWICE DAILY AS NEEDED FOR PSORIASIS 10/15/18   Venia Carbon, MD  ?montelukast (SINGULAIR) 10 MG tablet Take 1 tablet (10 mg total) by mouth at bedtime. 09/11/21   Venia Carbon, MD  ?traMADol (ULTRAM) 50 MG tablet Take 1 tablet (50 mg total) by mouth 2 (two) times daily as needed. 09/11/21   Venia Carbon, MD  ? ? ?Family History ?Family History  ?Problem Relation Age of Onset  ? Colon polyps Maternal Grandfather   ? Parkinson's disease Maternal Grandfather   ? Breast cancer Mother 87  ? Rheum arthritis Mother   ? Irritable bowel syndrome Mother   ? Asthma Father   ? Anxiety disorder Father   ?     Severe, possible bipolar  ? Irritable bowel syndrome Maternal Grandmother   ? Diabetes Other   ?     mat. great uncles  ? Alzheimer's disease Other   ?     Dad's side  ? Colon cancer Other   ?     mat GGF  ? ? ?Social History ?Social History  ? ?Tobacco Use  ? Smoking status: Never  ?  Passive exposure: Past  ? Smokeless tobacco: Never  ?Vaping Use  ? Vaping Use: Never used  ?Substance Use Topics  ? Alcohol use: No  ?  Alcohol/week: 0.0 standard drinks  ? Drug use: No  ? ? ? ?Allergies   ?Sulfa antibiotics, Ciprofloxacin, and Effexor [venlafaxine hcl] ? ? ?Review of Systems ?Review of Systems  ?Genitourinary:  Positive for menstrual problem.  ?All other systems reviewed and are negative. ? ? ?Physical Exam ?Triage Vital Signs ?ED Triage Vitals [10/03/21 1636]  ?Enc Vitals Group  ?   BP (!) 154/115  ?   Pulse Rate 100  ?   Resp 16  ?   Temp 98.8 ?F (37.1 ?C)  ?   Temp Source Oral  ?   SpO2 95 %  ?   Weight   ?   Height   ?   Head Circumference   ?   Peak Flow   ?   Pain Score 7  ?   Pain Loc   ?   Pain Edu?   ?   Excl. in [place]?   ? ?No data found. ? ?Updated Vital Signs ?BP (!) 154/115 (BP Location: Left Arm)   Pulse 100   Temp 98.8 ?  F (37.1 ?C) (Oral)   Resp 16   LMP 10/03/2021 Comment: signed preg waiver  SpO2 95%  ? ?Visual  Acuity ?Right Eye Distance:   ?Left Eye Distance:   ?Bilateral Distance:   ? ?Right Eye Near:   ?Left Eye Near:    ?Bilateral Near:    ? ?Physical Exam ?Vitals reviewed.  ?Constitutional:   ?   General: She is not in acute distress. ?   Appearance: Normal appearance. She is not ill-appearing.  ?HENT:  ?   Head: Normocephalic and atraumatic.  ?   Mouth/Throat:  ?   Mouth: Mucous membranes are moist.  ?   Comments: Moist mucous membranes ?Eyes:  ?   Extraocular Movements: Extraocular movements intact.  ?   Pupils: Pupils are equal, round, and reactive to light.  ?Cardiovascular:  ?   Rate and Rhythm: Normal rate and regular rhythm.  ?   Heart sounds: Normal heart sounds.  ?Pulmonary:  ?   Effort: Pulmonary effort is normal.  ?   Breath sounds: Normal breath sounds. No wheezing, rhonchi or rales.  ?Abdominal:  ?   General: Bowel sounds are normal. There is no distension.  ?   Palpations: Abdomen is soft. There is no mass.  ?   Tenderness: There is abdominal tenderness in the left lower quadrant. There is no right CVA tenderness, left CVA tenderness, guarding or rebound.  ?   Comments: LLQ exquisitely tender to palpation.  ?Skin: ?   General: Skin is warm.  ?   Capillary Refill: Capillary refill takes less than 2 seconds.  ?   Comments: Good skin turgor  ?Neurological:  ?   General: No focal deficit present.  ?   Mental Status: She is alert and oriented to person, place, and time.  ?Psychiatric:     ?   Mood and Affect: Mood normal.     ?   Behavior: Behavior normal.  ? ? ? ?UC Treatments / Results  ?Labs ?(all labs ordered are listed, but only abnormal results are displayed) ?Labs Reviewed  ?POCT URINALYSIS DIP (MANUAL ENTRY) - Abnormal; Notable for the following components:  ?    Result Value  ? Spec Grav, UA >=1.030 (*)   ? Blood, UA moderate (*)   ? All other components within normal limits  ?POCT URINE PREGNANCY  ? ? ?EKG ? ? ?Radiology ?No results found. ? ?Procedures ?Procedures (including critical care  time) ? ?Medications Ordered in UC ?Medications - No data to display ? ?Initial Impression / Assessment and Plan / UC Course  ?I have reviewed the triage vital signs and the nursing notes. ? ?Pertinent labs & imaging

## 2021-10-07 ENCOUNTER — Other Ambulatory Visit: Payer: Self-pay | Admitting: Internal Medicine

## 2021-10-07 ENCOUNTER — Ambulatory Visit (INDEPENDENT_AMBULATORY_CARE_PROVIDER_SITE_OTHER): Payer: BC Managed Care – PPO | Admitting: Internal Medicine

## 2021-10-07 ENCOUNTER — Encounter: Payer: Self-pay | Admitting: Internal Medicine

## 2021-10-07 VITALS — BP 118/80 | HR 95 | Ht 65.0 in | Wt 227.7 lb

## 2021-10-07 DIAGNOSIS — N938 Other specified abnormal uterine and vaginal bleeding: Secondary | ICD-10-CM | POA: Insufficient documentation

## 2021-10-07 HISTORY — DX: Other specified abnormal uterine and vaginal bleeding: N93.8

## 2021-10-07 MED ORDER — ESTRADIOL VALERATE-DIENOGEST 3/2-2/2-3/1 MG PO TABS: 1.0000 | ORAL_TABLET | Freq: Every day | ORAL | 1 refills | Status: DC

## 2021-10-07 NOTE — Assessment & Plan Note (Signed)
No periods in many years ?On OCP---but stopped 2016 or so when got pregnany ?Has been on methotrexate (not now)----caused premature ovarian failure ?But now bleeding again ?Thickened endometrial stripe but no masses. Did have past endometrial scraping due to abnormal areas ? ?Can't get in with gyn right away---will set up urgent referral ?Start high dose estrogen OCP till she can get in ?

## 2021-10-07 NOTE — Progress Notes (Signed)
? ?Subjective:  ? ? Patient ID: Bridget Maldonado, female    DOB: 1980-11-11, 41 y.o.   MRN: [id number] ? ?HPI ?Here for ER follow up ? ?Has had abnormal bleeding for 3 months ?Worsened and bleeding through her tampons and pants--so went to urgent care----but sent to ER ?Ultrasound done--enlarged endometrial stripe ?Couldn't find one ovary ? ?Trying to get in with [facility]yn--but told 6 weeks ?Still bleeding a lot---and had to miss work ? ?Current Outpatient Medications on File Prior to Visit  ?Medication Sig Dispense Refill  ? albuterol (PROVENTIL HFA;VENTOLIN HFA) 108 (90 Base) MCG/ACT inhaler Inhale 2 puffs into the lungs every 6 (six) hours as needed for wheezing or shortness of breath. 1 Inhaler 1  ? ALPRAZolam (XANAX) 0.25 MG tablet Take 1 tablet (0.25 mg total) by mouth 2 (two) times daily as needed for anxiety. 60 tablet 0  ? ARIPiprazole (ABILIFY) 5 MG tablet Take 1 tablet (5 mg total) by mouth at bedtime. 90 tablet 3  ? cetirizine (ZYRTEC) 10 MG tablet Take 10 mg by mouth daily.    ? DULoxetine (CYMBALTA) 30 MG capsule Take 1 capsule (30 mg total) by mouth daily. 30 capsule 3  ? DULoxetine (CYMBALTA) 60 MG capsule Take 1 capsule (60 mg total) by mouth daily. 90 capsule 3  ? Fluticasone-Salmeterol (ADVAIR) 250-50 MCG/DOSE AEPB Inhale 1 puff into the lungs 2 (two) times daily. 180 each 3  ? HYDROcodone-acetaminophen (NORCO) 7.5-325 MG tablet Take 0.5 tablets by mouth every 6 (six) hours as needed for moderate pain. 10 tablet 0  ? ibuprofen (ADVIL) 400 MG tablet Take 1 tablet (400 mg total) by mouth every 6 (six) hours as needed for up to 7 days for moderate pain. 28 tablet 0  ? mometasone (ELOCON) 0.1 % ointment APPLY TWICE DAILY AS NEEDED FOR PSORIASIS 45 g 1  ? montelukast (SINGULAIR) 10 MG tablet Take 1 tablet (10 mg total) by mouth at bedtime. 90 tablet 3  ? traMADol (ULTRAM) 50 MG tablet Take 1 tablet (50 mg total) by mouth 2 (two) times daily as needed. 30 tablet 0  ? losartan (COZAAR) 50 MG tablet  Take 1 tablet (50 mg total) by mouth daily. 90 tablet 3  ? ?No current facility-administered medications on file prior to visit.  ? ? ?Allergies  ?Allergen Reactions  ? Sulfa Antibiotics Nausea And Vomiting  ? Ciprofloxacin Nausea And Vomiting  ? Effexor [Venlafaxine Hcl] Other (See Comments)  ?  Reaction:  Made pt sleep for two days straight.  ? ? ?Past Medical History:  ?Diagnosis Date  ? Anxiety   ? Possible bipolar, Type II  ? Arthritis   ? Asthma   ? Bipolar disorder ([place])   ? BRCA negative 07/2017  ? vistaseq neg  ? Chronic headaches   ? Endometriosis 2018  ? Family history of breast cancer   ? Hemorrhoids 08/2007  ? IBS (irritable bowel syndrome) 08/2007  ? 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 Increased risk of breast cancer 07/2017  ? IBIS=40%  ? Kidney infection   ? LGSIL on Pap smear of cervix   ? Migraines   ? Multiple allergies   ? Obese   ? Psoriasis   ? Psoriatic arthritis ([place])   ? ? ?Past Surgical History:  ?Procedure Laterality Date  ? BREAST BIOPSY Left 8 years  ? CESAREAN SECTION    ? COLONOSCOPY    ? COLONOSCOPY W/ BIOPSIES    ? EAR TUBE REMOVAL    ?  ESOPHAGOGASTRODUODENOSCOPY    ? LAPAROSCOPIC CHOLECYSTECTOMY  6/09  ? Dr. Hulen Skains  ? TUBAL LIGATION  2017  ? TYMPANOSTOMY TUBE PLACEMENT    ? ? ?Family History  ?Problem Relation Age of Onset  ? Colon polyps Maternal Grandfather   ? Parkinson's disease Maternal Grandfather   ? Breast cancer Mother 34  ? Rheum arthritis Mother   ? Irritable bowel syndrome Mother   ? Asthma Father   ? Anxiety disorder Father   ?     Severe, possible bipolar  ? Irritable bowel syndrome Maternal Grandmother   ? Diabetes Other   ?     mat. great uncles  ? Alzheimer's disease Other   ?     Dad's side  ? Colon cancer Other   ?     mat GGF  ? ? ?Social History  ? ?Socioeconomic History  ? Marital status: Married  ?  Spouse name: Not on file  ? Number of children: 1  ? Years of education: Not on file  ? Highest education level: Not on file  ?Occupational History  ? Occupation:  Administration  ?  Employer: [employer]  ?Tobacco Use  ? Smoking status: Never  ?  Passive exposure: Past  ? Smokeless tobacco: Never  ?Vaping Use  ? Vaping Use: Never used  ?Substance and Sexual Activity  ? Alcohol use: No  ?  Alcohol/week: 0.0 standard drinks  ? Drug use: No  ? Sexual activity: Yes  ?  Birth control/protection: Surgical  ?Other Topics Concern  ? Not on file  ?Social History Narrative  ? Married 10/22  ? ?Social Determinants of Health  ? ?Financial Resource Strain: Not on file  ?Food Insecurity: Not on file  ?Transportation Needs: Not on file  ?Physical Activity: Not on file  ?Stress: Not on file  ?Social Connections: Not on file  ?Intimate Partner Violence: Not on file  ? ?Review of Systems ?Reviewed her genetic testing in 2019 and was BRCA negative (and everything else also) ?   ?Objective:  ? Physical Exam ?Neurological:  ?   Mental Status: She is alert.  ?  ? ? ? ? ?   ?Assessment & Plan:  ? ?

## 2021-10-08 ENCOUNTER — Telehealth: Payer: Self-pay | Admitting: Internal Medicine

## 2021-10-08 ENCOUNTER — Encounter: Payer: Self-pay | Admitting: *Deleted

## 2021-10-08 MED ORDER — DROSPIRENONE-ETHINYL ESTRADIOL 3-0.03 MG PO TABS: 1.0000 | ORAL_TABLET | Freq: Every day | ORAL | 11 refills | Status: DC

## 2021-10-08 NOTE — Telephone Encounter (Signed)
Pt called checking on the status of her referral to the Gynecology. Pt would like to know who you referred her too. Please advise. ?

## 2021-10-08 NOTE — Telephone Encounter (Signed)
Can you look into this for me.

## 2021-10-08 NOTE — Telephone Encounter (Signed)
Pt can't afford medication please advise  ?

## 2021-10-10 ENCOUNTER — Other Ambulatory Visit: Payer: Self-pay | Admitting: Internal Medicine

## 2021-10-10 MED ORDER — TRAMADOL HCL 50 MG PO TABS: 50.0000 mg | ORAL_TABLET | Freq: Two times a day (BID) | ORAL | 0 refills | Status: DC | PRN

## 2021-10-10 NOTE — Telephone Encounter (Signed)
Last filled on 09/11/21 # 30  ? ?Is this okay to refill? ?

## 2021-10-10 NOTE — Telephone Encounter (Signed)
THis has already been scheduled. ? ?Nothing further needed.  ? ?

## 2021-10-17 ENCOUNTER — Telehealth: Payer: Self-pay

## 2021-10-17 NOTE — Telephone Encounter (Signed)
Left message to see how pt was doing after ER visit 10-03-21. ASked her to call or send a MyChart message with an update. ?

## 2021-10-30 ENCOUNTER — Ambulatory Visit
Admission: RE | Admit: 2021-10-30 | Discharge: 2021-10-30 | Disposition: A | Discharge status: Home / Self Care | Payer: BC Managed Care – PPO | Source: Ambulatory Visit | Attending: Emergency Medicine | Admitting: Emergency Medicine

## 2021-10-30 ENCOUNTER — Encounter: Payer: Self-pay | Admitting: Internal Medicine

## 2021-10-30 VITALS — BP 148/87 | HR 93 | Temp 97.9°F | Resp 18

## 2021-10-30 DIAGNOSIS — J45901 Unspecified asthma with (acute) exacerbation: Secondary | ICD-10-CM

## 2021-10-30 DIAGNOSIS — R059 Cough, unspecified: Secondary | ICD-10-CM | POA: Diagnosis not present

## 2021-10-30 LAB — POCT RAPID STREP A (OFFICE): Rapid Strep A Screen: NEGATIVE

## 2021-10-30 MED ORDER — PROMETHAZINE-DM 6.25-15 MG/5ML PO SYRP: 5.0000 mL | ORAL_SOLUTION | Freq: Every evening | ORAL | 0 refills | Status: DC | PRN

## 2021-10-30 MED ORDER — BENZONATATE 200 MG PO CAPS: 200.0000 mg | ORAL_CAPSULE | Freq: Three times a day (TID) | ORAL | 0 refills | Status: DC | PRN

## 2021-10-30 MED ORDER — PREDNISONE 10 MG PO TABS: 40.0000 mg | ORAL_TABLET | Freq: Every day | ORAL | 0 refills | Status: AC

## 2021-10-30 NOTE — Discharge Instructions (Addendum)
Continue using your albuterol inhaler.  Take the prednisone as directed.   ? ?Take the Promethazine DM cough syrup as directed.  Do not drive, operate machinery, or drink alcohol with this medication as it may cause drowsiness.   ? ?Follow up with your primary care provider if your symptoms are not improving.   ? ?

## 2021-10-30 NOTE — ED Triage Notes (Signed)
Patient presents to Urgent Care with complaints of cough, nasal congestion since Monday. Worse at night. Daughter tested positive for strep. Treating with albuterol inhaler, tessalon pearls, and allergy meds.  ? ?Denies fever. ?

## 2021-10-30 NOTE — ED Provider Notes (Signed)
?[facility] ? ? ? ?CSN: [id number] ?Arrival date & time: 10/30/21  1525 ? ? ?  ? ?History   ?Chief Complaint ?Chief Complaint  ?Patient presents with  ? Cough  ?  Came back from out of town with congestion cough and runny/stuffy nose - Entered by patient  ? ? ?HPI ?Bridget Maldonado is a 41 y.o. female.  Patient presents with congestion and cough x2 days.  Her symptoms are worse at night.  She reports wheezing during the night.  She denies fever, rash, shortness of breath, vomiting, diarrhea, or other symptoms.  Treatment at home with albuterol inhaler, Tessalon Perles, allergy medication.  She recently returned from vacation in [place] and her daughter tested positive for strep.  Patient messaged her PCP today about her cough and was prescribed Tessalon Perles.  Her medical history includes asthma. ? ?The history is provided by the patient and medical records.  ? ?Past Medical History:  ?Diagnosis Date  ? Anxiety   ? Possible bipolar, Type II  ? Arthritis   ? Asthma   ? Bipolar disorder ([place])   ? BRCA negative 07/2017  ? vistaseq neg  ? Chronic headaches   ? Endometriosis 2018  ? Family history of breast cancer   ? Hemorrhoids 08/2007  ? IBS (irritable bowel syndrome) 08/2007  ? 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 Increased risk of breast cancer 07/2017  ? IBIS=40%  ? Kidney infection   ? LGSIL on Pap smear of cervix   ? Migraines   ? Multiple allergies   ? Obese   ? Psoriasis   ? Psoriatic arthritis ([place])   ? ? ?Patient Active Problem List  ? Diagnosis Date Noted  ? Dysfunctional uterine bleeding 10/07/2021  ? Preventative health care 06/13/2020  ? Bipolar disease, chronic ([place]) 07/27/2018  ? Family history of colon cancer 07/27/2017  ? 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  ? History of cervical dysplasia 07/16/2017  ? Family history of breast cancer 07/16/2017  ? Secondary oligomenorrhea 07/16/2017  ? History of endometriosis 07/16/2017  ? Psoriatic arthritis ([place]) 10/28/2016  ? Chronic low back pain  10/28/2016  ? Acute bronchitis 05/14/2015  ? Essential (primary) hypertension 10/18/2014  ? Arthritis 11/24/2013  ? Arthropathia 11/24/2013  ? Obstipation 04/12/2013  ? Migraine with status migrainosus 09/08/2012  ? 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 Mild persistent asthma 05/26/2011  ? Severe anxiety with panic 08/29/2006  ? Panic disorder without agoraphobia 08/29/2006  ? ? ?Past Surgical History:  ?Procedure Laterality Date  ? BREAST BIOPSY Left 8 years  ? CESAREAN SECTION    ? COLONOSCOPY    ? COLONOSCOPY W/ BIOPSIES    ? EAR TUBE REMOVAL    ? ESOPHAGOGASTRODUODENOSCOPY    ? LAPAROSCOPIC CHOLECYSTECTOMY  6/09  ? Dr. Hulen Skains  ? TUBAL LIGATION  2017  ? TYMPANOSTOMY TUBE PLACEMENT    ? ? ?OB History   ? ? Gravida  ?2  ? Para  ?1  ? Term  ?1  ? Preterm  ?   ? AB  ?1  ? Living  ?1  ?  ? ? SAB  ?   ? IAB  ?1  ? Ectopic  ?   ? Multiple  ?   ? Live Births  ?   ?   ?  ? Obstetric Comments  ?Menstrual age: 14 ? ?Age 1st Pregnancy: 64 ?  ?  ? ?  ? ? ? ?Home Medications   ? ?Prior to  Admission medications   ?Medication Sig Start Date End Date Taking? Authorizing Provider  ?predniSONE (DELTASONE) 10 MG tablet Take 4 tablets (40 mg total) by mouth daily for 5 days. 10/30/21 11/04/21 Yes Sharion Balloon, NP  ?promethazine-dextromethorphan (PROMETHAZINE-DM) 6.25-15 MG/5ML syrup Take 5 mLs by mouth at bedtime as needed for cough. 10/30/21  Yes Sharion Balloon, NP  ?albuterol (PROVENTIL HFA;VENTOLIN HFA) 108 (90 Base) MCG/ACT inhaler Inhale 2 puffs into the lungs every 6 (six) hours as needed for wheezing or shortness of breath. 04/16/16   Venia Carbon, MD  ?ALPRAZolam Duanne Moron) 0.25 MG tablet Take 1 tablet (0.25 mg total) by mouth 2 (two) times daily as needed for anxiety. 08/20/21   Venia Carbon, MD  ?ARIPiprazole (ABILIFY) 5 MG tablet Take 1 tablet (5 mg total) by mouth at bedtime. 06/13/20   Venia Carbon, MD  ?benzonatate (TESSALON) 200 MG capsule Take 1 capsule (200 mg total) by mouth 3  (three) times daily as needed for cough. 10/30/21   Venia Carbon, MD  ?cetirizine (ZYRTEC) 10 MG tablet Take 10 mg by mouth daily.    [provider]  ?drospirenone-ethinyl estradiol (YASMIN) 3-0.03 MG tablet Take 1 tablet by mouth daily. 10/08/21   Venia Carbon, MD  ?DULoxetine (CYMBALTA) 30 MG capsule Take 1 capsule (30 mg total) by mouth daily. 05/09/21   Venia Carbon, MD  ?DULoxetine (CYMBALTA) 60 MG capsule Take 1 capsule (60 mg total) by mouth daily. 06/13/20   Venia Carbon, MD  ?Fluticasone-Salmeterol (ADVAIR) 250-50 MCG/DOSE AEPB Inhale 1 puff into the lungs 2 (two) times daily. 06/13/20   Venia Carbon, MD  ?HYDROcodone-acetaminophen (NORCO) 7.5-325 MG tablet Take 0.5 tablets by mouth every 6 (six) hours as needed for moderate pain. 10/03/21   Duanne Guess, PA-C  ?losartan (COZAAR) 50 MG tablet Take 1 tablet (50 mg total) by mouth daily. 06/13/20 12/10/20  Viviana Simpler I, MD  ?mometasone (ELOCON) 0.1 % ointment APPLY TWICE DAILY AS NEEDED FOR PSORIASIS 10/15/18   Venia Carbon, MD  ?montelukast (SINGULAIR) 10 MG tablet Take 1 tablet (10 mg total) by mouth at bedtime. 09/11/21   Venia Carbon, MD  ?Wyona Almas 3/2-2/2-3/1 MG tablet TAKE 1 TABLET BY MOUTH ONCE A DAY 10/08/21   Venia Carbon, MD  ?traMADol (ULTRAM) 50 MG tablet Take 1 tablet (50 mg total) by mouth 2 (two) times daily as needed. 10/10/21   Venia Carbon, MD  ? ? ?Family History ?Family History  ?Problem Relation Age of Onset  ? Colon polyps Maternal Grandfather   ? Parkinson's disease Maternal Grandfather   ? Breast cancer Mother 36  ? Rheum arthritis Mother   ? Irritable bowel syndrome Mother   ? Asthma Father   ? Anxiety disorder Father   ?     Severe, possible bipolar  ? Irritable bowel syndrome Maternal Grandmother   ? Diabetes Other   ?     mat. great uncles  ? Alzheimer's disease Other   ?     Dad's side  ? Colon cancer Other   ?     mat GGF  ? ? ?Social History ?Social History  ? ?Tobacco Use   ? Smoking status: Never  ?  Passive exposure: Past  ? Smokeless tobacco: Never  ?Vaping Use  ? Vaping Use: Never used  ?Substance Use Topics  ? Alcohol use: No  ?  Alcohol/week: 0.0 standard drinks  ? Drug use: No  ? ? ? ?  Allergies   ?Sulfa antibiotics, Ciprofloxacin, and Effexor [venlafaxine hcl] ? ? ?Review of Systems ?Review of Systems  ?Constitutional:  Negative for chills and fever.  ?HENT:  Positive for congestion. Negative for ear pain and sore throat.   ?Respiratory:  Positive for cough and wheezing. Negative for shortness of breath.   ?Cardiovascular:  Negative for chest pain and palpitations.  ?Gastrointestinal:  Negative for diarrhea and vomiting.  ?Skin:  Negative for color change and rash.  ?All other systems reviewed and are negative. ? ? ?Physical Exam ?Triage Vital Signs ?ED Triage Vitals  ?Enc Vitals Group  ?   BP   ?   Pulse   ?   Resp   ?   Temp   ?   Temp src   ?   SpO2   ?   Weight   ?   Height   ?   Head Circumference   ?   Peak Flow   ?   Pain Score   ?   Pain Loc   ?   Pain Edu?   ?   Excl. in [place]?   ? ?No data found. ? ?Updated Vital Signs ?BP (!) 148/87   Pulse 93   Temp 97.9 ?F (36.6 ?C)   Resp 18   LMP 10/03/2021 Comment: signed preg waiver  SpO2 98%  ? ?Visual Acuity ?Right Eye Distance:   ?Left Eye Distance:   ?Bilateral Distance:   ? ?Right Eye Near:   ?Left Eye Near:    ?Bilateral Near:    ? ?Physical Exam ?Vitals and nursing note reviewed.  ?Constitutional:   ?   General: She is not in acute distress. ?   Appearance: She is well-developed. She is obese. She is not ill-appearing.  ?HENT:  ?   Right Ear: Tympanic membrane normal.  ?   Left Ear: Tympanic membrane normal.  ?   Nose: Nose normal.  ?   Mouth/Throat:  ?   Mouth: Mucous membranes are moist.  ?   Pharynx: Oropharynx is clear.  ?Cardiovascular:  ?   Rate and Rhythm: Normal rate and regular rhythm.  ?   Heart sounds: Normal heart sounds.  ?Pulmonary:  ?   Effort: Pulmonary effort is normal. No respiratory distress.  ?    Breath sounds: Normal breath sounds. No wheezing.  ?Musculoskeletal:  ?   Cervical back: Neck supple.  ?Skin: ?   General: Skin is warm and dry.  ?Neurological:  ?   Mental Status: She is alert.  ?Psychiatric:     ?

## 2021-11-04 DIAGNOSIS — N939 Abnormal uterine and vaginal bleeding, unspecified: Secondary | ICD-10-CM | POA: Diagnosis not present

## 2021-11-05 ENCOUNTER — Encounter: Payer: Self-pay | Admitting: Obstetrics and Gynecology

## 2021-11-05 ENCOUNTER — Other Ambulatory Visit: Payer: Self-pay | Admitting: Internal Medicine

## 2021-11-05 MED ORDER — TRAMADOL HCL 50 MG PO TABS: 50.0000 mg | ORAL_TABLET | Freq: Two times a day (BID) | ORAL | 0 refills | Status: DC | PRN

## 2021-11-05 NOTE — Telephone Encounter (Signed)
Last filled 10-10-21 #30 ?Last OV 10-07-21 ?Next OV 09-17-22 ?[facility] ?

## 2021-11-07 ENCOUNTER — Other Ambulatory Visit: Payer: Self-pay | Admitting: Internal Medicine

## 2021-11-07 MED ORDER — ALPRAZOLAM 0.25 MG PO TABS: 0.2500 mg | ORAL_TABLET | Freq: Two times a day (BID) | ORAL | 0 refills | Status: DC | PRN

## 2021-11-07 NOTE — Telephone Encounter (Signed)
Last filled 08-20-21 #60 Last OV 10-07-21 Next OV 09-17-22 [place]

## 2021-11-13 ENCOUNTER — Other Ambulatory Visit: Payer: Self-pay

## 2021-11-13 ENCOUNTER — Emergency Department (HOSPITAL_BASED_OUTPATIENT_CLINIC_OR_DEPARTMENT_OTHER)
Admission: EM | Admit: 2021-11-13 | Discharge: 2021-11-14 | Disposition: A | Discharge status: Home / Self Care | Payer: BC Managed Care – PPO | Attending: Emergency Medicine | Admitting: Emergency Medicine

## 2021-11-13 ENCOUNTER — Encounter: Payer: Self-pay | Admitting: Internal Medicine

## 2021-11-13 DIAGNOSIS — J45909 Unspecified asthma, uncomplicated: Secondary | ICD-10-CM | POA: Insufficient documentation

## 2021-11-13 DIAGNOSIS — R1030 Lower abdominal pain, unspecified: Secondary | ICD-10-CM | POA: Diagnosis not present

## 2021-11-13 DIAGNOSIS — N939 Abnormal uterine and vaginal bleeding, unspecified: Secondary | ICD-10-CM | POA: Diagnosis not present

## 2021-11-13 DIAGNOSIS — I10 Essential (primary) hypertension: Secondary | ICD-10-CM | POA: Insufficient documentation

## 2021-11-13 LAB — URINALYSIS, ROUTINE W REFLEX MICROSCOPIC
Bilirubin Urine: NEGATIVE
Glucose, UA: NEGATIVE mg/dL
Nitrite: NEGATIVE
Protein, ur: 100 mg/dL — AB
RBC / HPF: >50 RBC/hpf — ABNORMAL HIGH (ref 0–5)
Specific Gravity, Urine: 1.031 — ABNORMAL HIGH (ref 1.005–1.030)
WBC, UA: >50 WBC/hpf — ABNORMAL HIGH (ref 0–5)
pH: 5.5 (ref 5.0–8.0)

## 2021-11-13 LAB — WET PREP, GENITAL
Clue Cells Wet Prep HPF POC: NONE SEEN
Sperm: NONE SEEN
Trich, Wet Prep: NONE SEEN
WBC, Wet Prep HPF POC: <10 (ref <10)
Yeast Wet Prep HPF POC: NONE SEEN

## 2021-11-13 LAB — CBC
HCT: 39 % (ref 36.0–46.0)
Hemoglobin: 12.8 g/dL (ref 12.0–15.0)
MCH: 28.2 pg (ref 26.0–34.0)
MCHC: 32.8 g/dL (ref 30.0–36.0)
MCV: 85.9 fL (ref 80.0–100.0)
Platelets: 310 10*3/uL (ref 150–400)
RBC: 4.54 MIL/uL (ref 3.87–5.11)
RDW: 13.1 % (ref 11.5–15.5)
WBC: 8.7 10*3/uL (ref 4.0–10.5)
nRBC: 0 % (ref 0.0–0.2)

## 2021-11-13 LAB — BASIC METABOLIC PANEL
Anion gap: 11 (ref 5–15)
BUN: 13 mg/dL (ref 6–20)
CO2: 23 mmol/L (ref 22–32)
Calcium: 9.4 mg/dL (ref 8.9–10.3)
Chloride: 103 mmol/L (ref 98–111)
Creatinine, Ser: 0.85 mg/dL (ref 0.44–1.00)
GFR, Estimated: >60 mL/min (ref >60)
Glucose, Bld: 94 mg/dL (ref 70–99)
Potassium: 3.5 mmol/L (ref 3.5–5.1)
Sodium: 137 mmol/L (ref 135–145)

## 2021-11-13 LAB — PREGNANCY, URINE: Preg Test, Ur: NEGATIVE

## 2021-11-13 MED ORDER — MORPHINE SULFATE (PF) 4 MG/ML IV SOLN
4.0000 mg | Freq: Once | INTRAVENOUS | Status: AC
  Administered 2021-11-13: 4 mg via INTRAVENOUS
  Filled 2021-11-13: qty 1

## 2021-11-13 NOTE — Telephone Encounter (Signed)
Spoke to pt. Advised her that she would need an OV for labs to be done. She cannot come in until after work. Her GYN advised she go to the ER. I suggested the same because they can do imaging there as well as labs. She will go after work today.

## 2021-11-13 NOTE — ED Provider Notes (Signed)
[facility] Provider Note   CSN: [id number] Arrival date & time: 11/13/21  1549     History  Chief Complaint  Patient presents with   Vaginal Bleeding    Bridget Maldonado is a 41 y.o. female.  The history is provided by the patient and medical records. No language interpreter was used.  Vaginal Bleeding Quality:  Bright red, dark red, clots and heavier than menses Severity:  Severe Onset quality:  Gradual Duration:  5 months (worsened in last 2 weeks) Chronicity:  New Menstrual history:  Postmenopausal Relieved by:  Nothing Worsened by:  Nothing Ineffective treatments:  None tried Associated symptoms: abdominal pain and back pain   Associated symptoms: no dysuria, no fatigue, no fever, no nausea and no vaginal discharge   Risk factors: no bleeding disorder       Home Medications Prior to Admission medications   Medication Sig Start Date End Date Taking? Authorizing Provider  albuterol (PROVENTIL HFA;VENTOLIN HFA) 108 (90 Base) MCG/ACT inhaler Inhale 2 puffs into the lungs every 6 (six) hours as needed for wheezing or shortness of breath. 04/16/16   Venia Carbon, MD  ALPRAZolam Duanne Moron) 0.25 MG tablet Take 1 tablet (0.25 mg total) by mouth 2 (two) times daily as needed for anxiety. 11/07/21   Venia Carbon, MD  ARIPiprazole (ABILIFY) 5 MG tablet Take 1 tablet (5 mg total) by mouth at bedtime. 06/13/20   Venia Carbon, MD  benzonatate (TESSALON) 200 MG capsule Take 1 capsule (200 mg total) by mouth 3 (three) times daily as needed for cough. 10/30/21   Venia Carbon, MD  cetirizine (ZYRTEC) 10 MG tablet Take 10 mg by mouth daily.    [provider]  drospirenone-ethinyl estradiol (YASMIN) 3-0.03 MG tablet Take 1 tablet by mouth daily. 10/08/21   Venia Carbon, MD  DULoxetine (CYMBALTA) 30 MG capsule Take 1 capsule (30 mg total) by mouth daily. 05/09/21   Venia Carbon, MD  DULoxetine (CYMBALTA) 60 MG capsule Take 1  capsule (60 mg total) by mouth daily. 06/13/20   Venia Carbon, MD  Fluticasone-Salmeterol (ADVAIR) 250-50 MCG/DOSE AEPB Inhale 1 puff into the lungs 2 (two) times daily. 06/13/20   Venia Carbon, MD  HYDROcodone-acetaminophen (NORCO) 7.5-325 MG tablet Take 0.5 tablets by mouth every 6 (six) hours as needed for moderate pain. 10/03/21   Duanne Guess, PA-C  losartan (COZAAR) 50 MG tablet Take 1 tablet (50 mg total) by mouth daily. 06/13/20 12/10/20  Venia Carbon, MD  mometasone (ELOCON) 0.1 % ointment APPLY TWICE DAILY AS NEEDED FOR PSORIASIS 10/15/18   Venia Carbon, MD  montelukast (SINGULAIR) 10 MG tablet Take 1 tablet (10 mg total) by mouth at bedtime. 09/11/21   Venia Carbon, MD  NATAZIA 3/2-2/2-3/1 MG tablet TAKE 1 TABLET BY MOUTH ONCE A DAY 10/08/21   Venia Carbon, MD  promethazine-dextromethorphan (PROMETHAZINE-DM) 6.25-15 MG/5ML syrup Take 5 mLs by mouth at bedtime as needed for cough. 10/30/21   Sharion Balloon, NP  traMADol (ULTRAM) 50 MG tablet Take 1 tablet (50 mg total) by mouth 2 (two) times daily as needed. 11/05/21   Venia Carbon, MD      Allergies    Sulfa antibiotics, Ciprofloxacin, and Effexor [venlafaxine hcl]    Review of Systems   Review of Systems  Constitutional:  Negative for chills, fatigue and fever.  HENT:  Negative for congestion.   Respiratory:  Negative for cough, chest tightness, shortness  of breath and wheezing.   Gastrointestinal:  Positive for abdominal pain. Negative for constipation, diarrhea, nausea and vomiting.  Genitourinary:  Positive for pelvic pain and vaginal bleeding. Negative for dysuria, flank pain, frequency, vaginal discharge and vaginal pain.  Musculoskeletal:  Positive for back pain. Negative for neck pain and neck stiffness.  Skin:  Negative for rash and wound.  Neurological:  Negative for headaches.  Psychiatric/Behavioral:  Negative for agitation.    Physical Exam Updated Vital Signs BP (!) 151/107 (BP  Location: Right Arm)   Pulse 99   Temp 98.5 F (36.9 C)   Resp 18   Ht '5\' 5"'$  (1.651 m)   Wt 99.8 kg   SpO2 100%   BMI 36.61 kg/m  Physical Exam Vitals and nursing note reviewed. Exam conducted with a chaperone present.  Constitutional:      General: She is not in acute distress.    Appearance: She is well-developed. She is not ill-appearing, toxic-appearing or diaphoretic.  HENT:     Head: Normocephalic and atraumatic.     Nose: No congestion.     Mouth/Throat:     Mouth: Mucous membranes are moist.     Pharynx: No oropharyngeal exudate or posterior oropharyngeal erythema.  Eyes:     Extraocular Movements: Extraocular movements intact.     Conjunctiva/sclera: Conjunctivae normal.     Pupils: Pupils are equal, round, and reactive to light.  Cardiovascular:     Rate and Rhythm: Normal rate and regular rhythm.     Heart sounds: No murmur heard. Pulmonary:     Effort: Pulmonary effort is normal. No respiratory distress.     Breath sounds: Normal breath sounds. No wheezing, rhonchi or rales.  Chest:     Chest wall: No tenderness.  Abdominal:     General: Abdomen is flat.     Palpations: Abdomen is soft.     Tenderness: There is no abdominal tenderness. There is no right CVA tenderness, left CVA tenderness, guarding or rebound.  Genitourinary:    Vagina: No vaginal discharge.     Cervix: Cervical bleeding present. No cervical motion tenderness.     Adnexa:        Right: Tenderness present.        Left: Tenderness present.   Musculoskeletal:        General: No swelling.     Cervical back: Neck supple.  Skin:    General: Skin is warm and dry.     Capillary Refill: Capillary refill takes less than 2 seconds.     Findings: No erythema.  Neurological:     General: No focal deficit present.     Mental Status: She is alert.  Psychiatric:        Mood and Affect: Mood normal.    ED Results / Procedures / Treatments   Labs (all labs ordered are listed, but only abnormal  results are displayed) Labs Reviewed  URINALYSIS, ROUTINE W REFLEX MICROSCOPIC - Abnormal; Notable for the following components:      Result Value   Color, Urine ORANGE (*)    APPearance CLOUDY (*)    Specific Gravity, Urine 1.031 (*)    Hgb urine dipstick LARGE (*)    Ketones, ur TRACE (*)    Protein, ur 100 (*)    Leukocytes,Ua SMALL (*)    RBC / HPF >50 (*)    WBC, UA >50 (*)    Bacteria, UA FEW (*)    All other components within normal limits  WET PREP, GENITAL  URINE CULTURE  PREGNANCY, URINE  CBC  BASIC METABOLIC PANEL  GC/CHLAMYDIA PROBE AMP ([place]) NOT AT [facility]    EKG None  Radiology No results found.  Procedures Procedures    Medications Ordered in ED Medications  morphine (PF) 4 MG/ML injection 4 mg (4 mg Intravenous Given 11/13/21 2245)    ED Course/ Medical Decision Making/ A&P                           Medical Decision Making Amount and/or Complexity of Data Reviewed Labs: ordered. Radiology: ordered.  Risk Prescription drug management.    Bridget Maldonado is a 41 y.o. female with a past medical history significant for anxiety, asthma, IBS, hypertension, endometriosis, and dysfunctional uterine bleeding who presents with severe abdominal pain and continued vaginal bleeding.  Patient reports that she is postmenopausal after having methotrexate in the past.  She said that since January she has been having vaginal bleeding and*developing more pain over the last few weeks.  She reports that she went to [facility] 2 months ago and was diagnosed with endometrial hyperplasia and was started on birth control to help try to control symptoms.  She was post follow-up with OB/GYN and said that the earliest she could be seen was in July.  She is still waiting for that appointment.  She says that over the last 2 weeks though bleeding has been heavier and she is passing large clots frequently.  She reports is having to change a pad almost every 30 minutes  at times.  She reports the pain is severe across her lower abdomen.  She is otherwise denies significant nausea, vomiting, chest pain, shortness of breath.  Denies any constipation, diarrhea, or rectal bleeding.  Denies any vaginal discharge.  Denies other complaints.  On exam, lungs were clear and chest was nontender.  Abdomen was nontender externally but she is reporting pain in her lower abdomen and pelvis area.  A pelvic exam was performed with a chaperone and she does have some blood clots coming from her cervical os but did not have any cervical motion tenderness.  No discharge seen.  She did have tenderness in her bilateral adnexa.  Patient had work-up started to rule out symptomatic anemia from was bleeding and her blood counts were reassuring.  She is not anemic today.  Due to the tenderness in her adnexa and the continued vaginal bleeding that is worsened, we will get a pelvic ultrasound to rule out other abnormality.  Patient's urinalysis did not show nitrites and given her lack of any urinary symptoms have low suspicion for UTI.  We will send a urine culture however given the leukocytes and bacteria.  Her wet prep was reassuring and the GC/committee a test will start the processing.  She is not pregnant.  BMP and CBC reassuring.  Patient will be reassessed after the ultrasound.  As she is not anemic today, I suspect her body has compensated for the months of bleeding and she is likely going to be stable for discharge home however, we will likely consult OB/GYN to both ensure close follow-up as well as see if she needs any changes to the medications to help stop her bleeding.  She reports that she is on the Yasmin.           Final Clinical Impression(s) / ED Diagnoses Final diagnoses:  Lower abdominal pain  Vaginal bleeding    Clinical Impression:  1. Lower abdominal pain   2. Vaginal bleeding     Disposition: Care transferred to oncoming team to await results of pelvic  ultrasound.  Anticipate discharge home with close OB/GYN follow-up.  This note was prepared with assistance of Systems analyst. Occasional wrong-word or sound-a-like substitutions may have occurred due to the inherent limitations of voice recognition software.      Ota Ebersole, Gwenyth Allegra, MD 11/14/21 [phone]

## 2021-11-13 NOTE — ED Triage Notes (Signed)
Pt arrived POV c/o vaginal bleeding since January. Pt reports heavier bleeding x 2 weeks and started passing clots 2 days ago. Was seen at [facility] for same about 2 months and was diagnosed with Endometrial Hyperplasia and was advised to f/u with Obgyn. Appt scheduled in July. Pt states she went through menopause 2 years ago.   Pt ao x 4, NAD noted in triage.

## 2021-11-13 NOTE — ED Provider Notes (Incomplete)
[facility] EMERGENCY DEPT Provider Note   CSN: [id number] Arrival date & time: 11/13/21  1549     History {Add pertinent medical, surgical, social history, OB history to HPI:1} Chief Complaint  Patient presents with  . Vaginal Bleeding    Bridget Maldonado is a 41 y.o. female.   Vaginal Bleeding     Home Medications Prior to Admission medications   Medication Sig Start Date End Date Taking? Authorizing Provider  albuterol (PROVENTIL HFA;VENTOLIN HFA) 108 (90 Base) MCG/ACT inhaler Inhale 2 puffs into the lungs every 6 (six) hours as needed for wheezing or shortness of breath. 04/16/16   Venia Carbon, MD  ALPRAZolam Duanne Moron) 0.25 MG tablet Take 1 tablet (0.25 mg total) by mouth 2 (two) times daily as needed for anxiety. 11/07/21   Venia Carbon, MD  ARIPiprazole (ABILIFY) 5 MG tablet Take 1 tablet (5 mg total) by mouth at bedtime. 06/13/20   Venia Carbon, MD  benzonatate (TESSALON) 200 MG capsule Take 1 capsule (200 mg total) by mouth 3 (three) times daily as needed for cough. 10/30/21   Venia Carbon, MD  cetirizine (ZYRTEC) 10 MG tablet Take 10 mg by mouth daily.    [provider]  drospirenone-ethinyl estradiol (YASMIN) 3-0.03 MG tablet Take 1 tablet by mouth daily. 10/08/21   Venia Carbon, MD  DULoxetine (CYMBALTA) 30 MG capsule Take 1 capsule (30 mg total) by mouth daily. 05/09/21   Venia Carbon, MD  DULoxetine (CYMBALTA) 60 MG capsule Take 1 capsule (60 mg total) by mouth daily. 06/13/20   Venia Carbon, MD  Fluticasone-Salmeterol (ADVAIR) 250-50 MCG/DOSE AEPB Inhale 1 puff into the lungs 2 (two) times daily. 06/13/20   Venia Carbon, MD  HYDROcodone-acetaminophen (NORCO) 7.5-325 MG tablet Take 0.5 tablets by mouth every 6 (six) hours as needed for moderate pain. 10/03/21   Duanne Guess, PA-C  losartan (COZAAR) 50 MG tablet Take 1 tablet (50 mg total) by mouth daily. 06/13/20 12/10/20  Venia Carbon, MD  mometasone  (ELOCON) 0.1 % ointment APPLY TWICE DAILY AS NEEDED FOR PSORIASIS 10/15/18   Venia Carbon, MD  montelukast (SINGULAIR) 10 MG tablet Take 1 tablet (10 mg total) by mouth at bedtime. 09/11/21   Venia Carbon, MD  NATAZIA 3/2-2/2-3/1 MG tablet TAKE 1 TABLET BY MOUTH ONCE A DAY 10/08/21   Venia Carbon, MD  promethazine-dextromethorphan (PROMETHAZINE-DM) 6.25-15 MG/5ML syrup Take 5 mLs by mouth at bedtime as needed for cough. 10/30/21   Sharion Balloon, NP  traMADol (ULTRAM) 50 MG tablet Take 1 tablet (50 mg total) by mouth 2 (two) times daily as needed. 11/05/21   Venia Carbon, MD      Allergies    Sulfa antibiotics, Ciprofloxacin, and Effexor [venlafaxine hcl]    Review of Systems   Review of Systems  Genitourinary:  Positive for vaginal bleeding.   Physical Exam Updated Vital Signs BP (!) 151/107 (BP Location: Right Arm)   Pulse 99   Temp 98.5 F (36.9 C)   Resp 18   Ht '5\' 5"'$  (1.651 m)   Wt 99.8 kg   SpO2 100%   BMI 36.61 kg/m  Physical Exam  ED Results / Procedures / Treatments   Labs (all labs ordered are listed, but only abnormal results are displayed) Labs Reviewed  URINALYSIS, ROUTINE W REFLEX MICROSCOPIC - Abnormal; Notable for the following components:      Result Value   Color, Urine ORANGE (*)  APPearance CLOUDY (*)    Specific Gravity, Urine 1.031 (*)    Hgb urine dipstick LARGE (*)    Ketones, ur TRACE (*)    Protein, ur 100 (*)    Leukocytes,Ua SMALL (*)    RBC / HPF >50 (*)    WBC, UA >50 (*)    Bacteria, UA FEW (*)    All other components within normal limits  WET PREP, GENITAL  URINE CULTURE  PREGNANCY, URINE  CBC  BASIC METABOLIC PANEL  GC/CHLAMYDIA PROBE AMP ([place]) NOT AT [facility]    EKG None  Radiology No results found.  Procedures Procedures  {Document cardiac monitor, telemetry assessment procedure when appropriate:1}  Medications Ordered in ED Medications  morphine (PF) 4 MG/ML injection 4 mg (4 mg Intravenous Given  11/13/21 2245)    ED Course/ Medical Decision Making/ A&P                           Medical Decision Making Amount and/or Complexity of Data Reviewed Labs: ordered. Radiology: ordered.  Risk Prescription drug management.    Bridget Maldonado is a 41 y.o. female with a past medical history significant for anxiety, asthma, IBS, hypertension, endometriosis, and dysfunctional uterine bleeding who presents with severe abdominal pain and continued vaginal bleeding.  Patient reports that she is postmenopausal after having methotrexate in the past.  She said that since January she has been having vaginal bleeding and*developing more pain over the last few weeks.  She reports that she went to  regional 2 months ago and was diagnosed with endometrial hyperplasia and was started on birth control to help try to control symptoms.  She was post follow-up with OB/GYN and said that the earliest she could be seen was in July.  She is still waiting for that appointment.  She says that over the last 2 weeks though bleeding has been heavier and she is passing large clots frequently.  She reports is having to change a pad almost every 30 minutes at times.  She reports the pain is severe across her lower abdomen.  She is otherwise denies significant nausea, vomiting, chest pain, shortness of breath.  Denies any constipation, diarrhea, or rectal bleeding.  Denies any vaginal discharge.  Denies other complaints.  On exam, lungs were clear and chest was nontender.  Abdomen was nontender externally but she is reporting pain in her lower abdomen and pelvis area.  A pelvic exam was performed with a chaperone and she does have some blood clots coming from her cervical os but did not have any cervical motion tenderness.  No discharge seen.  She did have tenderness in her bilateral adnexa.  Patient had work-up started to rule out symptomatic anemia from was bleeding and her blood counts were reassuring.  She is not  anemic today.  Due to the tenderness in her adnexa and the continued vaginal bleeding that is worsened, we will get a pelvic ultrasound to rule out other abnormality.  Patient's urinalysis did not show nitrites and given her lack of any urinary symptoms have low suspicion for UTI.  We will send a urine culture however given the leukocytes and bacteria.  Her wet prep was reassuring and the GC/committee a test will start the processing.  She is not pregnant.  BMP and CBC reassuring.  Patient will be reassessed after the ultrasound.  As she is not anemic today, I suspect her body has compensated for the months of  bleeding and she is likely going to be stable for discharge home however, we will likely consult OB/GYN to both ensure close follow-up as well as see if she needs any changes to the medications to help stop her bleeding.  She reports that she is on the Yasmin   {Document critical care time when appropriate:1} {Document review of labs and clinical decision tools ie heart score, Chads2Vasc2 etc:1}  {Document your independent review of radiology images, and any outside records:1} {Document your discussion with family members, caretakers, and with consultants:1} {Document social determinants of health affecting pt's care:1} {Document your decision making why or why not admission, treatments were needed:1} Final Clinical Impression(s) / ED Diagnoses Final diagnoses:  None    Rx / DC Orders ED Discharge Orders     None

## 2021-11-14 ENCOUNTER — Emergency Department (HOSPITAL_BASED_OUTPATIENT_CLINIC_OR_DEPARTMENT_OTHER): Payer: BC Managed Care – PPO

## 2021-11-14 DIAGNOSIS — N8500 Endometrial hyperplasia, unspecified: Secondary | ICD-10-CM | POA: Diagnosis not present

## 2021-11-14 DIAGNOSIS — N939 Abnormal uterine and vaginal bleeding, unspecified: Secondary | ICD-10-CM | POA: Diagnosis not present

## 2021-11-14 LAB — GC/CHLAMYDIA PROBE AMP (~~LOC~~) NOT AT ARMC
Chlamydia: NEGATIVE
Comment: NEGATIVE
Comment: NORMAL
Neisseria Gonorrhea: NEGATIVE

## 2021-11-14 MED ORDER — MEGESTROL ACETATE 40 MG PO TABS: ORAL_TABLET | ORAL | 0 refills | Status: DC

## 2021-11-14 NOTE — ED Notes (Signed)
Pt A&OX4 ambulatory at d/c with independent steady gait. Pt verbalized understanding of d/c instructions, prescription and follow up care. 

## 2021-11-14 NOTE — ED Provider Notes (Signed)
Nursing notes and vitals signs, including pulse oximetry, reviewed.  Summary of this visit's results, reviewed by myself:  EKG:  EKG Interpretation  Date/Time:    Ventricular Rate:    PR Interval:    QRS Duration:   QT Interval:    QTC Calculation:   R Axis:     Text Interpretation:          Labs:  Results for orders placed or performed during the hospital encounter of 11/13/21 (from the past 24 hour(s))  CBC     Status: None   Collection Time: 11/13/21  8:15 PM  Result Value Ref Range   WBC 8.7 4.0 - 10.5 K/uL   RBC 4.54 3.87 - 5.11 MIL/uL   Hemoglobin 12.8 12.0 - 15.0 g/dL   HCT 39.0 36.0 - 46.0 %   MCV 85.9 80.0 - 100.0 fL   MCH 28.2 26.0 - 34.0 pg   MCHC 32.8 30.0 - 36.0 g/dL   RDW 13.1 11.5 - 15.5 %   Platelets 310 150 - 400 K/uL   nRBC 0.0 0.0 - 0.2 %  Basic metabolic panel     Status: None   Collection Time: 11/13/21  8:15 PM  Result Value Ref Range   Sodium 137 135 - 145 mmol/L   Potassium 3.5 3.5 - 5.1 mmol/L   Chloride 103 98 - 111 mmol/L   CO2 23 22 - 32 mmol/L   Glucose, Bld 94 70 - 99 mg/dL   BUN 13 6 - 20 mg/dL   Creatinine, Ser 0.85 0.44 - 1.00 mg/dL   Calcium 9.4 8.9 - 10.3 mg/dL   GFR, Estimated >60 >60 mL/min   Anion gap 11 5 - 15  Pregnancy, urine     Status: None   Collection Time: 11/13/21 10:16 PM  Result Value Ref Range   Preg Test, Ur NEGATIVE NEGATIVE  Urinalysis, Routine w reflex microscopic Urine, Clean Catch     Status: Abnormal   Collection Time: 11/13/21 10:16 PM  Result Value Ref Range   Color, Urine ORANGE (A) YELLOW   APPearance CLOUDY (A) CLEAR   Specific Gravity, Urine 1.031 (H) 1.005 - 1.030   pH 5.5 5.0 - 8.0   Glucose, UA NEGATIVE NEGATIVE mg/dL   Hgb urine dipstick LARGE (A) NEGATIVE   Bilirubin Urine NEGATIVE NEGATIVE   Ketones, ur TRACE (A) NEGATIVE mg/dL   Protein, ur 100 (A) NEGATIVE mg/dL   Nitrite NEGATIVE NEGATIVE   Leukocytes,Ua SMALL (A) NEGATIVE   RBC / HPF >50 (H) 0 - 5 RBC/hpf   WBC, UA >50 (H) 0 - 5  WBC/hpf   Bacteria, UA FEW (A) NONE SEEN   Squamous Epithelial / LPF 6-10 0 - 5   Mucus PRESENT   Wet prep, genital     Status: None   Collection Time: 11/13/21 10:56 PM   Specimen: Urine, Clean Catch  Result Value Ref Range   Yeast Wet Prep HPF POC NONE SEEN NONE SEEN   Trich, Wet Prep NONE SEEN NONE SEEN   Clue Cells Wet Prep HPF POC NONE SEEN NONE SEEN   WBC, Wet Prep HPF POC <10 <10   Sperm NONE SEEN     Imaging Studies: US PELVIC COMPLETE W TRANSVAGINAL AND TORSION R/O  Result Date: 11/14/2021 CLINICAL DATA:  Pelvic pain and heavy vaginal bleeding x5 months. EXAM: TRANSABDOMINAL AND TRANSVAGINAL ULTRASOUND OF PELVIS DOPPLER ULTRASOUND OF OVARIES TECHNIQUE: Both transabdominal and transvaginal ultrasound examinations of the pelvis were performed. Transabdominal technique was performed  for global imaging of the pelvis including uterus, ovaries, adnexal regions, and pelvic cul-de-sac. It was necessary to proceed with endovaginal exam following the transabdominal exam to visualize the uterus, endometrium, bilateral ovaries and bilateral adnexa. Color and duplex Doppler ultrasound was utilized to evaluate blood flow to the ovaries. COMPARISON:  October 03, 2021 FINDINGS: Uterus Measurements: 9.9 cm x 5.5 cm x 5.9 cm = volume: 167.8 mL. No fibroids or other mass visualized. Endometrium Thickness: 12.4 mm.  No focal abnormality visualized. Right ovary The right ovary is not visualized. Left ovary Measurements: 2.5 cm x 2.1 cm x 2.3 cm = volume: 6.2 mL. Limited in evaluation secondary to overlying bowel gas. A 2.3 cm x 2.8 cm x 2.7 cm anechoic structure is seen adjacent to the left ovary. No abnormal flow is seen within this region on color Doppler evaluation. Pulsed Doppler evaluation of the RIGHT ovary demonstrates normal low-resistance arterial and venous waveforms. Other findings It should be noted that the study is limited in evaluation secondary to overlying bowel gas and a partially filled  urinary bladder. IMPRESSION: 1. Limited study, as described above, without visualization of the right ovary. 2. Thickened, mildly heterogeneous endometrium. Sequelae associated with endometrial hyperplasia cannot be excluded. Gynecology consult with subsequent sonohysterogram should be considered for focal lesion work-up. (Ref: Radiological reasoning: Algorithmic Workup of Abnormal Vaginal Bleeding with endovaginal Sonography and Sonohysterography. AJR 2008; 283:T51-76). 3. Simple left para ovarian cyst. Electronically Signed   By: Virgina Norfolk M.D.   On: 11/14/2021 01:27    2:05 AM Discussed with Dr. Ilda Basset of OB/GYN.  He recommends we treat the patient with Megace 40 mg daily until bleeding ceases.  He advises we refer her to the women's clinic in [place].   Misao Fackrell, Jenny Reichmann, MD 11/14/21 [phone]

## 2021-11-15 LAB — URINE CULTURE

## 2021-12-05 ENCOUNTER — Other Ambulatory Visit: Payer: Self-pay | Admitting: Internal Medicine

## 2021-12-05 MED ORDER — TRAMADOL HCL 50 MG PO TABS: 50.0000 mg | ORAL_TABLET | Freq: Two times a day (BID) | ORAL | 0 refills | Status: DC | PRN

## 2021-12-05 NOTE — Telephone Encounter (Signed)
Name of Medication: tramadol 50 mg Name of Pharmacy: [facility] or Written Date and Quantity: # 30 on 11/05/2021 Last Office Visit and Type: 10/07/2021 FU Next Office Visit and Type: 09/17/2022 CPX  Sending request to Dr Silvio Pate.

## 2021-12-10 ENCOUNTER — Ambulatory Visit
Admission: EM | Admit: 2021-12-10 | Discharge: 2021-12-10 | Disposition: A | Discharge status: Home / Self Care | Payer: BC Managed Care – PPO | Attending: Family Medicine | Admitting: Family Medicine

## 2021-12-10 ENCOUNTER — Encounter: Payer: Self-pay | Admitting: Emergency Medicine

## 2021-12-10 DIAGNOSIS — J988 Other specified respiratory disorders: Secondary | ICD-10-CM | POA: Insufficient documentation

## 2021-12-10 DIAGNOSIS — J029 Acute pharyngitis, unspecified: Secondary | ICD-10-CM | POA: Insufficient documentation

## 2021-12-10 DIAGNOSIS — B9789 Other viral agents as the cause of diseases classified elsewhere: Secondary | ICD-10-CM | POA: Diagnosis not present

## 2021-12-10 LAB — POCT RAPID STREP A (OFFICE): Rapid Strep A Screen: NEGATIVE

## 2021-12-10 MED ORDER — CETIRIZINE HCL 10 MG PO TABS: 10.0000 mg | ORAL_TABLET | Freq: Every day | ORAL | 0 refills | Status: DC

## 2021-12-10 MED ORDER — IPRATROPIUM BROMIDE 0.03 % NA SOLN: 2.0000 | Freq: Two times a day (BID) | NASAL | 0 refills | Status: DC

## 2021-12-10 NOTE — Discharge Instructions (Signed)
Your rapid test was negative.  Throat culture is pending we will notify you only if your throat culture is abnormal.  I have refilled her cetirizine which is an antihistamine resume taking daily.  I have also prescribed Atrovent nasal spray use 2 sprays as needed into both nostrils to help with your nasal symptoms.  Encouraged rest and hydration.  Continue ibuprofen or Tylenol as needed for body aches and headache.

## 2021-12-10 NOTE — ED Provider Notes (Signed)
[facility]    CSN: [id number] Arrival date & time: 12/10/21  1522      History   Chief Complaint Chief Complaint  Patient presents with   Nasal Congestion   Headache   Generalized Body Aches   Sore Throat    HPI Bridget Maldonado is a 41 y.o. female.   HPI Patient presents today with a 2-day history of sinus headache, sinus congestion, generalized body aches, and sore throat.  She has recent exposure to strep as her daughter tested positive for strep.  She has developed fever.  She has been managing symptoms with over-the-counter medicine along with taking ibuprofen to help with headache.  She is negative for any worrisome symptoms such as shortness of breath, wheezing or chest tightness.  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   Increased risk of breast cancer 07/2017   IBIS=40%   Kidney infection    LGSIL on Pap smear of cervix    Migraines    Multiple allergies    Obese    Psoriasis    Psoriatic arthritis ([place])     Patient Active Problem List   Diagnosis Date Noted   Dysfunctional uterine bleeding 10/07/2021   Preventative health care 06/13/2020   Bipolar disease, chronic ([place]) 07/27/2018   Family history of colon cancer 07/27/2017   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   History of cervical dysplasia 07/16/2017   Family history of breast cancer 07/16/2017   Secondary oligomenorrhea 07/16/2017   History of endometriosis 07/16/2017   Psoriatic arthritis ([place]) 10/28/2016   Chronic low back pain 10/28/2016   Acute bronchitis 05/14/2015   Essential (primary) hypertension 10/18/2014   Arthritis 11/24/2013   Arthropathia 11/24/2013   Obstipation  04/12/2013   Migraine with status migrainosus 09/08/2012   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   Mild persistent asthma 05/26/2011   Severe anxiety with panic 08/29/2006   Panic disorder without agoraphobia 08/29/2006    Past Surgical History:  Procedure Laterality Date   BREAST BIOPSY Left 8 years   CESAREAN SECTION     COLONOSCOPY     COLONOSCOPY W/ BIOPSIES     EAR TUBE REMOVAL     ESOPHAGOGASTRODUODENOSCOPY     LAPAROSCOPIC CHOLECYSTECTOMY  6/09   Dr. Hulen Skains   TUBAL LIGATION  2017   TYMPANOSTOMY TUBE PLACEMENT      OB History     Gravida  2   Para  1   Term  1   Preterm      AB  1   Living  1      SAB      IAB  1   Ectopic      Multiple      Live Births           Obstetric Comments  Menstrual age: 69  Age 1st Pregnancy: 68           Home Medications    Prior to Admission medications   Medication Sig Start Date End Date Taking? Authorizing Provider  albuterol (PROVENTIL HFA;VENTOLIN HFA) 108 (90 Base) MCG/ACT inhaler Inhale 2 puffs  into the lungs every 6 (six) hours as needed for wheezing or shortness of breath. 04/16/16  Yes Venia Carbon, MD  ALPRAZolam Duanne Moron) 0.25 MG tablet Take 1 tablet (0.25 mg total) by mouth 2 (two) times daily as needed for anxiety. 11/07/21  Yes Venia Carbon, MD  ARIPiprazole (ABILIFY) 5 MG tablet Take 1 tablet (5 mg total) by mouth at bedtime. 06/13/20  Yes Venia Carbon, MD  DULoxetine (CYMBALTA) 30 MG capsule Take 1 capsule (30 mg total) by mouth daily. 05/09/21  Yes Venia Carbon, MD  DULoxetine (CYMBALTA) 60 MG capsule Take 1 capsule (60 mg total) by mouth daily. 06/13/20  Yes Venia Carbon, MD  Fluticasone-Salmeterol (ADVAIR) 250-50 MCG/DOSE AEPB Inhale 1 puff into the lungs 2 (two) times daily. 06/13/20  Yes Viviana Simpler I, MD  ipratropium (ATROVENT) 0.03 % nasal spray Place 2 sprays into both nostrils 2 (two) times daily. 12/10/21  Yes Scot Jun, FNP   losartan (COZAAR) 50 MG tablet Take 1 tablet (50 mg total) by mouth daily. 06/13/20 12/10/21 Yes Venia Carbon, MD  megestrol (MEGACE) 40 MG tablet Take 1 tablet daily until vaginal bleeding ceases 11/14/21  Yes Molpus, John, MD  montelukast (SINGULAIR) 10 MG tablet Take 1 tablet (10 mg total) by mouth at bedtime. 09/11/21  Yes Venia Carbon, MD  traMADol (ULTRAM) 50 MG tablet Take 1 tablet (50 mg total) by mouth 2 (two) times daily as needed. 12/05/21  Yes Venia Carbon, MD  benzonatate (TESSALON) 200 MG capsule Take 1 capsule (200 mg total) by mouth 3 (three) times daily as needed for cough. 10/30/21   Venia Carbon, MD  cetirizine (ZYRTEC) 10 MG tablet Take 1 tablet (10 mg total) by mouth daily. 12/10/21   Scot Jun, FNP  drospirenone-ethinyl estradiol (YASMIN) 3-0.03 MG tablet Take 1 tablet by mouth daily. 10/08/21   Venia Carbon, MD  HYDROcodone-acetaminophen (NORCO) 7.5-325 MG tablet Take 0.5 tablets by mouth every 6 (six) hours as needed for moderate pain. 10/03/21   Duanne Guess, PA-C  mometasone (ELOCON) 0.1 % ointment APPLY TWICE DAILY AS NEEDED FOR PSORIASIS 10/15/18   Venia Carbon, MD  promethazine-dextromethorphan (PROMETHAZINE-DM) 6.25-15 MG/5ML syrup Take 5 mLs by mouth at bedtime as needed for cough. 10/30/21   Sharion Balloon, NP    Family History Family History  Problem Relation Age of Onset   Colon polyps Maternal Grandfather    Parkinson's disease Maternal Grandfather    Breast cancer Mother 55   Rheum arthritis Mother    Irritable bowel syndrome Mother    Asthma Father    Anxiety disorder Father        Severe, possible bipolar   Irritable bowel syndrome Maternal Grandmother    Diabetes Other        mat. great uncles   Alzheimer's disease Other        Dad's side   Colon cancer Other        mat GGF    Social History Social History   Tobacco Use   Smoking status: Never    Passive exposure: Past   Smokeless tobacco: Never  Vaping  Use   Vaping Use: Never used  Substance Use Topics   Alcohol use: No    Alcohol/week: 0.0 standard drinks of alcohol   Drug use: No     Allergies   Sulfa antibiotics, Ciprofloxacin, and Effexor [venlafaxine hcl]   Review of Systems Review of Systems Pertinent negatives  listed in HPI   Physical Exam Triage Vital Signs ED Triage Vitals  Enc Vitals Group     BP 12/10/21 1555 (!) 150/115     Pulse Rate 12/10/21 1555 99     Resp 12/10/21 1555 18     Temp 12/10/21 1555 99.6 F (37.6 C)     Temp Source 12/10/21 1555 Oral     SpO2 12/10/21 1555 99 %     Weight --      Height --      Head Circumference --      Peak Flow --      Pain Score 12/10/21 1558 0     Pain Loc --      Pain Edu? --      Excl. in [place]? --    No data found.  Updated Vital Signs BP (!) 150/115 (BP Location: Left Arm)   Pulse 99   Temp 99.6 F (37.6 C) (Oral)   Resp 18   LMP  (LMP Unknown)   SpO2 99%   Visual Acuity Right Eye Distance:   Left Eye Distance:   Bilateral Distance:    Right Eye Near:   Left Eye Near:    Bilateral Near:     Physical Exam  General Appearance:    Alert, acutely ill-appearing, cooperative  HENT:   Normocephalic, ears normal, nares mucosal edema with congestion, rhinorrhea, oropharynx mild erythema without swelling  Eyes:    PERRL, conjunctiva/corneas clear, EOM's intact       Lungs:     Clear to auscultation bilaterally, respirations unlabored  Heart:    Regular rate and rhythm  Neurologic:   Awake, alert, oriented x 3. No apparent focal neurological           defect.       UC Treatments / Results  Labs (all labs ordered are listed, but only abnormal results are displayed) Labs Reviewed  POCT RAPID STREP A (OFFICE)    EKG   Radiology No results found.  Procedures Procedures (including critical care time)  Medications Ordered in UC Medications - No data to display  Initial Impression / Assessment and Plan / UC Course  I have reviewed the triage  vital signs and the nursing notes.  Pertinent labs & imaging results that were available during my care of the patient were reviewed by me and considered in my medical decision making (see chart for details).    Viral Respiratory Illness  Rapid strep is negative.  Throat culture is pending.  Symptomatic management of symptoms warranted.  Hydrate well with fluids.  Continue ibuprofen as needed for headache and generalized body aches.  If symptoms have not improved within 7 days return for evaluation Final Clinical Impressions(s) / UC Diagnoses   Final diagnoses:  Viral respiratory illness     Discharge Instructions      Your rapid test was negative.  Throat culture is pending we will notify you only if your throat culture is abnormal.  I have refilled her cetirizine which is an antihistamine resume taking daily.  I have also prescribed Atrovent nasal spray use 2 sprays as needed into both nostrils to help with your nasal symptoms.  Encouraged rest and hydration.  Continue ibuprofen or Tylenol as needed for body aches and headache.     ED Prescriptions     Medication Sig Dispense Auth. Provider   ipratropium (ATROVENT) 0.03 % nasal spray Place 2 sprays into both nostrils 2 (two) times daily. 30 mL Eagle Pitta,  Carroll Sage, FNP   cetirizine (ZYRTEC) 10 MG tablet Take 1 tablet (10 mg total) by mouth daily. 30 tablet Scot Jun, FNP      PDMP not reviewed this encounter.   Scot Jun, FNP 12/10/21 [phone]

## 2021-12-10 NOTE — ED Triage Notes (Signed)
Pt presents with cough, nasal congestion, chills and bodyaches x 2-3 days. Her daughter tested positive for strep 3 days ago.

## 2021-12-13 DIAGNOSIS — J209 Acute bronchitis, unspecified: Secondary | ICD-10-CM | POA: Diagnosis not present

## 2021-12-13 DIAGNOSIS — J45901 Unspecified asthma with (acute) exacerbation: Secondary | ICD-10-CM | POA: Diagnosis not present

## 2021-12-13 DIAGNOSIS — Z03818 Encounter for observation for suspected exposure to other biological agents ruled out: Secondary | ICD-10-CM | POA: Diagnosis not present

## 2021-12-13 LAB — CULTURE, GROUP A STREP (THRC)

## 2021-12-23 ENCOUNTER — Other Ambulatory Visit (HOSPITAL_COMMUNITY)
Admission: RE | Admit: 2021-12-23 | Discharge: 2021-12-23 | Disposition: A | Discharge status: Home / Self Care | Payer: BC Managed Care – PPO | Source: Ambulatory Visit | Attending: Obstetrics and Gynecology | Admitting: Obstetrics and Gynecology

## 2021-12-23 ENCOUNTER — Other Ambulatory Visit: Payer: Self-pay | Admitting: Obstetrics and Gynecology

## 2021-12-23 ENCOUNTER — Ambulatory Visit (INDEPENDENT_AMBULATORY_CARE_PROVIDER_SITE_OTHER): Payer: BC Managed Care – PPO | Admitting: Obstetrics and Gynecology

## 2021-12-23 ENCOUNTER — Encounter: Payer: Self-pay | Admitting: Obstetrics and Gynecology

## 2021-12-23 VITALS — BP 155/104 | HR 94 | Ht 65.0 in | Wt 218.8 lb

## 2021-12-23 DIAGNOSIS — N72 Inflammatory disease of cervix uteri: Secondary | ICD-10-CM | POA: Diagnosis not present

## 2021-12-23 DIAGNOSIS — Z124 Encounter for screening for malignant neoplasm of cervix: Secondary | ICD-10-CM | POA: Diagnosis not present

## 2021-12-23 DIAGNOSIS — N915 Oligomenorrhea, unspecified: Secondary | ICD-10-CM | POA: Diagnosis not present

## 2021-12-23 DIAGNOSIS — N939 Abnormal uterine and vaginal bleeding, unspecified: Secondary | ICD-10-CM | POA: Diagnosis not present

## 2021-12-23 HISTORY — PX: ENDOMETRIAL BIOPSY: PRO73

## 2021-12-23 LAB — POCT URINE PREGNANCY: Preg Test, Ur: NEGATIVE

## 2021-12-23 NOTE — Procedures (Signed)
Endometrial Biopsy Procedure Note  Pre-operative Diagnosis: AUB. Oligomenorrhea. BMI 36. HTN  Post-operative Diagnosis: same  Procedure Details  Urine pregnancy test was done and result was negative.  The risks (including infection, bleeding, pain, and uterine perforation) and benefits of the procedure were explained to the patient and Written informed consent was obtained.  The patient was placed in the dorsal lithotomy position.  Bimanual exam showed the uterus to be in the neutral position.  A Graves' speculum inserted in the vagina, and the cervix was visualized and a pap smear performed. The cervix was then prepped with povidone iodine, and a pipelle was inserted into the uterine cavity and sounded the uterus to a depth of 9cm.  A Small amount of tissue was collected after 2 passes. The sample was sent for pathologic examination.  Condition: Stable  Complications: None  Plan: The patient was advised to call for any fever or for prolonged or severe pain or bleeding. She was advised to use OTC analgesics as needed for mild to moderate pain. She was advised to avoid vaginal intercourse for 48 hours or until the bleeding has completely stopped.  Durene Romans MD Attending Center for [employer] [facility] [employer])

## 2021-12-23 NOTE — Progress Notes (Signed)
Obstetrics and Gynecology New Patient Evaluation  Appointment Date: 12/23/2021  OBGYN Clinic: [facility]  Primary Care Provider: Viviana Simpler I  Referring Provider: Self  Chief Complaint: AUB  History of Present Illness: Bridget Maldonado is a 41 y.o. Caucasian G2P1011 (Patient's last menstrual period was 11/05/2021 (approximate).), seen for the above chief complaint. Her past medical history is significant for psoriasis    Patient states that she got methotrexate, for psoriasis, back in 2016-2017 and that caused her to lose her period and no period since 2018. She says she was amenorrheic until January this year where she's had AUB nearly qday that was also painful. She last went to the ED in may and they gave her megace which helped and she had normal CBC, neg quant and neg u/s  She last took the megace in late may and hasn't had any bleeding since.   Review of Systems: Pertinent items noted in HPI and remainder of comprehensive ROS otherwise negative.    Patient Active Problem List   Diagnosis Date Noted   Dysfunctional uterine bleeding 10/07/2021   Preventative health care 06/13/2020   Bipolar disease, chronic ([place]) 07/27/2018   Family history of colon cancer 07/27/2017   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   History of cervical dysplasia 07/16/2017   Family history of breast cancer 07/16/2017   Secondary oligomenorrhea 07/16/2017   History of endometriosis 07/16/2017   Psoriatic arthritis ([place]) 10/28/2016   Chronic low back pain 10/28/2016   Acute bronchitis 05/14/2015   Essential (primary) hypertension 10/18/2014   Arthritis 11/24/2013   Arthropathia 11/24/2013   Obstipation 04/12/2013   Migraine with status migrainosus 09/08/2012   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   Mild persistent asthma 05/26/2011   Severe anxiety with panic 08/29/2006   Panic disorder without agoraphobia 08/29/2006    Past Medical  History:  Past Medical History:  Diagnosis Date   Anxiety    Possible bipolar, Type II   Arthritis    Asthma    Bipolar disorder ([place])    BRCA negative 07/2017   vistaseq neg   Chronic headaches    Endometriosis 2018   Family history of breast cancer    Hemorrhoids 08/2007   IBS (irritable bowel syndrome) 08/2007   Ileitis, 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 Hulen Skains   TUBAL LIGATION  2017   TYMPANOSTOMY TUBE PLACEMENT      Past Obstetrical History:  OB History  Gravida Para Term Preterm AB Living  '2 1 1   1 1  ' SAB IAB Ectopic Multiple Live Births    1          # Outcome Date GA Lbr Len/2nd Weight Sex Delivery Anes PTL Lv  2 IAB           1 Term             Obstetric Comments  Menstrual age: 83    Age  1st Pregnancy: 72    Past Gynecological History: As per HPI. She is currently using bilateral tubal ligation for contraception.  History of HRT use: No  Social History:  Social History   Socioeconomic History   Marital status: Married    Spouse name: Not on file   Number of children: 1   Years of education: Not on file   Highest education level: Not on file  Occupational History   Occupation: Administration    Employer: [employer]  Tobacco Use   Smoking status: Never    Passive exposure: Past   Smokeless tobacco: Never  Vaping Use   Vaping Use: Never used  Substance and Sexual Activity   Alcohol use: No    Alcohol/week: 0.0 standard drinks of alcohol   Drug use: No   Sexual activity: Yes    Birth control/protection: Surgical  Other  Topics Concern   Not on file  Social History Narrative   Married 10/22   Social Determinants of Health   Financial Resource Strain: Not on file  Food Insecurity: Not on file  Transportation Needs: Not on file  Physical Activity: Not on file  Stress: Not on file  Social Connections: Not on file  Intimate Partner Violence: Not on file    Family History:  Family History  Problem Relation Age of Onset   Colon polyps Maternal Grandfather    Parkinson's disease Maternal Grandfather    Breast cancer Mother 67   Rheum arthritis Mother    Irritable bowel syndrome Mother    Asthma Father    Anxiety disorder Father        Severe, possible bipolar   Irritable bowel syndrome Maternal Grandmother    Diabetes Other        mat. great uncles   Alzheimer's disease Other        Dad's side   Colon cancer Other        mat GGF    Medications Terance Hart. [place] had no medications administered during this visit. Current Outpatient Medications  Medication Sig Dispense Refill   albuterol (PROVENTIL HFA;VENTOLIN HFA) 108 (90 Base) MCG/ACT inhaler Inhale 2 puffs into the lungs every 6 (six) hours as needed for wheezing or shortness of breath. 1 Inhaler 1   ALPRAZolam (XANAX) 0.25 MG tablet Take 1 tablet (0.25 mg total) by mouth 2 (two) times daily as needed for anxiety. 60 tablet 0   ARIPiprazole (ABILIFY) 5 MG tablet Take 1 tablet (5 mg total) by mouth at bedtime. 90 tablet 3   cetirizine (ZYRTEC) 10 MG tablet Take 1 tablet (10 mg total) by mouth daily. 30 tablet 0   DULoxetine (CYMBALTA) 30 MG capsule Take 1 capsule (30 mg total) by mouth daily. 30 capsule 3   DULoxetine (CYMBALTA) 60 MG capsule Take 1 capsule (60 mg total) by mouth daily. 90 capsule 3   Fluticasone-Salmeterol (ADVAIR) 250-50 MCG/DOSE AEPB Inhale 1 puff into the lungs 2 (two) times daily. 180 each 3   megestrol (MEGACE) 40 MG tablet Take 1 tablet daily until vaginal bleeding ceases 15 tablet 0   mometasone (ELOCON) 0.1 %  ointment APPLY TWICE DAILY AS NEEDED FOR PSORIASIS 45 g 1   montelukast (SINGULAIR) 10 MG tablet Take 1 tablet (10 mg total) by mouth at bedtime. 90 tablet 3   traMADol (ULTRAM) 50 MG tablet Take 1 tablet (50 mg total) by mouth 2 (two) times daily as needed. 30 tablet 0   drospirenone-ethinyl estradiol (YASMIN) 3-0.03 MG tablet Take 1 tablet  by mouth daily. (Patient not taking: Reported on 12/23/2021) 28 tablet 11   HYDROcodone-acetaminophen (NORCO) 7.5-325 MG tablet Take 0.5 tablets by mouth every 6 (six) hours as needed for moderate pain. (Patient not taking: Reported on 12/23/2021) 10 tablet 0   ipratropium (ATROVENT) 0.03 % nasal spray Place 2 sprays into both nostrils 2 (two) times daily. (Patient not taking: Reported on 12/23/2021) 30 mL 0   losartan (COZAAR) 50 MG tablet Take 1 tablet (50 mg total) by mouth daily. 90 tablet 3   promethazine-dextromethorphan (PROMETHAZINE-DM) 6.25-15 MG/5ML syrup Take 5 mLs by mouth at bedtime as needed for cough. (Patient not taking: Reported on 12/23/2021) 60 mL 0   No current facility-administered medications for this visit.    Allergies Sulfa antibiotics, Ciprofloxacin, and Effexor [venlafaxine hcl]   Physical Exam:  BP (!) 155/104   Pulse 94   Ht '5\' 5"'  (1.651 m)   Wt 218 lb 12.8 oz (99.2 kg)   LMP 11/05/2021 (Approximate)   BMI 36.41 kg/m  Body mass index is 36.41 kg/m.  General appearance: Well nourished, well developed female in no acute distress.  Neck:  Supple, normal appearance, and no thyromegaly  Cardiovascular: normal s1 and s2.  No murmurs, rubs or gallops. Respiratory:  Clear to auscultation bilateral. Normal respiratory effort Abdomen: positive bowel sounds and no masses, hernias; diffusely non tender to palpation, non distended Neuro/Psych:  Normal mood and affect.  Skin:  Warm and dry.  Lymphatic:  No inguinal lymphadenopathy.   Pelvic exam: is not limited by body habitus; no e/o atrophy EGBUS: within normal limits Vagina: within  normal limits and with no blood or discharge in the vault Cervix: normal appearing cervix without tenderness, discharge or lesions.  Uterus:  nonenlarged and non tender Adnexa:  normal adnexa and no mass, fullness, tenderness Rectovaginal: deferred  See procedure note for embx  Laboratory: as per HPI  Radiology:  Narrative & Impression  CLINICAL DATA:  Pelvic pain and heavy vaginal bleeding x5 months.   EXAM: TRANSABDOMINAL AND TRANSVAGINAL ULTRASOUND OF PELVIS   DOPPLER ULTRASOUND OF OVARIES   TECHNIQUE: Both transabdominal and transvaginal ultrasound examinations of the pelvis were performed. Transabdominal technique was performed for global imaging of the pelvis including uterus, ovaries, adnexal regions, and pelvic cul-de-sac.   It was necessary to proceed with endovaginal exam following the transabdominal exam to visualize the uterus, endometrium, bilateral ovaries and bilateral adnexa. Color and duplex Doppler ultrasound was utilized to evaluate blood flow to the ovaries.   COMPARISON:  October 03, 2021   FINDINGS: Uterus   Measurements: 9.9 cm x 5.5 cm x 5.9 cm = volume: 167.8 mL. No fibroids or other mass visualized.   Endometrium   Thickness: 12.4 mm.  No focal abnormality visualized.   Right ovary   The right ovary is not visualized.   Left ovary   Measurements: 2.5 cm x 2.1 cm x 2.3 cm = volume: 6.2 mL. Limited in evaluation secondary to overlying bowel gas. A 2.3 cm x 2.8 cm x 2.7 cm anechoic structure is seen adjacent to the left ovary. No abnormal flow is seen within this region on color Doppler evaluation.   Pulsed Doppler evaluation of the RIGHT ovary demonstrates normal low-resistance arterial and venous waveforms.   Other findings   It should be noted that the study is limited in evaluation secondary to overlying bowel gas and a partially filled urinary bladder.   IMPRESSION: 1. Limited study, as described above, without visualization of  the right  ovary. 2. Thickened, mildly heterogeneous endometrium. Sequelae associated with endometrial hyperplasia cannot be excluded. Gynecology consult with subsequent sonohysterogram should be considered for focal lesion work-up. (Ref: Radiological reasoning: Algorithmic Workup of Abnormal Vaginal Bleeding with endovaginal Sonography and Sonohysterography. AJR 2008; 060:O45-99). 3. Simple left para ovarian cyst.     Electronically Signed   By: Virgina Norfolk M.D.   On: 11/14/2021 01:27    Assessment: pt stable  Plan:  1. Cervical cancer screening - Cytology - PAP  2. Oligomenorrhea/AUB I saw in 2019 she had an [place] in the 30s, Estradiol 59, and a normal PRL, TSH and based on her exam and lack of menopausal s/s I told her I don't believe that she is in POF. F/u lab work and will d/w pt re: next steps; pt amenable to embx today given long history of amenorrhea and not believed to menopause and it's reassuring that her endometrial stripe was normal.  She has megace at home and I told her if bleeding comes back to restart it as before.  - TSH+Prl+TestT+TestF+17OHP; Future - DHEA-sulfate; Future -FSH -Estradiol  RTC 3wks  Durene Romans MD Attending Center for [facility])

## 2021-12-25 ENCOUNTER — Other Ambulatory Visit: Payer: BC Managed Care – PPO

## 2021-12-25 DIAGNOSIS — N915 Oligomenorrhea, unspecified: Secondary | ICD-10-CM

## 2021-12-26 LAB — ESTRADIOL: Estradiol: 128 pg/mL

## 2021-12-26 LAB — DHEA-SULFATE: DHEA-SO4: 140 ug/dL (ref 57.3–279.2)

## 2021-12-26 LAB — FOLLICLE STIMULATING HORMONE: FSH: 7.1 m[IU]/mL

## 2021-12-27 LAB — CYTOLOGY - PAP
Comment: NEGATIVE
Comment: NEGATIVE
Comment: NEGATIVE
Diagnosis: UNDETERMINED — AB
HPV 16: NEGATIVE
HPV 18 / 45: NEGATIVE
High risk HPV: POSITIVE — AB

## 2021-12-28 LAB — TSH+PRL+TESTT+TESTF+17OHP
17-Hydroxyprogesterone: 10 ng/dL
Prolactin: 16.5 ng/mL (ref 4.8–23.3)
TSH: 0.824 u[IU]/mL (ref 0.450–4.500)
Testosterone, Free: 0.6 pg/mL (ref 0.0–4.2)
Testosterone, Total, LC/MS: 15.2 ng/dL (ref 10.0–55.0)

## 2022-01-01 ENCOUNTER — Other Ambulatory Visit: Payer: Self-pay | Admitting: Internal Medicine

## 2022-01-01 MED ORDER — TRAMADOL HCL 50 MG PO TABS: 50.0000 mg | ORAL_TABLET | Freq: Two times a day (BID) | ORAL | 0 refills | Status: DC | PRN

## 2022-01-01 NOTE — Telephone Encounter (Signed)
Refill request for traMADol (ULTRAM) 50 MG tablet  LOV - 10/07/21 Next OV - 09/17/22  Last refill - 12/05/21 #30/0

## 2022-01-16 ENCOUNTER — Other Ambulatory Visit: Payer: Self-pay | Admitting: Internal Medicine

## 2022-01-16 MED ORDER — ALPRAZOLAM 0.25 MG PO TABS: 0.2500 mg | ORAL_TABLET | Freq: Two times a day (BID) | ORAL | 0 refills | Status: DC | PRN

## 2022-01-16 NOTE — Telephone Encounter (Signed)
Refill request for ALPRAZolam (XANAX) 0.25 MG tablet  LOV - 10/07/21 Next OV - 09/17/22 Last refill - 11/07/21 #60/0

## 2022-01-23 ENCOUNTER — Ambulatory Visit (INDEPENDENT_AMBULATORY_CARE_PROVIDER_SITE_OTHER): Payer: BC Managed Care – PPO | Admitting: Obstetrics and Gynecology

## 2022-01-23 ENCOUNTER — Encounter: Payer: Self-pay | Admitting: Obstetrics and Gynecology

## 2022-01-23 VITALS — BP 155/105 | HR 102 | Wt 213.0 lb

## 2022-01-23 DIAGNOSIS — Z01818 Encounter for other preprocedural examination: Secondary | ICD-10-CM

## 2022-01-23 MED ORDER — MEGESTROL ACETATE 40 MG PO TABS: ORAL_TABLET | ORAL | 0 refills | Status: DC

## 2022-01-23 NOTE — Progress Notes (Addendum)
Obstetrics and Gynecology Return Patient Evaluation  Appointment Date: 01/23/2022  OBGYN Clinic: [facility]  Primary Care Provider: Viviana Simpler I  Referring Provider: Self  Chief Complaint: AUB follow up  History of Present Illness: Bridget Maldonado is a 41 y.o. Caucasian G2P1011 (No LMP recorded.), seen for the above chief complaint. Her past medical history is significant for c/s x 1, h/o BTL, h/o l/s chole, psoriasis, BMI 30s, HTN  Patient initially seen by me for new patient exam on 7/3 for new AUB. She stated that she got methotrexate, for psoriasis, back in 2016-2017 and that caused her to lose her period and no period since 2018. She says she was amenorrheic until January this year where she's had AUB nearly qday that was also painful. She last went to the ED in may and they gave her megace which helped and she had normal CBC, neg quant and neg u/s  She last took the megace in late may and hasn't had any bleeding since. She had a negative embx (inactive endometrium) and had prior neg u/s. +ascus/hpv+ but 16/18/45 negative PCOS panel negative and normal/low FSH at7.1 and estradiol >100  Interval History: She had some AUB since her last visit but taking megace a few times stopped it.   Review of Systems: Pertinent items noted in HPI and remainder of comprehensive ROS otherwise negative.    Patient Active Problem List   Diagnosis Date Noted   Dysfunctional uterine bleeding 10/07/2021   Preventative health care 06/13/2020   Bipolar disease, chronic ([place]) 07/27/2018   Family history of colon cancer 07/27/2017   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   History of cervical dysplasia 07/16/2017   Family history of breast cancer 07/16/2017   Secondary oligomenorrhea 07/16/2017   History of endometriosis 07/16/2017   Psoriatic arthritis ([place]) 10/28/2016   Chronic low back pain 10/28/2016   Acute bronchitis 05/14/2015   Essential (primary)  hypertension 10/18/2014   Arthritis 11/24/2013   Arthropathia 11/24/2013   Obstipation 04/12/2013   Migraine with status migrainosus 09/08/2012   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   Mild persistent asthma 05/26/2011   Severe anxiety with panic 08/29/2006   Panic disorder without agoraphobia 08/29/2006    Past Medical History:  Past Medical History:  Diagnosis Date   Anxiety    Possible bipolar, Type II   Arthritis    Asthma    Bipolar disorder ([place])    BRCA negative 07/2017   vistaseq neg   Chronic headaches    Endometriosis 2018   Family history of breast cancer    Hemorrhoids 08/2007   IBS (irritable bowel syndrome) 08/2007   Ileitis, 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 Hulen Skains   TUBAL LIGATION  2017   TYMPANOSTOMY TUBE PLACEMENT      Past Obstetrical History:  OB History  Gravida Para Term Preterm AB Living  2 1 1  1 1  SAB IAB Ectopic Multiple Live Births    1          # Outcome Date GA Lbr Len/2nd Weight Sex Delivery Anes PTL Lv  2 IAB           1 Term             Obstetric Comments  Menstrual age: 54    Age 1st Pregnancy: 64    Past Gynecological History: As per HPI. She is currently using bilateral tubal ligation for contraception.  History of HRT use: No  Social History:  Social History   Socioeconomic History   Marital status: Married    Spouse name: Not on file   Number of children: 1   Years of education: Not on file   Highest education level: Not on file   Occupational History   Occupation: Administration    Employer: [employer]  Tobacco Use   Smoking status: Never    Passive exposure: Past   Smokeless tobacco: Never  Vaping Use   Vaping Use: Never used  Substance and Sexual Activity   Alcohol use: No    Alcohol/week: 0.0 standard drinks of alcohol   Drug use: No   Sexual activity: Yes    Birth control/protection: Surgical  Other Topics Concern   Not on file  Social History Narrative   Married 10/22   Social Determinants of Health   Financial Resource Strain: Not on file  Food Insecurity: Not on file  Transportation Needs: Not on file  Physical Activity: Not on file  Stress: Not on file  Social Connections: Not on file  Intimate Partner Violence: Not on file    Family History:  Family History  Problem Relation Age of Onset   Colon polyps Maternal Grandfather    Parkinson's disease Maternal Grandfather    Breast cancer Mother 32   Rheum arthritis Mother    Irritable bowel syndrome Mother    Asthma Father    Anxiety disorder Father        Severe, possible bipolar   Irritable bowel syndrome Maternal Grandmother    Diabetes Other        mat. great uncles   Alzheimer's disease Other        Dad's side   Colon cancer Other        mat GGF    Medications Bridget Maldonado had no medications administered during this visit. Current Outpatient Medications  Medication Sig Dispense Refill   albuterol (PROVENTIL HFA;VENTOLIN HFA) 108 (90 Base) MCG/ACT inhaler Inhale 2 puffs into the lungs every 6 (six) hours as needed for wheezing or shortness of breath. 1 Inhaler 1   ALPRAZolam (XANAX) 0.25 MG tablet Take 1 tablet (0.25 mg total) by mouth 2 (two) times daily as needed for anxiety. 60 tablet 0   ARIPiprazole (ABILIFY) 5 MG tablet Take 1 tablet (5 mg total) by mouth at bedtime. 90 tablet 3   cetirizine (ZYRTEC) 10 MG tablet Take 1 tablet (10 mg total) by mouth daily. 30 tablet 0   DULoxetine (CYMBALTA) 30 MG capsule Take 1  capsule (30 mg total) by mouth daily. 30 capsule 3   DULoxetine (CYMBALTA) 60 MG capsule Take 1 capsule (60 mg total) by mouth daily. 90 capsule 3   Fluticasone-Salmeterol (ADVAIR) 250-50 MCG/DOSE AEPB Inhale 1 puff into the lungs 2 (two) times daily. 180 each 3   mometasone (ELOCON) 0.1 % ointment APPLY TWICE DAILY AS NEEDED FOR PSORIASIS 45 g 1  montelukast (SINGULAIR) 10 MG tablet Take 1 tablet (10 mg total) by mouth at bedtime. 90 tablet 3   traMADol (ULTRAM) 50 MG tablet Take 1 tablet (50 mg total) by mouth 2 (two) times daily as needed. 30 tablet 0   HYDROcodone-acetaminophen (NORCO) 7.5-325 MG tablet Take 0.5 tablets by mouth every 6 (six) hours as needed for moderate pain. (Patient not taking: Reported on 12/23/2021) 10 tablet 0   ipratropium (ATROVENT) 0.03 % nasal spray Place 2 sprays into both nostrils 2 (two) times daily. (Patient not taking: Reported on 12/23/2021) 30 mL 0   losartan (COZAAR) 50 MG tablet Take 1 tablet (50 mg total) by mouth daily. 90 tablet 3   megestrol (MEGACE) 40 MG tablet 1-2 tab po qday prn 20 tablet 0   promethazine-dextromethorphan (PROMETHAZINE-DM) 6.25-15 MG/5ML syrup Take 5 mLs by mouth at bedtime as needed for cough. (Patient not taking: Reported on 12/23/2021) 60 mL 0   No current facility-administered medications for this visit.    Allergies Sulfa antibiotics, Ciprofloxacin, and Effexor [venlafaxine hcl]   Physical Exam:  BP (!) 155/105   Pulse (!) 102   Wt 213 lb (96.6 kg)   BMI 35.45 kg/m  Body mass index is 35.45 kg/m.  General appearance: Well nourished, well developed female in no acute distress.  Abdomen: obese, soft, nttp  From 7/3 Pelvic exam: is not limited by body habitus; no e/o atrophy EGBUS: within normal limits Vagina: within normal limits and with no blood or discharge in the vault Cervix: normal appearing cervix without tenderness, discharge or lesions.  Uterus:  nonenlarged and non tender Adnexa:  normal adnexa and no mass,  fullness, tenderness Rectovaginal: deferred  Laboratory: as per HPI  Radiology:  Narrative & Impression  CLINICAL DATA:  Pelvic pain and heavy vaginal bleeding x5 months.   EXAM: TRANSABDOMINAL AND TRANSVAGINAL ULTRASOUND OF PELVIS   DOPPLER ULTRASOUND OF OVARIES   TECHNIQUE: Both transabdominal and transvaginal ultrasound examinations of the pelvis were performed. Transabdominal technique was performed for global imaging of the pelvis including uterus, ovaries, adnexal regions, and pelvic cul-de-sac.   It was necessary to proceed with endovaginal exam following the transabdominal exam to visualize the uterus, endometrium, bilateral ovaries and bilateral adnexa. Color and duplex Doppler ultrasound was utilized to evaluate blood flow to the ovaries.   COMPARISON:  October 03, 2021   FINDINGS: Uterus   Measurements: 9.9 cm x 5.5 cm x 5.9 cm = volume: 167.8 mL. No fibroids or other mass visualized.   Endometrium   Thickness: 12.4 mm.  No focal abnormality visualized.   Right ovary   The right ovary is not visualized.   Left ovary   Measurements: 2.5 cm x 2.1 cm x 2.3 cm = volume: 6.2 mL. Limited in evaluation secondary to overlying bowel gas. A 2.3 cm x 2.8 cm x 2.7 cm anechoic structure is seen adjacent to the left ovary. No abnormal flow is seen within this region on color Doppler evaluation.   Pulsed Doppler evaluation of the RIGHT ovary demonstrates normal low-resistance arterial and venous waveforms.   Other findings   It should be noted that the study is limited in evaluation secondary to overlying bowel gas and a partially filled urinary bladder.   IMPRESSION: 1. Limited study, as described above, without visualization of the right ovary. 2. Thickened, mildly heterogeneous endometrium. Sequelae associated with endometrial hyperplasia cannot be excluded. Gynecology consult with subsequent sonohysterogram should be considered for focal lesion work-up.  (Ref: Radiological reasoning: Algorithmic Workup  of Abnormal Vaginal Bleeding with endovaginal Sonography and Sonohysterography. AJR 2008; 425:L25-89). 3. Simple left para ovarian cyst.     Electronically Signed   By: Virgina Norfolk M.D.   On: 11/14/2021 01:27    Assessment: pt stable  Plan:  1. Oligomenorrhea/AUB Long d/w pt re: treatment options including cyclic provera; hormones such as Mirena, depo provera or OCPs (so okay if amenorrheic on this) and she would like definitive surgery to avoid being on hormones.   The risks of a hysterectomy were discussed in detail with the patient including but not limited to: bleeding which may require transfusion or reoperation; infection which may require prolonged hospitalization or re-hospitalization and antibiotic therapy; injury to bowel, bladder, ureters and major vessels or other surrounding organs which may lead to other procedures; formation of adhesions; need for additional procedures including laparotomy or subsequent procedures secondary to intraoperative injury or abnormal pathology; thromboembolic phenomenon; incisional problems and other postoperative or anesthesia complications.  Patient was told that the likelihood that her condition and symptoms will be treated effectively with this surgical management was very high; the postoperative expectations were also discussed in detail. The patient also understands the alternative treatment options which were discussed in full. All questions were answered.   After d/w pt she would like to proceed with TLH/BS/cystoscopy. Request sent to scheduling.  Note sent to PCP for any pre operative testing.   Durene Romans MD Attending [facility] for [facility])

## 2022-01-23 NOTE — Progress Notes (Signed)
RGYN follow up on vaginal bleeding.  Pt notes much improvement states bleeding has decreased and is rarely there.  Only takes  (Megace)Rx when having heavy bleeding.

## 2022-02-11 DIAGNOSIS — R0981 Nasal congestion: Secondary | ICD-10-CM | POA: Diagnosis not present

## 2022-02-11 DIAGNOSIS — J45901 Unspecified asthma with (acute) exacerbation: Secondary | ICD-10-CM | POA: Diagnosis not present

## 2022-02-12 ENCOUNTER — Encounter: Payer: Self-pay | Admitting: Obstetrics and Gynecology

## 2022-02-14 ENCOUNTER — Other Ambulatory Visit: Payer: Self-pay | Admitting: Internal Medicine

## 2022-02-14 MED ORDER — TRAMADOL HCL 50 MG PO TABS
50.0000 mg | ORAL_TABLET | Freq: Two times a day (BID) | ORAL | 0 refills | Status: DC | PRN
Start: 2022-02-14 — End: 2022-03-05

## 2022-02-14 NOTE — Telephone Encounter (Signed)
Last filled 01-01-22 #30 Last OV 10-07-21 Next OV 09-17-22 [place]

## 2022-02-21 NOTE — Pre-Procedure Instructions (Signed)
Surgical Instructions    Your procedure is scheduled on Tuesday 03/04/22.   Report to [facility] Main Entrance "A" at 10:30 A.M., then check in with the Admitting office.  Call this number if you have problems the morning of surgery:  [phone]   If you have any questions prior to your surgery date call [phone]: Open Monday-Friday 8am-4pm    Remember:  Do not eat after midnight the night before your surgery  You may drink clear liquids until 09:30 A.M. the morning of your surgery.   Clear liquids allowed are: Water, Non-Citrus Juices (without pulp), Carbonated Beverages, Clear Tea, Black Coffee ONLY (NO MILK, CREAM OR POWDERED CREAMER of any kind), and Gatorade    Take these medicines the morning of surgery with A SIP OF WATER:   cetirizine (ZYRTEC)   DULoxetine (CYMBALTA)  Fluticasone-Salmeterol (ADVAIR)    Take these medicines if needed:   albuterol (PROVENTIL HFA;VENTOLIN HFA)   ALPRAZolam (XANAX)  traMADol (ULTRAM)   As of today, STOP taking any Aspirin (unless otherwise instructed by your surgeon) Aleve, Naproxen, Ibuprofen, Motrin, Advil, Goody's, BC's, all herbal medications, fish oil, and all vitamins.           Do not wear jewelry or makeup. Do not wear lotions, powders, perfumes/cologne or deodorant. Do not shave 48 hours prior to surgery.  Men may shave face and neck. Do not bring valuables to the hospital. Do not wear nail polish, gel polish, artificial nails, or any other type of covering on natural nails (fingers and toes) If you have artificial nails or gel coating that need to be removed by a nail salon, please have this removed prior to surgery. Artificial nails or gel coating may interfere with anesthesia's ability to adequately monitor your vital signs.  [facility] is not responsible for any belongings or valuables.    Do NOT Smoke (Tobacco/Vaping)  24 hours prior to your procedure  If you use a CPAP at night, you may bring your mask for your  overnight stay.   Contacts, glasses, hearing aids, dentures or partials may not be worn into surgery, please bring cases for these belongings   For patients admitted to the hospital, discharge time will be determined by your treatment team.   Patients discharged the day of surgery will not be allowed to drive home, and someone needs to stay with them for 24 hours.   SURGICAL WAITING ROOM VISITATION Patients having surgery or a procedure may have no more than 2 support people in the waiting area - these visitors may rotate.   Children under the age of 68 must have an adult with them who is not the patient. If the patient needs to stay at the hospital during part of their recovery, the visitor guidelines for inpatient rooms apply. Pre-op nurse will coordinate an appropriate time for 1 support person to accompany patient in pre-op.  This support person may not rotate.   Please refer to the [facility] website for the visitor guidelines for Inpatients (after your surgery is over and you are in a regular room).    Special instructions:    Oral Hygiene is also important to reduce your risk of infection.  Remember - BRUSH YOUR TEETH THE MORNING OF SURGERY WITH YOUR REGULAR TOOTHPASTE   [place]- Preparing For Surgery  Before surgery, you can play an important role. Because skin is not sterile, your skin needs to be as free of germs as possible. You can reduce the number of germs  on your skin by washing with CHG (chlorahexidine gluconate) Soap before surgery.  CHG is an antiseptic cleaner which kills germs and bonds with the skin to continue killing germs even after washing.     Please do not use if you have an allergy to CHG or antibacterial soaps. If your skin becomes reddened/irritated stop using the CHG.  Do not shave (including legs and underarms) for at least 48 hours prior to first CHG shower. It is OK to shave your face.  Please follow these instructions carefully.     Shower the  NIGHT BEFORE SURGERY and the MORNING OF SURGERY with CHG Soap.   If you chose to wash your hair, wash your hair first as usual with your normal shampoo. After you shampoo, rinse your hair and body thoroughly to remove the shampoo.  Then [employer] and genitals (private parts) with your normal soap and rinse thoroughly to remove soap.  After that Use CHG Soap as you would any other liquid soap. You can apply CHG directly to the skin and wash gently with a scrungie or a clean washcloth.   Apply the CHG Soap to your body ONLY FROM THE NECK DOWN.  Do not use on open wounds or open sores. Avoid contact with your eyes, ears, mouth and genitals (private parts). Wash Face and genitals (private parts)  with your normal soap.   Wash thoroughly, paying special attention to the area where your surgery will be performed.  Thoroughly rinse your body with warm water from the neck down.  DO NOT shower/wash with your normal soap after using and rinsing off the CHG Soap.  Pat yourself dry with a CLEAN TOWEL.  Wear CLEAN PAJAMAS to bed the night before surgery  Place CLEAN SHEETS on your bed the night before your surgery  DO NOT SLEEP WITH PETS.   Day of Surgery:  Take a shower with CHG soap. Wear Clean/Comfortable clothing the morning of surgery Do not apply any deodorants/lotions.   Remember to brush your teeth WITH YOUR REGULAR TOOTHPASTE.    If you received a COVID test during your pre-op visit, it is requested that you wear a mask when out in public, stay away from anyone that may not be feeling well, and notify your surgeon if you develop symptoms. If you have been in contact with anyone that has tested positive in the last 10 days, please notify your surgeon.    Please read over the following fact sheets that you were given.

## 2022-02-25 ENCOUNTER — Encounter (HOSPITAL_COMMUNITY)
Admission: RE | Admit: 2022-02-25 | Discharge: 2022-02-25 | Disposition: A | Discharge status: Home / Self Care | Payer: BC Managed Care – PPO | Source: Ambulatory Visit | Attending: Obstetrics and Gynecology | Admitting: Obstetrics and Gynecology

## 2022-02-25 ENCOUNTER — Encounter (HOSPITAL_COMMUNITY): Payer: Self-pay

## 2022-02-25 ENCOUNTER — Other Ambulatory Visit: Payer: Self-pay

## 2022-02-25 DIAGNOSIS — Z01818 Encounter for other preprocedural examination: Secondary | ICD-10-CM | POA: Diagnosis not present

## 2022-02-25 HISTORY — DX: Essential (primary) hypertension: I10

## 2022-02-25 LAB — COMPREHENSIVE METABOLIC PANEL
ALT: 19 U/L (ref 0–44)
AST: 20 U/L (ref 15–41)
Albumin: 3.6 g/dL (ref 3.5–5.0)
Alkaline Phosphatase: 107 U/L (ref 38–126)
Anion gap: 6 (ref 5–15)
BUN: 13 mg/dL (ref 6–20)
CO2: 26 mmol/L (ref 22–32)
Calcium: 8.9 mg/dL (ref 8.9–10.3)
Chloride: 106 mmol/L (ref 98–111)
Creatinine, Ser: 0.86 mg/dL (ref 0.44–1.00)
GFR, Estimated: >60 mL/min (ref >60)
Glucose, Bld: 110 mg/dL — ABNORMAL HIGH (ref 70–99)
Potassium: 3.7 mmol/L (ref 3.5–5.1)
Sodium: 138 mmol/L (ref 135–145)
Total Bilirubin: 0.1 mg/dL — ABNORMAL LOW (ref 0.3–1.2)
Total Protein: 7.2 g/dL (ref 6.5–8.1)

## 2022-02-25 LAB — TYPE AND SCREEN
ABO/RH(D): A POS
Antibody Screen: NEGATIVE

## 2022-02-25 LAB — CBC
HCT: 40.5 % (ref 36.0–46.0)
Hemoglobin: 12.1 g/dL (ref 12.0–15.0)
MCH: 23.9 pg — ABNORMAL LOW (ref 26.0–34.0)
MCHC: 29.9 g/dL — ABNORMAL LOW (ref 30.0–36.0)
MCV: 79.9 fL — ABNORMAL LOW (ref 80.0–100.0)
Platelets: 298 10*3/uL (ref 150–400)
RBC: 5.07 MIL/uL (ref 3.87–5.11)
RDW: 15.4 % (ref 11.5–15.5)
WBC: 6.7 10*3/uL (ref 4.0–10.5)
nRBC: 0 % (ref 0.0–0.2)

## 2022-02-25 NOTE — Progress Notes (Signed)
PCP - Dr. Kara Pacer Cardiologist - denies  PPM/ICD - n/a Device Orders - n/a Rep Notified - n/a  Chest x-ray - n/a EKG - 02/25/22 Stress Test - denies ECHO - denies Cardiac Cath - denies  Sleep Study - denies CPAP - n/a  Fasting Blood Sugar - n/a Checks Blood Sugar _____ times a day- n/a  Blood Thinner Instructions: n/a Aspirin Instructions: n/a  ERAS Protcol - Clear liquids until 09:30 A.M. day of surgery PRE-SURGERY Ensure or G2- None ordered  COVID TEST- n/a   Anesthesia review: EKG Review. BP 140/100 upon arrival to PAT. Patient states she gets nervous when at medical appointments. Patient left prior to having BP rechecked.   Patient denies shortness of breath, fever, cough and chest pain at PAT appointment   All instructions explained to the patient, with a verbal understanding of the material. Patient agrees to go over the instructions while at home for a better understanding. The opportunity to ask questions was provided.

## 2022-02-27 ENCOUNTER — Telehealth: Payer: Self-pay | Admitting: *Deleted

## 2022-02-27 NOTE — Telephone Encounter (Signed)
Called pt to let her know what Dr Mariann Barter about regarding her EKG and reaching out to PCP to determine if she needs a cardiologist to clear her for surgery.

## 2022-02-27 NOTE — Telephone Encounter (Signed)
-----   Message from Aletha Halim, MD sent at 02/26/2022  9:49 PM EDT ----- Can you let her know that her EKG is showing some abnormalities? I'm contacting her PCP to see if she needs to see cardiology for heart clearance before moving forward with surgery, so there's a possibility we may have to cancel her hysterectomy for next week. Thanks

## 2022-02-28 ENCOUNTER — Encounter: Payer: Self-pay | Admitting: Obstetrics and Gynecology

## 2022-02-28 NOTE — Progress Notes (Signed)
Anesthesia Chart Review:  Case: [id number] Date/Time: 03/04/22 1218   Procedures:      TOTAL LAPAROSCOPIC HYSTERECTOMY WITH SALPINGECTOMY (Bilateral)     CYSTOSCOPY   Anesthesia type: Choice   Pre-op diagnosis: AUB   Location: [facility] OR ROOM 07 / [place] OR   Surgeons: Aletha Halim, MD       DISCUSSION: Patient is a 41 year old female scheduled for the above procedure.  History includes never smoker, HTN, endometriosis, psoriatic arthritis, migraines, HELLP syndrome (2016). BMI is consistent with obesity.   BP 140/100 at PAT. She said she gets nervous at medical appointments. She reportedly left PAT prior to staff rechecking her BP. BP Readings from Last 3 Encounters:  02/25/22 (!) 140/100  01/23/22 (!) 155/105  12/23/21 (!) 155/104    Dr. Ilda Basset reached out to me regarding PAT EKG that showed NSR, septal infarct, but without significant change. He reached out to her PCP Dr. Silvio Pate who also felt EKG was stable and in the absence of symptoms did not think it represented a past MI or CAD. He did not feel inclined that she needed to see cardiology. He also reached out to [facility] APP who agreed that EKG was stable, but since last evaluation was in 2020 would have to defer decision on whether she would require a preoperative cardiology visit to her surgical team and PCP.   I called and spoke with patient. She has had a few ED visits over the years for chest pain. She says episodes have been attributed to panic attacks which she is better able to recognize now. Last CP visit was on 07/09/21. Chest pain was radiating and pleuritic in nature. EKG stable. D-dimer within normal limits. Serial troponins negative.  CXR without infiltrate. She was advised PCP follow-up. She also had cardiology evaluation by Dr. Garen Lah on 04/04/19 for chest pain but felt very atypical. Anxiety and/or reflux possibly contributing. HTN also needed better control and losartan prescribed. Would consider work-up if  persistent symptoms. Patient denies exertional chest pain. She says she has learned to recognize when she is having a panic attack and uses breathing technique and as needed Xanax. She denied SOB, edema, orthopena. Says she works in a [employer] and can go up 2 flights of stairs. Her parents live about a mile away and she often walks to their house. She is able to do yard work. These activities do not cause her chest pain or significant SOB. She does not check her BP at home regularly, but within the past few months she checked her BP at her grandparent with DBP in the 80's. She does not recall her SBP, but has not had any recent reading > 180. She is not a smoker or diabetic. She does not have any known CAD history. She thinks her Lillia Abed may have had some heart issues later on in life.   Given her lack of CV symptoms, stable EKG, and METS > 4, do not feel that she would require preoperative cardiology evaluation. We did however discuss that a significantly elevated BP on the day of surgery could lead to case delay or cancellation, so continue compliance with losartan per instructions. Since she does not keep on home BP log, it's difficult to confirm if her elevated readings are related to white coat hypertension. On periodic checks her DBP are reportedly in the 80's, so it does seem to be a consideration. Advised she obtain a home BP monitor and continue close PCP follow-up for HTN management. Discussed  she may take Xanax as prescribed on the morning of surgery if needed, since anxiety could also be playing a role in elevated BP readings.   Case also discussed with anesthesiologist Hoy Morn, MD and update sent to Dr. Ilda Basset.    VS: BP (!) 140/100   Pulse 92   Temp 37.2 C (Oral)   Resp 18   Ht '5\' 5"'  (1.651 m)   Wt 95.2 kg   LMP 10/21/2021 (Approximate)   SpO2 100%   BMI 34.91 kg/m    PROVIDERS: Venia Carbon, MD is PCP  - She saw cardiologist Kate Sable, MD on 04/04/19 for chest  pain evaluation. He felt her symptoms were very atypical. Main risk factor was HTN. Overall felt to be low risk for cardia disease. Reflux and/or anxiety could be contribution. Protonix and losartan prescribed. If symptoms persisted then may consider testing in the future.    LABS: Labs reviewed: Acceptable for surgery. (all labs ordered are listed, but only abnormal results are displayed)  Labs Reviewed  CBC - Abnormal; Notable for the following components:      Result Value   MCV 79.9 (*)    MCH 23.9 (*)    MCHC 29.9 (*)    All other components within normal limits  COMPREHENSIVE METABOLIC PANEL - Abnormal; Notable for the following components:   Glucose, Bld 110 (*)    Total Bilirubin 0.1 (*)    All other components within normal limits  TYPE AND SCREEN    IMAGES: CXR 07/08/21: FINDINGS: The heart size and mediastinal contours are within normal limits. Both lungs are clear. The visualized skeletal structures are unremarkable. IMPRESSION: No active cardiopulmonary disease.   EKG: 02/25/22: Normal sinus rhythm Possible Septal infarct , age undetermined Abnormal ECG When compared with ECG of 08-Jul-2021 21:51, PREVIOUS ECG IS PRESENT No significant change since last tracing Confirmed by Carlyle Dolly [phone]) on 02/27/2022 10:11:15 AM - EKG feels stable when compared to her EKGs dating back to at least when she saw cardiology on 04/04/19.   CV: Echocardiogram 10/13/14 ([facility]): INTERPRETATION  NORMAL LEFT VENTRICULAR SYSTOLIC FUNCTION  NORMAL RIGHT VENTRICULAR SYSTOLIC FUNCTION  NO VALVULAR REGURGITATION  NO VALVULAR STENOSIS  Closest EF: >55% (Estimated)  Contraction: Normal  Aortic: No AR  Mitral: No MR  Tricuspid: TRIVIAL TR     Past Medical History:  Diagnosis Date   Anxiety    Possible bipolar, Type II   Arthritis    Asthma    Bipolar disorder ([place])    BRCA negative 07/2017   vistaseq neg   Chronic headaches    Endometriosis 2018   Family history of  breast cancer    Hemorrhoids 08/2007   Hypertension    IBS (irritable bowel syndrome) 08/2007   Ileitis, 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 Hulen Skains   TUBAL LIGATION  2017   TYMPANOSTOMY TUBE PLACEMENT  MEDICATIONS:  albuterol (PROVENTIL HFA;VENTOLIN HFA) 108 (90 Base) MCG/ACT inhaler   ALPRAZolam (XANAX) 0.25 MG tablet   ARIPiprazole (ABILIFY) 5 MG tablet   cetirizine (ZYRTEC) 10 MG tablet   DULoxetine (CYMBALTA) 30 MG capsule   DULoxetine (CYMBALTA) 60 MG capsule   Fluticasone-Salmeterol (ADVAIR) 250-50 MCG/DOSE AEPB   ibuprofen (ADVIL) 200 MG tablet   losartan (COZAAR) 50 MG tablet   megestrol (MEGACE) 40 MG tablet   mometasone (ELOCON) 0.1 % ointment   montelukast (SINGULAIR) 10 MG tablet   promethazine-dextromethorphan (PROMETHAZINE-DM) 6.25-15 MG/5ML syrup   traMADol (ULTRAM) 50 MG tablet   No current facility-administered medications for this encounter.    Myra Gianotti, PA-C Surgical Short Stay/Anesthesiology [facility] Phone [phone] [facility] Phone [phone] 02/28/2022 11:41 AM

## 2022-02-28 NOTE — Anesthesia Preprocedure Evaluation (Signed)
Anesthesia Evaluation  Patient identified by MRN, date of birth, ID band Patient awake    Reviewed: Allergy & Precautions, NPO status , Patient's Chart, lab work & pertinent test results  Airway Mallampati: I  TM Distance: >3 FB Neck ROM: Full    Dental  (+) Teeth Intact, Dental Advisory Given   Pulmonary asthma ,  Rescue inhaler once a month    Pulmonary exam normal breath sounds clear to auscultation       Cardiovascular hypertension (145/100 in preop), Pt. on medications Normal cardiovascular exam Rhythm:Regular Rate:Normal     Neuro/Psych  Headaches, PSYCHIATRIC DISORDERS Anxiety Bipolar Disorder    GI/Hepatic negative GI ROS, Neg liver ROS,   Endo/Other  BMI 35  Renal/GU negative Renal ROS  Female GU complaint     Musculoskeletal  (+) Arthritis , Osteoarthritis,    Abdominal (+) + obese,   Peds  Hematology negative hematology ROS (+)   Anesthesia Other Findings   Reproductive/Obstetrics negative OB ROS                            Anesthesia Physical Anesthesia Plan  ASA: 3  Anesthesia Plan: General   Post-op Pain Management: Tylenol PO (pre-op)* and Toradol IV (intra-op)*   Induction: Intravenous  PONV Risk Score and Plan: 4 or greater and Ondansetron, Dexamethasone, Midazolam, Treatment may vary due to age or medical condition and Scopolamine patch - Pre-op  Airway Management Planned: Oral ETT  Additional Equipment: None  Intra-op Plan:   Post-operative Plan: Extubation in OR  Informed Consent: I have reviewed the patients History and Physical, chart, labs and discussed the procedure including the risks, benefits and alternatives for the proposed anesthesia with the patient or authorized representative who has indicated his/her understanding and acceptance.     Dental advisory given  Plan Discussed with: CRNA  Anesthesia Plan Comments: ( )       Anesthesia  Quick Evaluation

## 2022-03-04 ENCOUNTER — Encounter (HOSPITAL_COMMUNITY): Payer: Self-pay | Admitting: Obstetrics and Gynecology

## 2022-03-04 ENCOUNTER — Other Ambulatory Visit: Payer: Self-pay

## 2022-03-04 ENCOUNTER — Other Ambulatory Visit: Payer: Self-pay | Admitting: Obstetrics and Gynecology

## 2022-03-04 ENCOUNTER — Observation Stay (HOSPITAL_COMMUNITY)
Admission: RE | Admit: 2022-03-04 | Discharge: 2022-03-05 | Disposition: A | Discharge status: Home / Self Care | Payer: BC Managed Care – PPO | Attending: Obstetrics and Gynecology | Admitting: Obstetrics and Gynecology

## 2022-03-04 ENCOUNTER — Ambulatory Visit (HOSPITAL_COMMUNITY): Payer: BC Managed Care – PPO | Admitting: Certified Registered Nurse Anesthetist

## 2022-03-04 ENCOUNTER — Encounter (HOSPITAL_COMMUNITY)
Admission: RE | Disposition: A | Discharge status: Home / Self Care | Payer: Self-pay | Source: Home / Self Care | Attending: Obstetrics and Gynecology

## 2022-03-04 ENCOUNTER — Ambulatory Visit (HOSPITAL_COMMUNITY): Payer: BC Managed Care – PPO | Admitting: Physician Assistant

## 2022-03-04 DIAGNOSIS — J45909 Unspecified asthma, uncomplicated: Secondary | ICD-10-CM | POA: Diagnosis not present

## 2022-03-04 DIAGNOSIS — N736 Female pelvic peritoneal adhesions (postinfective): Secondary | ICD-10-CM | POA: Diagnosis not present

## 2022-03-04 DIAGNOSIS — N939 Abnormal uterine and vaginal bleeding, unspecified: Secondary | ICD-10-CM | POA: Diagnosis not present

## 2022-03-04 DIAGNOSIS — N8003 Adenomyosis of the uterus: Secondary | ICD-10-CM | POA: Diagnosis not present

## 2022-03-04 DIAGNOSIS — M1990 Unspecified osteoarthritis, unspecified site: Secondary | ICD-10-CM | POA: Diagnosis not present

## 2022-03-04 DIAGNOSIS — Z90710 Acquired absence of both cervix and uterus: Secondary | ICD-10-CM | POA: Diagnosis present

## 2022-03-04 DIAGNOSIS — N938 Other specified abnormal uterine and vaginal bleeding: Secondary | ICD-10-CM | POA: Diagnosis not present

## 2022-03-04 DIAGNOSIS — Z79899 Other long term (current) drug therapy: Secondary | ICD-10-CM | POA: Insufficient documentation

## 2022-03-04 DIAGNOSIS — I10 Essential (primary) hypertension: Secondary | ICD-10-CM | POA: Insufficient documentation

## 2022-03-04 DIAGNOSIS — N949 Unspecified condition associated with female genital organs and menstrual cycle: Secondary | ICD-10-CM | POA: Diagnosis not present

## 2022-03-04 DIAGNOSIS — Z01818 Encounter for other preprocedural examination: Secondary | ICD-10-CM

## 2022-03-04 DIAGNOSIS — N870 Mild cervical dysplasia: Secondary | ICD-10-CM | POA: Diagnosis not present

## 2022-03-04 HISTORY — PX: TOTAL LAPAROSCOPIC HYSTERECTOMY WITH SALPINGECTOMY: SHX6742

## 2022-03-04 HISTORY — DX: Abnormal uterine and vaginal bleeding, unspecified: N93.9

## 2022-03-04 HISTORY — PX: CYSTOSCOPY: SHX5120

## 2022-03-04 LAB — POCT PREGNANCY, URINE: Preg Test, Ur: NEGATIVE

## 2022-03-04 SURGERY — HYSTERECTOMY, TOTAL, LAPAROSCOPIC, WITH SALPINGECTOMY
Anesthesia: General | Site: Bladder

## 2022-03-04 MED ORDER — SODIUM CHLORIDE 0.9 % IR SOLN
Status: DC | PRN
  Administered 2022-03-04: 1

## 2022-03-04 MED ORDER — CEFAZOLIN SODIUM-DEXTROSE 2-4 GM/100ML-% IV SOLN
2.0000 g | Freq: Once | INTRAVENOUS | Status: AC
  Administered 2022-03-04: 2 g via INTRAVENOUS
  Filled 2022-03-04: qty 100

## 2022-03-04 MED ORDER — ROCURONIUM BROMIDE 10 MG/ML (PF) SYRINGE
PREFILLED_SYRINGE | INTRAVENOUS | Status: DC | PRN
  Administered 2022-03-04: 100 mg via INTRAVENOUS

## 2022-03-04 MED ORDER — BUPIVACAINE HCL 0.5 % IJ SOLN
INTRAMUSCULAR | Status: DC | PRN
  Administered 2022-03-04: 16 mL

## 2022-03-04 MED ORDER — KETOROLAC TROMETHAMINE 30 MG/ML IJ SOLN: 30.0000 mg | Freq: Once | INTRAMUSCULAR | Status: DC | PRN

## 2022-03-04 MED ORDER — ROCURONIUM BROMIDE 10 MG/ML (PF) SYRINGE
PREFILLED_SYRINGE | INTRAVENOUS | Status: AC
  Filled 2022-03-04: qty 40

## 2022-03-04 MED ORDER — PHENYLEPHRINE 80 MCG/ML (10ML) SYRINGE FOR IV PUSH (FOR BLOOD PRESSURE SUPPORT)
PREFILLED_SYRINGE | INTRAVENOUS | Status: AC
  Filled 2022-03-04: qty 30

## 2022-03-04 MED ORDER — PROPOFOL 10 MG/ML IV BOLUS
INTRAVENOUS | Status: DC | PRN
  Administered 2022-03-04: 200 mg via INTRAVENOUS

## 2022-03-04 MED ORDER — OXYCODONE HCL 5 MG PO TABS
5.0000 mg | ORAL_TABLET | ORAL | Status: DC | PRN
  Administered 2022-03-04 – 2022-03-05 (×3): 10 mg via ORAL
  Filled 2022-03-04 (×3): qty 2

## 2022-03-04 MED ORDER — HYDRALAZINE HCL 20 MG/ML IJ SOLN
INTRAMUSCULAR | Status: DC | PRN
  Administered 2022-03-04 (×2): 2.5 mg via INTRAVENOUS

## 2022-03-04 MED ORDER — SCOPOLAMINE 1 MG/3DAYS TD PT72
1.0000 | MEDICATED_PATCH | TRANSDERMAL | Status: DC
  Administered 2022-03-04: 1.5 mg via TRANSDERMAL

## 2022-03-04 MED ORDER — MIDAZOLAM HCL 2 MG/2ML IJ SOLN
INTRAMUSCULAR | Status: AC
  Filled 2022-03-04: qty 2

## 2022-03-04 MED ORDER — GABAPENTIN 100 MG PO CAPS
100.0000 mg | ORAL_CAPSULE | Freq: Two times a day (BID) | ORAL | Status: DC
  Administered 2022-03-04 – 2022-03-05 (×2): 100 mg via ORAL
  Filled 2022-03-04 (×2): qty 1

## 2022-03-04 MED ORDER — LOSARTAN POTASSIUM 50 MG PO TABS
50.0000 mg | ORAL_TABLET | Freq: Every day | ORAL | Status: DC
  Administered 2022-03-04: 50 mg via ORAL
  Filled 2022-03-04: qty 1

## 2022-03-04 MED ORDER — ONDANSETRON HCL 4 MG PO TABS: 4.0000 mg | ORAL_TABLET | Freq: Four times a day (QID) | ORAL | Status: DC | PRN

## 2022-03-04 MED ORDER — SCOPOLAMINE 1 MG/3DAYS TD PT72
MEDICATED_PATCH | TRANSDERMAL | Status: AC
  Filled 2022-03-04: qty 1

## 2022-03-04 MED ORDER — AMISULPRIDE (ANTIEMETIC) 5 MG/2ML IV SOLN: 10.0000 mg | Freq: Once | INTRAVENOUS | Status: DC | PRN

## 2022-03-04 MED ORDER — PANTOPRAZOLE SODIUM 40 MG IV SOLR
40.0000 mg | Freq: Every day | INTRAVENOUS | Status: DC
  Administered 2022-03-04: 40 mg via INTRAVENOUS
  Filled 2022-03-04: qty 10

## 2022-03-04 MED ORDER — FENTANYL CITRATE (PF) 250 MCG/5ML IJ SOLN
INTRAMUSCULAR | Status: AC
  Filled 2022-03-04: qty 5

## 2022-03-04 MED ORDER — KETOROLAC TROMETHAMINE 30 MG/ML IJ SOLN
INTRAMUSCULAR | Status: AC
  Filled 2022-03-04: qty 2

## 2022-03-04 MED ORDER — HYDROMORPHONE HCL 1 MG/ML IJ SOLN
INTRAMUSCULAR | Status: AC
  Filled 2022-03-04: qty 1

## 2022-03-04 MED ORDER — DEXAMETHASONE SODIUM PHOSPHATE 10 MG/ML IJ SOLN
INTRAMUSCULAR | Status: DC | PRN
  Administered 2022-03-04: 10 mg via INTRAVENOUS

## 2022-03-04 MED ORDER — FLUORESCEIN SODIUM 10 % IV SOLN
INTRAVENOUS | Status: DC | PRN
  Administered 2022-03-04: .25 mL via INTRAVENOUS

## 2022-03-04 MED ORDER — POLYETHYLENE GLYCOL 3350 17 G PO PACK
17.0000 g | PACK | Freq: Every day | ORAL | Status: DC
  Administered 2022-03-05: 17 g via ORAL
  Filled 2022-03-04: qty 1

## 2022-03-04 MED ORDER — LACTATED RINGERS IV SOLN: INTRAVENOUS | Status: DC | PRN

## 2022-03-04 MED ORDER — ONDANSETRON HCL 4 MG/2ML IJ SOLN
4.0000 mg | Freq: Four times a day (QID) | INTRAMUSCULAR | Status: DC | PRN
  Administered 2022-03-04: 4 mg via INTRAVENOUS
  Filled 2022-03-04: qty 2

## 2022-03-04 MED ORDER — DULOXETINE HCL 60 MG PO CPEP: 60.0000 mg | ORAL_CAPSULE | Freq: Every day | ORAL | Status: DC

## 2022-03-04 MED ORDER — MEPERIDINE HCL 25 MG/ML IJ SOLN: 6.2500 mg | INTRAMUSCULAR | Status: DC | PRN

## 2022-03-04 MED ORDER — OXYCODONE HCL 5 MG PO TABS: 5.0000 mg | ORAL_TABLET | Freq: Once | ORAL | Status: DC | PRN

## 2022-03-04 MED ORDER — ONDANSETRON HCL 4 MG/2ML IJ SOLN
INTRAMUSCULAR | Status: DC | PRN
  Administered 2022-03-04: 4 mg via INTRAVENOUS

## 2022-03-04 MED ORDER — EPHEDRINE 5 MG/ML INJ
INTRAVENOUS | Status: AC
  Filled 2022-03-04: qty 5

## 2022-03-04 MED ORDER — HYDROMORPHONE HCL 1 MG/ML IJ SOLN
INTRAMUSCULAR | Status: AC
  Filled 2022-03-04: qty 0.5

## 2022-03-04 MED ORDER — ACETAMINOPHEN 500 MG PO TABS
1000.0000 mg | ORAL_TABLET | Freq: Once | ORAL | Status: AC
  Administered 2022-03-04: 1000 mg via ORAL

## 2022-03-04 MED ORDER — MIDAZOLAM HCL 2 MG/2ML IJ SOLN
INTRAMUSCULAR | Status: DC | PRN
  Administered 2022-03-04: 2 mg via INTRAVENOUS

## 2022-03-04 MED ORDER — DEXAMETHASONE SODIUM PHOSPHATE 10 MG/ML IJ SOLN
INTRAMUSCULAR | Status: AC
  Filled 2022-03-04: qty 4

## 2022-03-04 MED ORDER — LABETALOL HCL 5 MG/ML IV SOLN
INTRAVENOUS | Status: AC
  Filled 2022-03-04: qty 4

## 2022-03-04 MED ORDER — ALBUTEROL SULFATE (2.5 MG/3ML) 0.083% IN NEBU: 2.5000 mg | INHALATION_SOLUTION | Freq: Four times a day (QID) | RESPIRATORY_TRACT | Status: DC | PRN

## 2022-03-04 MED ORDER — OXYCODONE HCL 5 MG/5ML PO SOLN: 5.0000 mg | Freq: Once | ORAL | Status: DC | PRN

## 2022-03-04 MED ORDER — CHLORHEXIDINE GLUCONATE 0.12 % MT SOLN
15.0000 mL | Freq: Once | OROMUCOSAL | Status: AC
  Administered 2022-03-04: 15 mL via OROMUCOSAL
  Filled 2022-03-04: qty 15

## 2022-03-04 MED ORDER — PROPOFOL 10 MG/ML IV BOLUS
INTRAVENOUS | Status: AC
  Filled 2022-03-04: qty 20

## 2022-03-04 MED ORDER — SODIUM CHLORIDE 0.9 % IV SOLN
INTRAVENOUS | Status: DC
  Administered 2022-03-04: 1000 mL via INTRAVENOUS

## 2022-03-04 MED ORDER — LIDOCAINE 2% (20 MG/ML) 5 ML SYRINGE
INTRAMUSCULAR | Status: DC | PRN
  Administered 2022-03-04: 80 mg via INTRAVENOUS

## 2022-03-04 MED ORDER — HYDROMORPHONE HCL 1 MG/ML IJ SOLN
INTRAMUSCULAR | Status: DC | PRN
  Administered 2022-03-04: .5 mg via INTRAVENOUS

## 2022-03-04 MED ORDER — SIMETHICONE 80 MG PO CHEW
80.0000 mg | CHEWABLE_TABLET | Freq: Three times a day (TID) | ORAL | Status: DC
  Administered 2022-03-04 – 2022-03-05 (×3): 80 mg via ORAL
  Filled 2022-03-04 (×3): qty 1

## 2022-03-04 MED ORDER — MOMETASONE FURO-FORMOTEROL FUM 200-5 MCG/ACT IN AERO
2.0000 | INHALATION_SPRAY | Freq: Two times a day (BID) | RESPIRATORY_TRACT | Status: DC
  Administered 2022-03-04 – 2022-03-05 (×2): 2 via RESPIRATORY_TRACT
  Filled 2022-03-04: qty 8.8

## 2022-03-04 MED ORDER — LIDOCAINE 2% (20 MG/ML) 5 ML SYRINGE
INTRAMUSCULAR | Status: AC
  Filled 2022-03-04: qty 20

## 2022-03-04 MED ORDER — HYDROMORPHONE HCL 1 MG/ML IJ SOLN
0.5000 mg | INTRAMUSCULAR | Status: DC | PRN
  Administered 2022-03-04: 0.5 mg via INTRAVENOUS
  Filled 2022-03-04: qty 0.5

## 2022-03-04 MED ORDER — ORAL CARE MOUTH RINSE: 15.0000 mL | Freq: Once | OROMUCOSAL | Status: AC

## 2022-03-04 MED ORDER — LACTATED RINGERS IV SOLN: INTRAVENOUS | Status: DC

## 2022-03-04 MED ORDER — ACETAMINOPHEN 500 MG PO TABS
ORAL_TABLET | ORAL | Status: AC
  Filled 2022-03-04: qty 2

## 2022-03-04 MED ORDER — ONDANSETRON HCL 4 MG/2ML IJ SOLN: 4.0000 mg | Freq: Once | INTRAMUSCULAR | Status: DC | PRN

## 2022-03-04 MED ORDER — ONDANSETRON HCL 4 MG/2ML IJ SOLN
INTRAMUSCULAR | Status: AC
  Filled 2022-03-04: qty 8

## 2022-03-04 MED ORDER — ARIPIPRAZOLE 10 MG PO TABS
5.0000 mg | ORAL_TABLET | Freq: Every day | ORAL | Status: DC
  Administered 2022-03-04: 5 mg via ORAL
  Filled 2022-03-04: qty 1

## 2022-03-04 MED ORDER — DULOXETINE HCL 30 MG PO CPEP
90.0000 mg | ORAL_CAPSULE | Freq: Every day | ORAL | Status: DC
  Administered 2022-03-05: 90 mg via ORAL
  Filled 2022-03-04: qty 3

## 2022-03-04 MED ORDER — HYDROMORPHONE HCL 1 MG/ML IJ SOLN
0.2500 mg | INTRAMUSCULAR | Status: DC | PRN
  Administered 2022-03-04: 0.5 mg via INTRAVENOUS

## 2022-03-04 MED ORDER — LORATADINE 10 MG PO TABS
10.0000 mg | ORAL_TABLET | Freq: Every day | ORAL | Status: DC
  Administered 2022-03-05: 10 mg via ORAL
  Filled 2022-03-04: qty 1

## 2022-03-04 MED ORDER — HYDRALAZINE HCL 20 MG/ML IJ SOLN
INTRAMUSCULAR | Status: AC
  Filled 2022-03-04: qty 1

## 2022-03-04 MED ORDER — FENTANYL CITRATE (PF) 250 MCG/5ML IJ SOLN
INTRAMUSCULAR | Status: DC | PRN
  Administered 2022-03-04: 50 ug via INTRAVENOUS
  Administered 2022-03-04: 100 ug via INTRAVENOUS
  Administered 2022-03-04 (×2): 50 ug via INTRAVENOUS

## 2022-03-04 MED ORDER — BUPIVACAINE HCL (PF) 0.5 % IJ SOLN
INTRAMUSCULAR | Status: AC
  Filled 2022-03-04: qty 30

## 2022-03-04 MED ORDER — KETOROLAC TROMETHAMINE 30 MG/ML IJ SOLN
INTRAMUSCULAR | Status: DC | PRN
  Administered 2022-03-04: 30 mg via INTRAVENOUS

## 2022-03-04 MED ORDER — IBUPROFEN 600 MG PO TABS
600.0000 mg | ORAL_TABLET | Freq: Four times a day (QID) | ORAL | Status: DC
  Administered 2022-03-05 (×2): 600 mg via ORAL
  Filled 2022-03-04 (×2): qty 1

## 2022-03-04 MED ORDER — SUCCINYLCHOLINE CHLORIDE 200 MG/10ML IV SOSY
PREFILLED_SYRINGE | INTRAVENOUS | Status: AC
  Filled 2022-03-04: qty 10

## 2022-03-04 MED ORDER — SUGAMMADEX SODIUM 200 MG/2ML IV SOLN
INTRAVENOUS | Status: DC | PRN
  Administered 2022-03-04: 180 mg via INTRAVENOUS

## 2022-03-04 SURGICAL SUPPLY — 45 items
ADH SKN CLS APL DERMABOND .7 (GAUZE/BANDAGES/DRESSINGS) ×2
BLADE SURG 15 STRL LF DISP TIS (BLADE) ×2 IMPLANT
BLADE SURG 15 STRL SS (BLADE) ×2
DEFOGGER SCOPE WARMER CLEARIFY (MISCELLANEOUS) ×2 IMPLANT
DERMABOND ADVANCED .7 DNX12 (GAUZE/BANDAGES/DRESSINGS) ×2 IMPLANT
DEVICE SUTURE ENDOST 10MM (ENDOMECHANICALS) IMPLANT
DURAPREP 26ML APPLICATOR (WOUND CARE) ×2 IMPLANT
GLOVE BIOGEL PI IND STRL 7.5 (GLOVE) ×4 IMPLANT
GLOVE PI ORTHO PRO STRL SZ8 (GLOVE) IMPLANT
GLOVE SURG SS PI 7.0 STRL IVOR (GLOVE) ×8 IMPLANT
GLOVE SURG UNDER POLY LF SZ7 (GLOVE) ×4 IMPLANT
GOWN SPEC L3 XXLG W/TWL (GOWN DISPOSABLE) IMPLANT
GOWN STRL REUS W/ TWL XL LVL3 (GOWN DISPOSABLE) ×4 IMPLANT
GOWN STRL REUS W/TWL XL LVL3 (GOWN DISPOSABLE) ×4
HIBICLENS CHG 4% 4OZ BTL (MISCELLANEOUS) ×2 IMPLANT
IRRIG SUCT STRYKERFLOW 2 WTIP (MISCELLANEOUS) ×2
IRRIGATION SUCT STRKRFLW 2 WTP (MISCELLANEOUS) ×2 IMPLANT
KIT TURNOVER KIT B (KITS) ×2 IMPLANT
L-HOOK LAP DISP 36CM (ELECTROSURGICAL) ×2
LHOOK LAP DISP 36CM (ELECTROSURGICAL) IMPLANT
LIGASURE VESSEL 5MM BLUNT TIP (ELECTROSURGICAL) IMPLANT
MANIFOLD NEPTUNE II (INSTRUMENTS) ×2 IMPLANT
MANIPULATOR VCARE STD CRV RETR (MISCELLANEOUS) IMPLANT
OCCLUDER COLPOPNEUMO (BALLOONS) ×2 IMPLANT
PACK LAPAROSCOPY BASIN (CUSTOM PROCEDURE TRAY) ×2 IMPLANT
PACK TRENDGUARD 450 HYBRID PRO (MISCELLANEOUS) ×2 IMPLANT
PAD OB MATERNITY 4.3X12.25 (PERSONAL CARE ITEMS) ×2 IMPLANT
PENCIL SMOKE EVACUATOR (MISCELLANEOUS) ×2 IMPLANT
POUCH LAPAROSCOPIC INSTRUMENT (MISCELLANEOUS) ×2 IMPLANT
SET IRRIG Y TYPE TUR BLADDER L (SET/KITS/TRAYS/PACK) ×2 IMPLANT
SET TUBE SMOKE EVAC HIGH FLOW (TUBING) ×2 IMPLANT
SUT ENDO VLOC 180-0-8IN (SUTURE) IMPLANT
SUT MNCRL AB 4-0 PS2 18 (SUTURE) IMPLANT
SUT VICRYL 0 UR6 27IN ABS (SUTURE) IMPLANT
SYR 10ML LL (SYRINGE) ×2 IMPLANT
SYR 50ML LL SCALE MARK (SYRINGE) ×2 IMPLANT
SYSTEM CARTER THOMASON II (TROCAR) IMPLANT
TOWEL GREEN STERILE FF (TOWEL DISPOSABLE) ×2 IMPLANT
TRAY FOLEY W/BAG SLVR 16FR (SET/KITS/TRAYS/PACK) ×2
TRAY FOLEY W/BAG SLVR 16FR ST (SET/KITS/TRAYS/PACK) ×2 IMPLANT
TRENDGUARD 450 HYBRID PRO PACK (MISCELLANEOUS) ×2
TROCAR 11X100 Z THREAD (TROCAR) IMPLANT
TROCAR ADV FIXATION 5X100MM (TROCAR) IMPLANT
TROCAR BALLN 12MMX100 BLUNT (TROCAR) IMPLANT
UNDERPAD 30X36 HEAVY ABSORB (UNDERPADS AND DIAPERS) ×2 IMPLANT

## 2022-03-04 NOTE — Brief Op Note (Signed)
03/04/2022  3:27 PM  PATIENT:  Bridget Maldonado  41 y.o. female  PRE-OPERATIVE DIAGNOSIS:   Abnormal Uterine Bleeding  POST-OPERATIVE DIAGNOSIS:  Abnormal Uterine Bleeding  PROCEDURE:  Procedure(s): TOTAL LAPAROSCOPIC HYSTERECTOMY WITH SALPINGECTOMY (Bilateral) CYSTOSCOPY (N/A)  SURGEON:  Surgeon(s) and Role:    * Aletha Halim, MD - Primary  ASSISTANTS:    Griffin Basil, MD - Assisting  ANESTHESIA:   local and general  EBL:  20 mL   IVF: 1619m  BLOOD ADMINISTERED:none  DRAINS:  indwelling foley 1559mUOP (removed at the end of the case)    LOCAL MEDICATIONS USED:  MARCAINE     SPECIMEN:  cervix, uterus and part of left fallopian tube and corresponding filshie clip  DISPOSITION OF SPECIMEN:  PATHOLOGY  COUNTS:  YES  TOURNIQUET:  * No tourniquets in log *  DICTATION: .Dragon Dictation  PLAN OF CARE: Admit for overnight observation  PATIENT DISPOSITION:  PACU - hemodynamically stable.   Delay start of Pharmacological VTE agent (>24hrs) due to surgical blood loss or risk of bleeding: not applicable  ChDurene RomansD Attending [facility]) 03/04/2022 Time: [id number]

## 2022-03-04 NOTE — Anesthesia Procedure Notes (Signed)
Procedure Name: Intubation Date/Time: 03/04/2022 1:13 PM  Performed by: Darletta Moll, CRNAPre-anesthesia Checklist: Patient identified, Emergency Drugs available, Suction available and Patient being monitored Patient Re-evaluated:Patient Re-evaluated prior to induction Oxygen Delivery Method: Circle system utilized Preoxygenation: Pre-oxygenation with 100% oxygen Induction Type: IV induction Ventilation: Mask ventilation without difficulty Laryngoscope Size: Mac and 3 Grade View: Grade I Tube type: Oral Tube size: 7.0 mm Number of attempts: 1 Airway Equipment and Method: Stylet and Oral airway Placement Confirmation: ETT inserted through vocal cords under direct vision, positive ETCO2 and breath sounds checked- equal and bilateral Secured at: 21 cm Tube secured with: Tape Dental Injury: Teeth and Oropharynx as per pre-operative assessment

## 2022-03-04 NOTE — H&P (View-Only) (Signed)
Obstetrics & Gynecology Surgical H&P   Date of Surgery: 03/04/2022    Primary OBGYN: [facility]  Reason for Admission: scheduled hysterectomy  History of Present Illness: Bridget Maldonado is a 41 y.o. G2P1011 (Patient's last menstrual period was 10/21/2021 (approximate).), with the above CC. PMHx is significant for HTN, asthma, migraines, anxiety/depression, BMI 35, psoriasis, h/o c-section x 1 and BTL, h/o l/s chole  Patient seen by me for nex patient exam on early July 2023 and had new onset qday AUB starting early this year, which was also painful, with no relief from megace.   During her visit with me, she had a slightly abnormal pap (plan for one year repeat) and benign inactive endometrium on embx.   ROS: A 12-point review of systems was performed and negative, except as stated in the above HPI.  OBGYN History: As per HPI. OB History  Gravida Para Term Preterm AB Living  2 1 1   1 1  SAB IAB Ectopic Multiple Live Births    1          # Outcome Date GA Lbr Len/2nd Weight Sex Delivery Anes PTL Lv  2 IAB           1 Term             Obstetric Comments  Menstrual age: 19    Age 1st Pregnancy: 33     Past Medical History: Past Medical History:  Diagnosis Date   Anxiety    Possible bipolar, Type II   Arthritis    Asthma    Bipolar disorder (HCC)    BRCA negative 07/2017   vistaseq neg   Chronic headaches    Endometriosis 2018   Family history of breast cancer    Hemorrhoids 08/2007   Hypertension    IBS (irritable bowel syndrome) 08/2007   Ileitis, 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 Wyatt   TUBAL LIGATION  2017   TYMPANOSTOMY TUBE PLACEMENT      Family History:  Family History  Problem Relation Age of Onset   Colon polyps Maternal Grandfather    Parkinson's disease Maternal Grandfather    Breast cancer Mother 29   Rheum arthritis Mother    Irritable bowel syndrome Mother    Asthma Father    Anxiety disorder Father        Severe, possible bipolar   Irritable bowel syndrome Maternal Grandmother    Diabetes Other        mat. great uncles   Alzheimer's disease Other        Dad's side   Colon cancer Other        mat GGF    Social History:  Social History   Socioeconomic History   Marital status: Married    Spouse name: Not on file   Number of children: 1   Years of   education: Not on file   Highest education level: Not on file  Occupational History   Occupation: Administration    Employer: [employer]  Tobacco Use   Smoking status: Never    Passive exposure: Past   Smokeless tobacco: Never  Vaping Use   Vaping Use: Never used  Substance and Sexual Activity   Alcohol use: No    Alcohol/week: 0.0 standard drinks of alcohol   Drug use: No   Sexual activity: Yes    Birth control/protection: Surgical  Other Topics Concern   Not on file  Social History Narrative   Married 10/22   Social Determinants of Health   Financial Resource Strain: Not on file  Food Insecurity: Not on file  Transportation Needs: Not on file  Physical Activity: Not on file  Stress: Not on file  Social Connections: Not on file  Intimate Partner Violence: Not on file    Allergy: Allergies  Allergen Reactions   Sulfa Antibiotics Nausea And Vomiting   Cipro [Ciprofloxacin Hcl] Nausea And Vomiting   Effexor [Venlafaxine Hcl] Other (See Comments)    Excessive sleeping, slept for 2 days    Current Outpatient Medications: Current Outpatient  Medications  Medication Sig Dispense Refill   albuterol (PROVENTIL HFA;VENTOLIN HFA) 108 (90 Base) MCG/ACT inhaler Inhale 2 puffs into the lungs every 6 (six) hours as needed for wheezing or shortness of breath. 1 Inhaler 1   ALPRAZolam (XANAX) 0.25 MG tablet Take 1 tablet (0.25 mg total) by mouth 2 (two) times daily as needed for anxiety. 60 tablet 0   ARIPiprazole (ABILIFY) 5 MG tablet Take 1 tablet (5 mg total) by mouth at bedtime. 90 tablet 3   cetirizine (ZYRTEC) 10 MG tablet Take 1 tablet (10 mg total) by mouth daily. (Patient taking differently: Take 10 mg by mouth daily as needed for allergies.) 30 tablet 0   DULoxetine (CYMBALTA) 30 MG capsule Take 1 capsule (30 mg total) by mouth daily. 30 capsule 3   DULoxetine (CYMBALTA) 60 MG capsule Take 1 capsule (60 mg total) by mouth daily. 90 capsule 3   Fluticasone-Salmeterol (ADVAIR) 250-50 MCG/DOSE AEPB Inhale 1 puff into the lungs 2 (two) times daily. 180 each 3   ibuprofen (ADVIL) 200 MG tablet Take 800 mg by mouth daily as needed for headache, cramping or mild pain.     losartan (COZAAR) 50 MG tablet Take 1 tablet (50 mg total) by mouth daily. 90 tablet 3   megestrol (MEGACE) 40 MG tablet 1-2 tab po qday prn (Patient taking differently: Take 40 mg by mouth daily as needed (to stop menstrual bleeding).) 20 tablet 0   mometasone (ELOCON) 0.1 % ointment APPLY TWICE DAILY AS NEEDED FOR PSORIASIS (Patient taking differently: Apply 1 application  topically 2 (two) times daily as needed (psoriasis).) 45 g 1   montelukast (SINGULAIR) 10 MG tablet Take 1 tablet (10 mg total) by mouth at bedtime. (Patient taking differently: Take 10 mg by mouth daily as needed (allergies).) 90 tablet 3   promethazine-dextromethorphan (PROMETHAZINE-DM) 6.25-15 MG/5ML syrup Take 5 mLs by mouth at bedtime as needed for cough. (Patient not taking: Reported on 12/23/2021) 60 mL 0   traMADol (ULTRAM) 50 MG tablet Take 1 tablet (50 mg total) by mouth 2 (two) times daily as  needed. (Patient taking differently: Take 50 mg by mouth 2 (two) times daily as needed (pain).) 30 tablet 0   No current facility-administered medications for this visit.     Physical Exam: From 01/23/22 BP (!) 155/105     Pulse (!) 102   Wt 213 lb (96.6 kg)   BMI 35.45 kg/m  Body mass index is 35.45 kg/m.   General appearance: Well nourished, well developed female in no acute distress.  Abdomen: obese, soft, nttp   From 7/3 Pelvic exam: is not limited by body habitus; no e/o atrophy EGBUS: within normal limits Vagina: within normal limits and with no blood or discharge in the vault Cervix: normal appearing cervix without tenderness, discharge or lesions.  Uterus:  nonenlarged and non tender Adnexa:  normal adnexa and no mass, fullness, tenderness Rectovaginal: deferred  Exam from 12/23/21 Pelvic exam: is not limited by body habitus; no e/o atrophy EGBUS: within normal limits Vagina: within normal limits and with no blood or discharge in the vault Cervix: normal appearing cervix without tenderness, discharge or lesions.  Uterus:  nonenlarged and non tender Adnexa:  normal adnexa and no mass, fullness, tenderness Rectovaginal: deferred   Laboratory: Recent Labs  Lab 02/25/22 1350  WBC 6.7  HGB 12.1  HCT 40.5  PLT 298   Recent Labs  Lab 02/25/22 1350  NA 138  K 3.7  CL 106  CO2 26  BUN 13  CREATININE 0.86  CALCIUM 8.9  PROT 7.2  BILITOT 0.1*  ALKPHOS 107  ALT 19  AST 20  GLUCOSE 110*   No results for input(s): "APTT", "INR", "PTT" in the last 168 hours.  Invalid input(s): "DRHAPTT" Recent Labs  Lab 02/25/22 1350  [facility] A POS    Imaging:  Narrative & Impression  CLINICAL DATA:  Pelvic pain and heavy vaginal bleeding x5 months.   EXAM: TRANSABDOMINAL AND TRANSVAGINAL ULTRASOUND OF PELVIS   DOPPLER ULTRASOUND OF OVARIES   TECHNIQUE: Both transabdominal and transvaginal ultrasound examinations of the pelvis were performed. Transabdominal  technique was performed for global imaging of the pelvis including uterus, ovaries, adnexal regions, and pelvic cul-de-sac.   It was necessary to proceed with endovaginal exam following the transabdominal exam to visualize the uterus, endometrium, bilateral ovaries and bilateral adnexa. Color and duplex Doppler ultrasound was utilized to evaluate blood flow to the ovaries.   COMPARISON:  October 03, 2021   FINDINGS: Uterus   Measurements: 9.9 cm x 5.5 cm x 5.9 cm = volume: 167.8 mL. No fibroids or other mass visualized.   Endometrium   Thickness: 12.4 mm.  No focal abnormality visualized.   Right ovary   The right ovary is not visualized.   Left ovary   Measurements: 2.5 cm x 2.1 cm x 2.3 cm = volume: 6.2 mL. Limited in evaluation secondary to overlying bowel gas. A 2.3 cm x 2.8 cm x 2.7 cm anechoic structure is seen adjacent to the left ovary. No abnormal flow is seen within this region on color Doppler evaluation.   Pulsed Doppler evaluation of the RIGHT ovary demonstrates normal low-resistance arterial and venous waveforms.   Other findings   It should be noted that the study is limited in evaluation secondary to overlying bowel gas and a partially filled urinary bladder.   IMPRESSION: 1. Limited study, as described above, without visualization of the right ovary. 2. Thickened, mildly heterogeneous endometrium. Sequelae associated with endometrial hyperplasia cannot be excluded. Gynecology consult with subsequent sonohysterogram should be considered for focal lesion work-up. (Ref: Radiological reasoning: Algorithmic Workup of Abnormal Vaginal Bleeding with endovaginal Sonography and Sonohysterography. AJR 2008; 191:S68-73). 3. Simple left para ovarian cyst.     Electronically Signed   By: Thaddeus  Houston M.D.   On: 11/14/2021 01:27      Assessment: Bridget Maldonado is a 41 y.o. G2P1011 (Patient's last menstrual period was 10/21/2021 (approximate).) here for  scheduled hysterectomy; patient stable  Plan: Plan to proceed with TLH/BS/cysto    Bridget Maldonado, Jr. MD Attending [facility]   

## 2022-03-04 NOTE — H&P (Signed)
Obstetrics & Gynecology Surgical H&P   Date of Surgery: 03/04/2022    [facility]: [facility]  Reason for Admission: scheduled hysterectomy  History of Present Illness: [name] is a 41 y.o. G2P1011 (Patient's last menstrual period was 10/21/2021 (approximate).), with the above CC. PMHx is significant for HTN, asthma, migraines, anxiety/depression, BMI 35, psoriasis, h/o c-section x 1 and BTL, h/o l/s chole  Patient seen by me for nex patient exam on early July 2023 and had new onset qday AUB starting early this year, which was also painful, with no relief from megace.   During her visit with me, she had a slightly abnormal pap (plan for one year repeat) and benign inactive endometrium on embx.   ROS: A 12-point review of systems was performed and negative, except as stated in the above HPI.  OBGYN History: As per HPI. OB History  Gravida Para Term Preterm AB Living  _0 SAB IAB Ectopic Multiple Live Births    1          # Outcome Date GA Lbr Len/2nd Weight Sex Delivery Anes PTL Lv  2 IAB           1 Term             Obstetric Comments  Menstrual age: 74    Age 1st Pregnancy: 66     Past Medical History: Past Medical History:  Diagnosis Date   Anxiety    Possible bipolar, Type II   Arthritis    Asthma    Bipolar disorder ([place])    BRCA negative 07/2017   vistaseq neg   Chronic headaches    Endometriosis 2018   Family history of breast cancer    Hemorrhoids 08/2007   Hypertension    IBS (irritable bowel syndrome) 08/2007   Ileitis, terminal ([place]) 08/2007   Increased risk of breast cancer 07/2017   IBIS=40%   Kidney infection    Pylonephritis when pregnant   LGSIL on Pap smear of cervix    Migraines    Obese    Psoriatic arthritis ([place])     Past Surgical History: Past Surgical History:  Procedure Laterality Date   BREAST BIOPSY Left 8 years   CESAREAN SECTION     COLONOSCOPY     COLONOSCOPY W/ BIOPSIES      EAR TUBE REMOVAL     ENDOMETRIAL BIOPSY  12/23/2021   ESOPHAGOGASTRODUODENOSCOPY     LAPAROSCOPIC CHOLECYSTECTOMY  6/09   Dr. Hulen Skains   TUBAL LIGATION  2017   TYMPANOSTOMY TUBE PLACEMENT      Family History:  Family History  Problem Relation Age of Onset   Colon polyps Maternal Grandfather    Parkinson's disease Maternal Grandfather    Breast cancer Mother 40   Rheum arthritis Mother    Irritable bowel syndrome Mother    Asthma Father    Anxiety disorder Father        Severe, possible bipolar   Irritable bowel syndrome Maternal Grandmother    Diabetes Other        mat. great uncles   Alzheimer's disease Other        Dad's side   Colon cancer Other        mat GGF    Social History:  Social History   Socioeconomic History   Marital status: Married    Spouse name: Not on file   Number of children: 1   Years of  education: Not on file   Highest education level: Not on file  Occupational History   Occupation: Administration    Employer: [employer]  Tobacco Use   Smoking status: Never    Passive exposure: Past   Smokeless tobacco: Never  Vaping Use   Vaping Use: Never used  Substance and Sexual Activity   Alcohol use: No    Alcohol/week: 0.0 standard drinks of alcohol   Drug use: No   Sexual activity: Yes    Birth control/protection: Surgical  Other Topics Concern   Not on file  Social History Narrative   Married 10/22   Social Determinants of Health   Financial Resource Strain: Not on file  Food Insecurity: Not on file  Transportation Needs: Not on file  Physical Activity: Not on file  Stress: Not on file  Social Connections: Not on file  Intimate Partner Violence: Not on file    Allergy: Allergies  Allergen Reactions   Sulfa Antibiotics Nausea And Vomiting   Cipro [Ciprofloxacin Hcl] Nausea And Vomiting   Effexor [Venlafaxine Hcl] Other (See Comments)    Excessive sleeping, slept for 2 days    Current Outpatient Medications: Current Outpatient  Medications  Medication Sig Dispense Refill   albuterol (PROVENTIL HFA;VENTOLIN HFA) 108 (90 Base) MCG/ACT inhaler Inhale 2 puffs into the lungs every 6 (six) hours as needed for wheezing or shortness of breath. 1 Inhaler 1   ALPRAZolam (XANAX) 0.25 MG tablet Take 1 tablet (0.25 mg total) by mouth 2 (two) times daily as needed for anxiety. 60 tablet 0   ARIPiprazole (ABILIFY) 5 MG tablet Take 1 tablet (5 mg total) by mouth at bedtime. 90 tablet 3   cetirizine (ZYRTEC) 10 MG tablet Take 1 tablet (10 mg total) by mouth daily. (Patient taking differently: Take 10 mg by mouth daily as needed for allergies.) 30 tablet 0   DULoxetine (CYMBALTA) 30 MG capsule Take 1 capsule (30 mg total) by mouth daily. 30 capsule 3   DULoxetine (CYMBALTA) 60 MG capsule Take 1 capsule (60 mg total) by mouth daily. 90 capsule 3   Fluticasone-Salmeterol (ADVAIR) 250-50 MCG/DOSE AEPB Inhale 1 puff into the lungs 2 (two) times daily. 180 each 3   ibuprofen (ADVIL) 200 MG tablet Take 800 mg by mouth daily as needed for headache, cramping or mild pain.     losartan (COZAAR) 50 MG tablet Take 1 tablet (50 mg total) by mouth daily. 90 tablet 3   megestrol (MEGACE) 40 MG tablet 1-2 tab po qday prn (Patient taking differently: Take 40 mg by mouth daily as needed (to stop menstrual bleeding).) 20 tablet 0   mometasone (ELOCON) 0.1 % ointment APPLY TWICE DAILY AS NEEDED FOR PSORIASIS (Patient taking differently: Apply 1 application  topically 2 (two) times daily as needed (psoriasis).) 45 g 1   montelukast (SINGULAIR) 10 MG tablet Take 1 tablet (10 mg total) by mouth at bedtime. (Patient taking differently: Take 10 mg by mouth daily as needed (allergies).) 90 tablet 3   promethazine-dextromethorphan (PROMETHAZINE-DM) 6.25-15 MG/5ML syrup Take 5 mLs by mouth at bedtime as needed for cough. (Patient not taking: Reported on 12/23/2021) 60 mL 0   traMADol (ULTRAM) 50 MG tablet Take 1 tablet (50 mg total) by mouth 2 (two) times daily as  needed. (Patient taking differently: Take 50 mg by mouth 2 (two) times daily as needed (pain).) 30 tablet 0   No current facility-administered medications for this visit.     Physical Exam: From 01/23/22 BP (!) 155/105  Pulse (!) 102   Wt 213 lb (96.6 kg)   BMI 35.45 kg/m  Body mass index is 35.45 kg/m.   General appearance: Well nourished, well developed female in no acute distress.  Abdomen: obese, soft, nttp   From 7/3 Pelvic exam: is not limited by body habitus; no e/o atrophy EGBUS: within normal limits Vagina: within normal limits and with no blood or discharge in the vault Cervix: normal appearing cervix without tenderness, discharge or lesions.  Uterus:  nonenlarged and non tender Adnexa:  normal adnexa and no mass, fullness, tenderness Rectovaginal: deferred  Exam from 12/23/21 Pelvic exam: is not limited by body habitus; no e/o atrophy EGBUS: within normal limits Vagina: within normal limits and with no blood or discharge in the vault Cervix: normal appearing cervix without tenderness, discharge or lesions.  Uterus:  nonenlarged and non tender Adnexa:  normal adnexa and no mass, fullness, tenderness Rectovaginal: deferred   Laboratory: Recent Labs  Lab 02/25/22 1350  WBC 6.7  HGB 12.1  HCT 40.5  PLT 298   Recent Labs  Lab 02/25/22 1350  NA 138  K 3.7  CL 106  CO2 26  BUN 13  CREATININE 0.86  CALCIUM 8.9  PROT 7.2  BILITOT 0.1*  ALKPHOS 107  ALT 19  AST 20  GLUCOSE 110*   No results for input(s): "APTT", "INR", "PTT" in the last 168 hours.  Invalid input(s): "DRHAPTT" Recent Labs  Lab 02/25/22 1350  [place] A POS    Imaging:  Narrative & Impression  CLINICAL DATA:  Pelvic pain and heavy vaginal bleeding x5 months.   EXAM: TRANSABDOMINAL AND TRANSVAGINAL ULTRASOUND OF PELVIS   DOPPLER ULTRASOUND OF OVARIES   TECHNIQUE: Both transabdominal and transvaginal ultrasound examinations of the pelvis were performed. Transabdominal  technique was performed for global imaging of the pelvis including uterus, ovaries, adnexal regions, and pelvic cul-de-sac.   It was necessary to proceed with endovaginal exam following the transabdominal exam to visualize the uterus, endometrium, bilateral ovaries and bilateral adnexa. Color and duplex Doppler ultrasound was utilized to evaluate blood flow to the ovaries.   COMPARISON:  October 03, 2021   FINDINGS: Uterus   Measurements: 9.9 cm x 5.5 cm x 5.9 cm = volume: 167.8 mL. No fibroids or other mass visualized.   Endometrium   Thickness: 12.4 mm.  No focal abnormality visualized.   Right ovary   The right ovary is not visualized.   Left ovary   Measurements: 2.5 cm x 2.1 cm x 2.3 cm = volume: 6.2 mL. Limited in evaluation secondary to overlying bowel gas. A 2.3 cm x 2.8 cm x 2.7 cm anechoic structure is seen adjacent to the left ovary. No abnormal flow is seen within this region on color Doppler evaluation.   Pulsed Doppler evaluation of the RIGHT ovary demonstrates normal low-resistance arterial and venous waveforms.   Other findings   It should be noted that the study is limited in evaluation secondary to overlying bowel gas and a partially filled urinary bladder.   IMPRESSION: 1. Limited study, as described above, without visualization of the right ovary. 2. Thickened, mildly heterogeneous endometrium. Sequelae associated with endometrial hyperplasia cannot be excluded. Gynecology consult with subsequent sonohysterogram should be considered for focal lesion work-up. (Ref: Radiological reasoning: Algorithmic Workup of Abnormal Vaginal Bleeding with endovaginal Sonography and Sonohysterography. AJR 2008; 681:E75-17). 3. Simple left para ovarian cyst.     Electronically Signed   By: Virgina Norfolk M.D.   On: 11/14/2021 01:27  Assessment: Ms. Akter is a 41 y.o. G2P1011 (Patient's last menstrual period was 10/21/2021 (approximate).) here for  scheduled hysterectomy; patient stable  Plan: Plan to proceed with TLH/BS/cysto    Bridget Romans MD Attending [facility])

## 2022-03-04 NOTE — Interval H&P Note (Signed)
History and Physical Interval Note:  03/04/2022 12:13 PM  [facility]  has presented today for surgery, with the diagnosis of AUB.  The various methods of treatment have been discussed with the patient and family. After consideration of risks, benefits and other options for treatment, the patient has consented to  Procedure(s): TOTAL LAPAROSCOPIC HYSTERECTOMY WITH SALPINGECTOMY (Bilateral) CYSTOSCOPY (N/A) as a surgical intervention.  The patient's history has been reviewed, patient examined, no change in status, stable for surgery.  I have reviewed the patient's chart and labs.  Questions were answered to the patient's satisfaction.     Bridget Maldonado

## 2022-03-04 NOTE — Plan of Care (Signed)
  Problem: Education: Goal: Knowledge of General Education information will improve Description Including pain rating scale, medication(s)/side effects and non-pharmacologic comfort measures Outcome: Progressing   Problem: Health Behavior/Discharge Planning: Goal: Ability to manage health-related needs will improve Outcome: Progressing   

## 2022-03-04 NOTE — Anesthesia Postprocedure Evaluation (Signed)
Anesthesia Post Note  Patient: Bridget Maldonado  Procedure(s) Performed: TOTAL LAPAROSCOPIC HYSTERECTOMY WITH SALPINGECTOMY (Bilateral: Abdomen) CYSTOSCOPY (Bladder)     Patient location during evaluation: PACU Anesthesia Type: General Level of consciousness: awake and alert, oriented and patient cooperative Pain management: pain level controlled Vital Signs Assessment: post-procedure vital signs reviewed and stable Respiratory status: spontaneous breathing, nonlabored ventilation and respiratory function stable Cardiovascular status: blood pressure returned to baseline and stable Postop Assessment: no apparent nausea or vomiting Anesthetic complications: no   No notable events documented.  Last Vitals:  Vitals:   03/04/22 1545 03/04/22 1600  BP: (!) 139/91 135/88  Pulse: 82 80  Resp: 15 15  Temp:    SpO2: 96% 94%    Last Pain:  Vitals:   03/04/22 1600  TempSrc:   PainSc: 0-No pain                 Pervis Hocking

## 2022-03-04 NOTE — Progress Notes (Signed)
Dr. Doroteo Glassman made aware of patient's elevated BP readings upon arrival to Short Stay. 149/109, 145/101, and 145/100. No new orders received at this time.

## 2022-03-04 NOTE — Progress Notes (Signed)
   03/04/22 2123  Assess: MEWS Score  Temp 98.3 F (36.8 C)  BP (!) 132/97  MAP (mmHg) 105  Pulse Rate (!) 120  Resp 17  SpO2 99 %  O2 Device Room Air  Assess: MEWS Score  MEWS Temp 0  MEWS Systolic 0  MEWS Pulse 2  MEWS RR 0  MEWS LOC 0  MEWS Score 2  MEWS Score Color Yellow  Assess: if the MEWS score is Yellow or Red  Were vital signs taken at a resting state? Yes  Focused Assessment No change from prior assessment  Does the patient meet 2 or more of the SIRS criteria? No  MEWS guidelines implemented *See Row Information* Yes  Treat  MEWS Interventions Administered scheduled meds/treatments  Pain Scale 0-10  Pain Score 4  Pain Type Acute pain;Surgical pain  Pain Location Abdomen  Pain Orientation Lateral  Pain Descriptors / Indicators Aching  Pain Frequency Constant  Pain Onset On-going  Patients Stated Pain Goal 2  Pain Intervention(s) Medication (See eMAR)  Take Vital Signs  Increase Vital Sign Frequency  Yellow: Q 2hr X 2 then Q 4hr X 2, if remains yellow, continue Q 4hrs  Escalate  MEWS: Escalate Yellow: discuss with charge nurse/RN and consider discussing with provider and RRT  Notify: Charge Nurse/RN  Name of Charge Nurse/RN Notified Jewel,RN  Date Charge Nurse/RN Notified 03/04/22  Time Charge Nurse/RN Notified 2215  Document  Patient Outcome Other (Comment) (remains stable and on unit)  Assess: SIRS CRITERIA  SIRS Temperature  0  SIRS Pulse 1  SIRS Respirations  0  SIRS WBC 1  SIRS Score Sum  2

## 2022-03-04 NOTE — Transfer of Care (Signed)
Immediate Anesthesia Transfer of Care Note  Patient: Bridget Maldonado  Procedure(s) Performed: TOTAL LAPAROSCOPIC HYSTERECTOMY WITH SALPINGECTOMY (Bilateral: Abdomen) CYSTOSCOPY (Bladder)  Patient Location: PACU  Anesthesia Type:General  Level of Consciousness: drowsy and patient cooperative  Airway & Oxygen Therapy: Patient Spontanous Breathing  Post-op Assessment: Report given to RN, Post -op Vital signs reviewed and stable and Patient moving all extremities X 4  Post vital signs: Reviewed and stable  Last Vitals:  Vitals Value Taken Time  BP 146/87 03/04/22 1537  Temp    Pulse 85 03/04/22 1541  Resp 15 03/04/22 1541  SpO2 97 % 03/04/22 1541  Vitals shown include unvalidated device data.  Last Pain:  Vitals:   03/04/22 1033  TempSrc:   PainSc: 0-No pain      Patients Stated Pain Goal: 0 (03/15/29 0762)  Complications: No notable events documented.

## 2022-03-04 NOTE — Op Note (Addendum)
Operative Note   03/04/2022  PRE-OP DIAGNOSIS * Abnormal Uterine Bleeding with pain   POST-OP DIAGNOSIS *Same.  *Pelvic adhesive disease   SURGEON: Surgeon(s) and Role:    * Aletha Halim, MD - Primary  ASSISTANT:    Griffin Basil, MD - Assisting  An experienced assistant was required given the standard of surgical care given the complexity of the case.  This assistant was needed for exposure, dissection, suctioning, retraction, instrument exchange, and for overall help during the procedure.  PROCEDURE: Total laparoscopic hysterectomy, partial left salpingectomy, cystoscopy  ANESTHESIA: General and local  ESTIMATED BLOOD LOSS:  26m  DRAINS: indwelling foley UOP 1578m TOTAL IV FLUIDS: 160064mrystalloid  VTE PROPHYLAXIS: SCDs to the bilateral lower extremities  ANTIBIOTICS: Two grams of Cefazolin were given. within 1 hour of skin incision  SPECIMENS: cervix, uterus and part of left fallopian tube and corresponding filshie clip  DISPOSITION: PACU - hemodynamically stable.  CONDITION: stable  COMPLICATIONS: unable to do bilateral salpingectomy due to tubes being adhered to the epiploica  FINDINGS: normal EGBUS, vagina, cervix. Small, mobile, anteverted uterus. On laparoscopy, normal stomach and liver edge. Omental adhesion to the area of the prior low transverse skin incision. +Bladder flap adhesions. Bilateral tubes had filshie clips with the left tube having one clip and the right one with two clips, one placed close to the cornua and the other in the mid tube region at the ampulla/isthmus junction, as on the other tube. Grossly normal ovaries and uterus.  +efflux of fluorescein from bilateral ureteral orifices on cystocopy, normal bladder dome on cystoscopy.   DESCRIPTION OF PROCEDURE: After informed consent was obtained, the patient was taken to the operating room where anesthesia was obtained without difficulty. The patient was positioned in the dorsal lithotomy  position in [place] her arms were carefully tucked at her sides and the usual precautions were taken.  She was prepped and draped in normal sterile fashion.  Time-out was performed and a Foley catheter was placed into the bladder. A standard VCare uterine manipulator was then placed in the uterus without incident. Gloves were then changed, and after injection of local anesthesia, the open technique was used to place an infraumbilical 12-70-WCloon trocar under direct visualization. The laparoscope was introduced and CO2 gas was infused for pneumoperitoneum to a pressure of 15 mm Hg and the area below inspected for injury.  The patient was placed in Trendelenburg and the bowel was displaced up into the upper abdomen, and the right and left lateral 5-mm ports under direct visualization after injection of local anesthesia. The ligasure was used to lyse the omental adhesion and an 11 mm suprapubic port was placed under direct visualization at the time of the adhesion after injection of local anesthesia.    The bilateral cornua were then cauterized with the Ligasure device, and the round ligaments were divided on each side with the bovie and the retroperitoneal space was opened bilaterally.  A bladder flap was created and the bladder was dissected down off the lower uterine segment and cervix using bovie hook and the ligasure.  The broad ligaments were then opened, and the uterine arteries were skeletonized bilaterally, sealed and divided with the Ligasure device.  A colpotomy was performed circumferentially along the ring with electrocautery and the cervix was incised from the vagina and the specimen was removed through the vagina.  A pneumo balloon was placed in the vagina and the vaginal cuff was then closed in a running  continuous fashion using the EndoStitch technique with 2-0 V-Lock suture with careful attention to include the vaginal cuff angles and the vaginal mucosa within the closure.  The a left  tubal segment that I felt I could safely removed was then cauterized, transected and separated from the ovary, with the Ligasure device and removed; the cystoscopy was then performed, with the above noted findings. The intraperitoneal pressure was dropped, and all planes of dissection, vascular pedicles and the vaginal cuff were found to be hemostatic.    The suprapubic trocar was removed and the fascia was closed with 0 vicryl suture using the Minette Headland II device and then the umbilical port closed the same way.  The lateral trocars were removed under visualization after releasing the gas.    The skin incision at the umbilicus and suprapubic port were closed with a subcuticular stitch of 4-0 monocryl.  The remaining skin incisions were closed with Dermabond glue.  The patient tolerated the procedure well.  Sponge, lap and needle counts were correct x2.  The patient was taken to recovery room in excellent condition.  Durene Romans MD Attending Center for [facility]

## 2022-03-04 NOTE — Progress Notes (Signed)
Gynecology Progress Note  Admission Date: 03/04/2022 Current Date: 03/04/2022 9:10 PM  Bridget Maldonado is a 41 y.o. G2P1011 POD#0 tlh/LS/cysto (EBL 31m).    History complicated by: Patient Active Problem List   Diagnosis Date Noted   Abnormal uterine bleeding 03/04/2022   Status post hysterectomy 03/04/2022   Dysfunctional uterine bleeding 10/07/2021   Preventative health care 06/13/2020   Bipolar disease, chronic ([place] 07/27/2018   Family history of colon cancer 07/27/2017   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   History of cervical dysplasia 07/16/2017   Family history of breast cancer 07/16/2017   Secondary oligomenorrhea 07/16/2017   History of endometriosis 07/16/2017   [place]oriatic arthritis ([place] 10/28/2016   Chronic low back pain 10/28/2016   Acute bronchitis 05/14/2015   Essential (primary) hypertension 10/18/2014   Arthritis 11/24/2013   Arthropathia 11/24/2013   Obstipation 04/12/2013   Migraine with status migrainosus 09/08/2012   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   Mild persistent asthma 05/26/2011   Severe anxiety with panic 08/29/2006   Panic disorder without agoraphobia 08/29/2006    ROS and patient/family/surgical history, located on admission H&P note dated 03/04/2022, have been reviewed, and there are no changes except as noted below Yesterday/Overnight Events:  N/a  Subjective:  Patient feels well. +voiding, ambulation and taking PO and pain controled with po meds  Objective:    Current Vital Signs 24h Vital Sign Ranges  T (!) 97.5 F (36.4 C) Temp  Avg: 97.8 F (36.6 C)  Min: 97.5 F (36.4 C)  Max: 97.9 F (36.6 C)  BP (!) 133/93 BP  Min: 133/93  Max: 186/122  HR 91 Pulse  Avg: 84.3  Min: 76  Max: 98  RR 17 Resp  Avg: 15.5  Min: 13  Max: 18  SaO2 99 % Room Air SpO2  Avg: 97.6 %  Min: 94 %  Max: 100 %       24 Hour I/O Current Shift I/O  Time Ins Outs No intake/output data recorded. No intake/output data recorded.    Patient Vitals for the past 12 hrs:  BP Temp Temp src Pulse Resp SpO2 Height Weight  03/04/22 2042 -- -- -- -- -- 99 % -- --  03/04/22 1811 (!) 133/93 (!) 97.5 F (36.4 C) Oral 91 17 99 % -- --  03/04/22 1615 (!) 135/92 97.8 F (36.6 C) -- 76 13 100 % -- --  03/04/22 1600 135/88 -- -- 80 15 94 % -- --  03/04/22 1545 (!) 139/91 -- -- 82 15 96 % -- --  03/04/22 1537 (!) 146/87 97.8 F (36.6 C) -- 79 15 97 % -- --  03/04/22 1053 (!) 145/100 -- -- -- -- -- -- --  03/04/22 1042 (!) 145/101 -- -- -- -- -- -- --  03/04/22 1024 (!) 186/122 97.9 F (36.6 C) Oral 98 18 98 % '5\' 5"'$  (1.651 m) 94.3 kg   Physical exam: General appearance: alert, cooperative, and appears stated age Abdomen: soft, non-tender; bowel sounds normal; no masses,  no organomegaly. C/d/I l/s port sites GU: No gross VB Lungs: no respiratory distress Extremities: SCDs Skin: warm and dry Psych: appropriate Neurologic: Grossly normal  Medications Current Facility-Administered Medications  Medication Dose Route Frequency Provider Last Rate Last Admin   0.9 %  sodium chloride infusion   Intravenous Continuous PAletha Halim MD 100 mL/hr at 03/04/22 1759 1,000 mL at 03/04/22 1759   acetaminophen (TYLENOL) 500 MG tablet  albuterol (PROVENTIL) (2.5 MG/3ML) 0.083% nebulizer solution 2.5 mg  2.5 mg Inhalation Q6H PRN Aletha Halim, MD       ARIPiprazole (ABILIFY) tablet 5 mg  5 mg Oral QHS Aletha Halim, MD       [START ON 03/05/2022] DULoxetine (CYMBALTA) DR capsule 90 mg  90 mg Oral Daily Aletha Halim, MD       gabapentin (NEURONTIN) capsule 100 mg  100 mg Oral BID Aletha Halim, MD       HYDROmorphone (DILAUDID) 1 MG/ML injection            HYDROmorphone (DILAUDID) injection 0.5 mg  0.5 mg Intravenous Q3H PRN Aletha Halim, MD   0.5 mg at 03/04/22 1944   [START ON 03/05/2022] ibuprofen (ADVIL) tablet 600 mg  600 mg Oral Q6H Aletha Halim, MD       [START ON 03/05/2022] loratadine (CLARITIN)  tablet 10 mg  10 mg Oral Daily Aletha Halim, MD       losartan (COZAAR) tablet 50 mg  50 mg Oral QHS Aletha Halim, MD       mometasone-formoterol (DULERA) 200-5 MCG/ACT inhaler 2 puff  2 puff Inhalation BID Aletha Halim, MD   2 puff at 03/04/22 2042   ondansetron (ZOFRAN) tablet 4 mg  4 mg Oral Q6H PRN Aletha Halim, MD       Or   ondansetron (ZOFRAN) injection 4 mg  4 mg Intravenous Q6H PRN Aletha Halim, MD   4 mg at 03/04/22 1800   oxyCODONE (Oxy IR/ROXICODONE) immediate release tablet 5-10 mg  5-10 mg Oral Q4H PRN Aletha Halim, MD   10 mg at 03/04/22 1759   pantoprazole (PROTONIX) injection 40 mg  40 mg Intravenous QHS Aletha Halim, MD       [START ON 03/05/2022] polyethylene glycol (MIRALAX / GLYCOLAX) packet 17 g  17 g Oral Daily Aletha Halim, MD       scopolamine (TRANSDERM-SCOP) 1 MG/3DAYS            simethicone (MYLICON) chewable tablet 80 mg  80 mg Oral TID Aletha Halim, MD   80 mg at 03/04/22 1800    Labs  None   Radiology none  Assessment & Plan:  Pt doing well *GYN: routine post op care. Anticipate d/c to home tomorrow morning.   Code Status: Full Code   Durene Romans MD Attending Center for [facility])

## 2022-03-05 ENCOUNTER — Encounter (HOSPITAL_COMMUNITY): Payer: Self-pay | Admitting: Obstetrics and Gynecology

## 2022-03-05 DIAGNOSIS — J45909 Unspecified asthma, uncomplicated: Secondary | ICD-10-CM | POA: Diagnosis not present

## 2022-03-05 DIAGNOSIS — Z79899 Other long term (current) drug therapy: Secondary | ICD-10-CM | POA: Diagnosis not present

## 2022-03-05 DIAGNOSIS — N949 Unspecified condition associated with female genital organs and menstrual cycle: Secondary | ICD-10-CM | POA: Diagnosis not present

## 2022-03-05 DIAGNOSIS — I10 Essential (primary) hypertension: Secondary | ICD-10-CM | POA: Diagnosis not present

## 2022-03-05 DIAGNOSIS — N939 Abnormal uterine and vaginal bleeding, unspecified: Secondary | ICD-10-CM | POA: Diagnosis not present

## 2022-03-05 MED ORDER — IBUPROFEN 600 MG PO TABS: 600.0000 mg | ORAL_TABLET | Freq: Four times a day (QID) | ORAL | 0 refills | Status: DC | PRN

## 2022-03-05 MED ORDER — POLYETHYLENE GLYCOL 3350 17 G PO PACK: 17.0000 g | PACK | Freq: Every day | ORAL | 1 refills | Status: AC

## 2022-03-05 MED ORDER — OXYCODONE HCL 5 MG PO TABS: 5.0000 mg | ORAL_TABLET | ORAL | 0 refills | Status: DC | PRN

## 2022-03-05 MED ORDER — GABAPENTIN 100 MG PO CAPS: 100.0000 mg | ORAL_CAPSULE | Freq: Two times a day (BID) | ORAL | 0 refills | Status: DC

## 2022-03-05 MED ORDER — SIMETHICONE 80 MG PO CHEW: 80.0000 mg | CHEWABLE_TABLET | Freq: Two times a day (BID) | ORAL | 0 refills | Status: DC

## 2022-03-05 NOTE — Discharge Instructions (Signed)
Laparoscopic Surgery Discharge Instructions  Instructions Following Major Laparoscopic Surgery You have just undergone a major laparoscopic surgery.  The following list should answer your most common questions.  Although we will discuss your surgery and post-operative instructions with you prior to your discharge, this list will serve as a reminder if you fail to recall the details of what we discussed.  We will discuss your surgery once again in detail at your post-op visit in two to four weeks. If you haven't already done so, please call to make your appointment as soon as possible.  How you will feel: Although you have just undergone a major surgery, your recovery will be significantly shorter since the surgery was performed through much smaller incisions than the traditional approach.  You should feel slightly better each day.  If you suddenly feel much worse than the prior day, please call the clinic.  It's important during the early part of your recovery that you maintain some activity.  Walking is encouraged.  You will quicken your recovery by continued activity.  Incision:  Your incisions will be closed with dissolvable stitches or surgical adhesive (glue).  There may be Band-aids and/or Steri-strips covering your incisions.  If there is no drainage from the incisions you may remove the Band-aids in one to two days.  You may notice some minor bruising at the incision sites.  This is common and will resolve within several days.  Please inform us if the redness at the edges of your incision appears to be spreading.  If the skin around your incision becomes warm to the touch, or if you notice a pus-like drainage, please call the office.  Vaginal Discharge Following a Laparoscopic Hysterectomy: Minor vaginal bleeding or spotting is normal following a hysterectomy.  Bleeding similar to the amount of your period is excessive, and you should inform us of this immediately.  Vaginal spotting may continue  for several weeks following your surgery.  You may notice a yellowish discharge which occasionally occurs as the vaginal stitches dissolve, and may last for several weeks.  Sexual Activity Following a Hysterectomy: Do not have sexual intercourse or place tampons or douches in the vagina prior to your first office visit.  We will discuss when you may resume these activities at that visit.    Stairs/Driving/Activities: You may climb stairs if necessary.  If you've had general anesthesia, do not drive a car the rest of the day today.  You may begin light housework when you feel up to it, but avoid heavy lifting (more than 15-20lbs) or pushing until cleared for these activities by your physician.  Hygiene:  Do not soak your incisions.  Showers are acceptable but you may not take a bath or swim in a pool.  Cleanse your incisions daily with soap and water.  Medications:  Please resume taking any medications that you were taking prior to the surgery.  If we have prescribed any new medications for you, please take them as directed.  Constipation:  It is fairly common to experience some difficulty in moving your bowels following major surgery.  Being active will help to reduce this likelihood. A diet rich in fiber and plenty of liquids is desirable.  If you do become constipated, a mild laxative such as Miralax, Milk of Magnesia, or Metamucil, or a stool softener such as Colace, is recommended.  General Instructions: If you develop a fever of 100.5 degrees or higher, please call the office number(s) below for physician on call.   

## 2022-03-05 NOTE — Progress Notes (Signed)
[facility] to be D/C'd per MD order. Discussed with the patient and all questions fully answered.   VSS, Skin clean, dry, and intact without evidence of skin break down, no evidence of skin tears noted.  IV catheter discontinued, Intact site without signs and symptoms of complications. Dressing and pressure applied.  An After Visit Summary was printed and given to the patient.  D/C re-educated and completed with patient/family including follow up instructions, medication list, d/c activities, limitations if indicated, with other d/c instructions as indicated by MD - patient able to verbalize understanding, all questions fully answered.   Patient instructed to return to ED, call 911, or call MD for any changes in condition.  Patient to be escorted via [facility] and D/C home via private auto

## 2022-03-05 NOTE — Discharge Summary (Signed)
Gynecology Discharge Summary Date of Admission: 03/04/2022 Date of Discharge: 03/05/2022  The patient was admitted, as scheduled, and underwent a TLH/LS/cysto; please refer to operative note for full details.  She was meeting all post op goals and discharged to home on POD#1  Allergies as of 03/05/2022       Reactions   Sulfa Antibiotics Nausea And Vomiting   Cipro [ciprofloxacin Hcl] Nausea And Vomiting   Effexor [venlafaxine Hcl] Other (See Comments)   Excessive sleeping, slept for 2 days        Medication List     STOP taking these medications    megestrol 40 MG tablet Commonly known as: MEGACE   traMADol 50 MG tablet Commonly known as: ULTRAM       TAKE these medications    albuterol 108 (90 Base) MCG/ACT inhaler Commonly known as: VENTOLIN HFA Inhale 2 puffs into the lungs every 6 (six) hours as needed for wheezing or shortness of breath.   ALPRAZolam 0.25 MG tablet Commonly known as: XANAX Take 1 tablet (0.25 mg total) by mouth 2 (two) times daily as needed for anxiety.   ARIPiprazole 5 MG tablet Commonly known as: ABILIFY Take 1 tablet (5 mg total) by mouth at bedtime.   cetirizine 10 MG tablet Commonly known as: ZYRTEC Take 1 tablet (10 mg total) by mouth daily. What changed:  when to take this reasons to take this   DULoxetine 60 MG capsule Commonly known as: CYMBALTA Take 1 capsule (60 mg total) by mouth daily.   DULoxetine 30 MG capsule Commonly known as: CYMBALTA Take 1 capsule (30 mg total) by mouth daily.   Fluticasone-Salmeterol 250-50 MCG/DOSE Aepb Commonly known as: ADVAIR Inhale 1 puff into the lungs 2 (two) times daily.   gabapentin 100 MG capsule Commonly known as: NEURONTIN Take 1 capsule (100 mg total) by mouth 2 (two) times daily for 7 days.   ibuprofen 600 MG tablet Commonly known as: ADVIL Take 1 tablet (600 mg total) by mouth every 6 (six) hours as needed for headache, cramping or mild pain. What changed:  medication  strength how much to take when to take this   losartan 50 MG tablet Commonly known as: COZAAR Take 1 tablet (50 mg total) by mouth daily.   mometasone 0.1 % ointment Commonly known as: ELOCON APPLY TWICE DAILY AS NEEDED FOR PSORIASIS What changed:  how much to take how to take this when to take this reasons to take this additional instructions   montelukast 10 MG tablet Commonly known as: SINGULAIR Take 1 tablet (10 mg total) by mouth at bedtime. What changed:  when to take this reasons to take this   oxyCODONE 5 MG immediate release tablet Commonly known as: Oxy IR/ROXICODONE Take 1-2 tablets (5-10 mg total) by mouth every 4 (four) hours as needed for moderate pain.   polyethylene glycol 17 g packet Commonly known as: MIRALAX / GLYCOLAX Take 17 g by mouth daily for 14 days.   promethazine-dextromethorphan 6.25-15 MG/5ML syrup Commonly known as: PROMETHAZINE-DM Take 5 mLs by mouth at bedtime as needed for cough.   simethicone 80 MG chewable tablet Commonly known as: MYLICON Chew 1 tablet (80 mg total) by mouth 2 (two) times daily for 7 days.        Future Appointments  Date Time Provider [place]  09/17/2022  8:00 AM Venia Carbon, MD [facility]    Durene Romans MD Attending Center for [facility])

## 2022-03-06 LAB — SURGICAL PATHOLOGY

## 2022-03-09 ENCOUNTER — Emergency Department
Admission: EM | Admit: 2022-03-09 | Discharge: 2022-03-10 | Discharge status: Against Medical Advice | Payer: BC Managed Care – PPO | Attending: Emergency Medicine | Admitting: Emergency Medicine

## 2022-03-09 ENCOUNTER — Other Ambulatory Visit: Payer: Self-pay

## 2022-03-09 DIAGNOSIS — R059 Cough, unspecified: Secondary | ICD-10-CM | POA: Insufficient documentation

## 2022-03-09 DIAGNOSIS — Z5321 Procedure and treatment not carried out due to patient leaving prior to being seen by health care provider: Secondary | ICD-10-CM | POA: Diagnosis not present

## 2022-03-09 NOTE — ED Notes (Signed)
Patient denies chest pain. No dyspnea noted.

## 2022-03-09 NOTE — ED Triage Notes (Signed)
Per [facility], patient had a hysterectomy on Tuesday and has developed a cough. Patient denies shortness of breath.

## 2022-03-11 ENCOUNTER — Telehealth (INDEPENDENT_AMBULATORY_CARE_PROVIDER_SITE_OTHER): Payer: BC Managed Care – PPO | Admitting: Internal Medicine

## 2022-03-11 ENCOUNTER — Encounter: Payer: Self-pay | Admitting: Internal Medicine

## 2022-03-11 DIAGNOSIS — J22 Unspecified acute lower respiratory infection: Secondary | ICD-10-CM | POA: Insufficient documentation

## 2022-03-11 MED ORDER — HYDROCODONE BIT-HOMATROP MBR 5-1.5 MG/5ML PO SOLN: 5.0000 mL | Freq: Every evening | ORAL | 0 refills | Status: DC | PRN

## 2022-03-11 NOTE — Progress Notes (Signed)
Subjective:    Patient ID: Bridget Maldonado, female    DOB: 1980/11/10, 41 y.o.   MRN: [id number]  HPI Video virtual visit due to cough Identification done Reviewed limitations and billing and she gave consent Participants--patient at home and I am in my office  Just had hysterectomy a week ago Started with the cough 2 days later Progressive worsening Went to urgent care--they didn't see her and sent her to ER (too long wait so she left)  Ongoing cough Occasional sputum No SOB Had fever 2 weeks ago--from flare up? Nothing recent Some chills last night Some headache--whole head No nasal congestion or drainage No ear pain  Hasn't tested for COVID Has used delsym--slight help  Current Outpatient Medications on File Prior to Visit  Medication Sig Dispense Refill   albuterol (PROVENTIL HFA;VENTOLIN HFA) 108 (90 Base) MCG/ACT inhaler Inhale 2 puffs into the lungs every 6 (six) hours as needed for wheezing or shortness of breath. 1 Inhaler 1   ALPRAZolam (XANAX) 0.25 MG tablet Take 1 tablet (0.25 mg total) by mouth 2 (two) times daily as needed for anxiety. 60 tablet 0   ARIPiprazole (ABILIFY) 5 MG tablet Take 1 tablet (5 mg total) by mouth at bedtime. 90 tablet 3   cetirizine (ZYRTEC) 10 MG tablet Take 1 tablet (10 mg total) by mouth daily. (Patient taking differently: Take 10 mg by mouth daily as needed for allergies.) 30 tablet 0   DULoxetine (CYMBALTA) 30 MG capsule Take 1 capsule (30 mg total) by mouth daily. 30 capsule 3   DULoxetine (CYMBALTA) 60 MG capsule Take 1 capsule (60 mg total) by mouth daily. 90 capsule 3   Fluticasone-Salmeterol (ADVAIR) 250-50 MCG/DOSE AEPB Inhale 1 puff into the lungs 2 (two) times daily. 180 each 3   gabapentin (NEURONTIN) 100 MG capsule Take 1 capsule (100 mg total) by mouth 2 (two) times daily for 7 days. 14 capsule 0   ibuprofen (ADVIL) 600 MG tablet Take 1 tablet (600 mg total) by mouth every 6 (six) hours as needed for headache, cramping or  mild pain. 30 tablet 0   mometasone (ELOCON) 0.1 % ointment APPLY TWICE DAILY AS NEEDED FOR PSORIASIS (Patient taking differently: Apply 1 application  topically 2 (two) times daily as needed (psoriasis).) 45 g 1   montelukast (SINGULAIR) 10 MG tablet Take 1 tablet (10 mg total) by mouth at bedtime. 90 tablet 3   oxyCODONE (OXY IR/ROXICODONE) 5 MG immediate release tablet Take 1-2 tablets (5-10 mg total) by mouth every 4 (four) hours as needed for moderate pain. 30 tablet 0   polyethylene glycol (MIRALAX / GLYCOLAX) 17 g packet Take 17 g by mouth daily for 14 days. 14 each 1   simethicone (MYLICON) 80 MG chewable tablet Chew 1 tablet (80 mg total) by mouth 2 (two) times daily for 7 days. 14 tablet 0   losartan (COZAAR) 50 MG tablet Take 1 tablet (50 mg total) by mouth daily. 90 tablet 3   No current facility-administered medications on file prior to visit.    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   Increased risk of breast cancer 07/2017   IBIS=40%   Kidney infection    Pylonephritis when pregnant   LGSIL on Pap smear of cervix    Migraines    Obese    Psoriatic arthritis ([place])     Past Surgical History:  Procedure Laterality Date   BREAST BIOPSY Left 8 years   CESAREAN SECTION     COLONOSCOPY     COLONOSCOPY W/ BIOPSIES     CYSTOSCOPY N/A 03/04/2022   Procedure: CYSTOSCOPY;  Surgeon: Aletha Halim, MD;  Location: [facility];  Service: Gynecology;   Laterality: N/A;   EAR TUBE REMOVAL     ENDOMETRIAL BIOPSY  12/23/2021   ESOPHAGOGASTRODUODENOSCOPY     LAPAROSCOPIC CHOLECYSTECTOMY  6/09   Dr. Hulen Skains   TOTAL LAPAROSCOPIC HYSTERECTOMY WITH SALPINGECTOMY Bilateral 03/04/2022   Procedure: TOTAL LAPAROSCOPIC HYSTERECTOMY WITH SALPINGECTOMY;  Surgeon: Aletha Halim, MD;  Location: [facility];  Service: Gynecology;  Laterality: Bilateral;   TUBAL LIGATION  2017   TYMPANOSTOMY TUBE PLACEMENT      Family History  Problem Relation Age of Onset   Colon polyps Maternal Grandfather    Parkinson's disease Maternal Grandfather    Breast cancer Mother 30   Rheum arthritis Mother    Irritable bowel syndrome Mother    Asthma Father    Anxiety disorder Father        Severe, possible bipolar   Irritable bowel syndrome Maternal Grandmother    Diabetes Other        mat. great uncles   Alzheimer's disease Other        Dad's side   Colon cancer Other        mat GGF    Social History   Socioeconomic History   Marital status: Married    Spouse name: Not on file   Number of children: 1   Years of education: Not on file   Highest education level: Not on file  Occupational History   Occupation: Administration    Employer: [employer]  Tobacco Use   Smoking status: Never    Passive exposure: Past   Smokeless tobacco: Never  Vaping Use   Vaping Use: Never used  Substance and Sexual Activity   Alcohol use: No    Alcohol/week: 0.0 standard drinks of alcohol   Drug use: No   Sexual activity: Yes    Birth control/protection: Surgical  Other Topics Concern   Not on file  Social History Narrative   Married 10/22   Social Determinants of Health   Financial Resource Strain: Not on file  Food Insecurity: Not on file  Transportation Needs: Not on file  Physical Activity: Not on file  Stress: Not on file  Social Connections: Not on file  Intimate Partner Violence: Not on file   Review of Systems Trouble sleeping due to cough Some nausea  yesterday--better today Eating okay No abdominal pain---still with incisional pain with the cough     Objective:   Physical Exam Constitutional:      Appearance: Normal appearance.  Pulmonary:     Effort: Pulmonary effort is normal. No respiratory distress.  Neurological:     Mental Status: She is alert.            Assessment & Plan:

## 2022-03-11 NOTE — Assessment & Plan Note (Addendum)
Started 5 days ago--2 days after surgery No clear COVID exposure--but could be (too late for antivirals) Nothing to suggest bacterial infection Continue analgesics--ibuprofen, tylenol or aleve Hydrocodone cough syrup for nighttime  Consider antibiotic if more productive cough

## 2022-03-12 ENCOUNTER — Encounter: Payer: Self-pay | Admitting: Obstetrics and Gynecology

## 2022-03-12 DIAGNOSIS — N870 Mild cervical dysplasia: Secondary | ICD-10-CM | POA: Insufficient documentation

## 2022-03-18 ENCOUNTER — Other Ambulatory Visit: Payer: Self-pay | Admitting: Internal Medicine

## 2022-03-18 MED ORDER — ALPRAZOLAM 0.25 MG PO TABS: 0.2500 mg | ORAL_TABLET | Freq: Two times a day (BID) | ORAL | 0 refills | Status: DC | PRN

## 2022-03-18 NOTE — Telephone Encounter (Signed)
Last filled 01-16-22 #60 Last OV Acute 03-11-22 Next OV 09-17-22 [place]

## 2022-04-07 ENCOUNTER — Encounter: Payer: Self-pay | Admitting: Internal Medicine

## 2022-04-07 MED ORDER — PENICILLIN V POTASSIUM 500 MG PO TABS: 500.0000 mg | ORAL_TABLET | Freq: Three times a day (TID) | ORAL | 0 refills | Status: AC

## 2022-04-15 ENCOUNTER — Encounter: Payer: Self-pay | Admitting: Internal Medicine

## 2022-04-15 NOTE — Telephone Encounter (Deleted)
Name of Medication:tramadol 50 mg  Name of [place] or Written Date and Quantity: # 30 on 02/14/2022 Last Office Visit and Type: 03/11/22 Video visit and 10/07/21 FU Next Office Visit and Type: 09/17/2022 CPX  Sending request to Dr Silvio Pate.

## 2022-04-15 NOTE — Telephone Encounter (Signed)
Last filled 02-14-22 #60 Last OV Acute 03-11-22 Next OV 09-17-22 [place] like a the dr who did her hysterectomy stopped the tramadol 03-05-22 upon discharge.

## 2022-04-16 MED ORDER — TRAMADOL HCL 50 MG PO TABS: 50.0000 mg | ORAL_TABLET | Freq: Two times a day (BID) | ORAL | 0 refills | Status: DC | PRN

## 2022-04-17 ENCOUNTER — Ambulatory Visit (INDEPENDENT_AMBULATORY_CARE_PROVIDER_SITE_OTHER): Payer: BC Managed Care – PPO | Admitting: Obstetrics and Gynecology

## 2022-04-17 ENCOUNTER — Encounter: Payer: Self-pay | Admitting: Family Medicine

## 2022-04-17 ENCOUNTER — Encounter: Payer: Self-pay | Admitting: Obstetrics and Gynecology

## 2022-04-17 VITALS — BP 151/106 | HR 77 | Wt 209.0 lb

## 2022-04-17 DIAGNOSIS — N870 Mild cervical dysplasia: Secondary | ICD-10-CM

## 2022-04-17 DIAGNOSIS — Z09 Encounter for follow-up examination after completed treatment for conditions other than malignant neoplasm: Secondary | ICD-10-CM

## 2022-04-17 NOTE — Progress Notes (Signed)
Obstetrics and Gynecology Visit Return Patient Evaluation  Appointment Date: 04/17/2022  Primary Care Provider: [place], [place] for [facility]  Chief Complaint: routine post op check   History of Present Illness:  Bridget Maldonado is a 41 y.o. is s/p 9/12 TLH/partial left salpingectomy tube with removal of left filshie clip for AUB and pain and discharge on POD#1   Interval History: Patient no problems or issues  Review of Systems: as noted in the History of Present Illness.  Patient Active Problem List   Diagnosis Date Noted   Dysplasia of cervix, low grade (CIN 1) 03/12/2022   Preventative health care 06/13/2020   Bipolar disease, chronic ([place]) 07/27/2018   Family history of colon cancer 07/27/2017   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   Family history of breast cancer 07/16/2017   Psoriatic arthritis ([place]) 10/28/2016   Chronic low back pain 10/28/2016   Essential (primary) hypertension 10/18/2014   Arthritis 11/24/2013   Arthropathia 11/24/2013   Obstipation 04/12/2013   Migraine with status migrainosus 09/08/2012   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   Mild persistent asthma 05/26/2011   Severe anxiety with panic 08/29/2006   Panic disorder without agoraphobia 08/29/2006   Medications:  Terance Hart. [place] had no medications administered during this visit. Current Outpatient Medications  Medication Sig Dispense Refill   albuterol (PROVENTIL HFA;VENTOLIN HFA) 108 (90 Base) MCG/ACT inhaler Inhale 2 puffs into the lungs every 6 (six) hours as needed for wheezing or shortness of breath. 1 Inhaler 1   ALPRAZolam (XANAX) 0.25 MG tablet Take 1 tablet (0.25 mg total) by mouth 2 (two) times daily as needed for anxiety. 60 tablet 0   ARIPiprazole (ABILIFY) 5 MG tablet Take 1 tablet (5 mg total) by mouth at bedtime. 90 tablet 3   cetirizine (ZYRTEC) 10 MG tablet Take 1 tablet (10 mg total) by mouth daily.  (Patient taking differently: Take 10 mg by mouth daily as needed for allergies.) 30 tablet 0   DULoxetine (CYMBALTA) 30 MG capsule Take 1 capsule (30 mg total) by mouth daily. 30 capsule 3   DULoxetine (CYMBALTA) 60 MG capsule Take 1 capsule (60 mg total) by mouth daily. 90 capsule 3   Fluticasone-Salmeterol (ADVAIR) 250-50 MCG/DOSE AEPB Inhale 1 puff into the lungs 2 (two) times daily. 180 each 3   ibuprofen (ADVIL) 600 MG tablet Take 1 tablet (600 mg total) by mouth every 6 (six) hours as needed for headache, cramping or mild pain. 30 tablet 0   mometasone (ELOCON) 0.1 % ointment APPLY TWICE DAILY AS NEEDED FOR PSORIASIS (Patient taking differently: Apply 1 application  topically 2 (two) times daily as needed (psoriasis).) 45 g 1   montelukast (SINGULAIR) 10 MG tablet Take 1 tablet (10 mg total) by mouth at bedtime. 90 tablet 3   penicillin v potassium (VEETID) 500 MG tablet Take 1 tablet (500 mg total) by mouth 3 (three) times daily for 10 days. 30 tablet 0   traMADol (ULTRAM) 50 MG tablet Take 1 tablet (50 mg total) by mouth 2 (two) times daily as needed. 30 tablet 0   gabapentin (NEURONTIN) 100 MG capsule Take 1 capsule (100 mg total) by mouth 2 (two) times daily for 7 days. 14 capsule 0   losartan (COZAAR) 50 MG tablet Take 1 tablet (50 mg total) by mouth daily. 90 tablet 3   simethicone (MYLICON) 80 MG chewable tablet Chew 1 tablet (80 mg total) by mouth 2 (two) times daily for 7  days. 14 tablet 0   No current facility-administered medications for this visit.    Allergies: is allergic to sulfa antibiotics, cipro [ciprofloxacin hcl], and effexor [venlafaxine hcl].  Physical Exam:  BP (!) 151/106   Pulse 77   Wt 209 lb (94.8 kg)   BMI 34.78 kg/m  Body mass index is 34.78 kg/m. General appearance: Well nourished, well developed female in no acute distress.  Abdomen: diffusely non tender to palpation, non distended, and no masses, hernias. C/d/I l/s port sites Neuro/Psych:  Normal mood  and affect.    Pelvic exam:  EGBUS: normal Vaginal vault: normal with scant brown d/c in vault.  Cuff: focal granulation tissue point>>silver nitrate applied. Nttp.  Bimanual: Negative  Assessment: patient doing well  Plan:  1. Postop check Released from all restrictions except to do a full two and a half months of pelvic rest  2. Dysplasia of cervix, low grade (CIN 1) On final path with negative margins. Recommend routine pap in one year   RTC: 1 year  Aletha Halim, Brooke Bonito MD Attending Center for [employer] manager)

## 2022-04-17 NOTE — Progress Notes (Signed)
Patient present for Post-Visit today.  Total laparoscopic hysterectomy, partial left salpingectomy, cystoscopy on 03/04/22.  CC: None

## 2022-05-14 ENCOUNTER — Telehealth: Payer: Self-pay

## 2022-05-14 NOTE — Telephone Encounter (Signed)
Error

## 2022-05-23 ENCOUNTER — Other Ambulatory Visit: Payer: Self-pay | Admitting: Internal Medicine

## 2022-05-23 NOTE — Telephone Encounter (Signed)
Last filled 04-16-22 #60 Last OV Acute 03-11-22 Next OV 09-17-22 [place]

## 2022-05-25 MED ORDER — TRAMADOL HCL 50 MG PO TABS: 50.0000 mg | ORAL_TABLET | Freq: Two times a day (BID) | ORAL | 0 refills | Status: DC | PRN

## 2022-06-10 ENCOUNTER — Other Ambulatory Visit: Payer: Self-pay | Admitting: Internal Medicine

## 2022-06-10 MED ORDER — LOSARTAN POTASSIUM 50 MG PO TABS: 50.0000 mg | ORAL_TABLET | Freq: Every day | ORAL | 0 refills | Status: DC

## 2022-06-27 ENCOUNTER — Other Ambulatory Visit: Payer: Self-pay | Admitting: Internal Medicine

## 2022-06-27 MED ORDER — TRAMADOL HCL 50 MG PO TABS: 50.0000 mg | ORAL_TABLET | Freq: Two times a day (BID) | ORAL | 0 refills | Status: DC | PRN

## 2022-06-27 NOTE — Telephone Encounter (Signed)
Last filled 05-25-22 #60 Last OV Acute 03-11-22 Next OV 09-17-22 [place]

## 2022-07-18 ENCOUNTER — Other Ambulatory Visit: Payer: Self-pay | Admitting: Internal Medicine

## 2022-07-18 MED ORDER — ALPRAZOLAM 0.25 MG PO TABS: 0.2500 mg | ORAL_TABLET | Freq: Two times a day (BID) | ORAL | 0 refills | Status: DC | PRN

## 2022-07-18 NOTE — Telephone Encounter (Signed)
Last filled 03-18-22 #60 Last OV Acute 03-11-22 Next OV 09-17-22 [place]

## 2022-07-29 ENCOUNTER — Other Ambulatory Visit: Payer: Self-pay | Admitting: Internal Medicine

## 2022-07-29 NOTE — Telephone Encounter (Signed)
Prescription Request  07/29/2022  Is this a "Controlled Substance" medicine? Yes  LOV: acute VV 03/11/22 and 10/07/2021 ED FU Next appt scheduled is 09/17/22 CPX  What is the name of the medication or equipment? Tramadol 50 mg last filled # 30 on 06/27/2022.  Have you contacted your pharmacy to request a refill? Yes   Which pharmacy would you like this sent to?  [facility] [place] [address] Phone: [phone] Fax: [phone]   Patient notified that their request is being sent to the clinical staff for review and that they should receive a response within 2 business days.   Please advise at

## 2022-07-30 MED ORDER — TRAMADOL HCL 50 MG PO TABS: 50.0000 mg | ORAL_TABLET | Freq: Two times a day (BID) | ORAL | 0 refills | Status: DC | PRN

## 2022-08-22 ENCOUNTER — Encounter: Payer: Self-pay | Admitting: Internal Medicine

## 2022-08-22 ENCOUNTER — Telehealth: Payer: No Typology Code available for payment source | Admitting: Physician Assistant

## 2022-08-22 DIAGNOSIS — J208 Acute bronchitis due to other specified organisms: Secondary | ICD-10-CM

## 2022-08-22 DIAGNOSIS — B9689 Other specified bacterial agents as the cause of diseases classified elsewhere: Secondary | ICD-10-CM

## 2022-08-22 MED ORDER — PROMETHAZINE-DM 6.25-15 MG/5ML PO SYRP: 5.0000 mL | ORAL_SOLUTION | Freq: Four times a day (QID) | ORAL | 0 refills | Status: DC | PRN

## 2022-08-22 MED ORDER — AZITHROMYCIN 250 MG PO TABS: ORAL_TABLET | ORAL | 0 refills | Status: AC

## 2022-08-22 NOTE — Progress Notes (Signed)
Virtual Visit Consent   Bridget Maldonado, you are scheduled for a virtual visit with a [facility] provider today. Just as with appointments in the office, your consent must be obtained to participate. Your consent will be active for this visit and any virtual visit you may have with one of our providers in the next 365 days. If you have a MyChart account, a copy of this consent can be sent to you electronically.  As this is a virtual visit, video technology does not allow for your provider to perform a traditional examination. This may limit your provider's ability to fully assess your condition. If your provider identifies any concerns that need to be evaluated in person or the need to arrange testing (such as labs, EKG, etc.), we will make arrangements to do so. Although advances in technology are sophisticated, we cannot ensure that it will always work on either your end or our end. If the connection with a video visit is poor, the visit may have to be switched to a telephone visit. With either a video or telephone visit, we are not always able to ensure that we have a secure connection.  By engaging in this virtual visit, you consent to the provision of healthcare and authorize for your insurance to be billed (if applicable) for the services provided during this visit. Depending on your insurance coverage, you may receive a charge related to this service.  I need to obtain your verbal consent now. Are you willing to proceed with your visit today? Bridget Maldonado has provided verbal consent on 08/22/2022 for a virtual visit (video or telephone). Mar Daring, PA-C  Date: 08/22/2022 12:43 PM  Virtual Visit via Video Note   IMar Daring, connected with  Bridget Maldonado  ([id number], [birth year]) on 08/22/22 at 12:30 PM EST by a video-enabled telemedicine application and verified that I am speaking with the correct person using two identifiers.  Location: Patient: Virtual Visit  Location Patient: Home Provider: Virtual Visit Location Provider: Home Office   I discussed the limitations of evaluation and management by telemedicine and the availability of in person appointments. The patient expressed understanding and agreed to proceed.    History of Present Illness: Bridget Maldonado is a 42 y.o. who identifies as a female who was assigned female at birth, and is being seen today for cough.  HPI: Cough This is a new problem. The current episode started 1 to 4 weeks ago. The problem has been gradually worsening. The problem occurs constantly. The cough is Non-productive. Associated symptoms include ear pain (a little yesterday but now improved), headaches, nasal congestion, postnasal drip, rhinorrhea and wheezing (nighttime). Pertinent negatives include no chest pain, chills, ear congestion, fever, myalgias, sore throat or shortness of breath. The symptoms are aggravated by lying down. She has tried OTC cough suppressant and a beta-agonist inhaler (cough medication and cold and flu medication) for the symptoms. The treatment provided no relief. Her past medical history is significant for asthma. There is no history of bronchitis or pneumonia.     Problems:  Patient Active Problem List   Diagnosis Date Noted   Dysplasia of cervix, low grade (CIN 1) 03/12/2022   Preventative health care 06/13/2020   Bipolar disease, chronic ([place]) 07/27/2018   Family history of colon cancer 07/27/2017   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   Family history of breast cancer 07/16/2017   Psoriatic arthritis ([place]) 10/28/2016   Chronic low back pain 10/28/2016   Essential (  primary) hypertension 10/18/2014   Arthritis 11/24/2013   Arthropathia 11/24/2013   Obstipation 04/12/2013   Migraine with status migrainosus 09/08/2012   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   Mild persistent asthma 05/26/2011   Severe anxiety with panic 08/29/2006   Panic disorder without  agoraphobia 08/29/2006    Allergies:  Allergies  Allergen Reactions   Sulfa Antibiotics Nausea And Vomiting   Cipro [Ciprofloxacin Hcl] Nausea And Vomiting   Effexor [Venlafaxine Hcl] Other (See Comments)    Excessive sleeping, slept for 2 days   Medications:  Current Outpatient Medications:    azithromycin (ZITHROMAX) 250 MG tablet, Take 2 tablets on day 1, then 1 tablet daily on days 2 through 5, Disp: 6 tablet, Rfl: 0   promethazine-dextromethorphan (PROMETHAZINE-DM) 6.25-15 MG/5ML syrup, Take 5 mLs by mouth 4 (four) times daily as needed., Disp: 118 mL, Rfl: 0   albuterol (PROVENTIL HFA;VENTOLIN HFA) 108 (90 Base) MCG/ACT inhaler, Inhale 2 puffs into the lungs every 6 (six) hours as needed for wheezing or shortness of breath., Disp: 1 Inhaler, Rfl: 1   ALPRAZolam (XANAX) 0.25 MG tablet, Take 1 tablet (0.25 mg total) by mouth 2 (two) times daily as needed for anxiety., Disp: 60 tablet, Rfl: 0   ARIPiprazole (ABILIFY) 5 MG tablet, Take 1 tablet (5 mg total) by mouth at bedtime., Disp: 90 tablet, Rfl: 3   cetirizine (ZYRTEC) 10 MG tablet, Take 1 tablet (10 mg total) by mouth daily. (Patient taking differently: Take 10 mg by mouth daily as needed for allergies.), Disp: 30 tablet, Rfl: 0   DULoxetine (CYMBALTA) 30 MG capsule, Take 1 capsule (30 mg total) by mouth daily., Disp: 30 capsule, Rfl: 3   DULoxetine (CYMBALTA) 60 MG capsule, Take 1 capsule (60 mg total) by mouth daily., Disp: 90 capsule, Rfl: 3   Fluticasone-Salmeterol (ADVAIR) 250-50 MCG/DOSE AEPB, Inhale 1 puff into the lungs 2 (two) times daily., Disp: 180 each, Rfl: 3   gabapentin (NEURONTIN) 100 MG capsule, Take 1 capsule (100 mg total) by mouth 2 (two) times daily for 7 days., Disp: 14 capsule, Rfl: 0   ibuprofen (ADVIL) 600 MG tablet, Take 1 tablet (600 mg total) by mouth every 6 (six) hours as needed for headache, cramping or mild pain., Disp: 30 tablet, Rfl: 0   losartan (COZAAR) 50 MG tablet, Take 1 tablet (50 mg total) by  mouth daily., Disp: 90 tablet, Rfl: 0   mometasone (ELOCON) 0.1 % ointment, APPLY TWICE DAILY AS NEEDED FOR PSORIASIS (Patient taking differently: Apply 1 application  topically 2 (two) times daily as needed (psoriasis).), Disp: 45 g, Rfl: 1   montelukast (SINGULAIR) 10 MG tablet, Take 1 tablet (10 mg total) by mouth at bedtime., Disp: 90 tablet, Rfl: 3   simethicone (MYLICON) 80 MG chewable tablet, Chew 1 tablet (80 mg total) by mouth 2 (two) times daily for 7 days., Disp: 14 tablet, Rfl: 0   traMADol (ULTRAM) 50 MG tablet, Take 1 tablet (50 mg total) by mouth 2 (two) times daily as needed., Disp: 30 tablet, Rfl: 0  Observations/Objective: Patient is well-developed, well-nourished in no acute distress.  Resting comfortably  Head is normocephalic, atraumatic.  No labored breathing.  Speech is clear and coherent with logical content.  Patient is alert and oriented at baseline.    Assessment and Plan: 1. Acute bacterial bronchitis - azithromycin (ZITHROMAX) 250 MG tablet; Take 2 tablets on day 1, then 1 tablet daily on days 2 through 5  Dispense: 6 tablet; Refill: 0 -  promethazine-dextromethorphan (PROMETHAZINE-DM) 6.25-15 MG/5ML syrup; Take 5 mLs by mouth 4 (four) times daily as needed.  Dispense: 118 mL; Refill: 0  - Worsening over a week despite OTC medications - Will treat with Z-pack and Promethazine DM - Can continue Mucinex (plain) - Continue Albuterol - Push fluids.  - Rest.  - Steam and humidifier can help - Seek in person evaluation if worsening or symptoms fail to improve    Follow Up Instructions: I discussed the assessment and treatment plan with the patient. The patient was provided an opportunity to ask questions and all were answered. The patient agreed with the plan and demonstrated an understanding of the instructions.  A copy of instructions were sent to the patient via MyChart unless otherwise noted below.    The patient was advised to call back or seek an  in-person evaluation if the symptoms worsen or if the condition fails to improve as anticipated.  Time:  I spent 12 minutes with the patient via telehealth technology discussing the above problems/concerns.    Mar Daring, PA-C

## 2022-08-22 NOTE — Patient Instructions (Signed)
[facility], thank you for joining Mar Daring, PA-C for today's virtual visit.  While this provider is not your primary care provider (PCP), if your PCP is located in our provider database this encounter information will be shared with them immediately following your visit.   [facility] account gives you access to today's visit and all your visits, tests, and labs performed at [facility] " click here if you don't have a [facility] account or go to [email or web address]  Consent: (Patient) Bridget Maldonado provided verbal consent for this virtual visit at the beginning of the encounter.  Current Medications:  Current Outpatient Medications:    azithromycin (ZITHROMAX) 250 MG tablet, Take 2 tablets on day 1, then 1 tablet daily on days 2 through 5, Disp: 6 tablet, Rfl: 0   promethazine-dextromethorphan (PROMETHAZINE-DM) 6.25-15 MG/5ML syrup, Take 5 mLs by mouth 4 (four) times daily as needed., Disp: 118 mL, Rfl: 0   albuterol (PROVENTIL HFA;VENTOLIN HFA) 108 (90 Base) MCG/ACT inhaler, Inhale 2 puffs into the lungs every 6 (six) hours as needed for wheezing or shortness of breath., Disp: 1 Inhaler, Rfl: 1   ALPRAZolam (XANAX) 0.25 MG tablet, Take 1 tablet (0.25 mg total) by mouth 2 (two) times daily as needed for anxiety., Disp: 60 tablet, Rfl: 0   ARIPiprazole (ABILIFY) 5 MG tablet, Take 1 tablet (5 mg total) by mouth at bedtime., Disp: 90 tablet, Rfl: 3   cetirizine (ZYRTEC) 10 MG tablet, Take 1 tablet (10 mg total) by mouth daily. (Patient taking differently: Take 10 mg by mouth daily as needed for allergies.), Disp: 30 tablet, Rfl: 0   DULoxetine (CYMBALTA) 30 MG capsule, Take 1 capsule (30 mg total) by mouth daily., Disp: 30 capsule, Rfl: 3   DULoxetine (CYMBALTA) 60 MG capsule, Take 1 capsule (60 mg total) by mouth daily., Disp: 90 capsule, Rfl: 3   Fluticasone-Salmeterol (ADVAIR) 250-50 MCG/DOSE AEPB, Inhale 1 puff into the lungs 2 (two)  times daily., Disp: 180 each, Rfl: 3   gabapentin (NEURONTIN) 100 MG capsule, Take 1 capsule (100 mg total) by mouth 2 (two) times daily for 7 days., Disp: 14 capsule, Rfl: 0   ibuprofen (ADVIL) 600 MG tablet, Take 1 tablet (600 mg total) by mouth every 6 (six) hours as needed for headache, cramping or mild pain., Disp: 30 tablet, Rfl: 0   losartan (COZAAR) 50 MG tablet, Take 1 tablet (50 mg total) by mouth daily., Disp: 90 tablet, Rfl: 0   mometasone (ELOCON) 0.1 % ointment, APPLY TWICE DAILY AS NEEDED FOR PSORIASIS (Patient taking differently: Apply 1 application  topically 2 (two) times daily as needed (psoriasis).), Disp: 45 g, Rfl: 1   montelukast (SINGULAIR) 10 MG tablet, Take 1 tablet (10 mg total) by mouth at bedtime., Disp: 90 tablet, Rfl: 3   simethicone (MYLICON) 80 MG chewable tablet, Chew 1 tablet (80 mg total) by mouth 2 (two) times daily for 7 days., Disp: 14 tablet, Rfl: 0   traMADol (ULTRAM) 50 MG tablet, Take 1 tablet (50 mg total) by mouth 2 (two) times daily as needed., Disp: 30 tablet, Rfl: 0   Medications ordered in this encounter:  Meds ordered this encounter  Medications   azithromycin (ZITHROMAX) 250 MG tablet    Sig: Take 2 tablets on day 1, then 1 tablet daily on days 2 through 5    Dispense:  6 tablet    Refill:  0    Order Specific Question:   Supervising Provider  Answer:   Chase Picket [id number]   promethazine-dextromethorphan (PROMETHAZINE-DM) 6.25-15 MG/5ML syrup    Sig: Take 5 mLs by mouth 4 (four) times daily as needed.    Dispense:  118 mL    Refill:  0    Order Specific Question:   Supervising Provider    Answer:   Chase Picket [id number]     *If you need refills on other medications prior to your next appointment, please contact your pharmacy*  Follow-Up: Call back or seek an in-person evaluation if the symptoms worsen or if the condition fails to improve as anticipated.  [place] [phone]  Other  Instructions  Acute Bronchitis, Adult  Acute bronchitis is sudden inflammation of the main airways (bronchi) that come off the windpipe (trachea) in the lungs. The swelling causes the airways to get smaller and make more mucus than normal. This can make it hard to breathe and can cause coughing or noisy breathing (wheezing). Acute bronchitis may last several weeks. The cough may last longer. Allergies, asthma, and exposure to smoke may make the condition worse. What are the causes? This condition can be caused by germs and by substances that irritate the lungs, including: Cold and flu viruses. The most common cause of this condition is the virus that causes the common cold. Bacteria. This is less common. Breathing in substances that irritate the lungs, including: Smoke from cigarettes and other forms of tobacco. Dust and pollen. Fumes from household cleaning products, gases, or burned fuel. Indoor or outdoor air pollution. What increases the risk? The following factors may make you more likely to develop this condition: A weak body's defense system, also called the immune system. A condition that affects your lungs and breathing, such as asthma. What are the signs or symptoms? Common symptoms of this condition include: Coughing. This may bring up clear, yellow, or green mucus from your lungs (sputum). Wheezing. Runny or stuffy nose. Having too much mucus in your lungs (chest congestion). Shortness of breath. Aches and pains, including sore throat or chest. How is this diagnosed? This condition is usually diagnosed based on: Your symptoms and medical history. A physical exam. You may also have other tests, including tests to rule out other conditions, such as pneumonia. These tests include: A test of lung function. Test of a mucus sample to look for the presence of bacteria. Tests to check the oxygen level in your blood. Blood tests. Chest X-ray. How is this treated? Most cases  of acute bronchitis clear up over time without treatment. Your health care provider may recommend: Drinking more fluids to help thin your mucus so it is easier to cough up. Taking inhaled medicine (inhaler) to improve air flow in and out of your lungs. Using a vaporizer or a humidifier. These are machines that add water to the air to help you breathe better. Taking a medicine that thins mucus and clears congestion (expectorant). Taking a medicine that prevents or stops coughing (cough suppressant). It is not common to take an antibiotic medicine for this condition. Follow these instructions at home:  Take over-the-counter and prescription medicines only as told by your health care provider. Use an inhaler, vaporizer, or humidifier as told by your health care provider. Take two teaspoons (10 mL) of honey at bedtime to lessen coughing at night. Drink enough fluid to keep your urine pale yellow. Do not use any products that contain nicotine or tobacco. These products include cigarettes, chewing tobacco, and vaping devices, such  as e-cigarettes. If you need help quitting, ask your health care provider. Get plenty of rest. Return to your normal activities as told by your health care provider. Ask your health care provider what activities are safe for you. Keep all follow-up visits. This is important. How is this prevented? To lower your risk of getting this condition again: Wash your hands often with soap and water for at least 20 seconds. If soap and water are not available, use hand sanitizer. Avoid contact with people who have cold symptoms. Try not to touch your mouth, nose, or eyes with your hands. Avoid breathing in smoke or chemical fumes. Breathing smoke or chemical fumes will make your condition worse. Get the flu shot every year. Contact a health care provider if: Your symptoms do not improve after 2 weeks. You have trouble coughing up the mucus. Your cough keeps you awake at  night. You have a fever. Get help right away if you: Cough up blood. Feel pain in your chest. Have severe shortness of breath. Faint or keep feeling like you are going to faint. Have a severe headache. Have a fever or chills that get worse. These symptoms may represent a serious problem that is an emergency. Do not wait to see if the symptoms will go away. Get medical help right away. Call your local emergency services (911 in the [place] Do not drive yourself to the hospital. Summary Acute bronchitis is inflammation of the main airways (bronchi) that come off the windpipe (trachea) in the lungs. The swelling causes the airways to get smaller and make more mucus than normal. Drinking more fluids can help thin your mucus so it is easier to cough up. Take over-the-counter and prescription medicines only as told by your health care provider. Do not use any products that contain nicotine or tobacco. These products include cigarettes, chewing tobacco, and vaping devices, such as e-cigarettes. If you need help quitting, ask your health care provider. Contact a health care provider if your symptoms do not improve after 2 weeks. This information is not intended to replace advice given to you by your health care provider. Make sure you discuss any questions you have with your health care provider. Document Revised: 09/19/2021 Document Reviewed: 10/10/2020 Elsevier Patient Education  [place].    If you have been instructed to have an in-person evaluation today at a local Urgent Care facility, please use the link below. It will take you to a list of all of our available [facility], including address, phone number and hours of operation. Please do not delay care.  [facility]  If you or a family member do not have a primary care provider, use the link below to schedule a visit and establish care. When you choose a [facility] primary care physician or advanced  practice provider, you gain a long-term partner in health. Find a Primary Care Provider  Learn more about [facility]'s in-office and virtual care options: [facility]

## 2022-08-28 ENCOUNTER — Other Ambulatory Visit: Payer: Self-pay | Admitting: Internal Medicine

## 2022-08-28 MED ORDER — TRAMADOL HCL 50 MG PO TABS: 50.0000 mg | ORAL_TABLET | Freq: Two times a day (BID) | ORAL | 0 refills | Status: DC | PRN

## 2022-08-28 NOTE — Telephone Encounter (Signed)
Last filled 07-30-22 #60 Last OV Acute 03-11-22 Next OV 09-17-22 [place]

## 2022-09-17 ENCOUNTER — Encounter: Payer: Self-pay | Admitting: Internal Medicine

## 2022-09-17 ENCOUNTER — Ambulatory Visit (INDEPENDENT_AMBULATORY_CARE_PROVIDER_SITE_OTHER): Payer: No Typology Code available for payment source | Admitting: Internal Medicine

## 2022-09-17 VITALS — BP 124/88 | HR 78 | Temp 98.1°F | Ht 64.0 in | Wt 214.0 lb

## 2022-09-17 DIAGNOSIS — I10 Essential (primary) hypertension: Secondary | ICD-10-CM

## 2022-09-17 DIAGNOSIS — Z0000 Encounter for general adult medical examination without abnormal findings: Secondary | ICD-10-CM

## 2022-09-17 DIAGNOSIS — L4050 Arthropathic psoriasis, unspecified: Secondary | ICD-10-CM

## 2022-09-17 DIAGNOSIS — F319 Bipolar disorder, unspecified: Secondary | ICD-10-CM

## 2022-09-17 DIAGNOSIS — M545 Low back pain, unspecified: Secondary | ICD-10-CM

## 2022-09-17 DIAGNOSIS — G8929 Other chronic pain: Secondary | ICD-10-CM

## 2022-09-17 DIAGNOSIS — R7303 Prediabetes: Secondary | ICD-10-CM | POA: Diagnosis not present

## 2022-09-17 DIAGNOSIS — J4530 Mild persistent asthma, uncomplicated: Secondary | ICD-10-CM

## 2022-09-17 LAB — COMPREHENSIVE METABOLIC PANEL
ALT: 13 U/L (ref 0–35)
AST: 16 U/L (ref 0–37)
Albumin: 4.2 g/dL (ref 3.5–5.2)
Alkaline Phosphatase: 123 U/L — ABNORMAL HIGH (ref 39–117)
BUN: 16 mg/dL (ref 6–23)
CO2: 27 mEq/L (ref 19–32)
Calcium: 9.2 mg/dL (ref 8.4–10.5)
Chloride: 105 mEq/L (ref 96–112)
Creatinine, Ser: 0.71 mg/dL (ref 0.40–1.20)
GFR: 106.91 mL/min (ref >60.00)
Glucose, Bld: 91 mg/dL (ref 70–99)
Potassium: 4.1 mEq/L (ref 3.5–5.1)
Sodium: 140 mEq/L (ref 135–145)
Total Bilirubin: 0.3 mg/dL (ref 0.2–1.2)
Total Protein: 7.4 g/dL (ref 6.0–8.3)

## 2022-09-17 LAB — LIPID PANEL
Cholesterol: 182 mg/dL (ref 0–200)
HDL: 63.8 mg/dL (ref >39.00)
LDL Cholesterol: 103 mg/dL — ABNORMAL HIGH (ref 0–99)
NonHDL: 118.13
Total CHOL/HDL Ratio: 3
Triglycerides: 74 mg/dL (ref 0.0–149.0)
VLDL: 14.8 mg/dL (ref 0.0–40.0)

## 2022-09-17 LAB — CBC
HCT: 42.8 % (ref 36.0–46.0)
Hemoglobin: 13.9 g/dL (ref 12.0–15.0)
MCHC: 32.5 g/dL (ref 30.0–36.0)
MCV: 82.2 fl (ref 78.0–100.0)
Platelets: 260 10*3/uL (ref 150.0–400.0)
RBC: 5.21 Mil/uL — ABNORMAL HIGH (ref 3.87–5.11)
RDW: 14.9 % (ref 11.5–15.5)
WBC: 5.2 10*3/uL (ref 4.0–10.5)

## 2022-09-17 LAB — TSH: TSH: 1.34 u[IU]/mL (ref 0.35–5.50)

## 2022-09-17 LAB — HEMOGLOBIN A1C: Hgb A1c MFr Bld: 5.1 % (ref 4.6–6.5)

## 2022-09-17 MED ORDER — MONTELUKAST SODIUM 10 MG PO TABS: 10.0000 mg | ORAL_TABLET | Freq: Every day | ORAL | 3 refills | Status: DC

## 2022-09-17 MED ORDER — ALPRAZOLAM 0.25 MG PO TABS: 0.2500 mg | ORAL_TABLET | Freq: Two times a day (BID) | ORAL | 0 refills | Status: DC | PRN

## 2022-09-17 MED ORDER — DULOXETINE HCL 30 MG PO CPEP: 30.0000 mg | ORAL_CAPSULE | Freq: Every day | ORAL | 3 refills | Status: DC

## 2022-09-17 NOTE — Assessment & Plan Note (Signed)
Uses the tramadol

## 2022-09-17 NOTE — Assessment & Plan Note (Signed)
Looking to get back in with rheumatologist

## 2022-09-17 NOTE — Assessment & Plan Note (Signed)
Healthy but needs to work on fitness and healthier eating Flu vaccine in the fall She prefers no COVID vaccine

## 2022-09-17 NOTE — Progress Notes (Signed)
Subjective:    Patient ID: Bridget Maldonado, female    DOB: 25-Dec-1980, 42 y.o.   MRN: [id number]  HPI Here for physical  Doing fairly well Just got insurance back--started a new job Ran out of all her medications for several months---"a little rough" Now separated for the past week---much less stress (heart rate and BP down) Daughter is okay---is in behavioral therapy for autism/ADHD (anger issues). Marital stress definitely affected her  Did go to psychiatrist twice---wasn't excited about those Nicolasa Ducking) No recurrence of "breakdown" Does get irritated easier---focus and sleep are not quite normal Will occasionally get panic like symptoms  Still has albuterol inhaler--but asthma not really flaring off the advair On her allergy meds   Concerned about her weight Did lose some weight off abilify Doesn't binge Some stress eating  Current Outpatient Medications on File Prior to Visit  Medication Sig Dispense Refill   albuterol (PROVENTIL HFA;VENTOLIN HFA) 108 (90 Base) MCG/ACT inhaler Inhale 2 puffs into the lungs every 6 (six) hours as needed for wheezing or shortness of breath. 1 Inhaler 1   ALPRAZolam (XANAX) 0.25 MG tablet Take 1 tablet (0.25 mg total) by mouth 2 (two) times daily as needed for anxiety. 60 tablet 0   mometasone (ELOCON) 0.1 % ointment APPLY TWICE DAILY AS NEEDED FOR PSORIASIS (Patient taking differently: Apply 1 application  topically 2 (two) times daily as needed (psoriasis).) 45 g 1   montelukast (SINGULAIR) 10 MG tablet Take 1 tablet (10 mg total) by mouth at bedtime. 90 tablet 3   traMADol (ULTRAM) 50 MG tablet Take 1 tablet (50 mg total) by mouth 2 (two) times daily as needed. 30 tablet 0   DULoxetine (CYMBALTA) 30 MG capsule Take 1 capsule (30 mg total) by mouth daily. (Patient not taking: Reported on 09/17/2022) 30 capsule 3   gabapentin (NEURONTIN) 100 MG capsule Take 1 capsule (100 mg total) by mouth 2 (two) times daily for 7 days. 14 capsule 0   losartan  (COZAAR) 50 MG tablet Take 1 tablet (50 mg total) by mouth daily. 90 tablet 0   No current facility-administered medications on file prior to visit.    Allergies  Allergen Reactions   Sulfa Antibiotics Nausea And Vomiting   Cipro [Ciprofloxacin Hcl] Nausea And Vomiting   Effexor [Venlafaxine Hcl] Other (See Comments)    Excessive sleeping, slept for 2 days    Past Medical History:  Diagnosis Date   Abnormal uterine bleeding 03/04/2022   Anxiety    Possible bipolar, Type II   Arthritis    Asthma    Bipolar disorder ([place])    BRCA negative 07/2017   vistaseq neg   Chronic headaches    Dysfunctional uterine bleeding 10/07/2021   Endometriosis 2018   Family history of breast cancer    Hemorrhoids 08/2007   Hypertension    IBS (irritable bowel syndrome) 08/2007   Ileitis, 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   Increased risk of breast cancer 07/2017   IBIS=40%   Kidney infection    Pylonephritis when pregnant   LGSIL on Pap smear of cervix    Migraines    Obese    Psoriatic arthritis ([place])    Secondary oligomenorrhea 07/16/2017    Past Surgical History:  Procedure Laterality Date   BREAST BIOPSY Left 8 years   CESAREAN SECTION     COLONOSCOPY     COLONOSCOPY W/ BIOPSIES     CYSTOSCOPY N/A 03/04/2022   Procedure: CYSTOSCOPY;  Surgeon: Aletha Halim, MD;  Location:  [facility];  Service: Gynecology;  Laterality: N/A;   EAR TUBE REMOVAL     ENDOMETRIAL BIOPSY  12/23/2021   ESOPHAGOGASTRODUODENOSCOPY     LAPAROSCOPIC CHOLECYSTECTOMY  6/09   Dr. Hulen Skains   TOTAL LAPAROSCOPIC HYSTERECTOMY WITH SALPINGECTOMY Bilateral 03/04/2022   Procedure: TOTAL LAPAROSCOPIC HYSTERECTOMY WITH SALPINGECTOMY;  Surgeon: Aletha Halim, MD;  Location: [facility];  Service: Gynecology;  Laterality: Bilateral;   TUBAL LIGATION  2017   TYMPANOSTOMY TUBE PLACEMENT      Family History  Problem Relation Age of Onset   Colon polyps Maternal Grandfather    Parkinson's disease Maternal Grandfather    Breast cancer Mother  36   Rheum arthritis Mother    Irritable bowel syndrome Mother    Asthma Father    Anxiety disorder Father        Severe, possible bipolar   Irritable bowel syndrome Maternal Grandmother    Diabetes Other        mat. great uncles   Alzheimer's disease Other        Dad's side   Colon cancer Other        mat GGF    Social History   Socioeconomic History   Marital status: Legally Separated    Spouse name: Not on file   Number of children: 1   Years of education: Not on file   Highest education level: Not on file  Occupational History   Occupation: Administration    Employer: [employer]   Occupation: Accounting    Comment: [employer]  Tobacco Use   Smoking status: Never    Passive exposure: Past   Smokeless tobacco: Never  Vaping Use   Vaping Use: Never used  Substance and Sexual Activity   Alcohol use: No    Alcohol/week: 0.0 standard drinks of alcohol   Drug use: No   Sexual activity: Yes    Birth control/protection: Surgical  Other Topics Concern   Not on file  Social History Narrative   Married 10/22   Social Determinants of Health   Financial Resource Strain: Not on file  Food Insecurity: Not on file  Transportation Needs: Not on file  Physical Activity: Not on file  Stress: Not on file  Social Connections: Not on file  Intimate Partner Violence: Not on file   Review of Systems  Constitutional:        Weight has fluctuated Some energy issues Wears seat belt Has been walking and now has gym at work  HENT:  Negative for dental problem, hearing loss, tinnitus and trouble swallowing.        Overdue for dentist  Eyes:  Negative for visual disturbance.       No diplopia or unilateral vision loss  Respiratory:  Negative for cough, chest tightness and shortness of breath.   Cardiovascular:  Positive for palpitations. Negative for chest pain and leg swelling.  Gastrointestinal:  Negative for blood in stool and constipation.       Occ heartburn--tums helps   Endocrine: Negative for polydipsia and polyuria.  Genitourinary:  Negative for dysuria and hematuria.  Musculoskeletal:  Positive for arthralgias and back pain. Negative for joint swelling.       Chronic pain --trying to get back with a rheumatologist  Skin:        Psoriasis flares with stress  Allergic/Immunologic: Positive for environmental allergies. Negative for immunocompromised state.  Neurological:  Positive for headaches. Negative for dizziness, syncope and light-headedness.  Hematological:  Negative for adenopathy. Does not bruise/bleed easily.  Psychiatric/Behavioral:  The patient is nervous/anxious.        Sleep has been a problem--"my mind doesn't shut off" Melatonin helped a bit  Very mild depression only       Objective:   Physical Exam Constitutional:      Appearance: Normal appearance.  HENT:     Mouth/Throat:     Pharynx: No oropharyngeal exudate or posterior oropharyngeal erythema.  Eyes:     Conjunctiva/sclera: Conjunctivae normal.     Pupils: Pupils are equal, round, and reactive to light.  Cardiovascular:     Rate and Rhythm: Normal rate and regular rhythm.     Pulses: Normal pulses.     Heart sounds: No murmur heard.    No gallop.  Pulmonary:     Effort: Pulmonary effort is normal.     Breath sounds: Normal breath sounds. No wheezing or rales.  Abdominal:     Palpations: Abdomen is soft.     Tenderness: There is no abdominal tenderness.  Musculoskeletal:     Cervical back: Neck supple.     Right lower leg: No edema.     Left lower leg: No edema.  Lymphadenopathy:     Cervical: No cervical adenopathy.  Skin:    Findings: No rash.  Neurological:     General: No focal deficit present.     Mental Status: She is alert and oriented to person, place, and time.  Psychiatric:        Mood and Affect: Mood normal.        Behavior: Behavior normal.            Assessment & Plan:

## 2022-09-17 NOTE — Assessment & Plan Note (Signed)
Mostly has the irritability now Will restart low dose duloxetine--but hold off on abilify Xanax for prn only

## 2022-09-17 NOTE — Assessment & Plan Note (Signed)
BP Readings from Last 3 Encounters:  09/17/22 124/88  04/17/22 (!) 151/106  03/09/22 (!) 171/127   BP okay without the losartan

## 2022-09-17 NOTE — Patient Instructions (Signed)

## 2022-09-17 NOTE — Assessment & Plan Note (Signed)
DASH info

## 2022-09-17 NOTE — Assessment & Plan Note (Signed)
Will try off the advair (hasn't been on) Montelukast/prn albuterol

## 2022-10-01 ENCOUNTER — Other Ambulatory Visit: Payer: Self-pay | Admitting: Internal Medicine

## 2022-10-01 NOTE — Telephone Encounter (Signed)
Last filled 08-28-22 #60 Last OV Acute 09-17-22 CPE Next OV 12-30-22 [facility]

## 2022-10-02 MED ORDER — TRAMADOL HCL 50 MG PO TABS: 50.0000 mg | ORAL_TABLET | Freq: Two times a day (BID) | ORAL | 0 refills | Status: DC | PRN

## 2022-10-30 ENCOUNTER — Other Ambulatory Visit: Payer: Self-pay | Admitting: Internal Medicine

## 2022-10-30 MED ORDER — TRAMADOL HCL 50 MG PO TABS: 50.0000 mg | ORAL_TABLET | Freq: Two times a day (BID) | ORAL | 0 refills | Status: DC | PRN

## 2022-10-30 NOTE — Telephone Encounter (Signed)
Last filled 10-02-22 #60 Last OV Acute 09-17-22 CPE Next OV 12-30-22 [facility]

## 2022-11-10 ENCOUNTER — Other Ambulatory Visit: Payer: Self-pay | Admitting: Internal Medicine

## 2022-11-10 MED ORDER — ALPRAZOLAM 0.25 MG PO TABS: 0.2500 mg | ORAL_TABLET | Freq: Two times a day (BID) | ORAL | 0 refills | Status: DC | PRN

## 2022-11-10 NOTE — Telephone Encounter (Signed)
Last filled 09-17-22 #60 Last OV 09-17-22 Next OV 12-30-22 [facility]

## 2022-11-27 ENCOUNTER — Other Ambulatory Visit: Payer: Self-pay | Admitting: Internal Medicine

## 2022-11-27 MED ORDER — TRAMADOL HCL 50 MG PO TABS: 50.0000 mg | ORAL_TABLET | Freq: Two times a day (BID) | ORAL | 0 refills | Status: DC | PRN

## 2022-11-27 NOTE — Telephone Encounter (Signed)
Last filled 10-30-22 #60 Last OV Acute 09-17-22 CPE Next OV 12-30-22 [facility]

## 2022-12-02 ENCOUNTER — Encounter: Payer: Self-pay | Admitting: *Deleted

## 2022-12-02 ENCOUNTER — Encounter: Payer: Self-pay | Admitting: Obstetrics and Gynecology

## 2022-12-02 ENCOUNTER — Ambulatory Visit: Payer: 59 | Admitting: Obstetrics and Gynecology

## 2022-12-02 ENCOUNTER — Other Ambulatory Visit: Payer: Self-pay

## 2022-12-02 ENCOUNTER — Other Ambulatory Visit (HOSPITAL_COMMUNITY)
Admission: RE | Admit: 2022-12-02 | Discharge: 2022-12-02 | Disposition: A | Discharge status: Home / Self Care | Payer: 59 | Source: Ambulatory Visit | Attending: Obstetrics and Gynecology | Admitting: Obstetrics and Gynecology

## 2022-12-02 ENCOUNTER — Emergency Department
Admission: EM | Admit: 2022-12-02 | Discharge: 2022-12-02 | Disposition: A | Discharge status: Home / Self Care | Payer: No Typology Code available for payment source | Source: Home / Self Care | Attending: Emergency Medicine | Admitting: Emergency Medicine

## 2022-12-02 ENCOUNTER — Emergency Department: Payer: No Typology Code available for payment source

## 2022-12-02 VITALS — BP 161/103 | HR 81 | Wt 225.0 lb

## 2022-12-02 DIAGNOSIS — N939 Abnormal uterine and vaginal bleeding, unspecified: Secondary | ICD-10-CM | POA: Diagnosis present

## 2022-12-02 DIAGNOSIS — R87619 Unspecified abnormal cytological findings in specimens from cervix uteri: Secondary | ICD-10-CM | POA: Insufficient documentation

## 2022-12-02 DIAGNOSIS — I639 Cerebral infarction, unspecified: Secondary | ICD-10-CM | POA: Diagnosis not present

## 2022-12-02 DIAGNOSIS — R140 Abdominal distension (gaseous): Secondary | ICD-10-CM | POA: Insufficient documentation

## 2022-12-02 DIAGNOSIS — I6389 Other cerebral infarction: Secondary | ICD-10-CM | POA: Diagnosis not present

## 2022-12-02 DIAGNOSIS — L929 Granulomatous disorder of the skin and subcutaneous tissue, unspecified: Secondary | ICD-10-CM

## 2022-12-02 LAB — URINALYSIS, W/ REFLEX TO CULTURE (INFECTION SUSPECTED)
Bilirubin Urine: NEGATIVE
Glucose, UA: NEGATIVE mg/dL
Hgb urine dipstick: NEGATIVE
Ketones, ur: NEGATIVE mg/dL
Leukocytes,Ua: NEGATIVE
Nitrite: NEGATIVE
Protein, ur: NEGATIVE mg/dL
Specific Gravity, Urine: 1.025 (ref 1.005–1.030)
pH: 6 (ref 5.0–8.0)

## 2022-12-02 LAB — COMPREHENSIVE METABOLIC PANEL
ALT: 24 U/L (ref 0–44)
AST: 23 U/L (ref 15–41)
Albumin: 3.8 g/dL (ref 3.5–5.0)
Alkaline Phosphatase: 96 U/L (ref 38–126)
Anion gap: 9 (ref 5–15)
BUN: 18 mg/dL (ref 6–20)
CO2: 22 mmol/L (ref 22–32)
Calcium: 9 mg/dL (ref 8.9–10.3)
Chloride: 106 mmol/L (ref 98–111)
Creatinine, Ser: 0.71 mg/dL (ref 0.44–1.00)
GFR, Estimated: >60 mL/min (ref >60)
Glucose, Bld: 118 mg/dL — ABNORMAL HIGH (ref 70–99)
Potassium: 3.6 mmol/L (ref 3.5–5.1)
Sodium: 137 mmol/L (ref 135–145)
Total Bilirubin: 0.4 mg/dL (ref 0.3–1.2)
Total Protein: 7 g/dL (ref 6.5–8.1)

## 2022-12-02 LAB — CBC WITH DIFFERENTIAL/PLATELET
Abs Immature Granulocytes: 0.02 10*3/uL (ref 0.00–0.07)
Basophils Absolute: 0.1 10*3/uL (ref 0.0–0.1)
Basophils Relative: 1 %
Eosinophils Absolute: 0.3 10*3/uL (ref 0.0–0.5)
Eosinophils Relative: 4 %
HCT: 44 % (ref 36.0–46.0)
Hemoglobin: 14 g/dL (ref 12.0–15.0)
Immature Granulocytes: 0 %
Lymphocytes Relative: 31 %
Lymphs Abs: 2.4 10*3/uL (ref 0.7–4.0)
MCH: 27.2 pg (ref 26.0–34.0)
MCHC: 31.8 g/dL (ref 30.0–36.0)
MCV: 85.4 fL (ref 80.0–100.0)
Monocytes Absolute: 0.3 10*3/uL (ref 0.1–1.0)
Monocytes Relative: 4 %
Neutro Abs: 4.7 10*3/uL (ref 1.7–7.7)
Neutrophils Relative %: 60 %
Platelets: 260 10*3/uL (ref 150–400)
RBC: 5.15 MIL/uL — ABNORMAL HIGH (ref 3.87–5.11)
RDW: 14.4 % (ref 11.5–15.5)
WBC: 7.7 10*3/uL (ref 4.0–10.5)
nRBC: 0 % (ref 0.0–0.2)

## 2022-12-02 LAB — LIPASE, BLOOD: Lipase: 48 U/L (ref 11–51)

## 2022-12-02 LAB — POC URINE PREG, ED: Preg Test, Ur: NEGATIVE

## 2022-12-02 MED ORDER — MORPHINE SULFATE (PF) 4 MG/ML IV SOLN
4.0000 mg | Freq: Once | INTRAVENOUS | Status: AC
  Administered 2022-12-02: 4 mg via INTRAVENOUS
  Filled 2022-12-02: qty 1

## 2022-12-02 MED ORDER — SODIUM CHLORIDE 0.9 % IV BOLUS
1000.0000 mL | Freq: Once | INTRAVENOUS | Status: AC
  Administered 2022-12-02: 1000 mL via INTRAVENOUS

## 2022-12-02 MED ORDER — IOHEXOL 300 MG/ML  SOLN
100.0000 mL | Freq: Once | INTRAMUSCULAR | Status: AC | PRN
  Administered 2022-12-02: 100 mL via INTRAVENOUS

## 2022-12-02 NOTE — Progress Notes (Signed)
Obstetrics and Gynecology Visit Return Patient Evaluation  Appointment Date: 12/02/2022  Primary Care Provider: Karie Schwalbe  OBGYN Clinic: [facility]  Chief Complaint:  Bloating Left belly pain spotting  History of Present Illness:  Bridget Maldonado is a 42 y.o. with h/o Sept 2023 TLH/partial left salpingectomy and lysis of adhesions, h/o c-section x 1, h/o l/s chole, HTN, BMI 30s, h/o abnormal pap  Patient notes bloating of her abdomen over the past week. No diarrhea or constipation but she does notes some nausea.   Over the past few months, patient notes occasional pink vaginal spotting and left sided belly discomfort.  Review of Systems:  as noted in the History of Present Illness.  Patient Active Problem List   Diagnosis Date Noted   Prediabetes 09/17/2022   Preventative health care 06/13/2020   Bipolar disease, chronic (HCC) 07/27/2018   Family history of colon cancer 07/27/2017   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   Family history of breast cancer 07/16/2017   Psoriatic arthritis (HCC) 10/28/2016   Chronic low back pain 10/28/2016   Essential (primary) hypertension 10/18/2014   Arthropathia 11/24/2013   Obstipation 04/12/2013   Migraine with status migrainosus 09/08/2012   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   Mild persistent asthma 05/26/2011   Severe anxiety with panic 08/29/2006   Panic disorder without agoraphobia 08/29/2006   Medications:  Tsering Leaman. Hast had no medications administered during this visit. Current Outpatient Medications  Medication Sig Dispense Refill   albuterol (PROVENTIL HFA;VENTOLIN HFA) 108 (90 Base) MCG/ACT inhaler Inhale 2 puffs into the lungs every 6 (six) hours as needed for wheezing or shortness of breath. 1 Inhaler 1   ALPRAZolam (XANAX) 0.25 MG tablet Take 1 tablet (0.25 mg total) by mouth 2 (two) times daily as needed for anxiety. 60 tablet 0   DULoxetine  (CYMBALTA) 30 MG capsule Take 1 capsule (30 mg total) by mouth daily. 30 capsule 3   mometasone (ELOCON) 0.1 % ointment APPLY TWICE DAILY AS NEEDED FOR PSORIASIS (Patient taking differently: Apply 1 application  topically 2 (two) times daily as needed (psoriasis).) 45 g 1   montelukast (SINGULAIR) 10 MG tablet Take 1 tablet (10 mg total) by mouth at bedtime. 90 tablet 3   traMADol (ULTRAM) 50 MG tablet Take 1 tablet (50 mg total) by mouth 2 (two) times daily as needed. 30 tablet 0   No current facility-administered medications for this visit.    Allergies: is allergic to sulfa antibiotics, cipro [ciprofloxacin hcl], and effexor [venlafaxine hcl].  Physical Exam:  BP (!) 161/103   Pulse 81   Wt 225 lb (102.1 kg)   LMP 10/21/2021 (Approximate) Comment: Urine preg negatvie 03/04/22  BMI 38.62 kg/m  Body mass index is 38.62 kg/m. General appearance: Well nourished, well developed female in no acute distress.  Abdomen: moderately to large amount of bloating tympany, rare, bowel sounds, no peritoneal s/s. No evidence hernia, masses Back: no CVAT Neuro/Psych:  Normal mood and affect.    Pelvic exam:  Cervical exam performed in the presence of a chaperone EGBUS: normal Vaginal vault: normal Cuff: small, pinpoint area of granulation tissue at the cuff apex>>silver nitrate applied.  Bimanual: negative   Assessment: pt stable  Plan:  1. Abnormal cervical Papanicolaou smear, unspecified abnormal pap finding Spotting likely from granulation tissue. F/u swabs and pap smear - Cervicovaginal ancillary only( [place]) - Cytology - PAP( [place])  2. Vaginal spotting - Cervicovaginal ancillary only( [place])  3. Bloating Given her surgical history and acute onset, recommend [facility] ED evaluation given concern for some type of pSBO, ileus, or similar disease process. Patient amenable to plan. Charge nurse contacted and notified I was sending the patietn   4. Granulation  tissue  RTC: PRN  Return if symptoms worsen or fail to improve.  Future Appointments  Date Time Provider Department Center  12/30/2022  9:00 AM Karie Schwalbe, MD [facility]    Cornelia Copa MD Attending Center for [facility])

## 2022-12-02 NOTE — ED Triage Notes (Signed)
Pt has abd pain with bloating for 1 week.  Pt saw her ob md today and was sent to er for eval.   Pt denies urinary sx.  No vag bleeding.  Pt had a hysterectomy 9/23.  Pt alert.

## 2022-12-02 NOTE — Progress Notes (Signed)
RGYN pt here for problem visit.  CC: Discomfort since surgery 02/2022.  Feels as if belly is extended.  Cramps pain 6-7/10x. Spotting that appears pinkish has to wear a panty liner. Denies any dysuria.  Denies any vaginal discharge or odor.   Notes intercourse x 1 wk ago denies any pain.

## 2022-12-02 NOTE — Discharge Instructions (Addendum)
You were seen in the ER today for evaluation of bloating with some abdominal pain.  Fortunately your lab work and CT scan did not show an emergency cause for this.  Please call your primary care doctor and OB/GYN as directed.  Return to the ER for any new or worsening symptoms.

## 2022-12-02 NOTE — ED Notes (Signed)
Pt A&O x4, no obvious distress noted, respirations regular/unlabored. Pt verbalizes understanding of discharge instructions. Pt able to ambulate from ED independently.   

## 2022-12-02 NOTE — ED Provider Notes (Signed)
[facility] Provider Note    Event Date/Time   First MD Initiated Contact with Patient 12/02/22 1802     (approximate)   History   Abdominal Pain   HPI  Bridget Maldonado is a 42 y.o. female presenting to the emergency department for evaluation of abdominal distention.  Reports some nausea without vomiting.  No dysuria or urinary frequency.  No vaginal bleeding.  Had a hysterectomy September of last year and saw her OB/GYN today.  They were concerned about an obstructive process in the setting of her multiple prior surgeries and did recommend ER presentation.    Physical Exam   Triage Vital Signs: ED Triage Vitals  Enc Vitals Group     BP 12/02/22 1723 (!) 159/106     Pulse Rate 12/02/22 1723 88     Resp 12/02/22 1723 16     Temp 12/02/22 1723 98.8 F (37.1 C)     Temp Source 12/02/22 1723 Oral     SpO2 12/02/22 1723 94 %     Weight 12/02/22 1720 220 lb (99.8 kg)     Height 12/02/22 1720 5\' 4"  (1.626 m)     Head Circumference --      Peak Flow --      Pain Score 12/02/22 1720 5     Pain Loc --      Pain Edu? --      Excl. in GC? --     Most recent vital signs: Vitals:   12/02/22 1723  BP: (!) 159/106  Pulse: 88  Resp: 16  Temp: 98.8 F (37.1 C)  SpO2: 94%     General: Awake, interactive  CV:  Regular rate, good peripheral perfusion.  Resp:  Lungs clear, unlabored respirations.  Abd:  Soft, nondistended, mild tenderness to palpation over the left abdomen, remainder of abdomen nontender Neuro:  Symmetric facial movement, fluid speech   ED Results / Procedures / Treatments   Labs (all labs ordered are listed, but only abnormal results are displayed) Labs Reviewed  COMPREHENSIVE METABOLIC PANEL - Abnormal; Notable for the following components:      Result Value   Glucose, Bld 118 (*)    All other components within normal limits  CBC WITH DIFFERENTIAL/PLATELET - Abnormal; Notable for the following components:   RBC 5.15 (*)     All other components within normal limits  URINALYSIS, W/ REFLEX TO CULTURE (INFECTION SUSPECTED) - Abnormal; Notable for the following components:   Color, Urine YELLOW (*)    APPearance CLEAR (*)    Bacteria, UA RARE (*)    All other components within normal limits  LIPASE, BLOOD  POC URINE PREG, ED     EKG EKG independently reviewed interpreted by myself (ER attending) demonstrates:    RADIOLOGY Imaging independently reviewed and interpreted by myself demonstrates:    PROCEDURES:  Critical Care performed: No  Procedures   MEDICATIONS ORDERED IN ED: Medications  sodium chloride 0.9 % bolus 1,000 mL (1,000 mLs Intravenous New Bag/Given 12/02/22 1852)  morphine (PF) 4 MG/ML injection 4 mg (4 mg Intravenous Given 12/02/22 1853)  iohexol (OMNIPAQUE) 300 MG/ML solution 100 mL (100 mLs Intravenous Contrast Given 12/02/22 1900)     IMPRESSION / MDM / ASSESSMENT AND PLAN / ED COURSE  I reviewed the triage vital signs and the nursing notes.  Differential diagnosis includes, but is not limited to, bowel obstruction, ileus, constipation, diverticulitis, other acute intra-abdominal process, lower suspicion ovarian torsion based on history, consideration  for ovarian cyst  Patient's presentation is most consistent with acute presentation with potential threat to life or bodily function.  42 year old female presenting to the emergency department for evaluation of abdominal distention with some abdominal discomfort on exam.  Lab work, urine, and CT scan reassuring.  Specifically without evidence of bowel obstruction which was concern from GYN office.  Patient reevaluated and updated on results of her workup.  She is comfortable with discharge home.  Strict return precautions were provided.  She was discharged stable condition.      FINAL CLINICAL IMPRESSION(S) / ED DIAGNOSES   Final diagnoses:  Abdominal bloating     Rx / DC Orders   ED Discharge Orders     None         Note:  This document was prepared using Dragon voice recognition software and may include unintentional dictation errors.   Trinna Post, MD 12/02/22 2022

## 2022-12-03 ENCOUNTER — Telehealth: Payer: Self-pay

## 2022-12-03 NOTE — Transitions of Care (Post Inpatient/ED Visit) (Signed)
Unable to reach pt by phone and left v/m requesting cb [phone].      12/03/2022  Name: Bridget Maldonado [facility] MRN: [id number] DOB: [birth year]  Today's TOC FU Call Status: Today's TOC FU Call Status:: Unsuccessul Call (1st Attempt) Unsuccessful Call (1st Attempt) Date: 12/03/22  Attempted to reach the patient regarding the most recent Inpatient/ED visit.  Follow Up Plan: Additional outreach attempts will be made to reach the patient to complete the Transitions of Care (Post Inpatient/ED visit) call.   Signature  Lewanda Rife, LPN

## 2022-12-03 NOTE — Transitions of Care (Post Inpatient/ED Visit) (Signed)
I tried calling pt back but unable to reach by phone and left v/m requesting pt to cb [phone].      12/03/2022  Name: Bridget Maldonado [facility] MRN: [id number] DOB: 1980/10/02  Today's TOC FU Call Status: Today's TOC FU Call Status:: Unsuccessful Call (2nd Attempt) Unsuccessful Call (1st Attempt) Date: 12/03/22 Unsuccessful Call (2nd Attempt) Date: 12/03/22  Attempted to reach the patient regarding the most recent Inpatient/ED visit.  Follow Up Plan: Additional outreach attempts will be made to reach the patient to complete the Transitions of Care (Post Inpatient/ED visit) call.   Signature Lewanda Rife, LPN

## 2022-12-03 NOTE — Telephone Encounter (Signed)
Patient returned transitions of care call to [facility]. Would like a call back please.

## 2022-12-04 ENCOUNTER — Inpatient Hospital Stay
Admission: EM | Admit: 2022-12-04 | Discharge: 2022-12-09 | DRG: 062 | Disposition: A | Discharge status: Home / Self Care | Payer: No Typology Code available for payment source | Attending: Internal Medicine | Admitting: Internal Medicine

## 2022-12-04 ENCOUNTER — Emergency Department: Payer: No Typology Code available for payment source

## 2022-12-04 ENCOUNTER — Other Ambulatory Visit: Payer: Self-pay | Admitting: Internal Medicine

## 2022-12-04 ENCOUNTER — Other Ambulatory Visit: Payer: Self-pay

## 2022-12-04 DIAGNOSIS — R140 Abdominal distension (gaseous): Secondary | ICD-10-CM | POA: Diagnosis present

## 2022-12-04 DIAGNOSIS — Z818 Family history of other mental and behavioral disorders: Secondary | ICD-10-CM | POA: Diagnosis not present

## 2022-12-04 DIAGNOSIS — Z881 Allergy status to other antibiotic agents status: Secondary | ICD-10-CM

## 2022-12-04 DIAGNOSIS — R29810 Facial weakness: Secondary | ICD-10-CM | POA: Diagnosis present

## 2022-12-04 DIAGNOSIS — Z803 Family history of malignant neoplasm of breast: Secondary | ICD-10-CM

## 2022-12-04 DIAGNOSIS — G8194 Hemiplegia, unspecified affecting left nondominant side: Secondary | ICD-10-CM | POA: Diagnosis present

## 2022-12-04 DIAGNOSIS — I6389 Other cerebral infarction: Principal | ICD-10-CM | POA: Diagnosis present

## 2022-12-04 DIAGNOSIS — F319 Bipolar disorder, unspecified: Secondary | ICD-10-CM | POA: Diagnosis present

## 2022-12-04 DIAGNOSIS — R5381 Other malaise: Secondary | ICD-10-CM | POA: Diagnosis present

## 2022-12-04 DIAGNOSIS — I10 Essential (primary) hypertension: Secondary | ICD-10-CM | POA: Diagnosis present

## 2022-12-04 DIAGNOSIS — E876 Hypokalemia: Secondary | ICD-10-CM

## 2022-12-04 DIAGNOSIS — Z9079 Acquired absence of other genital organ(s): Secondary | ICD-10-CM | POA: Diagnosis not present

## 2022-12-04 DIAGNOSIS — Z882 Allergy status to sulfonamides status: Secondary | ICD-10-CM

## 2022-12-04 DIAGNOSIS — E669 Obesity, unspecified: Secondary | ICD-10-CM | POA: Diagnosis present

## 2022-12-04 DIAGNOSIS — Z90710 Acquired absence of both cervix and uterus: Secondary | ICD-10-CM | POA: Diagnosis not present

## 2022-12-04 DIAGNOSIS — Z79899 Other long term (current) drug therapy: Secondary | ICD-10-CM

## 2022-12-04 DIAGNOSIS — R4781 Slurred speech: Secondary | ICD-10-CM | POA: Diagnosis present

## 2022-12-04 DIAGNOSIS — L4050 Arthropathic psoriasis, unspecified: Secondary | ICD-10-CM | POA: Diagnosis present

## 2022-12-04 DIAGNOSIS — Z8249 Family history of ischemic heart disease and other diseases of the circulatory system: Secondary | ICD-10-CM | POA: Diagnosis not present

## 2022-12-04 DIAGNOSIS — R471 Dysarthria and anarthria: Secondary | ICD-10-CM | POA: Diagnosis present

## 2022-12-04 DIAGNOSIS — Z833 Family history of diabetes mellitus: Secondary | ICD-10-CM

## 2022-12-04 DIAGNOSIS — Z888 Allergy status to other drugs, medicaments and biological substances status: Secondary | ICD-10-CM

## 2022-12-04 DIAGNOSIS — R260 Ataxic gait: Secondary | ICD-10-CM | POA: Diagnosis present

## 2022-12-04 DIAGNOSIS — Z9049 Acquired absence of other specified parts of digestive tract: Secondary | ICD-10-CM

## 2022-12-04 DIAGNOSIS — F419 Anxiety disorder, unspecified: Secondary | ICD-10-CM | POA: Diagnosis present

## 2022-12-04 DIAGNOSIS — Z1231 Encounter for screening mammogram for malignant neoplasm of breast: Secondary | ICD-10-CM

## 2022-12-04 DIAGNOSIS — Z800 Family history of malignant neoplasm of digestive organs: Secondary | ICD-10-CM | POA: Diagnosis not present

## 2022-12-04 DIAGNOSIS — I639 Cerebral infarction, unspecified: Secondary | ICD-10-CM | POA: Diagnosis present

## 2022-12-04 DIAGNOSIS — Z8261 Family history of arthritis: Secondary | ICD-10-CM

## 2022-12-04 DIAGNOSIS — I160 Hypertensive urgency: Secondary | ICD-10-CM | POA: Diagnosis present

## 2022-12-04 DIAGNOSIS — Z820 Family history of epilepsy and other diseases of the nervous system: Secondary | ICD-10-CM | POA: Diagnosis not present

## 2022-12-04 DIAGNOSIS — R29704 NIHSS score 4: Secondary | ICD-10-CM | POA: Diagnosis present

## 2022-12-04 DIAGNOSIS — Z825 Family history of asthma and other chronic lower respiratory diseases: Secondary | ICD-10-CM

## 2022-12-04 DIAGNOSIS — J45909 Unspecified asthma, uncomplicated: Secondary | ICD-10-CM | POA: Diagnosis present

## 2022-12-04 DIAGNOSIS — Z6838 Body mass index (BMI) 38.0-38.9, adult: Secondary | ICD-10-CM | POA: Diagnosis not present

## 2022-12-04 DIAGNOSIS — N809 Endometriosis, unspecified: Secondary | ICD-10-CM | POA: Diagnosis present

## 2022-12-04 DIAGNOSIS — Z83719 Family history of colon polyps, unspecified: Secondary | ICD-10-CM

## 2022-12-04 LAB — COMPREHENSIVE METABOLIC PANEL
ALT: 20 U/L (ref 0–44)
AST: 20 U/L (ref 15–41)
Albumin: 3.6 g/dL (ref 3.5–5.0)
Alkaline Phosphatase: 104 U/L (ref 38–126)
Anion gap: 8 (ref 5–15)
BUN: 14 mg/dL (ref 6–20)
CO2: 24 mmol/L (ref 22–32)
Calcium: 8.9 mg/dL (ref 8.9–10.3)
Chloride: 107 mmol/L (ref 98–111)
Creatinine, Ser: 0.77 mg/dL (ref 0.44–1.00)
GFR, Estimated: >60 mL/min (ref >60)
Glucose, Bld: 131 mg/dL — ABNORMAL HIGH (ref 70–99)
Potassium: 3.3 mmol/L — ABNORMAL LOW (ref 3.5–5.1)
Sodium: 139 mmol/L (ref 135–145)
Total Bilirubin: 0.3 mg/dL (ref 0.3–1.2)
Total Protein: 7 g/dL (ref 6.5–8.1)

## 2022-12-04 LAB — PROTIME-INR
INR: 1 (ref 0.8–1.2)
Prothrombin Time: 13.3 seconds (ref 11.4–15.2)

## 2022-12-04 LAB — CBC
HCT: 41.9 % (ref 36.0–46.0)
Hemoglobin: 13.5 g/dL (ref 12.0–15.0)
MCH: 27.3 pg (ref 26.0–34.0)
MCHC: 32.2 g/dL (ref 30.0–36.0)
MCV: 84.8 fL (ref 80.0–100.0)
Platelets: 262 10*3/uL (ref 150–400)
RBC: 4.94 MIL/uL (ref 3.87–5.11)
RDW: 14.4 % (ref 11.5–15.5)
WBC: 8 10*3/uL (ref 4.0–10.5)
nRBC: 0 % (ref 0.0–0.2)

## 2022-12-04 LAB — CERVICOVAGINAL ANCILLARY ONLY
Bacterial Vaginitis (gardnerella): POSITIVE — AB
Candida Glabrata: NEGATIVE
Candida Vaginitis: NEGATIVE
Chlamydia: NEGATIVE
Comment: NEGATIVE
Comment: NEGATIVE
Comment: NEGATIVE
Comment: NEGATIVE
Comment: NEGATIVE
Comment: NORMAL
Neisseria Gonorrhea: NEGATIVE
Trichomonas: NEGATIVE

## 2022-12-04 LAB — CBG MONITORING, ED: Glucose-Capillary: 136 mg/dL — ABNORMAL HIGH (ref 70–99)

## 2022-12-04 LAB — DIFFERENTIAL
Abs Immature Granulocytes: 0.02 10*3/uL (ref 0.00–0.07)
Basophils Absolute: 0 10*3/uL (ref 0.0–0.1)
Basophils Relative: 0 %
Eosinophils Absolute: 0.2 10*3/uL (ref 0.0–0.5)
Eosinophils Relative: 2 %
Immature Granulocytes: 0 %
Lymphocytes Relative: 23 %
Lymphs Abs: 1.8 10*3/uL (ref 0.7–4.0)
Monocytes Absolute: 0.3 10*3/uL (ref 0.1–1.0)
Monocytes Relative: 4 %
Neutro Abs: 5.6 10*3/uL (ref 1.7–7.7)
Neutrophils Relative %: 71 %

## 2022-12-04 LAB — APTT: aPTT: 26 seconds (ref 24–36)

## 2022-12-04 LAB — MRSA NEXT GEN BY PCR, NASAL: MRSA by PCR Next Gen: NOT DETECTED

## 2022-12-04 LAB — ETHANOL: Alcohol, Ethyl (B): <10 mg/dL (ref <10)

## 2022-12-04 MED ORDER — ACETAMINOPHEN 650 MG RE SUPP: 650.0000 mg | RECTAL | Status: DC | PRN

## 2022-12-04 MED ORDER — BUTALBITAL-APAP-CAFFEINE 50-325-40 MG PO TABS
1.0000 | ORAL_TABLET | Freq: Four times a day (QID) | ORAL | Status: DC | PRN
  Administered 2022-12-04 – 2022-12-06 (×4): 1 via ORAL
  Administered 2022-12-09: 2 via ORAL
  Filled 2022-12-04 (×4): qty 1
  Filled 2022-12-04: qty 2
  Filled 2022-12-04: qty 1

## 2022-12-04 MED ORDER — LABETALOL HCL 5 MG/ML IV SOLN
10.0000 mg | Freq: Once | INTRAVENOUS | Status: AC
  Administered 2022-12-04: 10 mg via INTRAVENOUS

## 2022-12-04 MED ORDER — ACETAMINOPHEN 325 MG PO TABS
650.0000 mg | ORAL_TABLET | ORAL | Status: DC | PRN
  Administered 2022-12-04 – 2022-12-07 (×2): 650 mg via ORAL
  Filled 2022-12-04 (×2): qty 2

## 2022-12-04 MED ORDER — CLEVIDIPINE BUTYRATE 0.5 MG/ML IV EMUL
0.0000 mg/h | INTRAVENOUS | Status: DC
  Administered 2022-12-04: 6 mg/h via INTRAVENOUS
  Administered 2022-12-04: 2 mg/h via INTRAVENOUS
  Filled 2022-12-04: qty 50

## 2022-12-04 MED ORDER — POTASSIUM CHLORIDE CRYS ER 20 MEQ PO TBCR
40.0000 meq | EXTENDED_RELEASE_TABLET | Freq: Once | ORAL | Status: AC
  Administered 2022-12-04: 40 meq via ORAL
  Filled 2022-12-04: qty 2

## 2022-12-04 MED ORDER — SODIUM CHLORIDE 0.9% FLUSH: 3.0000 mL | Freq: Once | INTRAVENOUS | Status: DC

## 2022-12-04 MED ORDER — PANTOPRAZOLE SODIUM 40 MG IV SOLR
40.0000 mg | Freq: Every day | INTRAVENOUS | Status: DC
  Administered 2022-12-04 – 2022-12-05 (×2): 40 mg via INTRAVENOUS
  Filled 2022-12-04 (×2): qty 10

## 2022-12-04 MED ORDER — ACETAMINOPHEN 160 MG/5ML PO SOLN: 650.0000 mg | ORAL | Status: DC | PRN

## 2022-12-04 MED ORDER — TENECTEPLASE FOR STROKE
0.2500 mg/kg | PACK | Freq: Once | INTRAVENOUS | Status: AC
  Administered 2022-12-04: 25 mg via INTRAVENOUS

## 2022-12-04 MED ORDER — SENNOSIDES-DOCUSATE SODIUM 8.6-50 MG PO TABS: 1.0000 | ORAL_TABLET | Freq: Every evening | ORAL | Status: DC | PRN

## 2022-12-04 MED ORDER — LABETALOL HCL 5 MG/ML IV SOLN: 20.0000 mg | Freq: Once | INTRAVENOUS | Status: DC

## 2022-12-04 MED ORDER — STROKE: EARLY STAGES OF RECOVERY BOOK: Freq: Once | Status: AC

## 2022-12-04 MED ORDER — TRAMADOL HCL 50 MG PO TABS
50.0000 mg | ORAL_TABLET | Freq: Two times a day (BID) | ORAL | Status: DC | PRN
  Administered 2022-12-04 – 2022-12-08 (×4): 50 mg via ORAL
  Filled 2022-12-04 (×4): qty 1

## 2022-12-04 NOTE — H&P (Signed)
NAME:  Bridget Maldonado, MRN:  [id number], DOB:  30-Oct-1980, LOS: 0 ADMISSION DATE:  12/04/2022, CONSULTATION DATE: 12/04/2022 REFERRING MD: Dr. Modesto Charon, CHIEF COMPLAINT: Left-sided weakness/slurred speech  History of Present Illness:  This is a 42 yo female who presented to [facility] ER on 06/13 with c/o left sided arm and facial numbness along with slurred speech.  Prior to symptoms pt reports she had a 7/10 bifrontal nonthrobbing headache without visual changes or nausea.  Her last known well time was 1300 on 06/13.    ED Course  Upon arrival to the ER code stroke initiated.  CT Head negative for acute intracranial hemorrhage or evidence of evolving large vessel territory infarct, however ASPECT score 10.  Per neurologist Dr. Otelia Limes due to pt exam inconsistencies (see consult note) held off on decision making regarding TNK until he reviewed stat MRI Brain/MRA Head results.  MRI Brain revealed punctate focus of restricted diffusion in the left parietal lobe consistent with acute/subacute infarct.  Therefore, TNK ordered per neurology recommendations.  However, pt hypertensive sbp 150 to 170's and dbp 100's.  Per neurology recommendations antihypertensive medications administered for better bp control prior to administration of TNK.  Pt reports she was previously on po metoprolol for bp management, however this was discontinued by her PCP in March 2024 because her bp was controlled at that time.  However, she states she feels like the metoprolol did not give her adequate bp control when she was taking it.  PCCM team contacted for ICU admission post TNK.    MRI Brain/MRA Head: Punctate focus of restricted diffusion in the left parietal lobe, consistent with acute/subacute infarct. Multiple small T2 hyperintense foci in the left parietal lobe, adjacent to the focus of restricted diffusion, suggestive of prior small infarcts. No high-grade flow-limiting stenosis or proximal branch occlusion of the intracranial  circulation.  Pertinent  Medical History  Abnormal Uterine Bleeding  Anxiety Arthritis  Asthma Bipolar Disorder Chronic Headaches  Endometriosis  Hemorrhoids  IBS Terminal Ileitis  Obesity  Psoriatic Arthritis   Significant Hospital Events: Including procedures, antibiotic start and stop dates in addition to other pertinent events   06/13: Pt admitted with hypertensive urgency and acute CVA s/p TKA requiring cleviprex gtt for bp management   Interim History / Subjective:  Pt complains of 6/10 headache and slurred speech   Objective   Blood pressure (!) 158/107, pulse 95, resp. rate 18, height 5\' 4"  (1.626 m), weight 101.7 kg, last menstrual period 10/21/2021, SpO2 97 %.       No intake or output data in the 24 hours ending 12/04/22 1505 Filed Weights   12/04/22 1341  Weight: 101.7 kg    Examination: General: Acute on chronically-ill appearing female, NAD on RA  HENT: Supple, no JVD  Lungs: Clear throughout, even, non labored  Cardiovascular: NSR, s1s2, no m/r/g, 2+ radial/2+ distal pulses, no edema Abdomen: +BS x4, obese, soft, non tender, non distended  Extremities: Normal bulk and tone Neuro: Aphasic with LUE/LLE drift present, left-sided hemiparesis, alert and oriented, PERRLA  GU: Voiding   Resolved Hospital Problem list     Assessment & Plan:  #Acute/Subacute CVA s/p TNK #Hypertension urgency  - Continuous telemetry monitoring  - Neuro checks per NIH stroke scale  - Stat CT Head for acute neurological changes - Prn labetalol or cleviprex gtt to maintain sbp 130~150 and dbp <105 - Avoid indwelling catheters or procedures for 24 hrs post TNK  - No antiplatelet medication or anticoagulation 24 hrs  post TNK  - Echo, fasting lipid panel, and hemoglobin A1c pending  - NPO until able to pass bedside swallowing evaluation  - VTE px: SCD's  - PT/OT/Speech  - Repeat CT Head 24 hrs post TNK   #Hypokalemia  - Trend BMP  - Replace electrolytes as indicated  -  Monitor UOP   Best Practice (right click and "Reselect all SmartList Selections" daily)   Diet/type: NPO DVT prophylaxis: SCD GI prophylaxis: PPI Lines: N/A Foley:  N/A Code Status:  full code Last date of multidisciplinary goals of care discussion [N/A]  06/13: Pt updated regarding current plan of care and all questions answered  Labs   CBC: Recent Labs  Lab 12/02/22 1723  WBC 7.7  NEUTROABS 4.7  HGB 14.0  HCT 44.0  MCV 85.4  PLT 260    Basic Metabolic Panel: Recent Labs  Lab 12/02/22 1723  NA 137  K 3.6  CL 106  CO2 22  GLUCOSE 118*  BUN 18  CREATININE 0.71  CALCIUM 9.0   GFR: Estimated Creatinine Clearance: 109.5 mL/min (by C-G formula based on SCr of 0.71 mg/dL). Recent Labs  Lab 12/02/22 1723  WBC 7.7    Liver Function Tests: Recent Labs  Lab 12/02/22 1723  AST 23  ALT 24  ALKPHOS 96  BILITOT 0.4  PROT 7.0  ALBUMIN 3.8   Recent Labs  Lab 12/02/22 1723  LIPASE 48   No results for input(s): "AMMONIA" in the last 168 hours.  ABG No results found for: "PHART", "PCO2ART", "PO2ART", "HCO3", "TCO2", "ACIDBASEDEF", "O2SAT"   Coagulation Profile: No results for input(s): "INR", "PROTIME" in the last 168 hours.  Cardiac Enzymes: No results for input(s): "CKTOTAL", "CKMB", "CKMBINDEX", "TROPONINI" in the last 168 hours.  HbA1C: Hgb A1c MFr Bld  Date/Time Value Ref Range Status  09/17/2022 08:51 AM 5.1 4.6 - 6.5 % Final    Comment:    Glycemic Control Guidelines for People with Diabetes:Non Diabetic:  <6%Goal of Therapy: <7%Additional Action Suggested:  >8%   07/16/2017 09:14 AM 5.0 4.8 - 5.6 % Final    Comment:             Prediabetes: 5.7 - 6.4          Diabetes: >6.4          Glycemic control for adults with diabetes: <7.0     CBG: Recent Labs  Lab 12/04/22 1337  GLUCAP 136*    Review of Systems: Positives in BOLD   Gen: Denies fever, chills, weight change, fatigue, night sweats HEENT: Denies blurred vision, double vision,  hearing loss, tinnitus, sinus congestion, rhinorrhea, sore throat, neck stiffness, dysphagia PULM: Denies shortness of breath, cough, sputum production, hemoptysis, wheezing CV: Denies chest pain, edema, orthopnea, paroxysmal nocturnal dyspnea, palpitations GI: Denies abdominal pain, nausea, vomiting, diarrhea, hematochezia, melena, constipation, change in bowel habits GU: Denies dysuria, hematuria, polyuria, oliguria, urethral discharge Endocrine: Denies hot or cold intolerance, polyuria, polyphagia or appetite change Derm: Denies rash, dry skin, scaling or peeling skin change Heme: Denies easy bruising, bleeding, bleeding gums Neuro: headache, left facial/arm numbness, weakness, slurred speech, loss of memory or consciousness   Past Medical History:  She,  has a past medical history of Abnormal uterine bleeding (03/04/2022), Anxiety, Arthritis, Asthma, Bipolar disorder (HCC), BRCA negative (07/2017), Chronic headaches, Dysfunctional uterine bleeding (10/07/2021), Endometriosis (2018), Family history of breast cancer, Hemorrhoids (08/2007), Hypertension, IBS (irritable bowel syndrome) (08/2007), Ileitis, terminal (HCC) (08/2007), Increased risk of breast cancer (07/2017), Kidney infection, LGSIL on  Pap smear of cervix, Migraines, Obese, Psoriatic arthritis (HCC), and Secondary oligomenorrhea (07/16/2017).   Surgical History:   Past Surgical History:  Procedure Laterality Date   BREAST BIOPSY Left 8 years   CESAREAN SECTION     COLONOSCOPY     COLONOSCOPY W/ BIOPSIES     CYSTOSCOPY N/A 03/04/2022   Procedure: CYSTOSCOPY;  Surgeon: Waveland Bing, MD;  Location: [facility] OR;  Service: Gynecology;  Laterality: N/A;   EAR TUBE REMOVAL     ENDOMETRIAL BIOPSY  12/23/2021   ESOPHAGOGASTRODUODENOSCOPY     LAPAROSCOPIC CHOLECYSTECTOMY  6/09   Dr. Lindie Spruce   TOTAL LAPAROSCOPIC HYSTERECTOMY WITH SALPINGECTOMY Bilateral 03/04/2022   Procedure: TOTAL LAPAROSCOPIC HYSTERECTOMY WITH SALPINGECTOMY;  Surgeon:  Tilden Bing, MD;  Location: [facility] OR;  Service: Gynecology;  Laterality: Bilateral;   TUBAL LIGATION  2017   TYMPANOSTOMY TUBE PLACEMENT       Social History:   reports that she has never smoked. She has been exposed to tobacco smoke. She has never used smokeless tobacco. She reports that she does not drink alcohol and does not use drugs.   Family History:  Her family history includes Alzheimer's disease in an other family member; Anxiety disorder in her father; Asthma in her father; Breast cancer (age of onset: 21) in her mother; Colon cancer in an other family member; Colon polyps in her maternal grandfather; Diabetes in an other family member; Irritable bowel syndrome in her maternal grandmother and mother; Parkinson's disease in her maternal grandfather; Rheum arthritis in her mother.   Allergies Allergies  Allergen Reactions   Sulfa Antibiotics Nausea And Vomiting   Cipro [Ciprofloxacin Hcl] Nausea And Vomiting   Effexor [Venlafaxine Hcl] Other (See Comments)    Excessive sleeping, slept for 2 days     Home Medications  Prior to Admission medications   Medication Sig Start Date End Date Taking? Authorizing Provider  albuterol (PROVENTIL HFA;VENTOLIN HFA) 108 (90 Base) MCG/ACT inhaler Inhale 2 puffs into the lungs every 6 (six) hours as needed for wheezing or shortness of breath. 04/16/16   Karie Schwalbe, MD  ALPRAZolam Prudy Feeler) 0.25 MG tablet Take 1 tablet (0.25 mg total) by mouth 2 (two) times daily as needed for anxiety. 11/10/22   Karie Schwalbe, MD  DULoxetine (CYMBALTA) 30 MG capsule Take 1 capsule (30 mg total) by mouth daily. 09/17/22   Karie Schwalbe, MD  mometasone (ELOCON) 0.1 % ointment APPLY TWICE DAILY AS NEEDED FOR PSORIASIS Patient taking differently: Apply 1 application  topically 2 (two) times daily as needed (psoriasis). 10/15/18   Karie Schwalbe, MD  montelukast (SINGULAIR) 10 MG tablet Take 1 tablet (10 mg total) by mouth at bedtime. 09/17/22   Karie Schwalbe, MD  traMADol (ULTRAM) 50 MG tablet Take 1 tablet (50 mg total) by mouth 2 (two) times daily as needed. 11/27/22   Eulis Foster, FNP     Critical care time: 70 minutes      Zada Girt, [facility]  Pulmonary/Critical Care Pager [phone] (please enter 7 digits) PCCM Consult Pager [phone] (please enter 7 digits)

## 2022-12-04 NOTE — Progress Notes (Signed)
This Clinical research associate was notified by pt's RN, Apolinar Junes of pt having new lower gum bleeding. Pt assessed by this writer at the bedside- minimal amount of bleeding of the lower gums  noted. This Clinical research associate called and notified neurologist MD Lindzen at [phone]. No new orders at this time. Pt instructed to notify nursing staff for worsening bleeding of gums.   Leafy Ro, RN, Stroke Coordinator

## 2022-12-04 NOTE — ED Triage Notes (Signed)
Pt to ED for left sided numbness, arm and face started 30 mins ago. Slurred speech started 30 mins ago.  Cbg 136 BP 174/118, states took bp meds today

## 2022-12-04 NOTE — Consult Note (Signed)
CODE STROKE- PHARMACY COMMUNICATION  Time CODE STROKE called/page received: 1342  Time response to CODE STROKE was made (in person): 1350  Time Stroke Kit retrieved from Pyxis: 1348  Name of Provider/Nurse contacted: Caryl Pina, D  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 Authorizing Provider  albuterol (PROVENTIL HFA;VENTOLIN HFA) 108 (90 Base) MCG/ACT inhaler Inhale 2 puffs into the lungs every 6 (six) hours as needed for wheezing or shortness of breath. 04/16/16   Karie Schwalbe, MD  ALPRAZolam Prudy Feeler) 0.25 MG tablet Take 1 tablet (0.25 mg total) by mouth 2 (two) times daily as needed for anxiety. 11/10/22   Karie Schwalbe, MD  DULoxetine (CYMBALTA) 30 MG capsule Take 1 capsule (30 mg total) by mouth daily. 09/17/22   Karie Schwalbe, MD  mometasone (ELOCON) 0.1 % ointment APPLY TWICE DAILY AS NEEDED FOR PSORIASIS Patient taking differently: Apply 1 application  topically 2 (two) times daily as needed (psoriasis). 10/15/18   Karie Schwalbe, MD  montelukast (SINGULAIR) 10 MG tablet Take 1 tablet (10 mg total) by mouth at bedtime. 09/17/22   Karie Schwalbe, MD  traMADol (ULTRAM) 50 MG tablet Take 1 tablet (50 mg total) by mouth 2 (two) times daily as needed. 11/27/22   Eulis Foster, FNP    Celene Squibb, PharmD PGY1 Pharmacy Resident 12/04/2022 3:16 PM

## 2022-12-04 NOTE — Progress Notes (Signed)
   12/04/22 1400  Spiritual Encounters  Type of Visit Initial  Care provided to: Family  Referral source Code page  Reason for visit Code  OnCall Visit Yes  Spiritual Framework  Presenting Themes Meaning/purpose/sources of inspiration;Values and beliefs  Values/beliefs [employer]  Community/Connection Family;Faith community  Patient Stress Factors Major life changes  Family Stress Factors None identified  Intervention Outcomes  Outcomes Connection to spiritual care;Awareness around self/spiritual resourses  Spiritual Care Plan  Spiritual Care Issues Still Outstanding No further spiritual care needs at this time (see row info)    Chaplain received Code Stroke and went to the ED to see the patient. Patient was receiving CT scan when chaplain arrived. Chaplain sat and talked with the patients mother. Mom told the chaplain about some of the stressors that the daughter has been going through. Mom also shared with chaplain about patients daughter and shared sweet stories about their family. Mom received a phone call and thanked the chaplain for coming to see her. Chaplain told her if she needs her to tell the nurses and they will page.

## 2022-12-04 NOTE — ED Notes (Signed)
Patient to CT at this time with Bri, RN

## 2022-12-04 NOTE — ED Notes (Signed)
Scant bleeding of lower front gums. RN notified Dr. Otelia Limes and Stroke Coordinator at bedside.

## 2022-12-04 NOTE — Progress Notes (Signed)
Code Stroke activated @ 1342 with the pt in CT.  Dr. Otelia Limes paged @ 1344 and on camera @ 1354 in CT.  Josiah Lobo BSN, Occupational hygienist

## 2022-12-04 NOTE — Consult Note (Addendum)
NEURO HOSPITALIST CONSULT NOTE   Requesting physician: Dr. Modesto Charon  Reason for Consult: Acute onset of left sided weakness and numbness with left facial droop  History obtained from:  Patient and Chart     HPI:                                                                                                                                          Bridget Maldonado is an 42 y.o. female with a PMHx of endometriosis, abnormal uterine bleeding, anxiety, bipolar disorder, migraine headache, HTN, obesity and psoriatic arthritis who presents to the [facility] ED after experiencing acute onset of left sided numbness with weakness affecting her arm and face, in conjunction with slurred speech. Symptoms were preceded by a 7/10 bifrontal nonthrobbing headache without associated vision changes or nausea. CBG 136. BP 174/118. Left sided deficits were noted in Triage and Code Stroke was called. LKN 1300.   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   Increased risk of breast cancer 07/2017   IBIS=40%   Kidney infection    Pylonephritis when pregnant   LGSIL on Pap smear of cervix    Migraines    Obese    Psoriatic arthritis (HCC)    Secondary oligomenorrhea 07/16/2017    Past Surgical History:  Procedure Laterality Date   BREAST BIOPSY Left 8 years   CESAREAN SECTION     COLONOSCOPY     COLONOSCOPY W/ BIOPSIES     CYSTOSCOPY N/A 03/04/2022   Procedure: CYSTOSCOPY;  Surgeon: [place] Bing, MD;  Location: [facility] OR;  Service: Gynecology;  Laterality: N/A;   EAR TUBE REMOVAL     ENDOMETRIAL  BIOPSY  12/23/2021   ESOPHAGOGASTRODUODENOSCOPY     LAPAROSCOPIC CHOLECYSTECTOMY  6/09   Dr. Lindie Spruce   TOTAL LAPAROSCOPIC HYSTERECTOMY WITH SALPINGECTOMY Bilateral 03/04/2022   Procedure: TOTAL LAPAROSCOPIC HYSTERECTOMY WITH SALPINGECTOMY;  Surgeon: [place] Bing, MD;  Location: [facility] OR;  Service: Gynecology;  Laterality: Bilateral;   TUBAL LIGATION  2017   TYMPANOSTOMY TUBE PLACEMENT      Family History  Problem Relation Age of Onset   Colon polyps Maternal Grandfather    Parkinson's disease Maternal Grandfather    Breast cancer Mother 62   Rheum arthritis Mother    Irritable bowel syndrome Mother  Asthma Father    Anxiety disorder Father        Severe, possible bipolar   Irritable bowel syndrome Maternal Grandmother    Diabetes Other        mat. great uncles   Alzheimer's disease Other        Dad's side   Colon cancer Other        mat GGF             Social History:  reports that she has never smoked. She has been exposed to tobacco smoke. She has never used smokeless tobacco. She reports that she does not drink alcohol and does not use drugs.  Allergies  Allergen Reactions   Sulfa Antibiotics Nausea And Vomiting   Cipro [Ciprofloxacin Hcl] Nausea And Vomiting   Effexor [Venlafaxine Hcl] Other (See Comments)    Excessive sleeping, slept for 2 days    MEDICATIONS:                                                                                                                     No current facility-administered medications on file prior to encounter.   Current Outpatient Medications on File Prior to Encounter  Medication Sig Dispense Refill   albuterol (PROVENTIL HFA;VENTOLIN HFA) 108 (90 Base) MCG/ACT inhaler Inhale 2 puffs into the lungs every 6 (six) hours as needed for wheezing or shortness of breath. 1 Inhaler 1   ALPRAZolam (XANAX) 0.25 MG tablet Take 1 tablet (0.25 mg total) by mouth 2 (two) times daily as needed for anxiety. 60 tablet 0   DULoxetine (CYMBALTA) 30  MG capsule Take 1 capsule (30 mg total) by mouth daily. 30 capsule 3   mometasone (ELOCON) 0.1 % ointment APPLY TWICE DAILY AS NEEDED FOR PSORIASIS (Patient taking differently: Apply 1 application  topically 2 (two) times daily as needed (psoriasis).) 45 g 1   montelukast (SINGULAIR) 10 MG tablet Take 1 tablet (10 mg total) by mouth at bedtime. 90 tablet 3   traMADol (ULTRAM) 50 MG tablet Take 1 tablet (50 mg total) by mouth 2 (two) times daily as needed. 30 tablet 0    Scheduled:  sodium chloride flush  3 mL Intravenous Once     ROS:  As per HPI. Detailed ROS deferred in the context of acuity of presentation.    Blood pressure (!) 174/118, pulse 95, resp. rate 18, height 5\' 4"  (1.626 m), weight 101.7 kg, last menstrual period 10/21/2021, SpO2 97 %.   General Examination:                                                                                                       Physical Exam HEENT- [place]/AT   Lungs- Respirations unlabored Extremities- Warm and well-perfused  Neurological Examination Mental Status: Awake and alert. Fully oriented. Thought content appropriate. Absent-like expressionless affect. Speech with subtle dysarthria, but otherwise fluent with intact naming and comprehension.  Able to follow all commands without difficulty. No hemineglect.  Cranial Nerves: II: Temporal visual fields intact with no extinction to DSS. PERRL. III,IV, VI: No ptosis. EOMI. No nystagmus. V: Temp sensation subjectively decreased on the left.  VII: Smile and grimace are with symmetric movement VIII: Hearing intact to voice IX,X: No hypophonia or hoarseness XI: Symmetric XII: Midline tongue extension Motor: LUE and LLE with giveway weakness that is inconsistent; this inconsistency becomes more pronounced when asking her to maintain resistance against examiner as  examiner rapidly changes the direction and amplitude of the force applied.  There is drift of LUE to a fixed position about 6 inches below level of RUE when testing Barre, but LUE does not drift any lower.  LLE with bobbing drift.  RUE and RLE 5/5 proximally and distally.  Sensory: Temp sensation subjectively decreased to LUE; normal to LLE, RUE and RLE. FT decreased subjectively on the left. No extinction to DSS. Deep Tendon Reflexes: 2+ and symmetric bilateral biceps, brachioradialis and patellae Plantars: Right: downgoingLeft: downgoing Cerebellar: No ataxia with FNF or H-S bilaterally Gait: Deferred  NIHSS: 4   Lab Results: Basic Metabolic Panel: Recent Labs  Lab 12/02/22 1723  NA 137  K 3.6  CL 106  CO2 22  GLUCOSE 118*  BUN 18  CREATININE 0.71  CALCIUM 9.0    CBC: Recent Labs  Lab 12/02/22 1723  WBC 7.7  NEUTROABS 4.7  HGB 14.0  HCT 44.0  MCV 85.4  PLT 260    Cardiac Enzymes: No results for input(s): "CKTOTAL", "CKMB", "CKMBINDEX", "TROPONINI" in the last 168 hours.  Lipid Panel: No results for input(s): "CHOL", "TRIG", "HDL", "CHOLHDL", "VLDL", "LDLCALC" in the last 168 hours.  Imaging: CT ABDOMEN PELVIS W CONTRAST  Result Date: 12/02/2022 CLINICAL DATA:  Left lower quadrant pain EXAM: CT ABDOMEN AND PELVIS WITH CONTRAST TECHNIQUE: Multidetector CT imaging of the abdomen and pelvis was performed using the standard protocol following bolus administration of intravenous contrast. RADIATION DOSE REDUCTION: This exam was performed according to the departmental dose-optimization program which includes automated exposure control, adjustment of the mA and/or kV according to patient size and/or use of iterative reconstruction technique. CONTRAST:  OMNIPAQUE IOHEXOL 300 MG/ML  SOLN COMPARISON:  03/24/2019 FINDINGS: Lower chest: No acute abnormality Hepatobiliary: No focal liver abnormality is seen. Status post cholecystectomy. No biliary dilatation. Pancreas: No  focal abnormality or ductal dilatation. Spleen:  No focal abnormality.  Normal size. Adrenals/Urinary Tract: No adrenal abnormality. No focal renal abnormality. No stones or hydronephrosis. Urinary bladder is unremarkable. Stomach/Bowel: Stomach, large and small bowel grossly unremarkable. Vascular/Lymphatic: No evidence of aneurysm or adenopathy. Reproductive: Prior hysterectomy.  No adnexal masses. Other: No free fluid or free air. Musculoskeletal: No acute bony abnormality. IMPRESSION: No acute findings. Electronically Signed   By: Charlett Nose M.D.   On: 12/02/2022 20:04     Assessment: 42 year old female presenting with acute onset of left sided weakness and numbness with left facial droop - Exam with inconsistencies including left sided giveway weakness that migrates to the right when both arms are tested simultaneously with rapid changes in direction and force applied by examiner, bobbing drift of LLE, functional quality of left drift on Barre testing.  - CT head: No acute intracranial hemorrhage or evidence of evolving large vessel territory infarct. ASPECT score is 10. - Stroke is possible but is felt to be less likely than complicated migraine or functional/stress-related neurological symptoms - Will need STAT MRI/MRA brain to further assess.  - Will hold off on TNK final decision-making until after MRI, as given relatively high likelihood that her presentation is not due to stroke, risks currently are felt to significantly outweigh potential benefits.   Recommendations: - STAT MRI brain with MRA head - Frequent neuro checks - Further recommendations pending MRI.   Addendum: - MRI brain reveals no acute abnormality, but there is a cluster of cortically based punctate hyperintensities in the superior left parietal lobe suggestive of old ischemic infarctions. This significantly increases the probability of the patient's current symptoms being due to acute ischemia - MRA brain without LVO -  After comprehensive review of possible contraindications, she has no absolute contraindications to TNK administration, other than elevated SBP of 115 which would need to be lowered prior to thrombolytic administration. - Patient is an IV thrombolysis candidate. Discussed extensively the risks/benefits of IV thrombolysis treatment vs. no treatment with the patient and her mother, including risks of hemorrhage and death with IV thrombolysis administration versus worse overall outcomes on average in patients within thrombolysis time window who are not administered TNK. Overall benefits of TNK regarding long-term prognosis are felt to outweigh risks. The patient expressed understanding and wish to proceed with TNK.  - Following TNK she will need to be admitted to the ICU - Post-TNK order set to include frequent neuro checks and BP management. Although maximum permissible post-TNK SBP is 180, SBP goal of 130-150 would be optimal given that there is unlikely to be a significant penumbra in the setting of mild signs/symptoms and no LVO on MRA. Clevidipine drip has been ordered.  - No antiplatelet medications or anticoagulants for at least 24 hours following TNK.  - DVT prophylaxis with SCDs.  - Will need to be started on a statin. Obtain LFTs and baseline CK level.  - Will need to be started on antiplatelet therapy if follow up CT at 24 hours is negative for hemorrhagic conversion. - CTA neck - TTE.  - PT/OT/Speech.  - NPO until passes swallow evaluation.  - Telemetry monitoring - Fasting lipid panel, HgbA1c - Risk factor modification including weight loss and light exercise  Electronically signed: Dr. Caryl Pina 12/04/2022, 1:48 PM

## 2022-12-04 NOTE — ED Notes (Signed)
Pt is having small amount of bleeding of the lower front gums.

## 2022-12-04 NOTE — ED Provider Notes (Signed)
[facility] Provider Note    Event Date/Time   First MD Initiated Contact with Patient 12/04/22 1341     (approximate)   History   Code Stroke   HPI  Bridget Maldonado is a 42 y.o. female   Past medical history of anxiety, bipolar, endometriosis, hypertension, migraines who presents to the emergency department as a stroke code due to left-sided motor or sensory deficits.  Started around 1 PM which was her last known normal.  She also has slurred speech and headache.  Patient brought immediately to CT scanner and evaluated by neurology, see their note for full evaluation and physical exam.  Was deemed a TNK candidate due to symptomatology as well as MRI brain with findings consistent with acute/subacute infarct to the left parietal lobe, TNK administered after blood pressure control and administered approximately 3 PM.  To ICU for post TNKase monitoring.       Physical Exam   Triage Vital Signs: ED Triage Vitals  Enc Vitals Group     BP 12/04/22 1337 (!) 174/118     Pulse Rate 12/04/22 1337 95     Resp 12/04/22 1337 18     Temp --      Temp src --      SpO2 12/04/22 1337 97 %     Weight 12/04/22 1341 224 lb 4.8 oz (101.7 kg)     Height 12/04/22 1338 5\' 4"  (1.626 m)     Head Circumference --      Peak Flow --      Pain Score --      Pain Loc --      Pain Edu? --      Excl. in GC? --     Most recent vital signs: Vitals:   12/04/22 1600 12/04/22 1615  BP:  (!) 155/96  Pulse: 83 98  Resp: 12 17  Temp:  98.5 F (36.9 C)  SpO2: 99% 97%    General: Awake, no distress.  CV:  Good peripheral perfusion.  Resp:  Normal effort.  Abd:  No distention.  Other:  Awake alert responsive does not appear to be in any acute distress, vital signs remarkable for hypertension.   ED Results / Procedures / Treatments   Labs (all labs ordered are listed, but only abnormal results are displayed) Labs Reviewed  COMPREHENSIVE METABOLIC PANEL - Abnormal;  Notable for the following components:      Result Value   Potassium 3.3 (*)    Glucose, Bld 131 (*)    All other components within normal limits  CBG MONITORING, ED - Abnormal; Notable for the following components:   Glucose-Capillary 136 (*)    All other components within normal limits  PROTIME-INR  APTT  CBC  DIFFERENTIAL  ETHANOL  HIV ANTIBODY (ROUTINE TESTING W REFLEX)     I ordered and reviewed the above labs they are notable for glucose 130s  EKG  ED ECG REPORT I, Pilar Jarvis, the attending physician, personally viewed and interpreted this ECG.   Date: 12/04/2022  EKG Time: 1453  Rate: 83  Rhythm: nsr  Axis: nl  Intervals:none  ST&T Change: no stemi    RADIOLOGY I independently reviewed and interpreted CT scan of the head see no obvious bleeding or midline shift   PROCEDURES:  Critical Care performed: Yes, see critical care procedure note(s)  .Critical Care  Performed by: Pilar Jarvis, MD Authorized by: Pilar Jarvis, MD   Critical care provider statement:  Critical care time (minutes):  30   Critical care was time spent personally by me on the following activities:  Development of treatment plan with patient or surrogate, discussions with consultants, evaluation of patient's response to treatment, examination of patient, ordering and review of laboratory studies, ordering and review of radiographic studies, ordering and performing treatments and interventions, pulse oximetry, re-evaluation of patient's condition and review of old charts    MEDICATIONS ORDERED IN ED: Medications  sodium chloride flush (NS) 0.9 % injection 3 mL (3 mLs Intravenous Not Given 12/04/22 1400)  labetalol (NORMODYNE) injection 20 mg (20 mg Intravenous Not Given 12/04/22 1510)    And  clevidipine (CLEVIPREX) infusion 0.5 mg/mL (2 mg/hr Intravenous Infusion Verify 12/04/22 1620)   stroke: early stages of recovery book (has no administration in time range)  acetaminophen (TYLENOL)  tablet 650 mg (has no administration in time range)    Or  acetaminophen (TYLENOL) 160 MG/5ML solution 650 mg (has no administration in time range)    Or  acetaminophen (TYLENOL) suppository 650 mg (has no administration in time range)  senna-docusate (Senokot-S) tablet 1 tablet (has no administration in time range)  pantoprazole (PROTONIX) injection 40 mg (has no administration in time range)  labetalol (NORMODYNE) injection 10 mg (10 mg Intravenous Given 12/04/22 1410)  labetalol (NORMODYNE) injection 10 mg (10 mg Intravenous Given 12/04/22 1455)  tenecteplase (TNKASE) injection for Stroke 25 mg (25 mg Intravenous Given 12/04/22 1501)    External physician / consultants:  I spoke with neurologist Dr. Otelia Limes regarding care plan for this patient.   IMPRESSION / MDM / ASSESSMENT AND PLAN / ED COURSE  I reviewed the triage vital signs and the nursing notes.                                Patient's presentation is most consistent with acute presentation with potential threat to life or bodily function.  Differential diagnosis includes, but is not limited to, CVA, electrolyte disturbance, complex migraine   The patient is on the cardiac monitor to evaluate for evidence of arrhythmia and/or significant heart rate changes.  MDM: Patient with acute stroke identified TNK administered after blood pressure control, admitted to ICU for post TN K monitoring.         FINAL CLINICAL IMPRESSION(S) / ED DIAGNOSES   Final diagnoses:  Cerebrovascular accident (CVA), unspecified mechanism (HCC)     Rx / DC Orders   ED Discharge Orders     None        Note:  This document was prepared using Dragon voice recognition software and may include unintentional dictation errors.    Pilar Jarvis, MD 12/04/22 [phone]

## 2022-12-04 NOTE — Code Documentation (Addendum)
Stroke Response Nurse Documentation Code Documentation  Bridget Maldonado is a 42 y.o. female arriving to [facility] via [employer] [facility] on 12/04/2022 with past medical hx of anxiety, bipolar, HTN, chronic headaches, asthma, endometriosis, dysfunctional uterine bleeding, hysterectomy in 2023. On No antithrombotic. Code stroke was activated by ED.   Patient from work where she was LKW at 1200 and now complaining of left sided weakness, slurred speech. Patient reports waking up and going to work this morning in her normal state of health. She went to lunch around 1200 feeling normal and when she came back, she endorses feeling like her left arm felt heavy and came to the ED for eval.   Stroke team meets patient in CT after code stroke activation. Patient to CT with team. NIHSS 8, see documentation for details and code stroke times. Patient with disoriented, left facial droop, left arm weakness, left leg weakness, left decreased sensation, Expressive aphasia , and dysarthria  on exam. The following imaging was completed:  CT Head and MRI. Patient is a candidate for IV Thrombolytic. TNK given at 1501. Pt requiring anti-hypertensive prior to thrombolytic administration. Patient is not a candidate for IR due to no LVO on imaging per MD.   Care Plan: post-thrombolytic protocol: VS/NIHSS every 15 minutes x 2 hours> VS/NIHSS every 30 minutes x 6 hours, then every hour x16. Swallow screen per order. BP <180/105.   Bedside handoff with ED RN Apolinar Junes.    Wille Glaser  Stroke Response RN

## 2022-12-05 ENCOUNTER — Inpatient Hospital Stay (HOSPITAL_COMMUNITY)
Admit: 2022-12-05 | Discharge: 2022-12-05 | Disposition: A | Discharge status: Home / Self Care | Payer: No Typology Code available for payment source | Attending: Critical Care Medicine | Admitting: Critical Care Medicine

## 2022-12-05 ENCOUNTER — Inpatient Hospital Stay: Payer: No Typology Code available for payment source

## 2022-12-05 DIAGNOSIS — I639 Cerebral infarction, unspecified: Secondary | ICD-10-CM | POA: Diagnosis not present

## 2022-12-05 DIAGNOSIS — I6389 Other cerebral infarction: Secondary | ICD-10-CM

## 2022-12-05 LAB — LIPID PANEL
Cholesterol: 202 mg/dL — ABNORMAL HIGH (ref 0–200)
HDL: 58 mg/dL (ref >40)
LDL Cholesterol: 131 mg/dL — ABNORMAL HIGH (ref 0–99)
Total CHOL/HDL Ratio: 3.5 RATIO
Triglycerides: 67 mg/dL (ref <150)
VLDL: 13 mg/dL (ref 0–40)

## 2022-12-05 LAB — COMPREHENSIVE METABOLIC PANEL
ALT: 20 U/L (ref 0–44)
AST: 19 U/L (ref 15–41)
Albumin: 3.7 g/dL (ref 3.5–5.0)
Alkaline Phosphatase: 104 U/L (ref 38–126)
Anion gap: 7 (ref 5–15)
BUN: 13 mg/dL (ref 6–20)
CO2: 23 mmol/L (ref 22–32)
Calcium: 8.4 mg/dL — ABNORMAL LOW (ref 8.9–10.3)
Chloride: 107 mmol/L (ref 98–111)
Creatinine, Ser: 0.6 mg/dL (ref 0.44–1.00)
GFR, Estimated: >60 mL/min (ref >60)
Glucose, Bld: 99 mg/dL (ref 70–99)
Potassium: 3.6 mmol/L (ref 3.5–5.1)
Sodium: 137 mmol/L (ref 135–145)
Total Bilirubin: 0.6 mg/dL (ref 0.3–1.2)
Total Protein: 7.2 g/dL (ref 6.5–8.1)

## 2022-12-05 LAB — ECHOCARDIOGRAM COMPLETE
AR max vel: 2.15 cm2
AV Area VTI: 2.43 cm2
AV Area mean vel: 2.45 cm2
AV Mean grad: 3 mmHg
AV Peak grad: 5.8 mmHg
Ao pk vel: 1.2 m/s
Area-P 1/2: 3.24 cm2
Height: 64 in
S' Lateral: 2.9 cm
Weight: 3588.8 oz

## 2022-12-05 LAB — CBC WITH DIFFERENTIAL/PLATELET
Abs Immature Granulocytes: 0.02 10*3/uL (ref 0.00–0.07)
Basophils Absolute: 0.1 10*3/uL (ref 0.0–0.1)
Basophils Relative: 1 %
Eosinophils Absolute: 0.2 10*3/uL (ref 0.0–0.5)
Eosinophils Relative: 3 %
HCT: 44.3 % (ref 36.0–46.0)
Hemoglobin: 14 g/dL (ref 12.0–15.0)
Immature Granulocytes: 0 %
Lymphocytes Relative: 28 %
Lymphs Abs: 2 10*3/uL (ref 0.7–4.0)
MCH: 26.8 pg (ref 26.0–34.0)
MCHC: 31.6 g/dL (ref 30.0–36.0)
MCV: 84.9 fL (ref 80.0–100.0)
Monocytes Absolute: 0.3 10*3/uL (ref 0.1–1.0)
Monocytes Relative: 5 %
Neutro Abs: 4.4 10*3/uL (ref 1.7–7.7)
Neutrophils Relative %: 63 %
Platelets: 250 10*3/uL (ref 150–400)
RBC: 5.22 MIL/uL — ABNORMAL HIGH (ref 3.87–5.11)
RDW: 14.6 % (ref 11.5–15.5)
WBC: 7 10*3/uL (ref 4.0–10.5)
nRBC: 0 % (ref 0.0–0.2)

## 2022-12-05 LAB — HEMOGLOBIN A1C
Hgb A1c MFr Bld: 4.9 % (ref 4.8–5.6)
Mean Plasma Glucose: 93.93 mg/dL

## 2022-12-05 LAB — ANTITHROMBIN III: AntiThromb III Func: 98 % (ref 75–120)

## 2022-12-05 LAB — CK: Total CK: 48 U/L (ref 38–234)

## 2022-12-05 LAB — CYTOLOGY - PAP
Comment: NEGATIVE
Diagnosis: UNDETERMINED — AB
High risk HPV: NEGATIVE

## 2022-12-05 LAB — MAGNESIUM: Magnesium: 2.1 mg/dL (ref 1.7–2.4)

## 2022-12-05 LAB — PHOSPHORUS: Phosphorus: 3.5 mg/dL (ref 2.5–4.6)

## 2022-12-05 LAB — HIV ANTIBODY (ROUTINE TESTING W REFLEX): HIV Screen 4th Generation wRfx: NONREACTIVE

## 2022-12-05 LAB — GLUCOSE, CAPILLARY: Glucose-Capillary: 128 mg/dL — ABNORMAL HIGH (ref 70–99)

## 2022-12-05 MED ORDER — LISINOPRIL 5 MG PO TABS
5.0000 mg | ORAL_TABLET | Freq: Every day | ORAL | Status: DC
  Administered 2022-12-05 – 2022-12-09 (×5): 5 mg via ORAL
  Filled 2022-12-05 (×5): qty 1

## 2022-12-05 MED ORDER — CHLORHEXIDINE GLUCONATE CLOTH 2 % EX PADS
6.0000 | MEDICATED_PAD | Freq: Every day | CUTANEOUS | Status: DC
  Administered 2022-12-05 – 2022-12-06 (×2): 6 via TOPICAL

## 2022-12-05 MED ORDER — IOHEXOL 350 MG/ML SOLN
75.0000 mL | Freq: Once | INTRAVENOUS | Status: AC | PRN
  Administered 2022-12-05: 75 mL via INTRAVENOUS

## 2022-12-05 MED ORDER — STERILE WATER FOR INJECTION IJ SOLN
INTRAMUSCULAR | Status: AC
  Administered 2022-12-05: 10 mL via INTRAVENOUS
  Filled 2022-12-05: qty 10

## 2022-12-05 NOTE — Progress Notes (Signed)
*  PRELIMINARY RESULTS* Echocardiogram 2D Echocardiogram has been performed.  Bridget Maldonado 12/05/2022, 10:39 AM

## 2022-12-05 NOTE — Progress Notes (Signed)
NAME:  IVOREE KRUMWIEDE, MRN:  [id number], DOB:  03-Jul-1980, LOS: 1 ADMISSION DATE:  12/04/2022, CONSULTATION DATE: 12/04/2022 REFERRING MD: Dr. Modesto Charon, CHIEF COMPLAINT: Left-sided weakness/slurred speech  History of Present Illness:  This is a 42 yo female who presented to [facility] ER on 06/13 with c/o left sided arm and facial numbness along with slurred speech.  Prior to symptoms pt reports she had a 7/10 bifrontal nonthrobbing headache without visual changes or nausea.  Her last known well time was 1300 on 06/13.    ED Course  Upon arrival to the ER code stroke initiated.  CT Head negative for acute intracranial hemorrhage or evidence of evolving large vessel territory infarct, however ASPECT score 10.  Per neurologist Dr. Otelia Limes due to pt exam inconsistencies (see consult note) held off on decision making regarding TNK until he reviewed stat MRI Brain/MRA Head results.  MRI Brain revealed punctate focus of restricted diffusion in the left parietal lobe consistent with acute/subacute infarct.  Therefore, TNK ordered per neurology recommendations.  However, pt hypertensive sbp 150 to 170's and dbp 100's.  Per neurology recommendations antihypertensive medications administered for better bp control prior to administration of TNK.  Pt reports she was previously on po metoprolol for bp management, however this was discontinued by her PCP in March 2024 because her bp was controlled at that time.  However, she states she feels like the metoprolol did not give her adequate bp control when she was taking it.  PCCM team contacted for ICU admission post TNK.    MRI Brain/MRA Head: Punctate focus of restricted diffusion in the left parietal lobe, consistent with acute/subacute infarct. Multiple small T2 hyperintense foci in the left parietal lobe, adjacent to the focus of restricted diffusion, suggestive of prior small infarcts. No high-grade flow-limiting stenosis or proximal branch occlusion of the intracranial  circulation.  Pertinent  Medical History  Abnormal Uterine Bleeding  Anxiety Arthritis  Asthma Bipolar Disorder Chronic Headaches  Endometriosis  Hemorrhoids  IBS Terminal Ileitis  Obesity  Psoriatic Arthritis   Significant Hospital Events: Including procedures, antibiotic start and stop dates in addition to other pertinent events   06/13: Pt admitted with hypertensive urgency and acute CVA s/p TKA requiring cleviprex gtt for bp management  12/05/22- patient with no overnight events. She did well and is finishing up her 24h neuromonitoring post tNK infusion  Interim History / Subjective:  Pt complains of 6/10 headache and slurred speech   Objective   Blood pressure (!) 150/100, pulse 85, temperature (!) 97.2 F (36.2 C), temperature source Axillary, resp. rate 17, height 5\' 4"  (1.626 m), weight 101.7 kg, last menstrual period 10/21/2021, SpO2 97 %.        Intake/Output Summary (Last 24 hours) at 12/05/2022 1048 Last data filed at 12/05/2022 0700 Gross per 24 hour  Intake 313.48 ml  Output 1250 ml  Net -936.52 ml   Filed Weights   12/04/22 1341  Weight: 101.7 kg    Examination: General: Acute on chronically-ill appearing female, NAD on RA  HENT: Supple, no JVD  Lungs: Clear throughout, even, non labored  Cardiovascular: NSR, s1s2, no m/r/g, 2+ radial/2+ distal pulses, no edema Abdomen: +BS x4, obese, soft, non tender, non distended  Extremities: Normal bulk and tone Neuro: Aphasic with LUE/LLE drift present, left-sided hemiparesis, alert and oriented, PERRLA  GU: Voiding   Resolved Hospital Problem list     Assessment & Plan:  #Acute/Subacute CVA s/p TNK #Hypertension urgency  - Continuous telemetry monitoring  -  Neuro checks per NIH stroke scale  - Stat CT Head for acute neurological changes - Prn labetalol or cleviprex gtt to maintain sbp 130~150 and dbp <105 - Avoid indwelling catheters or procedures for 24 hrs post TNK  - No antiplatelet medication or  anticoagulation 24 hrs post TNK  - Echo, fasting lipid panel, and hemoglobin A1c pending  - NPO until able to pass bedside swallowing evaluation  - VTE px: SCD's  - PT/OT/Speech  - Repeat CT Head 24 hrs post TNK   #Hypokalemia  - Trend BMP  - Replace electrolytes as indicated  - Monitor UOP   Best Practice (right click and "Reselect all SmartList Selections" daily)   Diet/type: NPO DVT prophylaxis: SCD GI prophylaxis: PPI Lines: N/A Foley:  N/A Code Status:  full code Last date of multidisciplinary goals of care discussion [N/A]  06/13: Pt updated regarding current plan of care and all questions answered  Labs   CBC: Recent Labs  Lab 12/02/22 1723 12/04/22 1515 12/05/22 0522  WBC 7.7 8.0 7.0  NEUTROABS 4.7 5.6 4.4  HGB 14.0 13.5 14.0  HCT 44.0 41.9 44.3  MCV 85.4 84.8 84.9  PLT 260 262 250     Basic Metabolic Panel: Recent Labs  Lab 12/02/22 1723 12/04/22 1515 12/05/22 0522  NA 137 139 137  K 3.6 3.3* 3.6  CL 106 107 107  CO2 22 24 23   GLUCOSE 118* 131* 99  BUN 18 14 13   CREATININE 0.71 0.77 0.60  CALCIUM 9.0 8.9 8.4*  MG  --   --  2.1  PHOS  --   --  3.5    GFR: Estimated Creatinine Clearance: 109.5 mL/min (by C-G formula based on SCr of 0.6 mg/dL). Recent Labs  Lab 12/02/22 1723 12/04/22 1515 12/05/22 0522  WBC 7.7 8.0 7.0     Liver Function Tests: Recent Labs  Lab 12/02/22 1723 12/04/22 1515 12/05/22 0522  AST 23 20 19   ALT 24 20 20   ALKPHOS 96 104 104  BILITOT 0.4 0.3 0.6  PROT 7.0 7.0 7.2  ALBUMIN 3.8 3.6 3.7    Recent Labs  Lab 12/02/22 1723  LIPASE 48    No results for input(s): "AMMONIA" in the last 168 hours.  ABG No results found for: "PHART", "PCO2ART", "PO2ART", "HCO3", "TCO2", "ACIDBASEDEF", "O2SAT"   Coagulation Profile: Recent Labs  Lab 12/04/22 1515  INR 1.0    Cardiac Enzymes: No results for input(s): "CKTOTAL", "CKMB", "CKMBINDEX", "TROPONINI" in the last 168 hours.  HbA1C: Hgb A1c MFr Bld   Date/Time Value Ref Range Status  09/17/2022 08:51 AM 5.1 4.6 - 6.5 % Final    Comment:    Glycemic Control Guidelines for People with Diabetes:Non Diabetic:  <6%Goal of Therapy: <7%Additional Action Suggested:  >8%   07/16/2017 09:14 AM 5.0 4.8 - 5.6 % Final    Comment:             Prediabetes: 5.7 - 6.4          Diabetes: >6.4          Glycemic control for adults with diabetes: <7.0     CBG: Recent Labs  Lab 12/04/22 1337 12/04/22 1559  GLUCAP 136* 128*     Review of Systems: Positives in BOLD   Gen: Denies fever, chills, weight change, fatigue, night sweats HEENT: Denies blurred vision, double vision, hearing loss, tinnitus, sinus congestion, rhinorrhea, sore throat, neck stiffness, dysphagia PULM: Denies shortness of breath, cough, sputum production, hemoptysis, wheezing CV: Denies  chest pain, edema, orthopnea, paroxysmal nocturnal dyspnea, palpitations GI: Denies abdominal pain, nausea, vomiting, diarrhea, hematochezia, melena, constipation, change in bowel habits GU: Denies dysuria, hematuria, polyuria, oliguria, urethral discharge Endocrine: Denies hot or cold intolerance, polyuria, polyphagia or appetite change Derm: Denies rash, dry skin, scaling or peeling skin change Heme: Denies easy bruising, bleeding, bleeding gums Neuro: headache, left facial/arm numbness, weakness, slurred speech, loss of memory or consciousness   Past Medical History:  She,  has a past medical history of Abnormal uterine bleeding (03/04/2022), Anxiety, Arthritis, Asthma, Bipolar disorder (HCC), BRCA negative (07/2017), Chronic headaches, Dysfunctional uterine bleeding (10/07/2021), Endometriosis (2018), Family history of breast cancer, Hemorrhoids (08/2007), Hypertension, IBS (irritable bowel syndrome) (08/2007), Ileitis, terminal (HCC) (08/2007), Increased risk of breast cancer (07/2017), Kidney infection, LGSIL on Pap smear of cervix, Migraines, Obese, Psoriatic arthritis (HCC), and 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   Surgical History:   Past Surgical History:  Procedure Laterality Date   BREAST BIOPSY Left 8 years   CESAREAN SECTION     COLONOSCOPY     COLONOSCOPY W/ BIOPSIES     CYSTOSCOPY N/A 03/04/2022   Procedure: CYSTOSCOPY;  Surgeon: [place] Bing, MD;  Location: [facility] OR;  Service: Gynecology;  Laterality: N/A;   EAR TUBE REMOVAL     ENDOMETRIAL BIOPSY  12/23/2021   ESOPHAGOGASTRODUODENOSCOPY     LAPAROSCOPIC CHOLECYSTECTOMY  6/09   Dr. Lindie Spruce   TOTAL LAPAROSCOPIC HYSTERECTOMY WITH SALPINGECTOMY Bilateral 03/04/2022   Procedure: TOTAL LAPAROSCOPIC HYSTERECTOMY WITH SALPINGECTOMY;  Surgeon: Sebastian Bing, MD;  Location: [facility] OR;  Service: Gynecology;  Laterality: Bilateral;   TUBAL LIGATION  2017   TYMPANOSTOMY TUBE PLACEMENT       Social History:   reports that she has never smoked. She has been exposed to tobacco smoke. She has never used smokeless tobacco. She reports that she does not drink alcohol and does not use drugs.   Family History:  Her family history includes Alzheimer's disease in an other family member; Anxiety disorder in her father; Asthma in her father; Breast cancer (age of onset: 32) in her mother; Colon cancer in an other family member; Colon polyps in her maternal grandfather; Diabetes in an other family member; Irritable bowel syndrome in her maternal grandmother and mother; Parkinson's disease in her maternal grandfather; Rheum arthritis in her mother.   Allergies Allergies  Allergen Reactions   Sulfa Antibiotics Nausea And Vomiting   Cipro [Ciprofloxacin Hcl] Nausea And Vomiting   Effexor [Venlafaxine Hcl] Other (See Comments)    Excessive sleeping, slept for 2 days     Home Medications  Prior to Admission medications   Medication Sig Start Date End Date Taking? Authorizing Provider  albuterol (PROVENTIL HFA;VENTOLIN HFA) 108 (90 Base) MCG/ACT inhaler Inhale 2 puffs into the lungs every 6 (six) hours as needed for wheezing or  shortness of breath. 04/16/16   Karie Schwalbe, MD  ALPRAZolam Prudy Feeler) 0.25 MG tablet Take 1 tablet (0.25 mg total) by mouth 2 (two) times daily as needed for anxiety. 11/10/22   Karie Schwalbe, MD  DULoxetine (CYMBALTA) 30 MG capsule Take 1 capsule (30 mg total) by mouth daily. 09/17/22   Karie Schwalbe, MD  mometasone (ELOCON) 0.1 % ointment APPLY TWICE DAILY AS NEEDED FOR PSORIASIS Patient taking differently: Apply 1 application  topically 2 (two) times daily as needed (psoriasis). 10/15/18   Karie Schwalbe, MD  montelukast (SINGULAIR) 10 MG tablet Take 1 tablet (10 mg total) by mouth at bedtime. 09/17/22   Karie Schwalbe,  MD  traMADol (ULTRAM) 50 MG tablet Take 1 tablet (50 mg total) by mouth 2 (two) times daily as needed. 11/27/22   Eulis Foster, FNP     Critical care provider statement:   Total critical care time: 33 minutes   Performed by: Karna Christmas MD   Critical care time was exclusive of separately billable procedures and treating other patients.   Critical care was necessary to treat or prevent imminent or life-threatening deterioration.   Critical care was time spent personally by me on the following activities: development of treatment plan with patient and/or surrogate as well as nursing, discussions with consultants, evaluation of patient's response to treatment, examination of patient, obtaining history from patient or surrogate, ordering and performing treatments and interventions, ordering and review of laboratory studies, ordering and review of radiographic studies, pulse oximetry and re-evaluation of patient's condition.    Vida Rigger, M.D.  Pulmonary & Critical Care Medicine

## 2022-12-05 NOTE — Progress Notes (Signed)
Subjective: Resting comfortably in bed. Currently with stuttering speech, a new symptom since prior exam by neurology.   Objective: Current vital signs: BP (!) 151/91   Pulse 73   Temp (!) 97.2 F (36.2 C) (Axillary)   Resp 11   Ht 5\' 4"  (1.626 m)   Wt 101.7 kg   LMP 10/21/2021 (Approximate) Comment: Urine preg negatvie 03/04/22  SpO2 100%   BMI 38.50 kg/m  Vital signs in last 24 hours: Temp:  [97.2 F (36.2 C)-98.5 F (36.9 C)] 97.2 F (36.2 C) (06/14 0800) Pulse Rate:  [58-114] 73 (06/14 0800) Resp:  [11-27] 11 (06/14 0800) BP: (102-179)/(63-120) 151/91 (06/14 0800) SpO2:  [94 %-100 %] 100 % (06/14 0800) Weight:  [101.7 kg] 101.7 kg (06/13 1341)  Intake/Output from previous day: 06/13 0701 - 06/14 0700 In: 297.9 [P.O.:240; I.V.:57.9] Out: 1250 [Urine:1250] Intake/Output this shift: No intake/output data recorded. Nutritional status:  Diet Order             Diet Heart Room service appropriate? Yes; Fluid consistency: Thin  Diet effective now                  HEENT: [place]/AT Lungs: Respirations unlabored Ext: No edema  Neurological Examination Mental Status: Awake, alert and oriented. Speech today exhibits new stuttering that waxes and wanes and does not have a quality consistent with a physiological etiology. No errors of grammar or syntax are noted. Comprehension intact; able to follow all commands without difficulty. Cranial Nerves: II: Temporal visual fields intact with no extinction to DSS. PERRL. III,IV, VI: No ptosis. EOMI. No nystagmus. V: Temp sensation equal bilaterally VII: Smile symmetric VIII: Hearing intact to voice IX,X: No hypophonia or hoarseness XI: Symmetric Motor: RUE: 5/5 LUE: 5/5 RLE: 5/5 LLE: 5/5 Sensory: FT intact x 4.  Deep Tendon Reflexes: 2+ and symmetric bilateral biceps, brachioradialis and patellae Plantars: Right: downgoingLeft: downgoing Cerebellar: No ataxia with FNF bilaterally Gait: Deferred  Lab Results: Results  for orders placed or performed during the hospital encounter of 12/04/22 (from the past 48 hour(s))  CBG monitoring, ED     Status: Abnormal   Collection Time: 12/04/22  1:37 PM  Result Value Ref Range   Glucose-Capillary 136 (H) 70 - 99 mg/dL    Comment: Glucose reference range applies only to samples taken after fasting for at least 8 hours.  Protime-INR     Status: None   Collection Time: 12/04/22  3:15 PM  Result Value Ref Range   Prothrombin Time 13.3 11.4 - 15.2 seconds   INR 1.0 0.8 - 1.2    Comment: (NOTE) INR goal varies based on device and disease states. Performed at [facility], [address] [place], [place] [address]   APTT     Status: None   Collection Time: 12/04/22  3:15 PM  Result Value Ref Range   aPTT 26 24 - 36 seconds    Comment: Performed at [facility], [address] [place], [place] [address]  CBC     Status: None   Collection Time: 12/04/22  3:15 PM  Result Value Ref Range   WBC 8.0 4.0 - 10.5 K/uL   RBC 4.94 3.87 - 5.11 MIL/uL   Hemoglobin 13.5 12.0 - 15.0 g/dL   HCT 08.6 57.8 - 46.9 %   MCV 84.8 80.0 - 100.0 fL   MCH 27.3 26.0 - 34.0 pg   MCHC 32.2 30.0 - 36.0 g/dL   RDW 62.9 52.8 - 41.3 %  Platelets 262 150 - 400 K/uL   nRBC 0.0 0.0 - 0.2 %    Comment: Performed at [facility], [address] [place], [place] [address]  Differential     Status: None   Collection Time: 12/04/22  3:15 PM  Result Value Ref Range   Neutrophils Relative % 71 %   Neutro Abs 5.6 1.7 - 7.7 K/uL   Lymphocytes Relative 23 %   Lymphs Abs 1.8 0.7 - 4.0 K/uL   Monocytes Relative 4 %   Monocytes Absolute 0.3 0.1 - 1.0 K/uL   Eosinophils Relative 2 %   Eosinophils Absolute 0.2 0.0 - 0.5 K/uL   Basophils Relative 0 %   Basophils Absolute 0.0 0.0 - 0.1 K/uL   Immature Granulocytes 0 %   Abs Immature Granulocytes 0.02 0.00 - 0.07 K/uL    Comment: Performed at [facility], [address] [place], [place] [address]   Comprehensive metabolic panel     Status: Abnormal   Collection Time: 12/04/22  3:15 PM  Result Value Ref Range   Sodium 139 135 - 145 mmol/L   Potassium 3.3 (L) 3.5 - 5.1 mmol/L   Chloride 107 98 - 111 mmol/L   CO2 24 22 - 32 mmol/L   Glucose, Bld 131 (H) 70 - 99 mg/dL    Comment: Glucose reference range applies only to samples taken after fasting for at least 8 hours.   BUN 14 6 - 20 mg/dL   Creatinine, Ser 9.52 0.44 - 1.00 mg/dL   Calcium 8.9 8.9 - 84.1 mg/dL   Total Protein 7.0 6.5 - 8.1 g/dL   Albumin 3.6 3.5 - 5.0 g/dL   AST 20 15 - 41 U/L   ALT 20 0 - 44 U/L   Alkaline Phosphatase 104 38 - 126 U/L   Total Bilirubin 0.3 0.3 - 1.2 mg/dL   GFR, Estimated >32 >44 mL/min    Comment: (NOTE) Calculated using the CKD-EPI Creatinine Equation (2021)    Anion gap 8 5 - 15    Comment: Performed at [facility], [address], [place], [place] [address]  Ethanol     Status: None   Collection Time: 12/04/22  3:15 PM  Result Value Ref Range   Alcohol, Ethyl (B) <10 <10 mg/dL    Comment: (NOTE) Lowest detectable limit for serum alcohol is 10 mg/dL.  For medical purposes only. Performed at [facility], [address] [place], [place] [address]   HIV Antibody (routine testing w rflx)     Status: None   Collection Time: 12/04/22  3:15 PM  Result Value Ref Range   HIV Screen 4th Generation wRfx Non Reactive Non Reactive    Comment: Performed at [facility], [address], [place], [place] [address]  Glucose, capillary     Status: Abnormal   Collection Time: 12/04/22  3:59 PM  Result Value Ref Range   Glucose-Capillary 128 (H) 70 - 99 mg/dL    Comment: Glucose reference range applies only to samples taken after fasting for at least 8 hours.  MRSA Next Gen by PCR, Nasal     Status: None   Collection Time: 12/04/22  5:15 PM   Specimen: Nasal Mucosa; Nasal Swab  Result Value Ref Range   MRSA by PCR Next Gen NOT DETECTED NOT DETECTED    Comment:  (NOTE) The GeneXpert MRSA Assay (FDA approved for NASAL specimens only), is one component of a comprehensive MRSA colonization surveillance program.  It is not intended to diagnose MRSA infection nor to guide or monitor treatment for MRSA infections. Test performance is not FDA approved in patients less than 71 years old. Performed at [facility], [address] [place], [place] [address]   Lipid panel     Status: Abnormal   Collection Time: 12/05/22  5:22 AM  Result Value Ref Range   Cholesterol 202 (H) 0 - 200 mg/dL   Triglycerides 67 <604 mg/dL   HDL 58 >54 mg/dL   Total CHOL/HDL Ratio 3.5 RATIO   VLDL 13 0 - 40 mg/dL   LDL Cholesterol 098 (H) 0 - 99 mg/dL    Comment:        Total Cholesterol/HDL:CHD Risk Coronary Heart Disease Risk Table                     Men   Women  1/2 Average Risk   3.4   3.3  Average Risk       5.0   4.4  2 X Average Risk   9.6   7.1  3 X Average Risk  23.4   11.0        Use the calculated Patient Ratio above and the CHD Risk Table to determine the patient's CHD Risk.        ATP III CLASSIFICATION (LDL):  <100     mg/dL   Optimal  119-147  mg/dL   Near or Above                    Optimal  130-159  mg/dL   Borderline  829-562  mg/dL   High  >130     mg/dL   Very High Performed at [facility], [address] [place], [place] [address]   CBC with Differential/Platelet     Status: Abnormal   Collection Time: 12/05/22  5:22 AM  Result Value Ref Range   WBC 7.0 4.0 - 10.5 K/uL   RBC 5.22 (H) 3.87 - 5.11 MIL/uL   Hemoglobin 14.0 12.0 - 15.0 g/dL   HCT 46.9 62.9 - 52.8 %   MCV 84.9 80.0 - 100.0 fL   MCH 26.8 26.0 - 34.0 pg   MCHC 31.6 30.0 - 36.0 g/dL   RDW 41.3 24.4 - 01.0 %   Platelets 250 150 - 400 K/uL   nRBC 0.0 0.0 - 0.2 %   Neutrophils Relative % 63 %   Neutro Abs 4.4 1.7 - 7.7 K/uL   Lymphocytes Relative 28 %   Lymphs Abs 2.0 0.7 - 4.0 K/uL   Monocytes Relative 5 %   Monocytes Absolute 0.3 0.1 - 1.0 K/uL    Eosinophils Relative 3 %   Eosinophils Absolute 0.2 0.0 - 0.5 K/uL   Basophils Relative 1 %   Basophils Absolute 0.1 0.0 - 0.1 K/uL   Immature Granulocytes 0 %   Abs Immature Granulocytes 0.02 0.00 - 0.07 K/uL    Comment: Performed at [facility], [address] [place], [place] [address]  Comprehensive metabolic panel     Status: Abnormal   Collection Time: 12/05/22  5:22 AM  Result Value Ref Range   Sodium 137 135 - 145 mmol/L   Potassium 3.6 3.5 - 5.1 mmol/L   Chloride 107 98 - 111 mmol/L   CO2 23 22 - 32 mmol/L   Glucose, Bld 99 70 - 99 mg/dL    Comment: Glucose reference range applies only to samples taken after  fasting for at least 8 hours.   BUN 13 6 - 20 mg/dL   Creatinine, Ser 0.98 0.44 - 1.00 mg/dL   Calcium 8.4 (L) 8.9 - 10.3 mg/dL   Total Protein 7.2 6.5 - 8.1 g/dL   Albumin 3.7 3.5 - 5.0 g/dL   AST 19 15 - 41 U/L   ALT 20 0 - 44 U/L   Alkaline Phosphatase 104 38 - 126 U/L   Total Bilirubin 0.6 0.3 - 1.2 mg/dL   GFR, Estimated >11 >91 mL/min    Comment: (NOTE) Calculated using the CKD-EPI Creatinine Equation (2021)    Anion gap 7 5 - 15    Comment: Performed at [facility], [address], [place], [place] [address]  Magnesium     Status: None   Collection Time: 12/05/22  5:22 AM  Result Value Ref Range   Magnesium 2.1 1.7 - 2.4 mg/dL    Comment: Performed at [facility], [address] [place], [place] [address]  Phosphorus     Status: None   Collection Time: 12/05/22  5:22 AM  Result Value Ref Range   Phosphorus 3.5 2.5 - 4.6 mg/dL    Comment: Performed at [facility], [address] [place], [place] [address]    Recent Results (from the past 240 hour(s))  MRSA Next Gen by PCR, Nasal     Status: None   Collection Time: 12/04/22  5:15 PM   Specimen: Nasal Mucosa; Nasal Swab  Result Value Ref Range Status   MRSA by PCR Next Gen NOT DETECTED NOT DETECTED Final    Comment: (NOTE) The GeneXpert MRSA Assay (FDA  approved for NASAL specimens only), is one component of a comprehensive MRSA colonization surveillance program. It is not intended to diagnose MRSA infection nor to guide or monitor treatment for MRSA infections. Test performance is not FDA approved in patients less than 15 years old. Performed at [facility], [address] [place], [place] [address]     Lipid Panel Recent Labs    12/05/22 0522  CHOL 202*  TRIG 67  HDL 58  CHOLHDL 3.5  VLDL 13  LDLCALC 295*    Studies/Results: CT HEAD WO CONTRAST ( )  Result Date: 12/05/2022 CLINICAL DATA:  Altered mental status, unknown cause EXAM: CT HEAD WITHOUT CONTRAST TECHNIQUE: Contiguous axial images were obtained from the base of the skull through the vertex without intravenous contrast. RADIATION DOSE REDUCTION: This exam was performed according to the departmental dose-optimization program which includes automated exposure control, adjustment of the mA and/or kV according to patient size and/or use of iterative reconstruction technique. COMPARISON:  12/04/2022 CT head and MRI head FINDINGS: Brain: The small focus of restricted diffusion noted on the 12/04/2022 MRI does not have a CT correlate. No evidence of additional acute infarction, hemorrhage, mass, mass effect, or midline shift. No hydrocephalus or extra-axial fluid collection. Vascular: No hyperdense vessel. Skull: Negative for fracture or focal lesion. Sinuses/Orbits: No acute finding. Other: The mastoid air cells are well aerated. IMPRESSION: No acute intracranial process. The small focus of restricted diffusion noted on the 12/04/2022 MRI does not have a CT correlate. Electronically Signed   By: Wiliam Ke M.D.   On: 12/05/2022 02:28   MR BRAIN WO CONTRAST  Result Date: 12/04/2022 CLINICAL DATA:  Neuro deficit, acute, stroke suspected. EXAM: MRI HEAD WITHOUT CONTRAST MRA HEAD WITHOUT CONTRAST TECHNIQUE: Multiplanar, multi-echo pulse sequences of the brain and  surrounding structures were acquired without intravenous contrast. Angiographic  images of the Circle of Willis were acquired using MRA technique without intravenous contrast. COMPARISON:  Head CT December 04, 2022. FINDINGS: MRI HEAD FINDINGS Brain: Punctate focus of restricted diffusion is seen in the left parietal lobe (series 5, image 42). Multiple small T2 hyperintense foci are seen in the left parietal lobe, adjacent to the focus of restricted diffusion, suggestive of prior small infarcts. No definite focus of restricted diffusion in the right hemisphere. No hemorrhage, hydrocephalus, extra-axial collection or mass lesion. Vascular: Normal flow voids. Skull and upper cervical spine: Normal marrow signal. Sinuses/Orbits: No acute or significant finding. Other: None. MRA HEAD FINDINGS Anterior circulation: The visualized portions of the distal cervical and intracranial internal carotid arteries are widely patent with normal flow related enhancement. The bilateral anterior cerebral arteries and middle cerebral arteries are widely patent with anterograde flow without high-grade flow-limiting stenosis or proximal branch occlusion. No intracranial aneurysm within the anterior circulation. Posterior circulation: The vertebral arteries are widely patent with anterograde flow. Vertebrobasilar junction and basilar artery are widely patent with anterograde flow without evidence of basilar stenosis or aneurysm. Posterior cerebral arteries are normal bilaterally. No intracranial aneurysm within the posterior circulation. Anatomic variants: None significant. IMPRESSION: 1. Punctate focus of restricted diffusion in the left parietal lobe, consistent with acute/subacute infarct. 2. Multiple small T2 hyperintense foci in the left parietal lobe, adjacent to the focus of restricted diffusion, suggestive of prior small infarcts. 3. No high-grade flow-limiting stenosis or proximal branch occlusion of the intracranial circulation.  Electronically Signed   By: Baldemar Lenis M.D.   On: 12/04/2022 15:03   MR ANGIO HEAD WO CONTRAST  Result Date: 12/04/2022 CLINICAL DATA:  Neuro deficit, acute, stroke suspected. EXAM: MRI HEAD WITHOUT CONTRAST MRA HEAD WITHOUT CONTRAST TECHNIQUE: Multiplanar, multi-echo pulse sequences of the brain and surrounding structures were acquired without intravenous contrast. Angiographic images of the Circle of Willis were acquired using MRA technique without intravenous contrast. COMPARISON:  Head CT December 04, 2022. FINDINGS: MRI HEAD FINDINGS Brain: Punctate focus of restricted diffusion is seen in the left parietal lobe (series 5, image 42). Multiple small T2 hyperintense foci are seen in the left parietal lobe, adjacent to the focus of restricted diffusion, suggestive of prior small infarcts. No definite focus of restricted diffusion in the right hemisphere. No hemorrhage, hydrocephalus, extra-axial collection or mass lesion. Vascular: Normal flow voids. Skull and upper cervical spine: Normal marrow signal. Sinuses/Orbits: No acute or significant finding. Other: None. MRA HEAD FINDINGS Anterior circulation: The visualized portions of the distal cervical and intracranial internal carotid arteries are widely patent with normal flow related enhancement. The bilateral anterior cerebral arteries and middle cerebral arteries are widely patent with anterograde flow without high-grade flow-limiting stenosis or proximal branch occlusion. No intracranial aneurysm within the anterior circulation. Posterior circulation: The vertebral arteries are widely patent with anterograde flow. Vertebrobasilar junction and basilar artery are widely patent with anterograde flow without evidence of basilar stenosis or aneurysm. Posterior cerebral arteries are normal bilaterally. No intracranial aneurysm within the posterior circulation. Anatomic variants: None significant. IMPRESSION: 1. Punctate focus of restricted  diffusion in the left parietal lobe, consistent with acute/subacute infarct. 2. Multiple small T2 hyperintense foci in the left parietal lobe, adjacent to the focus of restricted diffusion, suggestive of prior small infarcts. 3. No high-grade flow-limiting stenosis or proximal branch occlusion of the intracranial circulation. Electronically Signed   By: Baldemar Lenis M.D.   On: 12/04/2022 15:03   CT HEAD CODE STROKE WO  CONTRAST  Result Date: 12/04/2022 CLINICAL DATA:  Code stroke. Neuro deficit, acute, stroke suspected. Left-sided numbness and slurred speech. EXAM: CT HEAD WITHOUT CONTRAST TECHNIQUE: Contiguous axial images were obtained from the base of the skull through the vertex without intravenous contrast. RADIATION DOSE REDUCTION: This exam was performed according to the departmental dose-optimization program which includes automated exposure control, adjustment of the mA and/or kV according to patient size and/or use of iterative reconstruction technique. COMPARISON:  None Available. FINDINGS: Brain: No acute intracranial hemorrhage. Gray-white differentiation is preserved. No hydrocephalus or extra-axial collection. No mass effect or midline shift. Vascular: No hyperdense vessel or unexpected calcification. Prominent arachnoid granulation in the right transverse sinus. Skull: No calvarial fracture or suspicious bone lesion. Skull base is unremarkable. Sinuses/Orbits: Unremarkable. Other: None. ASPECTS ([place]berta Stroke Program Early CT Score) - Ganglionic level infarction (caudate, lentiform nuclei, internal capsule, insula, M1-M3 cortex): 7 - Supraganglionic infarction (M4-M6 cortex): 3 Total score (0-10 with 10 being normal): 10 IMPRESSION: No acute intracranial hemorrhage or evidence of evolving large vessel territory infarct. ASPECT score is 10. Code stroke imaging results were communicated on 12/04/2022 at 1:50 pm to provider Dr. Otelia Limes via secure text paging. Electronically Signed    By: Orvan Falconer M.D.   On: 12/04/2022 13:52    Medications: Scheduled:  Chlorhexidine Gluconate Cloth  6 each Topical Daily   labetalol  20 mg Intravenous Once   pantoprazole (PROTONIX) IV  40 mg Intravenous QHS   sodium chloride flush  3 mL Intravenous Once   Continuous:  clevidipine 4 mg/hr (12/05/22 0000)    Assessment: 42 year old female presenting with acute onset of left sided weakness and numbness with left facial droop. Initial exam revealed left sided deficits with NIHSS of 4. There was some question of functionality on exam, requiring MRI brain for further assessment. 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 TNK was administered and the patient was admitted for stroke work up.  - Exam today reveals no objective neurological deficit. Non-physiological waxing and waning stuttering is noted during assessment of speech, without impaired comprehension and with no errors of syntax or grammar.  - Imaging studies: - MRI brain: Punctate focus of restricted diffusion in the left parietal lobe, consistent with acute/subacute infarct. Multiple small T2 hyperintense foci in the left parietal lobe, adjacent to the focus of restricted diffusion, suggestive of prior small infarcts. - MRA head: No high-grade flow-limiting stenosis or proximal branch occlusion of the intracranial circulation.  - CT head: No acute intracranial hemorrhage or evidence of evolving large vessel territory infarct. ASPECT score is 10. - TTE completed with report pending - Labs: HgbA1c in March was normal. Elevated total cholesterol of 202. LDL elevated at 131. AST and ALT are normal.  - New history obtained from the patient today. She states that she had been on an ARB previously, but that it had been discontinued by her PCP due to it apparently no longer being indicated. About 2 weeks ago, she started taking  again what ARB tablets she had left, as directed on the bottle, and had been planning to visit her PCP again to have the rx refilled. She is not currently on any prescribed antihypertensives outpatient and she has not been started on scheduled-dose antihypertensive inpatient. SBP noted to be high during assessment today.    Recommendations: - Hypercoagulable panel (ordered).  - CTA neck (ordered) - SBP goal of 130-150  -  Obtain repeat CT head at 1500. Start ASA 81 mg po qd if follow up CT at 24 hours is negative for hemorrhagic conversion. - DVT prophylaxis with SCDs.  - Will need to be started on a statin. Obtaining a baseline CK level.  - PT/OT/Speech.  - Telemetry monitoring - Risk factor modification including weight loss and light exercise - Starting lisinopril 5 mg po today (ordered). If BP still high tomorrow, consider increasing the dose or adding another antihypertensive to her regimen.    LOS: 1 day   @Electronically  signed: Dr. Caryl Pina 12/05/2022  9:12 AM

## 2022-12-05 NOTE — Progress Notes (Signed)
PT Cancellation Note  Patient Details Name: Bridget Maldonado MRN: [id number] DOB: April 25, 1981   Cancelled Treatment:    Reason Eval/Treat Not Completed: Patient not medically ready Consult received, chart reviewed. TNK given at 1501 on 6/13 and currently on bed rest x 24 hours. Will re-attempt at later date/time as appropriate.   [name], PT, DPT Physical Therapist - [place]  [facility] [place]  [name] 12/05/2022, 9:26 AM

## 2022-12-05 NOTE — Progress Notes (Signed)
OT Cancellation Note  Patient Details Name: Bridget Maldonado MRN: [id number] DOB: [birth year]   Cancelled Treatment:    Reason Eval/Treat Not Completed: Medical issues which prohibited therapy. Consult received and chart reviewed.  Patient admitted to ICU for CVA work up with TNK infusion (15:00, 6/13).  Per guidelines, to be on strict bedrest x24 hours post infusion and follow up imaging required to be completed prior to initiation of therapy.  Will continue to follow and initiate as medically appropriate.   Thresa Ross, OTS

## 2022-12-06 ENCOUNTER — Inpatient Hospital Stay: Payer: No Typology Code available for payment source

## 2022-12-06 DIAGNOSIS — I639 Cerebral infarction, unspecified: Secondary | ICD-10-CM | POA: Diagnosis not present

## 2022-12-06 DIAGNOSIS — E876 Hypokalemia: Secondary | ICD-10-CM

## 2022-12-06 DIAGNOSIS — E669 Obesity, unspecified: Secondary | ICD-10-CM

## 2022-12-06 DIAGNOSIS — I10 Essential (primary) hypertension: Secondary | ICD-10-CM

## 2022-12-06 LAB — LUPUS ANTICOAGULANT PANEL
DRVVT: 30 s (ref 0.0–47.0)
PTT Lupus Anticoagulant: 26.2 s (ref 0.0–43.5)

## 2022-12-06 LAB — BETA-2-GLYCOPROTEIN I ABS, IGG/M/A
Beta-2 Glyco I IgG: <9 GPI IgG units (ref 0–20)
Beta-2-Glycoprotein I IgA: <9 GPI IgA units (ref 0–25)
Beta-2-Glycoprotein I IgM: <9 GPI IgM units (ref 0–32)

## 2022-12-06 LAB — CBC
HCT: 41.2 % (ref 36.0–46.0)
Hemoglobin: 13.3 g/dL (ref 12.0–15.0)
MCH: 27.4 pg (ref 26.0–34.0)
MCHC: 32.3 g/dL (ref 30.0–36.0)
MCV: 84.8 fL (ref 80.0–100.0)
Platelets: 237 10*3/uL (ref 150–400)
RBC: 4.86 MIL/uL (ref 3.87–5.11)
RDW: 14.6 % (ref 11.5–15.5)
WBC: 6.7 10*3/uL (ref 4.0–10.5)
nRBC: 0 % (ref 0.0–0.2)

## 2022-12-06 LAB — CARDIOLIPIN ANTIBODIES, IGG, IGM, IGA
Anticardiolipin IgA: <9 APL U/mL (ref 0–11)
Anticardiolipin IgG: <9 GPL U/mL (ref 0–14)
Anticardiolipin IgM: <9 MPL U/mL (ref 0–12)

## 2022-12-06 LAB — RENAL FUNCTION PANEL
Albumin: 3.4 g/dL — ABNORMAL LOW (ref 3.5–5.0)
Anion gap: 6 (ref 5–15)
BUN: 23 mg/dL — ABNORMAL HIGH (ref 6–20)
CO2: 23 mmol/L (ref 22–32)
Calcium: 8.5 mg/dL — ABNORMAL LOW (ref 8.9–10.3)
Chloride: 107 mmol/L (ref 98–111)
Creatinine, Ser: 0.66 mg/dL (ref 0.44–1.00)
GFR, Estimated: >60 mL/min (ref >60)
Glucose, Bld: 107 mg/dL — ABNORMAL HIGH (ref 70–99)
Phosphorus: 3.3 mg/dL (ref 2.5–4.6)
Potassium: 3.2 mmol/L — ABNORMAL LOW (ref 3.5–5.1)
Sodium: 136 mmol/L (ref 135–145)

## 2022-12-06 LAB — HOMOCYSTEINE: Homocysteine: 10.1 umol/L (ref 0.0–14.5)

## 2022-12-06 LAB — PROTEIN C ACTIVITY: Protein C Activity: 153 % (ref 73–180)

## 2022-12-06 LAB — PROTEIN S ACTIVITY: Protein S Activity: 77 % (ref 63–140)

## 2022-12-06 LAB — PROTEIN S, TOTAL: Protein S Ag, Total: 101 % (ref 60–150)

## 2022-12-06 MED ORDER — POTASSIUM CHLORIDE 20 MEQ PO PACK
40.0000 meq | PACK | Freq: Once | ORAL | Status: AC
  Administered 2022-12-06: 40 meq via ORAL
  Filled 2022-12-06: qty 2

## 2022-12-06 MED ORDER — ATORVASTATIN CALCIUM 20 MG PO TABS
20.0000 mg | ORAL_TABLET | Freq: Every day | ORAL | Status: DC
  Administered 2022-12-06 – 2022-12-09 (×4): 20 mg via ORAL
  Filled 2022-12-06 (×4): qty 1

## 2022-12-06 MED ORDER — GADOBUTROL 1 MMOL/ML IV SOLN
10.0000 mL | Freq: Once | INTRAVENOUS | Status: AC | PRN
  Administered 2022-12-06: 10 mL via INTRAVENOUS

## 2022-12-06 MED ORDER — IPRATROPIUM-ALBUTEROL 0.5-2.5 (3) MG/3ML IN SOLN: 3.0000 mL | Freq: Four times a day (QID) | RESPIRATORY_TRACT | Status: DC | PRN

## 2022-12-06 MED ORDER — HYDRALAZINE HCL 20 MG/ML IJ SOLN: 5.0000 mg | Freq: Four times a day (QID) | INTRAMUSCULAR | Status: DC | PRN

## 2022-12-06 MED ORDER — PANTOPRAZOLE SODIUM 40 MG PO TBEC
40.0000 mg | DELAYED_RELEASE_TABLET | Freq: Every day | ORAL | Status: DC
  Administered 2022-12-06 – 2022-12-08 (×3): 40 mg via ORAL
  Filled 2022-12-06 (×3): qty 1

## 2022-12-06 NOTE — TOC Progression Note (Signed)
Transition of Care [facility]) - Progression Note    Patient Details  Name: Bridget Maldonado MRN: [id number] Date of Birth: [birth year]  Transition of [facility] CM/SW Contact  Allena Katz, LCSW Phone Number: 12/06/2022, 10:34 AM  Clinical Narrative:     Pt admitted with Stroke, pt currently insured and has current PCP. PT was ordered for patient but pt is on bed rest currently. CSW will reassess for patient discharge needs once PT has evaluated.       Expected Discharge Plan and Services                                               Social Determinants of Health (SDOH) Interventions SDOH Screenings   Depression (PHQ2-9): Medium Risk (09/17/2022)  Tobacco Use: Low Risk  (12/02/2022)    Readmission Risk Interventions     No data to display

## 2022-12-06 NOTE — Evaluation (Addendum)
Physical Therapy Evaluation Patient Details Name: Bridget Maldonado MRN: [id number] DOB: 01/20/1981 Today's Date: 12/06/2022  History of Present Illness  Per chart, 42 year old female presenting with acute onset of left sided weakness and numbness with left facial droop. Initial exam revealed left sided deficits with NIHSS of 4. There was some question of functionality on exam, requiring MRI brain for further assessment. 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 TNK was administered and the patient was admitted for stroke work up.  Clinical Impression  Pt is upright in bed with family present, pt is A&Ox4. Pt denies any pain throughout session. PTA pt was independent with mobility and ADL's, lives with 75 y/o daughter. Pt performed bed mobility, transfers, and ambulation c/ CGA and RW, needs extra time to complete transfers. Pt reports non-dermatomal pattern of diminished sensation and decreased strength on LUE and LLE in comparison to RUE and RLE. Pt demonstrates mild coordination deficits in LLE and LUE. Most noticeable with ambulation; L foot drag, decreased step length/height bilaterally, and decreased gait velocity noted. Pt left in bed c/ family present and staff getting pt to go to MRI. Pt would benefit from acute therapy needs to maximize return to PLOF and independence.      Recommendations for follow up therapy are one component of a multi-disciplinary discharge planning process, led by the attending physician.  Recommendations may be updated based on patient status, additional functional criteria and insurance authorization.  Follow Up Recommendations       Assistance Recommended at Discharge Frequent or constant Supervision/Assistance  Patient can return home with the following  A little help with walking and/or transfers;A lot of help with bathing/dressing/bathroom;Assistance  with cooking/housework;Assist for transportation;Help with stairs or ramp for entrance;Direct supervision/assist for medications management    Equipment Recommendations Other (comment) (TBD at next venue of care)  Recommendations for Other Services       Functional Status Assessment Patient has had a recent decline in their functional status and demonstrates the ability to make significant improvements in function in a reasonable and predictable amount of time.     Precautions / Restrictions Precautions Precautions: Fall Restrictions Weight Bearing Restrictions: No      Mobility  Bed Mobility Overal bed mobility: Needs Assistance Bed Mobility: Supine to Sit     Supine to sit: Min guard, HOB elevated          Transfers Overall transfer level: Needs assistance Equipment used: Rolling walker (2 wheels) Transfers: Sit to/from Stand Sit to Stand: Min guard           General transfer comment: extra time for completing transfers, one hand on bed, one on RW    Ambulation/Gait Ambulation/Gait assistance: Min guard Gait Distance (Feet): 200 Feet Assistive device: Rolling walker (2 wheels) Gait Pattern/deviations: Decreased stride length, Step-through pattern, Drifts right/left, Trunk flexed, Decreased dorsiflexion - left, Decreased dorsiflexion - right          Stairs            Wheelchair Mobility    Modified Rankin (Stroke Patients Only)       Balance Overall balance assessment: Needs assistance Sitting-balance support: Feet supported, No upper extremity supported Sitting balance-Leahy Scale: Fair     Standing balance support: Bilateral upper extremity supported, Reliant on assistive device for balance Standing balance-Leahy Scale: Poor  Pertinent Vitals/Pain Pain Assessment Pain Assessment: No/denies pain    Home Living Family/patient expects to be discharged to:: Private residence Living  Arrangements: Children Available Help at Discharge: Family Type of Home: House Home Access: Stairs to enter Entrance Stairs-Rails: Can reach both Entrance Stairs-Number of Steps: 4-5   Home Layout: One level Home Equipment: None      Prior Function Prior Level of Function : Independent/Modified Independent;Driving                     Hand Dominance   Dominant Hand: Right    Extremity/Trunk Assessment   Upper Extremity Assessment Upper Extremity Assessment: LUE deficits/detail LUE Deficits / Details: mild ataxia in LUE when doing coordination testing, 3-/5 LUE throughout, compared to 4/5 RUE throughout. LUE Sensation: decreased light touch LUE Coordination: decreased gross motor    Lower Extremity Assessment Lower Extremity Assessment: LLE deficits/detail LLE Deficits / Details: ataxic gait, foot drop L>R, LLE 3-/5 throughout compared to RLE 4/5 LLE Sensation: decreased light touch LLE Coordination: decreased gross motor    Cervical / Trunk Assessment Cervical / Trunk Assessment: Normal  Communication   Communication: Expressive difficulties  Cognition Arousal/Alertness: Awake/alert Behavior During Therapy: WFL for tasks assessed/performed Overall Cognitive Status: Within Functional Limits for tasks assessed                                          General Comments      Exercises     Assessment/Plan    PT Assessment Patient needs continued PT services  PT Problem List Decreased strength;Decreased range of motion;Decreased knowledge of use of DME;Decreased activity tolerance;Decreased safety awareness;Decreased balance;Decreased mobility;Decreased coordination;Impaired sensation       PT Treatment Interventions DME instruction;Gait training;Neuromuscular re-education;Balance training;Stair training;Functional mobility training;Patient/family education;Therapeutic activities;Therapeutic exercise    PT Goals (Current goals can be found  in the Care Plan section)  Acute Rehab PT Goals Patient Stated Goal: to return to home PT Goal Formulation: With patient Time For Goal Achievement: 12/20/22 Potential to Achieve Goals: Good    Frequency Min 4X/week     Co-evaluation               AM-PAC PT "6 Clicks" Mobility  Outcome Measure Help needed turning from your back to your side while in a flat bed without using bedrails?: A Little Help needed moving from lying on your back to sitting on the side of a flat bed without using bedrails?: A Little Help needed moving to and from a bed to a chair (including a wheelchair)?: A Little Help needed standing up from a chair using your arms (e.g., wheelchair or bedside chair)?: A Little Help needed to walk in hospital room?: A Little Help needed climbing 3-5 steps with a railing? : A Lot 6 Click Score: 17    End of Session Equipment Utilized During Treatment: Gait belt Activity Tolerance: Patient tolerated treatment well Patient left: with family/visitor present;in bed;Other (comment) (staff to transport pt to MRI) Nurse Communication: Mobility status PT Visit Diagnosis: Ataxic gait (R26.0);Unsteadiness on feet (R26.81);Hemiplegia and hemiparesis Hemiplegia - Right/Left: Left Hemiplegia - dominant/non-dominant: Non-dominant Hemiplegia - caused by: Cerebral infarction    Time: 1610-9604 PT Time Calculation (min) (ACUTE ONLY): 30 min   Charges:   PT Evaluation $PT Eval Low Complexity: 1 Low PT Treatments $Therapeutic Activity: 23-37 mins        De Nurse  Derrell Lolling, PT, SPT  3:49 PM,12/06/22

## 2022-12-06 NOTE — Evaluation (Signed)
Occupational Therapy Evaluation Patient Details Name: Bridget Maldonado MRN: [id number] DOB: Apr 25, 1981 Today's Date: 12/06/2022   History of Present Illness Per chart, 42 year old female presenting with acute onset of left sided weakness and numbness with left facial droop. Initial exam revealed left sided deficits with NIHSS of 4. There was some question of functionality on exam, requiring MRI brain for further assessment. 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 TNK was administered and the patient was admitted for stroke work up.   Clinical Impression   Pt seen this date for OT evaluation.  Multiple family members present in room at time of eval, all very supportive.  Pt presents with LUE weakness, moderate-severe ataxia, mild apraxia throughout the LUE, and some report of numbness in the L arm.  Pt required RW and min guard for functional transfers and mobility d/t ataxic gait.  Pt tolerated standing at the sink for various grooming tasks, including washing hands, brushing teeth and hair, and applying dry shampoo, all with min guard.  Able to manage opening toothpaste cap and squeezing on to brush.  At baseline, pt was indep, including working full time and caring for her 11 year old daughter.  Mother and father both work, but report they would be able to manage 24/7 care if needed, though uncertain how long, and anticipate this would be an effort d/t both parents working.  Pt would benefit from inpatient rehab consult.  Will continue to follow in the acute setting to maximize indep and safety with ADLs and functional mobility.       Recommendations for follow up therapy are one component of a multi-disciplinary discharge planning process, led by the attending physician.  Recommendations may be updated based on patient status, additional functional criteria and insurance authorization.    Assistance Recommended at Discharge Frequent or constant Supervision/Assistance  Patient can return home with the following Assistance with cooking/housework;A little help with walking and/or transfers;A little help with bathing/dressing/bathroom;Assist for transportation;Help with stairs or ramp for entrance    Functional Status Assessment  Patient has had a recent decline in their functional status and demonstrates the ability to make significant improvements in function in a reasonable and predictable amount of time.  Equipment Recommendations   (defer to next venue)    Recommendations for Other Services Rehab consult     Precautions / Restrictions Precautions Precautions: Fall Restrictions Weight Bearing Restrictions: No      Mobility Bed Mobility               General bed mobility comments: NT as pt was received and left in chair Patient Response: Cooperative  Transfers Overall transfer level: Needs assistance Equipment used: Rolling walker (2 wheels) Transfers: Sit to/from Stand Sit to Stand: Min guard           General transfer comment: extra time and effort; BUE support on arm rests of chair for sit to stand      Balance Overall balance assessment: Needs assistance Sitting-balance support: Feet supported, Single extremity supported Sitting balance-Leahy Scale: Fair     Standing balance support: During functional activity, Reliant on assistive device for balance Standing balance-Leahy Scale: Poor Standing balance comment: Pt could briefly remove hands from walker during grooming tasks at sink but required min guard and demonstrated some postural ataxia  ADL either performed or assessed with clinical judgement   ADL Overall ADL's : Needs assistance/impaired     Grooming: Wash/dry face;Oral care;Brushing hair;Min guard;Standing Grooming Details (indicate cue type and reason): able to use dry shampoo and put hair  in a more L sided ponytail while standing with min guard             Lower Body Dressing: Minimal assistance   Toilet Transfer: Min guard;Grab bars;Regular Toilet           Functional mobility during ADLs: Minimal assistance;Rolling walker (2 wheels) General ADL Comments: Family had been assisting with hand held assist to help pt reach toilet, but pt did better with walker, requiring min guard and intermittent vc for posture.     Vision   Vision Assessment?: Yes Ocular Range of Motion: Within Functional Limits Alignment/Gaze Preference: Within Defined Limits Tracking/Visual Pursuits: Other (comment) (decreased smoothness of tracking in all quadrants, delayed initiation) Saccades: Decreased speed of saccadic movement Visual Fields: No apparent deficits     Perception     Praxis      Pertinent Vitals/Pain Pain Assessment Pain Assessment: 0-10 Pain Score: 1  Pain Location: L side of head, mild Pain Descriptors / Indicators: Aching Pain Intervention(s): Monitored during session     Hand Dominance Right   Extremity/Trunk Assessment Upper Extremity Assessment Upper Extremity Assessment: LUE deficits/detail LUE Deficits / Details: moderate-severe ataxia throughout the LUE, moderate apraxia.  Grossly 3-/5 L shoulder, 3+/5 L elbow and wrist as compared to 5/5 RUE throughout LUE Sensation:  (pt reports numbness) LUE Coordination: decreased fine motor;decreased gross motor (able to oppose each digit to thumb with extra time)   Lower Extremity Assessment Lower Extremity Assessment: LLE deficits/detail;Defer to PT evaluation LLE Deficits / Details: ataxic gait   Cervical / Trunk Assessment Cervical / Trunk Assessment: Normal   Communication Communication Communication: Expressive difficulties   Cognition Arousal/Alertness: Awake/alert Behavior During Therapy: WFL for tasks assessed/performed Overall Cognitive Status: Within Functional Limits for tasks assessed                                        General Comments       Exercises Other Exercises Other Exercises: Educ on OT role, goals, poc   Shoulder Instructions      Home Living Family/patient expects to be discharged to:: Private residence Living Arrangements: Children Available Help at Discharge: Family Type of Home: House Home Access: Stairs to enter Secretary/administrator of Steps: 4-5 Entrance Stairs-Rails: Can reach both Home Layout: One level     Bathroom Shower/Tub: Chief Strategy Officer: Standard     Home Equipment: None      Lives With: Daughter (18 year old daughter)    Prior Functioning/Environment Prior Level of Function : Independent/Modified Independent;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)                        OT Problem List: Decreased strength;Impaired vision/perception;Decreased knowledge of use of DME or AE;Decreased range of motion;Decreased coordination;Decreased activity tolerance;Impaired UE functional use;Impaired balance (sitting and/or standing);Decreased safety awareness;Impaired sensation      OT Treatment/Interventions: Self-care/ADL training;Visual/perceptual remediation/compensation;Therapeutic exercise;Patient/family education;Neuromuscular education;Balance training;Therapeutic activities;DME and/or AE instruction    OT Goals(Current goals can be found in the care plan section) Acute Rehab OT Goals Patient Stated Goal: To go home with family support OT Goal  Formulation: With patient/family Time For Goal Achievement: 12/20/22 Potential to Achieve Goals: Good  OT Frequency: Min 3X/week    Co-evaluation              AM-PAC OT "6 Clicks" Daily Activity     Outcome Measure Help from another person eating meals?: None Help from another person taking care of personal grooming?: A Little Help from another person toileting, which includes using toliet, bedpan, or urinal?: A Little Help from another person  bathing (including washing, rinsing, drying)?: A Lot Help from another person to put on and taking off regular upper body clothing?: A Little Help from another person to put on and taking off regular lower body clothing?: A Lot 6 Click Score: 17   End of Session Equipment Utilized During Treatment: Gait belt;Rolling walker (2 wheels)  Activity Tolerance: Patient tolerated treatment well Patient left: in chair;with call bell/phone within reach;with family/visitor present  OT Visit Diagnosis: Other abnormalities of gait and mobility (R26.89);Muscle weakness (generalized) (M62.81);Ataxia, unspecified (R27.0)                Time: 1610-9604 OT Time Calculation (min): 33 min Charges:  OT General Charges $OT Visit: 1 Visit OT Evaluation $OT Eval High Complexity: 1 High OT Treatments $Self Care/Home Management : 8-22 mins  Danelle Earthly, MS, OTR/L   Otis Dials 12/06/2022, 1:25 PM

## 2022-12-06 NOTE — Progress Notes (Signed)
  Progress Note   Patient: Bridget Maldonado [facility] ZOX:[id number] DOB: [birth year] DOA: 12/04/2022     2 DOS: the patient was seen and examined on 12/06/2022   Brief hospital course: Bridget Maldonado is a 42 yo female with past medical history significant for obesity, anxiety, bipolar disorder, chronic headaches, asthma presented to [facility] ER on 06/13 with left sided arm and facial numbness along with slurred speech. Prior to symptoms pt reports she had a 7/10 bifrontal nonthrobbing headache without visual changes or nausea. Her last known well time was 1300 on 06/13.  Initial CT head unremarkable, MRI brain revealed punctate focus of restricted diffusion in the left parietal lobe consistent with acute/subacute infarct.  TNK given after blood pressure control.  Patient is admitted to ICU post streptokinase, CTA head and neck no high-grade flow limiting stenosis.  Neurology team involved advised full stroke workup.  Assessment and Plan: Acute/ subacute Left parietal lobe S/p TNK administration. Out of ICU yesterday. Continue neurochecks per floor protocol. Her speech more worse, ataxia noted. MRI brain ordered per neurology team. Echo unremarkable. Continue telemetry. A1c 4.9.  Blood sugar stable. LDL 131, started atorvastatin 20 mg daily. Hypercoagulable work up pending. Speech and swallow evaluation and follow up. PT/ OT evaluation and follow up. Will get stat CT head for any deterioration.  Hypertension- BP better. Continue Lisinopril. IV Hydralazine for SBP> 160 ordered. Cardiac diet.  Hypokalemia- Oral replacements ordered. Monitor daily electrolytes.  Fall, aspiration precautions. DVT prophylaxis- Lovenox. Nursing supportive care. CODE STATUS- FULL CODE.       Subjective: Patient is seen and examined today morning.  She is sitting on the bed trouble speaking, unable to understand.  Speech difficulty or send as per mother at bedside.  Left sided weakness noted.  Physical  Exam: Vitals:   12/05/22 2336 12/06/22 0433 12/06/22 0744 12/06/22 1152  BP: 129/70 113/75 (!) 139/107 (!) 150/98  Pulse: 75 81 79 81  Resp: 18 18 15 16   Temp: 97.9 F (36.6 C) 97.8 F (36.6 C) 98.1 F (36.7 C) 98.4 F (36.9 C)  TempSrc: Oral Oral Oral Oral  SpO2: 96% 95% 97% 99%  Weight:      Height:       General -and obese Caucasian female, no apparent distress HEENT - PERRLA, EOMI, atraumatic head, non tender sinuses. Lung - Clear, rales, rhonchi, wheezes. Heart - S1, S2 heard, no murmurs, rubs, trace pedal edema Neuro - Alert, awake and oriented x 3, slurred speech, left-sided weakness Skin - Warm and dry. Data Reviewed:  BMP potassium 3.2, CBC unremarkable  Family Communication: Patient's mother, father at bedside.  They understand and agree.  All questions answered.  Disposition: Status is: Inpatient Remains inpatient appropriate because: Pending stroke workup  Planned Discharge Destination: Home with Home Health    Time spent: 45 minutes  Author: Marcelino Duster, MD 12/06/2022 3:56 PM  For on call review [email or web address].

## 2022-12-06 NOTE — Evaluation (Signed)
Speech Language Pathology Evaluation Patient Details Name: Bridget Maldonado MRN: [id number] DOB: 01-18-1981 Today's Date: 12/06/2022 Time: 4098-1191 SLP Time Calculation (min) (ACUTE ONLY): 21 min  Problem List:  Patient Active Problem List   Diagnosis Date Noted   Acute CVA (cerebrovascular accident) (HCC) 12/04/2022   Prediabetes 09/17/2022   Preventative health care 06/13/2020   Bipolar disease, chronic (HCC) 07/27/2018   Family history of colon cancer 07/27/2017   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   Family history of breast cancer 07/16/2017   Psoriatic arthritis (HCC) 10/28/2016   Chronic low back pain 10/28/2016   Essential (primary) hypertension 10/18/2014   Arthropathia 11/24/2013   Obstipation 04/12/2013   Migraine with status migrainosus 09/08/2012   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   Mild persistent asthma 05/26/2011   Severe anxiety with panic 08/29/2006   Panic disorder without agoraphobia 08/29/2006   Past Medical History:  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   Increased risk of breast cancer 07/2017   IBIS=40%   Kidney infection    Pylonephritis when pregnant   LGSIL on Pap smear of cervix    Migraines    Obese    Psoriatic arthritis (HCC)    Secondary oligomenorrhea 07/16/2017   Past Surgical History:  Past Surgical History:  Procedure Laterality Date   BREAST BIOPSY Left 8 years   CESAREAN SECTION     COLONOSCOPY     COLONOSCOPY W/ BIOPSIES     CYSTOSCOPY N/A 03/04/2022   Procedure: CYSTOSCOPY;  Surgeon: [place] Bing, MD;  Location: [facility] OR;  Service: Gynecology;  Laterality: N/A;   EAR TUBE REMOVAL     ENDOMETRIAL BIOPSY  12/23/2021   ESOPHAGOGASTRODUODENOSCOPY     LAPAROSCOPIC CHOLECYSTECTOMY  6/09   Dr. Lindie Spruce   TOTAL LAPAROSCOPIC HYSTERECTOMY WITH SALPINGECTOMY Bilateral 03/04/2022   Procedure: TOTAL LAPAROSCOPIC HYSTERECTOMY WITH SALPINGECTOMY;  Surgeon: [place] Bing, MD;  Location: [facility] OR;  Service: Gynecology;  Laterality: Bilateral;   TUBAL LIGATION  2017   TYMPANOSTOMY TUBE PLACEMENT     HPI:  42 year old female presenting with acute onset of left sided weakness and numbness with left facial droop. Initial exam revealed left sided deficits with NIHSS of 4. There was some question of functionality on exam, requiring MRI brain for further assessment. 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 TNK was administered and the patient was admitted for stroke work up.     MRI (12/04/2022) Punctate focus of restricted diffusion in the left parietal lobe, consistent with acute/subacute infarct. 2. Multiple small T2 hyperintense foci in the left parietal lobe, adjacent to the focus of restricted diffusion, suggestive of prior small infarcts. 3. No high-grade flow-limiting stenosis or proximal branch occlusion  of the intracranial circulation.    Head CT (12/05/2022): No acute findings are seen in noncontrast CT brain. No significant interval changes are noted in comparison with the immediate previous. CT brain done earlier today.   Assessment /  Plan / Recommendation Clinical Impression  Pt's parents present during this evaluation. Overall they report concern for progressive impairment in speech. Pt was initally in the bathroom with this writer observing ataxic gail patterns when ambulating out of bathroom with nursing assist. 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 Her speech is c/b prolonged intervals of movement, sudden forced appearing inspiration/expiration, voice stoppages/arrests, voice tremor, transient breathy strained vocal quality, myoclonic vowel prolongation (e.g., tremor-like "beats" in the voice), inappropriate vocal noises, intermittent breathy or aphonic segments, distorted vowels, voice stoppages/arrests, involuntary head, jaw, face, tongue, velar, laryngeal, and respiratory movements, relatively sustained deviation of head position, lingual deviation to the right and facial grimacing during speech. As a result, pt's speech intelligibility is < 25% at the word level. Pt is able to demosntrate legible writing but her letters are large with observed discoordination of hand movements while attempting to hold pencil and form letters. Information conveyed to pt's medical team with pt's neurologist to order another MRI for evaluation of possible stroke extension. Pt also has a history of bipolar (very remote). If MRI is clear, then might consider a psychological evaluation to assess for possible bipolar flair in response to stress.    SLP Assessment  SLP Recommendation/Assessment: All further Speech Lanaguage Pathology  needs can be addressed in the next venue of care SLP Visit Diagnosis:  (Motor Speech [phone]))    Recommendations for follow up therapy are one component of a multi-disciplinary discharge planning process, led by the attending physician.  Recommendations may be updated based on patient status, additional functional criteria and insurance authorization.    Follow Up Recommendations  Outpatient SLP    Assistance Recommended at Discharge  Frequent or constant Supervision/Assistance  Functional Status Assessment Patient has had a recent decline in their functional status and demonstrates the ability to make significant improvements in function in a reasonable and predictable amount of  time.  Frequency and Duration   N/A        SLP Evaluation Cognition  Overall Cognitive Status: Difficult to assess (but appears to be within normal limits) Arousal/Alertness: Awake/alert Orientation Level: Oriented X4       Comprehension  Auditory Comprehension Overall Auditory Comprehension: Appears within functional limits for tasks assessed Visual Recognition/Discrimination Discrimination: Within Function Limits Reading Comprehension Reading Status: Not tested    Expression Expression Primary Mode of Expression: Verbal Verbal Expression Overall Verbal Expression: Impaired (difficult to assess d/t severity of motor speech disorder) Non-Verbal Means of Communication: Gestures;Writing Written Expression Dominant Hand: Right Written Expression: Exceptions to [facility] (large discoordinated movements but legible)   Oral / Motor  Oral Motor/Sensory Function Overall Oral Motor/Sensory Function: Within functional limits Motor Speech Overall Motor Speech: Impaired Respiration: Impaired Level of Impairment: Word Phonation: Low vocal intensity Resonance: Within functional limits Articulation: Impaired Level of Impairment: Word Intelligibility: Intelligibility reduced Word: 0-24% accurate Phrase: 0-24% accurate Sentence: Not tested Conversation: Not tested Motor Planning: Impaired Level of Impairment: Word Motor Speech Errors: Aware;Consistent;Groping for words Effective Techniques:  (nothing successful)           Kindred Reidinger B. Dreama Saa, M.S., CCC-SLP, Tree surgeon Certified Brain Injury Specialist [facility]  [facility] Office [phone] Ascom [phone] Fax [phone]

## 2022-12-06 NOTE — Progress Notes (Signed)
Subjective: Ataxic gait and handwriting noted by SLP this AM. Repeat MRI brain has been completed.   Objective: Current vital signs: BP 139/79 (BP Location: Right Arm)   Pulse 80   Temp 98.4 F (36.9 C) (Oral)   Resp 15   Ht 5\' 4"  (1.626 m)   Wt 101.7 kg   LMP 10/21/2021 (Approximate) Comment: Urine preg negatvie 03/04/22  SpO2 90%   BMI 38.50 kg/m  Vital signs in last 24 hours: Temp:  [97.8 F (36.6 C)-98.9 F (37.2 C)] 98.4 F (36.9 C) (06/15 1152) Pulse Rate:  [75-86] 80 (06/15 1627) Resp:  [15-27] 15 (06/15 1627) BP: (113-178)/(70-135) 139/79 (06/15 1627) SpO2:  [90 %-100 %] 90 % (06/15 1627)  Intake/Output from previous day: 06/14 0701 - 06/15 0700 In: 480 [P.O.:480] Out: 275 [Urine:275] Intake/Output this shift: Total I/O In: 240 [P.O.:240] Out: -  Nutritional status:  Diet Order             Diet Heart Room service appropriate? Yes; Fluid consistency: Thin  Diet effective now                   HEENT: [place]/AT Lungs: Respirations unlabored Ext: No edema   Neurological Examination Mental Status: Awake, alert and fully oriented. Speech today is unchanged relative to yesterday's exam, continuing to exhibit stuttering that waxes and wanes and does not have a quality consistent with a physiological etiology. No errors of grammar or syntax are noted. Comprehension intact; able to follow all commands without difficulty. Cranial Nerves: II: Temporal visual fields intact with no extinction to DSS. PERRL. III,IV, VI: No ptosis. EOMI. No nystagmus. V: Temp sensation decreased subjectively on the left VII: Smile symmetric VIII: Hearing intact to voice IX,X: No hypophonia or hoarseness XI: Symmetric Motor: RUE: 5/5 LUE: 4/5 with inconsistent effort and giveway weakness RLE: 5/5 LLE: 4/5 with inconsistent effort and giveway weakness Sensory: FT intact x 4 but subjectively decreased to LUE and LLE.  Deep Tendon Reflexes: 2+ and symmetric bilateral biceps,  brachioradialis and patellae Cerebellar: No ataxia with FNF bilaterally Gait: Able to stand with own power. Gait has a erratic limping-like quality involving the LLE that appears non-physiological, particularly the hyper-plantarflexion together with internal rotation of the foot/ankle when taking steps, and a kicking motion when stepping forwards that appears most consistent with embellishment.     Lab Results: Results for orders placed or performed during the hospital encounter of 12/04/22 (from the past 48 hour(s))  MRSA Next Gen by PCR, Nasal     Status: None   Collection Time: 12/04/22  5:15 PM   Specimen: Nasal Mucosa; Nasal Swab  Result Value Ref Range   MRSA by PCR Next Gen NOT DETECTED NOT DETECTED    Comment: (NOTE) The GeneXpert MRSA Assay (FDA approved for NASAL specimens only), is one component of a comprehensive MRSA colonization surveillance program. It is not intended to diagnose MRSA infection nor to guide or monitor treatment for MRSA infections. Test performance is not FDA approved in patients less than 42 years old. Performed at [facility], [address] [place], [place] [address]   Lipid panel     Status: Abnormal   Collection Time: 12/05/22  5:22 AM  Result Value Ref Range   Cholesterol 202 (H) 0 - 200 mg/dL   Triglycerides 67 <604 mg/dL   HDL 58 >54 mg/dL   Total CHOL/HDL Ratio 3.5 RATIO   VLDL 13 0 - 40 mg/dL   LDL Cholesterol 098 (H)  0 - 99 mg/dL    Comment:        Total Cholesterol/HDL:CHD Risk Coronary Heart Disease Risk Table                     Men   Women  1/2 Average Risk   3.4   3.3  Average Risk       5.0   4.4  2 X Average Risk   9.6   7.1  3 X Average Risk  23.4   11.0        Use the calculated Patient Ratio above and the CHD Risk Table to determine the patient's CHD Risk.        ATP III CLASSIFICATION (LDL):  <100     mg/dL   Optimal  644-034  mg/dL   Near or Above                    Optimal  130-159  mg/dL   Borderline   742-595  mg/dL   High  >638     mg/dL   Very High Performed at [facility], [address] [place], [place] [address]   CBC with Differential/Platelet     Status: Abnormal   Collection Time: 12/05/22  5:22 AM  Result Value Ref Range   WBC 7.0 4.0 - 10.5 K/uL   RBC 5.22 (H) 3.87 - 5.11 MIL/uL   Hemoglobin 14.0 12.0 - 15.0 g/dL   HCT 32.9 51.8 - 84.1 %   MCV 84.9 80.0 - 100.0 fL   MCH 26.8 26.0 - 34.0 pg   MCHC 31.6 30.0 - 36.0 g/dL   RDW 66.0 63.0 - 16.0 %   Platelets 250 150 - 400 K/uL   nRBC 0.0 0.0 - 0.2 %   Neutrophils Relative % 63 %   Neutro Abs 4.4 1.7 - 7.7 K/uL   Lymphocytes Relative 28 %   Lymphs Abs 2.0 0.7 - 4.0 K/uL   Monocytes Relative 5 %   Monocytes Absolute 0.3 0.1 - 1.0 K/uL   Eosinophils Relative 3 %   Eosinophils Absolute 0.2 0.0 - 0.5 K/uL   Basophils Relative 1 %   Basophils Absolute 0.1 0.0 - 0.1 K/uL   Immature Granulocytes 0 %   Abs Immature Granulocytes 0.02 0.00 - 0.07 K/uL    Comment: Performed at [facility], [address] [place], [place] [address]  Comprehensive metabolic panel     Status: Abnormal   Collection Time: 12/05/22  5:22 AM  Result Value Ref Range   Sodium 137 135 - 145 mmol/L   Potassium 3.6 3.5 - 5.1 mmol/L   Chloride 107 98 - 111 mmol/L   CO2 23 22 - 32 mmol/L   Glucose, Bld 99 70 - 99 mg/dL    Comment: Glucose reference range applies only to samples taken after fasting for at least 8 hours.   BUN 13 6 - 20 mg/dL   Creatinine, Ser 3.55 0.44 - 1.00 mg/dL   Calcium 8.4 (L) 8.9 - 10.3 mg/dL   Total Protein 7.2 6.5 - 8.1 g/dL   Albumin 3.7 3.5 - 5.0 g/dL   AST 19 15 - 41 U/L   ALT 20 0 - 44 U/L   Alkaline Phosphatase 104 38 - 126 U/L   Total Bilirubin 0.6 0.3 - 1.2 mg/dL   GFR, Estimated >73 >22 mL/min    Comment: (NOTE) Calculated using the CKD-EPI Creatinine Equation (2021)    Anion gap  7 5 - 15    Comment: Performed at [facility], [address] [place], [place] [address]  Magnesium      Status: None   Collection Time: 12/05/22  5:22 AM  Result Value Ref Range   Magnesium 2.1 1.7 - 2.4 mg/dL    Comment: Performed at [facility], [address] [place], [place] [address]  Phosphorus     Status: None   Collection Time: 12/05/22  5:22 AM  Result Value Ref Range   Phosphorus 3.5 2.5 - 4.6 mg/dL    Comment: Performed at [facility], [address] [place], [place] [address]  Hemoglobin A1c     Status: None   Collection Time: 12/05/22  5:22 AM  Result Value Ref Range   Hgb A1c MFr Bld 4.9 4.8 - 5.6 %    Comment: (NOTE) Pre diabetes:          5.7%-6.4%  Diabetes:              >6.4%  Glycemic control for   <7.0% adults with diabetes    Mean Plasma Glucose 93.93 mg/dL    Comment: Performed at [facility], [address], [place], [place] [address]  Antithrombin III     Status: None   Collection Time: 12/05/22 10:54 AM  Result Value Ref Range   AntiThromb III Func 98 75 - 120 %    Comment: Performed at [facility], [address], [place], [place] [address]  Protein C activity     Status: None   Collection Time: 12/05/22 10:54 AM  Result Value Ref Range   Protein C Activity 153 73 - 180 %    Comment: (NOTE) Performed At: [facility] [address] [place], [place] [id number] Jolene Schimke MD [id number]:[id number]   Protein S activity     Status: None   Collection Time: 12/05/22 10:54 AM  Result Value Ref Range   Protein S Activity 77 63 - 140 %    Comment: (NOTE) Protein S activity may be falsely increased (masking an abnormal, low result) in patients receiving direct Xa inhibitor (e.g., rivaroxaban, apixaban, edoxaban) or a direct thrombin inhibitor (e.g., dabigatran) anticoagulant treatment due to assay interference by these drugs. Performed At: [facility] [address] [place], [place] [id number] Jolene Schimke MD [id number]   Protein S, total     Status: None   Collection Time: 12/05/22 10:54 AM   Result Value Ref Range   Protein S Ag, Total 101 60 - 150 %    Comment: (NOTE) This test was developed and its performance characteristics determined by [facility]. It has not been cleared or approved by the Food and Drug Administration. Performed At: [facility] [address] [place], [place] [id number] Jolene Schimke MD YS:[id number]   Lupus anticoagulant panel     Status: None   Collection Time: 12/05/22 10:54 AM  Result Value Ref Range   PTT Lupus Anticoagulant 26.2 0.0 - 43.5 sec   DRVVT 30.0 0.0 - 47.0 sec   Lupus Anticoag Interp Comment:     Comment: (NOTE) No lupus anticoagulant was detected. Performed At: [facility] [address] [place], [place] [id number] Jolene Schimke MD [id number]   Beta-2-glycoprotein i abs, IgG/M/A     Status: None   Collection Time: 12/05/22 10:54 AM  Result Value Ref Range   Beta-2 Glyco I IgG <9 0 - 20 GPI IgG units    Comment: (NOTE) The reference interval  reflects a 3SD or 99th percentile interval, which is thought to represent a potentially clinically significant result in accordance with the International Consensus Statement on the classification criteria for definitive antiphospholipid syndrome (APS). J Thromb Haem 2006;4:295-306.    Beta-2-Glycoprotein I IgM <9 0 - 32 GPI IgM units    Comment: (NOTE) The reference interval reflects a 3SD or 99th percentile interval, which is thought to represent a potentially clinically significant result in accordance with the International Consensus Statement on the classification criteria for definitive antiphospholipid syndrome (APS). J Thromb Haem 2006;4:295-306. Performed At: [facility]: [place] [address] [place], [place] [id number] Jolene Schimke MD [id number]    Beta-2-Glycoprotein I IgA <9 0 - 25 GPI IgA units    Comment: (NOTE) The reference interval reflects a 3SD or 99th percentile interval, which is thought to represent a potentially clinically  significant result in accordance with the International Consensus Statement on the classification criteria for definitive antiphospholipid syndrome (APS). J Thromb Haem 2006;4:295-306.   Homocysteine, serum     Status: None   Collection Time: 12/05/22 10:54 AM  Result Value Ref Range   Homocysteine 10.1 0.0 - 14.5 umol/L    Comment: (NOTE) Performed At: [facility] [address] [place], [place] [id number] Jolene Schimke MD [id number]   Cardiolipin antibodies, IgG, IgM, IgA     Status: None   Collection Time: 12/05/22 10:54 AM  Result Value Ref Range   Anticardiolipin IgG <9 0 - 14 GPL U/mL    Comment: (NOTE)                          Negative:              <15                          Indeterminate:     15 - 20                          Low-Med Positive: >20 - 80                          High Positive:         >80    Anticardiolipin IgM <9 0 - 12 MPL U/mL    Comment: (NOTE)                          Negative:              <13                          Indeterminate:     13 - 20                          Low-Med Positive: >20 - 80                          High Positive:         >80    Anticardiolipin IgA <9 0 - 11 APL U/mL    Comment: (NOTE)                          Negative:              <  12                          Indeterminate:     12 - 20                          Low-Med Positive: >20 - 80                          High Positive:         >80 Performed At: [facility] [place] [address] [place], [place] [id number] Jolene Schimke MD [id number]   CK     Status: None   Collection Time: 12/05/22 10:54 AM  Result Value Ref Range   Total CK 48 38 - 234 U/L    Comment: Performed at [facility], [address] [place], [place] [address]  CBC     Status: None   Collection Time: 12/06/22  4:53 AM  Result Value Ref Range   WBC 6.7 4.0 - 10.5 K/uL   RBC 4.86 3.87 - 5.11 MIL/uL   Hemoglobin 13.3 12.0 - 15.0 g/dL   HCT 30.8 65.7 - 84.6 %   MCV  84.8 80.0 - 100.0 fL   MCH 27.4 26.0 - 34.0 pg   MCHC 32.3 30.0 - 36.0 g/dL   RDW 96.2 95.2 - 84.1 %   Platelets 237 150 - 400 K/uL   nRBC 0.0 0.0 - 0.2 %    Comment: Performed at [facility], [address], [place], [place] [address]  Renal function panel     Status: Abnormal   Collection Time: 12/06/22  4:53 AM  Result Value Ref Range   Sodium 136 135 - 145 mmol/L   Potassium 3.2 (L) 3.5 - 5.1 mmol/L   Chloride 107 98 - 111 mmol/L   CO2 23 22 - 32 mmol/L   Glucose, Bld 107 (H) 70 - 99 mg/dL    Comment: Glucose reference range applies only to samples taken after fasting for at least 8 hours.   BUN 23 (H) 6 - 20 mg/dL   Creatinine, Ser 1.02 0.44 - 1.00 mg/dL   Calcium 8.5 (L) 8.9 - 10.3 mg/dL   Phosphorus 3.3 2.5 - 4.6 mg/dL   Albumin 3.4 (L) 3.5 - 5.0 g/dL   GFR, Estimated >72 >53 mL/min    Comment: (NOTE) Calculated using the CKD-EPI Creatinine Equation (2021)    Anion gap 6 5 - 15    Comment: Performed at [facility], [address] [place] [address], [place], [place] [address]    Recent Results (from the past 240 hour(s))  MRSA Next Gen by PCR, Nasal     Status: None   Collection Time: 12/04/22  5:15 PM   Specimen: Nasal Mucosa; Nasal Swab  Result Value Ref Range Status   MRSA by PCR Next Gen NOT DETECTED NOT DETECTED Final    Comment: (NOTE) The GeneXpert MRSA Assay (FDA approved for NASAL specimens only), is one component of a comprehensive MRSA colonization surveillance program. It is not intended to diagnose MRSA infection nor to guide or monitor treatment for MRSA infections. Test performance is not FDA approved in patients less than 65 years old. Performed at [facility], [address], [place], [place] [address]     Lipid Panel Recent Labs    12/05/22 0522  CHOL 202*  TRIG 67  HDL 58  CHOLHDL 3.5  VLDL 13  LDLCALC 131*    Studies/Results: CT HEAD WO CONTRAST ( )  Result Date: 12/05/2022 CLINICAL DATA:  Recent infarct seen in  previous MRI EXAM: CT HEAD WITHOUT CONTRAST TECHNIQUE: Contiguous axial images were obtained from the base of the skull through the vertex without intravenous contrast. RADIATION DOSE REDUCTION: This exam was performed according to the departmental dose-optimization program which includes automated exposure control, adjustment of the mA and/or kV according to patient size and/or use of iterative reconstruction technique. COMPARISON:  Previous MR brain done on 12/04/2022 and CT brain done earlier today FINDINGS: Brain: No acute intracranial findings are seen. Punctate focus of restricted diffusion seen in left parietal cortex and previous MRI could not be localized in the CT images. Ventricles are not dilated. There are no signs of bleeding within the cranium. There is no focal edema or mass effect. Vascular: Unremarkable. Skull: Unremarkable. Sinuses/Orbits: No acute findings are seen. Other: No significant interval changes are noted in comparison with the study done earlier today IMPRESSION: No acute findings are seen in noncontrast CT brain. No significant interval changes are noted in comparison with the immediate previous CT brain done earlier today. Electronically Signed   By: Ernie Avena M.D.   On: 12/05/2022 17:52   ECHOCARDIOGRAM COMPLETE  Result Date: 12/05/2022    ECHOCARDIOGRAM REPORT   Patient Name:   RESHUNDA URREGO Date of Exam: 12/05/2022 Medical Rec #:  [id number]        Height:       64.0 in Accession #:    [id number]       Weight:       224.3 lb Date of Birth:  [birth year]        BSA:          2.054 m Patient Age:    41 years         BP:           144/97 mmHg Patient Gender: F                HR:           58 bpm. Exam Location:  [facility] Procedure: 2D Echo, Cardiac Doppler and Color Doppler Indications:     Stroke I63.9  History:         Patient has no prior history of Echocardiogram examinations.                  Risk Factors:Hypertension. Migraines.  Sonographer:     Cristela Blue Referring  Phys:  [id number] Ezequiel Essex Diagnosing Phys: Julien Nordmann MD  Sonographer Comments: Suboptimal parasternal window. IMPRESSIONS  1. Left ventricular ejection fraction, by estimation, is 60 to 65%. The left ventricle has normal function. The left ventricle has no regional wall motion abnormalities. Left ventricular diastolic parameters were normal.  2. Right ventricular systolic function is normal. The right ventricular size is normal. There is normal pulmonary artery systolic pressure. The estimated right ventricular systolic pressure is 13.3 mmHg.  3. The mitral valve is normal in structure. No evidence of mitral valve regurgitation. No evidence of mitral stenosis.  4. The aortic valve is normal in structure. Aortic valve regurgitation is not visualized. No aortic stenosis is present.  5. The inferior vena cava is normal in size with greater than 50% respiratory variability, suggesting right atrial pressure of 3 mmHg. FINDINGS  Left Ventricle: Left ventricular ejection fraction, by estimation, is 60 to 65%. The left ventricle has normal function. The left ventricle  has no regional wall motion abnormalities. The left ventricular internal cavity size was normal in size. There is  no left ventricular hypertrophy. Left ventricular diastolic parameters were normal. Right Ventricle: The right ventricular size is normal. No increase in right ventricular wall thickness. Right ventricular systolic function is normal. There is normal pulmonary artery systolic pressure. The tricuspid regurgitant velocity is 1.44 m/s, and  with an assumed right atrial pressure of 5 mmHg, the estimated right ventricular systolic pressure is 13.3 mmHg. Left Atrium: Left atrial size was normal in size. Right Atrium: Right atrial size was normal in size. Pericardium: There is no evidence of pericardial effusion. Mitral Valve: The mitral valve is normal in structure. No evidence of mitral valve regurgitation. No evidence of mitral valve stenosis.  Tricuspid Valve: The tricuspid valve is normal in structure. Tricuspid valve regurgitation is not demonstrated. No evidence of tricuspid stenosis. Aortic Valve: The aortic valve is normal in structure. Aortic valve regurgitation is not visualized. No aortic stenosis is present. Aortic valve mean gradient measures 3.0 mmHg. Aortic valve peak gradient measures 5.8 mmHg. Aortic valve area, by VTI measures 2.43 cm. Pulmonic Valve: The pulmonic valve was normal in structure. Pulmonic valve regurgitation is not visualized. No evidence of pulmonic stenosis. Aorta: The aortic root is normal in size and structure. Venous: The inferior vena cava is normal in size with greater than 50% respiratory variability, suggesting right atrial pressure of 3 mmHg. IAS/Shunts: The interatrial septum appears to be lipomatous. No atrial level shunt detected by color flow Doppler.  LEFT VENTRICLE PLAX 2D LVIDd:         4.60 cm   Diastology LVIDs:         2.90 cm   LV e' medial:    9.79 cm/s LV PW:         1.30 cm   LV E/e' medial:  6.6 LV IVS:        1.10 cm   LV e' lateral:   9.25 cm/s LVOT diam:     2.00 cm   LV E/e' lateral: 7.0 LV SV:         50 LV SV Index:   24 LVOT Area:     3.14 cm  RIGHT VENTRICLE RV Basal diam:  2.80 cm RV Mid diam:    2.80 cm LEFT ATRIUM             Index       RIGHT ATRIUM           Index LA diam:        4.10 cm 2.00 cm/m  RA Area:     11.10 cm LA Vol (A2C):   18.6 ml 9.06 ml/m  RA Volume:   23.70 ml  11.54 ml/m LA Vol (A4C):   20.0 ml 9.74 ml/m LA Biplane Vol: 20.4 ml 9.93 ml/m  AORTIC VALVE AV Area (Vmax):    2.15 cm AV Area (Vmean):   2.45 cm AV Area (VTI):     2.43 cm AV Vmax:           120.00 cm/s AV Vmean:          84.900 cm/s AV VTI:            0.204 m AV Peak Grad:      5.8 mmHg AV Mean Grad:      3.0 mmHg LVOT Vmax:         82.20 cm/s LVOT Vmean:        66.100  cm/s LVOT VTI:          0.158 m LVOT/AV VTI ratio: 0.77  AORTA Ao Root diam: 2.60 cm MITRAL VALVE               TRICUSPID VALVE MV Area  (PHT): 3.24 cm    TR Peak grad:   8.3 mmHg MV Decel Time: 234 msec    TR Vmax:        144.00 cm/s MV E velocity: 65.00 cm/s MV A velocity: 55.50 cm/s  SHUNTS MV E/A ratio:  1.17        Systemic VTI:  0.16 m                            Systemic Diam: 2.00 cm Julien Nordmann MD Electronically signed by Julien Nordmann MD Signature Date/Time: 12/05/2022/5:04:02 PM    Final    CT ANGIO HEAD NECK W WO CM  Result Date: 12/05/2022 CLINICAL DATA:  Stroke, follow-up. EXAM: CT ANGIOGRAPHY HEAD AND NECK WITH AND WITHOUT CONTRAST TECHNIQUE: Multidetector CT imaging of the head and neck was performed using the standard protocol during bolus administration of intravenous contrast. Multiplanar CT image reconstructions and MIPs were obtained to evaluate the vascular anatomy. Carotid stenosis measurements (when applicable) are obtained utilizing NASCET criteria, using the distal internal carotid diameter as the denominator. RADIATION DOSE REDUCTION: This exam was performed according to the departmental dose-optimization program which includes automated exposure control, adjustment of the mA and/or kV according to patient size and/or use of iterative reconstruction technique. CONTRAST:  75mL OMNIPAQUE IOHEXOL 350 MG/ML SOLN COMPARISON:  MR angiogram December 04, 2022. FINDINGS: CTA NECK FINDINGS Aortic arch: Imaged portion shows no evidence of aneurysm or dissection. No significant stenosis of the major arch vessel origins. Right carotid system: No evidence of dissection, stenosis (50% or greater), or occlusion. Left carotid system: No evidence of dissection, stenosis (50% or greater), or occlusion. Vertebral arteries: Codominant. No evidence of dissection, stenosis (50% or greater), or occlusion. Skeleton: Negative. Other neck: Negative. Upper chest: Negative. Review of the MIP images confirms the above findings CTA HEAD FINDINGS Anterior circulation: No significant stenosis, proximal occlusion, aneurysm, or vascular malformation.  Posterior circulation: No significant stenosis, proximal occlusion, aneurysm, or vascular malformation. Venous sinuses: As permitted by contrast timing, patent. Anatomic variants: None significant. Review of the MIP images confirms the above findings IMPRESSION: 1. No intracranial large vessel occlusion, significant stenosis, aneurysm, or vascular malformation. 2. No significant stenosis of the major neck arteries. Electronically Signed   By: Baldemar Lenis M.D.   On: 12/05/2022 11:58   CT HEAD WO CONTRAST ( )  Result Date: 12/05/2022 CLINICAL DATA:  Altered mental status, unknown cause EXAM: CT HEAD WITHOUT CONTRAST TECHNIQUE: Contiguous axial images were obtained from the base of the skull through the vertex without intravenous contrast. RADIATION DOSE REDUCTION: This exam was performed according to the departmental dose-optimization program which includes automated exposure control, adjustment of the mA and/or kV according to patient size and/or use of iterative reconstruction technique. COMPARISON:  12/04/2022 CT head and MRI head FINDINGS: Brain: The small focus of restricted diffusion noted on the 12/04/2022 MRI does not have a CT correlate. No evidence of additional acute infarction, hemorrhage, mass, mass effect, or midline shift. No hydrocephalus or extra-axial fluid collection. Vascular: No hyperdense vessel. Skull: Negative for fracture or focal lesion. Sinuses/Orbits: No acute finding. Other: The mastoid air cells are well aerated. IMPRESSION: No acute intracranial  process. The small focus of restricted diffusion noted on the 12/04/2022 MRI does not have a CT correlate. Electronically Signed   By: Wiliam Ke M.D.   On: 12/05/2022 02:28    Medications: Scheduled:  atorvastatin  20 mg Oral Daily   Chlorhexidine Gluconate Cloth  6 each Topical Daily   labetalol  20 mg Intravenous Once   lisinopril  5 mg Oral Daily   pantoprazole  40 mg Oral QHS   potassium chloride  40 mEq  Oral Once   sodium chloride flush  3 mL Intravenous Once   Continuous:  Assessment: 42 year old female presenting with acute onset of left sided weakness and numbness with left facial droop. Initial exam revealed left sided deficits with NIHSS of 4. There was some question of functionality on exam, requiring MRI brain for further assessment. 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 TNK was administered and the patient was admitted for stroke work up. During her stay, she has not exhibited any symptoms or signs referable to the left parietal infarcts, but has exhibited waxing and waning stuttering speech, left sided giveway weakness and limping gait that do not have any correlating abnormalities on MRI brain.  - Exam today reveals no objective neurological deficit. Non-physiological waxing and waning stuttering and new onset of staggering gait is noted. Speech is again without impaired comprehension and also with no errors of syntax or grammar, having a quality most consistent with nonphysiological etiology. Her gait also has a quality that is not typical for a lesional cause in either the central or peripheral nervous system and appears most likely to be embellished/nonphysiological.  - Imaging studies: - MRI brain: Punctate focus of restricted diffusion in the left parietal lobe, consistent with acute/subacute infarct. Multiple small T2 hyperintense foci in the left parietal lobe, adjacent to the focus of restricted diffusion, suggestive of prior small infarcts. - MRA head: No high-grade flow-limiting stenosis or proximal branch occlusion of the intracranial circulation.  - CT head: No acute intracranial hemorrhage or evidence of evolving large vessel territory infarct. ASPECT score is 10. - CTA of head and neck: No intracranial large vessel occlusion, significant stenosis, aneurysm, or  vascular malformation. No significant stenosis of the major neck arteries. - Repeat MRI brain w/wo contrast obtained today: Personally reviewed, with no changes seen from prior MRI, other than absence of the previously seen punctate focus or restricted diffusion. No abnormal enhancement appreciated.  - TTE: Left ventricular ejection fraction, by estimation, is 60 to 65%. The left ventricle has normal function. The left ventricle has no regional wall motion abnormalities. Left ventricular diastolic parameters were normal. Right ventricular systolic function is normal. The right ventricular size is normal. There is normal pulmonary artery systolic pressure. The estimated right ventricular systolic pressure is 13.3 mmHg. The mitral valve is normal in structure. No evidence of mitral valve regurgitation. No evidence of mitral stenosis. The aortic valve is normal in structure. Aortic valve regurgitation is not visualized. No aortic stenosis is present. The inferior vena cava is normal in size with greater than 50% respiratory variability, suggesting right atrial pressure of 3 mmHg.  - Labs: HgbA1c in March was normal. Elevated total cholesterol of 202. LDL elevated at 131. AST and ALT are normal.  - New history obtained from the patient yesterday. She states that she had been on an ARB previously, but that it had been discontinued by her PCP  due to it apparently no longer being indicated. About 2 weeks ago, she started taking again what ARB tablets she had left, as directed on the bottle, and had been planning to visit her PCP again to have the rx refilled. She is not currently on any prescribed antihypertensives outpatient and she has not been started on scheduled-dose antihypertensive inpatient. SBP noted to be high during assessment today.  - Overall impression: Although there are objective MRI findings in the left parietal lobe that are most consistent with prior punctate acute strokes, her current presentation  is most consistent with a functional/nonphysiological etiology. A combination of CNS pathology with functional overlay best summarizes her presentation this admission.    Recommendations: - Portions of her hypercoagulable panel is pending. Will take about 2 weeks to fully result. Initial results of the panel are negative. - SBP goal of 130-150  - ASA 81 mg po qd. - DVT prophylaxis with SCDs.  - Continue atorvastatin.   - PT/OT/Speech.  - Telemetry monitoring - Risk factor modification to include weight loss and light exercise - Continue lisinopril 5 mg po every day. BPs improved today, but not yet fully optimized. Will defer to primary team for possible adjustments. .  - Neurhospitalist service will sign off. Please call if there are additional questions.    LOS: 2 days   @Electronically  signed: Dr. Caryl Pina 12/06/2022  4:39 PM

## 2022-12-06 NOTE — Progress Notes (Signed)
PHARMACIST - PHYSICIAN COMMUNICATION  CONCERNING: IV to Oral Route Change Policy  RECOMMENDATION: This patient is receiving pantoprazole by the intravenous route.  Based on criteria approved by the Pharmacy and Therapeutics Committee, the intravenous medication(s) is/are being converted to the equivalent oral dose form(s).  DESCRIPTION: These criteria include: The patient is eating (either orally or via tube) and/or has been taking other orally administered medications for a least 24 hours The patient has no evidence of active gastrointestinal bleeding or impaired GI absorption (gastrectomy, short bowel, patient on TNA or NPO).  If you have questions about this conversion, please contact the Pharmacy Department   Tressie Ellis, [facility] 12/06/2022 3:13 PM

## 2022-12-07 DIAGNOSIS — I639 Cerebral infarction, unspecified: Secondary | ICD-10-CM | POA: Diagnosis not present

## 2022-12-07 LAB — RENAL FUNCTION PANEL
Albumin: 3.4 g/dL — ABNORMAL LOW (ref 3.5–5.0)
Anion gap: 7 (ref 5–15)
BUN: 23 mg/dL — ABNORMAL HIGH (ref 6–20)
CO2: 23 mmol/L (ref 22–32)
Calcium: 8.6 mg/dL — ABNORMAL LOW (ref 8.9–10.3)
Chloride: 107 mmol/L (ref 98–111)
Creatinine, Ser: 0.66 mg/dL (ref 0.44–1.00)
GFR, Estimated: >60 mL/min (ref >60)
Glucose, Bld: 97 mg/dL (ref 70–99)
Phosphorus: 4.2 mg/dL (ref 2.5–4.6)
Potassium: 3.6 mmol/L (ref 3.5–5.1)
Sodium: 137 mmol/L (ref 135–145)

## 2022-12-07 LAB — CBC
HCT: 41.5 % (ref 36.0–46.0)
Hemoglobin: 13 g/dL (ref 12.0–15.0)
MCH: 26.7 pg (ref 26.0–34.0)
MCHC: 31.3 g/dL (ref 30.0–36.0)
MCV: 85.2 fL (ref 80.0–100.0)
Platelets: 230 10*3/uL (ref 150–400)
RBC: 4.87 MIL/uL (ref 3.87–5.11)
RDW: 14.4 % (ref 11.5–15.5)
WBC: 6.3 10*3/uL (ref 4.0–10.5)
nRBC: 0 % (ref 0.0–0.2)

## 2022-12-07 LAB — PROTEIN C, TOTAL: Protein C, Total: 85 % (ref 60–150)

## 2022-12-07 MED ORDER — ASPIRIN 81 MG PO CHEW
81.0000 mg | CHEWABLE_TABLET | Freq: Every day | ORAL | Status: DC
  Administered 2022-12-07 – 2022-12-09 (×3): 81 mg via ORAL
  Filled 2022-12-07 (×3): qty 1

## 2022-12-07 NOTE — Progress Notes (Signed)
PROGRESS NOTE    Bridget Maldonado  NWG:[id number] DOB: [birth year] DOA: 12/04/2022 PCP: Karie Schwalbe, MD   Assessment & Plan:   Principal Problem:   Acute CVA (cerebrovascular accident) [facility]) Active Problems:   Essential hypertension   Obesity (BMI 30-39.9)   Hypokalemia  Assessment and Plan:  Acute/subacute CVA: of left parietal lobe. S/p TNK administration. Continue on aspirin, statin. Hypercoagulable work-up pending. PT/OT recs inpatient rehab. Echo is unremarkable. HbA1c 4.9. Urine drug screen should have been done on admission but wasn't so will order now, unlikely to show anything   HTN: continue on lisinopril. IV hydralazine  Hypokalemia: WNL today   Obesity: BMI 38.5. Would benefit from weight loss      DVT prophylaxis: SCDs Code Status: full  Family Communication: discussed pt's care w/ pt's family at bedside and answered their questions  Disposition Plan: inpatient rehab vs outpatient rehab.  Level of care: Med-Surg  Status is: Inpatient Remains inpatient appropriate because: severity of illness    Consultants:  Neuro   Procedures:  Antimicrobials:    Subjective: Pt c/o fatigue  Objective: Vitals:   12/06/22 2003 12/07/22 0018 12/07/22 0502 12/07/22 0734  BP: 135/89 (!) 143/94 (!) 132/91 (!) 120/90  Pulse: 78 66 (!) 58 72  Resp: 16 12 16 16   Temp: 98.3 F (36.8 C) 97.8 F (36.6 C) (!) 97.5 F (36.4 C) 97.9 F (36.6 C)  TempSrc:      SpO2: 98% 96% 98% 97%  Weight:      Height:        Intake/Output Summary (Last 24 hours) at 12/07/2022 0744 Last data filed at 12/07/2022 0600 Gross per 24 hour  Intake 960 ml  Output --  Net 960 ml   Filed Weights   12/04/22 1341  Weight: 101.7 kg    Examination:  General exam: Appears calm and comfortable.  Respiratory system: Clear to auscultation. Respiratory effort normal. Cardiovascular system: S1 & S2+. No  rubs, gallops or clicks. No pedal edema. Gastrointestinal system: Abdomen is  nondistended, soft and nontender.  Normal bowel sounds heard. Central nervous system: Alert and oriented. Dysarthria  Psychiatry: Judgement and insight appears baseline. Mood & affect appropriate.     Data Reviewed: I have personally reviewed following labs and imaging studies  CBC: Recent Labs  Lab 12/02/22 1723 12/04/22 1515 12/05/22 0522 12/06/22 0453 12/07/22 0443  WBC 7.7 8.0 7.0 6.7 6.3  NEUTROABS 4.7 5.6 4.4  --   --   HGB 14.0 13.5 14.0 13.3 13.0  HCT 44.0 41.9 44.3 41.2 41.5  MCV 85.4 84.8 84.9 84.8 85.2  PLT 260 262 250 237 230   Basic Metabolic Panel: Recent Labs  Lab 12/02/22 1723 12/04/22 1515 12/05/22 0522 12/06/22 0453 12/07/22 0443  NA 137 139 137 136 137  K 3.6 3.3* 3.6 3.2* 3.6  CL 106 107 107 107 107  CO2 22 24 23 23 23   GLUCOSE 118* 131* 99 107* 97  BUN 18 14 13  23* 23*  CREATININE 0.71 0.77 0.60 0.66 0.66  CALCIUM 9.0 8.9 8.4* 8.5* 8.6*  MG  --   --  2.1  --   --   PHOS  --   --  3.5 3.3 4.2   GFR: Estimated Creatinine Clearance: 109.5 mL/min (by C-G formula based on SCr of 0.66 mg/dL). Liver Function Tests: Recent Labs  Lab 12/02/22 1723 12/04/22 1515 12/05/22 0522 12/06/22 0453 12/07/22 0443  AST 23 20 19   --   --  ALT 24 20 20   --   --   ALKPHOS 96 104 104  --   --   BILITOT 0.4 0.3 0.6  --   --   PROT 7.0 7.0 7.2  --   --   ALBUMIN 3.8 3.6 3.7 3.4* 3.4*   Recent Labs  Lab 12/02/22 1723  LIPASE 48   No results for input(s): "AMMONIA" in the last 168 hours. Coagulation Profile: Recent Labs  Lab 12/04/22 1515  INR 1.0   Cardiac Enzymes: Recent Labs  Lab 12/05/22 1054  CKTOTAL 48   BNP (last 3 results) No results for input(s): "PROBNP" in the last 8760 hours. HbA1C: Recent Labs    12/05/22 0522  HGBA1C 4.9   CBG: Recent Labs  Lab 12/04/22 1337 12/04/22 1559  GLUCAP 136* 128*   Lipid Profile: Recent Labs    12/05/22 0522  CHOL 202*  HDL 58  LDLCALC 131*  TRIG 67  CHOLHDL 3.5   Thyroid Function  Tests: No results for input(s): "TSH", "T4TOTAL", "FREET4", "T3FREE", "THYROIDAB" in the last 72 hours. Anemia Panel: No results for input(s): "VITAMINB12", "FOLATE", "FERRITIN", "TIBC", "IRON", "RETICCTPCT" in the last 72 hours. Sepsis Labs: No results for input(s): "PROCALCITON", "LATICACIDVEN" in the last 168 hours.  Recent Results (from the past 240 hour(s))  MRSA Next Gen by PCR, Nasal     Status: None   Collection Time: 12/04/22  5:15 PM   Specimen: Nasal Mucosa; Nasal Swab  Result Value Ref Range Status   MRSA by PCR Next Gen NOT DETECTED NOT DETECTED Final    Comment: (NOTE) The GeneXpert MRSA Assay (FDA approved for NASAL specimens only), is one component of a comprehensive MRSA colonization surveillance program. It is not intended to diagnose MRSA infection nor to guide or monitor treatment for MRSA infections. Test performance is not FDA approved in patients less than 43 years old. Performed at [facility], [address], [place], [place] [address]          Radiology Studies: MR BRAIN W WO CONTRAST  Result Date: 12/06/2022 CLINICAL DATA:  Neuro deficit, acute stroke suspected. Question demyelinating disease. EXAM: MRI HEAD WITHOUT AND WITH CONTRAST TECHNIQUE: Multiplanar, multiecho pulse sequences of the brain and surrounding structures were obtained without and with intravenous contrast. CONTRAST:  10mL GADAVIST GADOBUTROL 1 MMOL/ML IV SOLN COMPARISON:  CT head and CT angio head and neck 12/05/2022. MR head and MRA head 12/04/2022 FINDINGS: Brain: Punctate focus in the scratched at punctate focus of restricted diffusion in the left parietal lobe is less conspicuous on today's study. T2 and FLAIR signal changes in the same location are again noted. Additional cortical and subcortical T2 hyperintensities are present in the left parietal lobe. No other significant white matter disease is present. Deep brain nuclei are within normal limits. The ventricles are of  normal size. No significant extraaxial fluid collection is present. The brainstem and cerebellum are within normal limits. The internal auditory canals are within normal limits. Midline structures are within normal limits. Vascular: Flow is present in the major intracranial arteries. Skull and upper cervical spine: The craniocervical junction is normal. Upper cervical spine is within normal limits. Marrow signal is unremarkable. Sinuses/Orbits: The paranasal sinuses and mastoid air cells are clear. The globes and orbits are within normal limits. Other: IMPRESSION: 1. Punctate focus of restricted diffusion in the left parietal lobe is less conspicuous on today's study. 2. Additional cortical and subcortical T2 hyperintensities in the high left parietal lobe are again  noted, consistent remote infarcts in this area. 3. No other acute intracranial abnormality or significant interval change. Electronically Signed   By: Marin Roberts M.D.   On: 12/06/2022 17:54   CT HEAD WO CONTRAST ( )  Result Date: 12/05/2022 CLINICAL DATA:  Recent infarct seen in previous MRI EXAM: CT HEAD WITHOUT CONTRAST TECHNIQUE: Contiguous axial images were obtained from the base of the skull through the vertex without intravenous contrast. RADIATION DOSE REDUCTION: This exam was performed according to the departmental dose-optimization program which includes automated exposure control, adjustment of the mA and/or kV according to patient size and/or use of iterative reconstruction technique. COMPARISON:  Previous MR brain done on 12/04/2022 and CT brain done earlier today FINDINGS: Brain: No acute intracranial findings are seen. Punctate focus of restricted diffusion seen in left parietal cortex and previous MRI could not be localized in the CT images. Ventricles are not dilated. There are no signs of bleeding within the cranium. There is no focal edema or mass effect. Vascular: Unremarkable. Skull: Unremarkable. Sinuses/Orbits: No  acute findings are seen. Other: No significant interval changes are noted in comparison with the study done earlier today IMPRESSION: No acute findings are seen in noncontrast CT brain. No significant interval changes are noted in comparison with the immediate previous CT brain done earlier today. Electronically Signed   By: Ernie Avena M.D.   On: 12/05/2022 17:52   ECHOCARDIOGRAM COMPLETE  Result Date: 12/05/2022    ECHOCARDIOGRAM REPORT   Patient Name:   KEBRINA CUNNINGHAM Date of Exam: 12/05/2022 Medical Rec #:  [id number]        Height:       64.0 in Accession #:    [id number]       Weight:       224.3 lb Date of Birth:  1980-07-05        BSA:          2.054 m Patient Age:    41 years         BP:           144/97 mmHg Patient Gender: F                HR:           58 bpm. Exam Location:  [facility] Procedure: 2D Echo, Cardiac Doppler and Color Doppler Indications:     Stroke I63.9  History:         Patient has no prior history of Echocardiogram examinations.                  Risk Factors:Hypertension. Migraines.  Sonographer:     Cristela Blue Referring Phys:  [id number] Ezequiel Essex Diagnosing Phys: Julien Nordmann MD  Sonographer Comments: Suboptimal parasternal window. IMPRESSIONS  1. Left ventricular ejection fraction, by estimation, is 60 to 65%. The left ventricle has normal function. The left ventricle has no regional wall motion abnormalities. Left ventricular diastolic parameters were normal.  2. Right ventricular systolic function is normal. The right ventricular size is normal. There is normal pulmonary artery systolic pressure. The estimated right ventricular systolic pressure is 13.3 mmHg.  3. The mitral valve is normal in structure. No evidence of mitral valve regurgitation. No evidence of mitral stenosis.  4. The aortic valve is normal in structure. Aortic valve regurgitation is not visualized. No aortic stenosis is present.  5. The inferior vena cava is normal in size with greater than 50%  respiratory variability, suggesting right atrial pressure of 3  mmHg. FINDINGS  Left Ventricle: Left ventricular ejection fraction, by estimation, is 60 to 65%. The left ventricle has normal function. The left ventricle has no regional wall motion abnormalities. The left ventricular internal cavity size was normal in size. There is  no left ventricular hypertrophy. Left ventricular diastolic parameters were normal. Right Ventricle: The right ventricular size is normal. No increase in right ventricular wall thickness. Right ventricular systolic function is normal. There is normal pulmonary artery systolic pressure. The tricuspid regurgitant velocity is 1.44 m/s, and  with an assumed right atrial pressure of 5 mmHg, the estimated right ventricular systolic pressure is 13.3 mmHg. Left Atrium: Left atrial size was normal in size. Right Atrium: Right atrial size was normal in size. Pericardium: There is no evidence of pericardial effusion. Mitral Valve: The mitral valve is normal in structure. No evidence of mitral valve regurgitation. No evidence of mitral valve stenosis. Tricuspid Valve: The tricuspid valve is normal in structure. Tricuspid valve regurgitation is not demonstrated. No evidence of tricuspid stenosis. Aortic Valve: The aortic valve is normal in structure. Aortic valve regurgitation is not visualized. No aortic stenosis is present. Aortic valve mean gradient measures 3.0 mmHg. Aortic valve peak gradient measures 5.8 mmHg. Aortic valve area, by VTI measures 2.43 cm. Pulmonic Valve: The pulmonic valve was normal in structure. Pulmonic valve regurgitation is not visualized. No evidence of pulmonic stenosis. Aorta: The aortic root is normal in size and structure. Venous: The inferior vena cava is normal in size with greater than 50% respiratory variability, suggesting right atrial pressure of 3 mmHg. IAS/Shunts: The interatrial septum appears to be lipomatous. No atrial level shunt detected by color flow  Doppler.  LEFT VENTRICLE PLAX 2D LVIDd:         4.60 cm   Diastology LVIDs:         2.90 cm   LV e' medial:    9.79 cm/s LV PW:         1.30 cm   LV E/e' medial:  6.6 LV IVS:        1.10 cm   LV e' lateral:   9.25 cm/s LVOT diam:     2.00 cm   LV E/e' lateral: 7.0 LV SV:         50 LV SV Index:   24 LVOT Area:     3.14 cm  RIGHT VENTRICLE RV Basal diam:  2.80 cm RV Mid diam:    2.80 cm LEFT ATRIUM             Index       RIGHT ATRIUM           Index LA diam:        4.10 cm 2.00 cm/m  RA Area:     11.10 cm LA Vol (A2C):   18.6 ml 9.06 ml/m  RA Volume:   23.70 ml  11.54 ml/m LA Vol (A4C):   20.0 ml 9.74 ml/m LA Biplane Vol: 20.4 ml 9.93 ml/m  AORTIC VALVE AV Area (Vmax):    2.15 cm AV Area (Vmean):   2.45 cm AV Area (VTI):     2.43 cm AV Vmax:           120.00 cm/s AV Vmean:          84.900 cm/s AV VTI:            0.204 m AV Peak Grad:      5.8 mmHg AV Mean Grad:  3.0 mmHg LVOT Vmax:         82.20 cm/s LVOT Vmean:        66.100 cm/s LVOT VTI:          0.158 m LVOT/AV VTI ratio: 0.77  AORTA Ao Root diam: 2.60 cm MITRAL VALVE               TRICUSPID VALVE MV Area (PHT): 3.24 cm    TR Peak grad:   8.3 mmHg MV Decel Time: 234 msec    TR Vmax:        144.00 cm/s MV E velocity: 65.00 cm/s MV A velocity: 55.50 cm/s  SHUNTS MV E/A ratio:  1.17        Systemic VTI:  0.16 m                            Systemic Diam: 2.00 cm Julien Nordmann MD Electronically signed by Julien Nordmann MD Signature Date/Time: 12/05/2022/5:04:02 PM    Final    CT ANGIO HEAD NECK W WO CM  Result Date: 12/05/2022 CLINICAL DATA:  Stroke, follow-up. EXAM: CT ANGIOGRAPHY HEAD AND NECK WITH AND WITHOUT CONTRAST TECHNIQUE: Multidetector CT imaging of the head and neck was performed using the standard protocol during bolus administration of intravenous contrast. Multiplanar CT image reconstructions and MIPs were obtained to evaluate the vascular anatomy. Carotid stenosis measurements (when applicable) are obtained utilizing NASCET criteria,  using the distal internal carotid diameter as the denominator. RADIATION DOSE REDUCTION: This exam was performed according to the departmental dose-optimization program which includes automated exposure control, adjustment of the mA and/or kV according to patient size and/or use of iterative reconstruction technique. CONTRAST:  75mL OMNIPAQUE IOHEXOL 350 MG/ML SOLN COMPARISON:  MR angiogram December 04, 2022. FINDINGS: CTA NECK FINDINGS Aortic arch: Imaged portion shows no evidence of aneurysm or dissection. No significant stenosis of the major arch vessel origins. Right carotid system: No evidence of dissection, stenosis (50% or greater), or occlusion. Left carotid system: No evidence of dissection, stenosis (50% or greater), or occlusion. Vertebral arteries: Codominant. No evidence of dissection, stenosis (50% or greater), or occlusion. Skeleton: Negative. Other neck: Negative. Upper chest: Negative. Review of the MIP images confirms the above findings CTA HEAD FINDINGS Anterior circulation: No significant stenosis, proximal occlusion, aneurysm, or vascular malformation. Posterior circulation: No significant stenosis, proximal occlusion, aneurysm, or vascular malformation. Venous sinuses: As permitted by contrast timing, patent. Anatomic variants: None significant. Review of the MIP images confirms the above findings IMPRESSION: 1. No intracranial large vessel occlusion, significant stenosis, aneurysm, or vascular malformation. 2. No significant stenosis of the major neck arteries. Electronically Signed   By: Baldemar Lenis M.D.   On: 12/05/2022 11:58        Scheduled Meds:  atorvastatin  20 mg Oral Daily   Chlorhexidine Gluconate Cloth  6 each Topical Daily   labetalol  20 mg Intravenous Once   lisinopril  5 mg Oral Daily   pantoprazole  40 mg Oral QHS   sodium chloride flush  3 mL Intravenous Once   Continuous Infusions:   LOS: 3 days    Time spent: 25 mins     Charise Killian, MD [facility] Pager [phone]  If 7PM-7AM, please contact night-coverage [email or web address] 12/07/2022, 7:44 AM

## 2022-12-07 NOTE — Progress Notes (Signed)
Occupational Therapy * Physical Therapy * Speech Therapy  DATE: 6/16:  PATIENT NAME:  Bridget Maldonado  PATIENT MRN: [id number]   DIAGNOSIS/DIAGNOSIS CODE: I63.9 DATE OF DISCHARGE: 12/07/22  PRIMARY CARE PHYSICIAN: Tillman Abide  PCP PHONE/FAX: [phone]    Dear Provider: [facility]:   Fax:[phone]   I certify that I have examined this patient and that occupational/physical/speech therapy is necessary on an outpatient basis.    The patient has expressed interest in completing their recommended course of therapy at your location.  Once a formal order from the patient's primary care physician has been obtained, please contact him/her to schedule an appointment for evaluation at your earliest convenience.  [  x]  Physical Therapy Evaluate and Treat  [ x ]  Occupational Therapy Evaluate and Treat  [  x]  Speech Therapy Evaluate and Treat  The patient's primary care physician (listed above) must furnish and be responsible for a formal order such that the recommended services may be furnished while under the primary physician's care, and that the plan of care will be established and reviewed every 30 days (or more often if condition necessitates).

## 2022-12-07 NOTE — TOC Progression Note (Addendum)
Transition of Care [facility]) - Progression Note    Patient Details  Name: Bridget Maldonado MRN: [id number] Date of Birth: 12-Aug-1980  Transition of Care [facility]) CM/SW Contact  Bing Quarry, RN Phone Number: 12/07/2022, 10:33 AM  Clinical Narrative:  12/06/22:  Pt came to ED on 613/24 in private vehicle for symptoms of stroke with Left facial droop and Left arm and facial numbness, and slurred speech. Code Stroke called and patient received TNK but not IR. Was in ICU. Speech consulted and rec outpatient SLP or next venue of care. A repeat MRI was done for Ataxic gait and handwriting noted by SLP on 12/06/22. On 6/15 PT was able to eval pt around 330 pm, and recommended CIR. 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 Parents can help out some on discharge per PT note.  Parents told PT they could help on discharge.  Has PCP: Dr Tillman Abide. TOC to follow for needs. Gabriel Cirri RN CM   Update: Outpatient recommended for PT/SLP. Spoke with patient regarding choices and chose [facility]. Progress order placed for co-sign and routing to [facility] via EPIC. Patient's mother has a RW for patient to use per therapy recommendations for DME needs.  Gabriel Cirri RN CM        Expected Discharge Plan and Services                                               Social Determinants of Health (SDOH) Interventions SDOH Screenings   Depression [phone]): Medium Risk (09/17/2022)  Tobacco Use: Low Risk  (12/02/2022)    Readmission Risk Interventions     No data to display

## 2022-12-07 NOTE — Progress Notes (Addendum)
Physical Therapy Treatment Patient Details Name: Bridget Maldonado MRN: [id number] DOB: Oct 18, 1980 Today's Date: 12/07/2022   History of Present Illness Per chart, 42 year old female presenting with acute onset of left sided weakness and numbness with left facial droop. Initial exam revealed left sided deficits with NIHSS of 4. There was some question of functionality on exam, requiring MRI brain for further assessment. 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 TNK was administered and the patient was admitted for stroke work up.    PT Comments    Pt in bed with nursing and mother in room, pt is A&Ox4. Pt denies any pain. Pt performed bed mobility c/ supervision, transfers and ambulation c/ RW with cueing for correcting AD deviation. Pt ambulating ~234ft with notably decreased LLE foot drop, increased gait speed, and pt reports feeling more stable. Pt able to perform LE exercises, demonstrates slight improvement in LLE dorsiflexion from previous session.  Overall pt with progression in mobility today, care team updated on pt progress. Pt left upright in chair c/ mother present, needs within reach, alarm set. Pt would benefit from continued PT interventions to improve functional mobility and return to PLOF.    Recommendations for follow up therapy are one component of a multi-disciplinary discharge planning process, led by the attending physician.  Recommendations may be updated based on patient status, additional functional criteria and insurance authorization.  Follow Up Recommendations       Assistance Recommended at Discharge Intermittent Supervision/Assistance  Patient can return home with the following A little help with walking and/or transfers;A lot of help with bathing/dressing/bathroom;Assistance with cooking/housework;Assist for transportation;Help with stairs or ramp for  entrance   Equipment Recommendations  Rolling walker (2 wheels)    Recommendations for Other Services       Precautions / Restrictions Precautions Precautions: Fall Restrictions Weight Bearing Restrictions: No     Mobility  Bed Mobility Overal bed mobility: Needs Assistance Bed Mobility: Supine to Sit     Supine to sit: Supervision, HOB elevated          Transfers Overall transfer level: Needs assistance Equipment used: Rolling walker (2 wheels) Transfers: Sit to/from Stand Sit to Stand: Min guard                Ambulation/Gait Ambulation/Gait assistance: Min guard Gait Distance (Feet): 200 Feet Assistive device: Rolling walker (2 wheels) Gait Pattern/deviations: Step-through pattern, Drifts right/left       General Gait Details: more fluid step through pattern, notable decrease in BLE foot drop   Stairs             Wheelchair Mobility    Modified Rankin (Stroke Patients Only)       Balance Overall balance assessment: Needs assistance Sitting-balance support: Feet supported Sitting balance-Leahy Scale: Fair     Standing balance support: Bilateral upper extremity supported, Reliant on assistive device for balance Standing balance-Leahy Scale: Fair                              Cognition Arousal/Alertness: Awake/alert Behavior During Therapy: WFL for tasks assessed/performed Overall Cognitive Status: Within Functional Limits for tasks assessed  Exercises General Exercises - Lower Extremity Long Arc Quad: AROM, Both, 10 reps, Seated Toe Raises: AROM, Both, 10 reps, Seated Heel Raises: AROM, Both, 10 reps, Seated    General Comments        Pertinent Vitals/Pain Pain Assessment Pain Assessment: No/denies pain    Home Living                          Prior Function            PT Goals (current goals can now be found in the care plan section)  Progress towards PT goals: Progressing toward goals    Frequency    Min 4X/week      PT Plan Discharge plan needs to be updated    Co-evaluation              AM-PAC PT "6 Clicks" Mobility   Outcome Measure  Help needed turning from your back to your side while in a flat bed without using bedrails?: None Help needed moving from lying on your back to sitting on the side of a flat bed without using bedrails?: None Help needed moving to and from a bed to a chair (including a wheelchair)?: A Little Help needed standing up from a chair using your arms (e.g., wheelchair or bedside chair)?: A Little Help needed to walk in hospital room?: A Little Help needed climbing 3-5 steps with a railing? : A Lot 6 Click Score: 19    End of Session Equipment Utilized During Treatment: Gait belt Activity Tolerance: Patient tolerated treatment well Patient left: in chair;with call bell/phone within reach;with chair alarm set;with family/visitor present Nurse Communication: Mobility status PT Visit Diagnosis: Ataxic gait (R26.0);Unsteadiness on feet (R26.81);Hemiplegia and hemiparesis Hemiplegia - Right/Left: Left Hemiplegia - dominant/non-dominant: Non-dominant Hemiplegia - caused by: Cerebral infarction     Time: 8295-6213 PT Time Calculation (min) (ACUTE ONLY): 23 min  Charges:  $Gait Training: 8-22 mins $Therapeutic Activity: 8-22 mins                     Lala Lund, PT, SPT  11:56 AM,12/07/22

## 2022-12-08 DIAGNOSIS — I639 Cerebral infarction, unspecified: Secondary | ICD-10-CM | POA: Diagnosis not present

## 2022-12-08 LAB — RENAL FUNCTION PANEL
Albumin: 3.6 g/dL (ref 3.5–5.0)
Anion gap: 8 (ref 5–15)
BUN: 20 mg/dL (ref 6–20)
CO2: 24 mmol/L (ref 22–32)
Calcium: 8.4 mg/dL — ABNORMAL LOW (ref 8.9–10.3)
Chloride: 105 mmol/L (ref 98–111)
Creatinine, Ser: 0.64 mg/dL (ref 0.44–1.00)
GFR, Estimated: >60 mL/min (ref >60)
Glucose, Bld: 91 mg/dL (ref 70–99)
Phosphorus: 3.6 mg/dL (ref 2.5–4.6)
Potassium: 3.8 mmol/L (ref 3.5–5.1)
Sodium: 137 mmol/L (ref 135–145)

## 2022-12-08 LAB — CBC
HCT: 43.1 % (ref 36.0–46.0)
Hemoglobin: 13.5 g/dL (ref 12.0–15.0)
MCH: 26.9 pg (ref 26.0–34.0)
MCHC: 31.3 g/dL (ref 30.0–36.0)
MCV: 86 fL (ref 80.0–100.0)
Platelets: 217 10*3/uL (ref 150–400)
RBC: 5.01 MIL/uL (ref 3.87–5.11)
RDW: 14.5 % (ref 11.5–15.5)
WBC: 5.6 10*3/uL (ref 4.0–10.5)
nRBC: 0 % (ref 0.0–0.2)

## 2022-12-08 MED ORDER — DULOXETINE HCL 30 MG PO CPEP
30.0000 mg | ORAL_CAPSULE | Freq: Every day | ORAL | Status: DC
  Administered 2022-12-08 – 2022-12-09 (×2): 30 mg via ORAL
  Filled 2022-12-08 (×2): qty 1

## 2022-12-08 NOTE — Plan of Care (Signed)
  Problem: Education: Goal: Knowledge of disease or condition will improve Outcome: Progressing Goal: Knowledge of secondary prevention will improve (MUST DOCUMENT ALL) Outcome: Progressing Goal: Knowledge of patient specific risk factors will improve (Mark N/A or DELETE if not current risk factor) Outcome: Progressing   Problem: Ischemic Stroke/TIA Tissue Perfusion: Goal: Complications of ischemic stroke/TIA will be minimized Outcome: Progressing   Problem: Coping: Goal: Will verbalize positive feelings about self Outcome: Progressing Goal: Will identify appropriate support needs Outcome: Progressing   Problem: Health Behavior/Discharge Planning: Goal: Ability to manage health-related needs will improve Outcome: Progressing Goal: Goals will be collaboratively established with patient/family Outcome: Progressing   Problem: Self-Care: Goal: Ability to participate in self-care as condition permits will improve Outcome: Progressing Goal: Verbalization of feelings and concerns over difficulty with self-care will improve Outcome: Progressing Goal: Ability to communicate needs accurately will improve Outcome: Progressing   Problem: Nutrition: Goal: Risk of aspiration will decrease Outcome: Progressing Goal: Dietary intake will improve Outcome: Progressing   Problem: Education: Goal: Knowledge of General Education information will improve Description: Including pain rating scale, medication(s)/side effects and non-pharmacologic comfort measures Outcome: Progressing   Problem: Health Behavior/Discharge Planning: Goal: Ability to manage health-related needs will improve Outcome: Progressing   Problem: Clinical Measurements: Goal: Ability to maintain clinical measurements within normal limits will improve Outcome: Progressing Goal: Will remain free from infection Outcome: Progressing Goal: Diagnostic test results will improve Outcome: Progressing Goal: Respiratory  complications will improve Outcome: Progressing Goal: Cardiovascular complication will be avoided Outcome: Progressing   Problem: Activity: Goal: Risk for activity intolerance will decrease Outcome: Progressing   Problem: Nutrition: Goal: Adequate nutrition will be maintained Outcome: Progressing   Problem: Coping: Goal: Level of anxiety will decrease Outcome: Progressing   Problem: Elimination: Goal: Will not experience complications related to bowel motility Outcome: Progressing Goal: Will not experience complications related to urinary retention Outcome: Progressing   Problem: Pain Managment: Goal: General experience of comfort will improve Outcome: Progressing   Problem: Safety: Goal: Ability to remain free from injury will improve Outcome: Progressing   Problem: Skin Integrity: Goal: Risk for impaired skin integrity will decrease Outcome: Progressing   Problem: Education: Goal: Knowledge of disease or condition will improve Outcome: Progressing Goal: Knowledge of secondary prevention will improve (MUST DOCUMENT ALL) Outcome: Progressing Goal: Knowledge of patient specific risk factors will improve (Mark N/A or DELETE if not current risk factor) Outcome: Progressing   Problem: Ischemic Stroke/TIA Tissue Perfusion: Goal: Complications of ischemic stroke/TIA will be minimized Outcome: Progressing   Problem: Coping: Goal: Will verbalize positive feelings about self Outcome: Progressing Goal: Will identify appropriate support needs Outcome: Progressing   Problem: Health Behavior/Discharge Planning: Goal: Ability to manage health-related needs will improve Outcome: Progressing Goal: Goals will be collaboratively established with patient/family Outcome: Progressing   Problem: Self-Care: Goal: Ability to participate in self-care as condition permits will improve Outcome: Progressing Goal: Verbalization of feelings and concerns over difficulty with self-care  will improve Outcome: Progressing Goal: Ability to communicate needs accurately will improve Outcome: Progressing   Problem: Nutrition: Goal: Risk of aspiration will decrease Outcome: Progressing Goal: Dietary intake will improve Outcome: Progressing   

## 2022-12-08 NOTE — Progress Notes (Addendum)
PROGRESS NOTE    Bridget Maldonado  ZOX:[id number] DOB: [birth year] DOA: 12/04/2022 PCP: Karie Schwalbe, MD   Assessment & Plan:   Principal Problem:   Acute CVA (cerebrovascular accident) [facility]) Active Problems:   Essential hypertension   Obesity (BMI 30-39.9)   Hypokalemia  Assessment and Plan:  Acute/subacute CVA: of left parietal lobe. S/p TNK administration. Continue on statin, aspirin. Hypercoagulable work-up pending. PT/OT recs inpatient rehab. Echo is unremarkable. HbA1c 4.9. Pt is agreeable to inpatient rehab. CM is aware  HTN: continue on lisinopril. Hydralazine prn   Hypokalemia: WNL today    Obesity: BMI 38.5. Would benefit from weight loss      DVT prophylaxis: SCDs Code Status: full  Family Communication: discussed pt's care w/ pt's family at bedside and answered their questions  Disposition Plan: inpatient rehab  Level of care: Med-Surg  Status is: Inpatient Remains inpatient appropriate because: needs a bed at inpatient rehab     Consultants:  Neuro   Procedures:  Antimicrobials:    Subjective: Pt c/o malaise  Objective: Vitals:   12/07/22 1610 12/07/22 2116 12/08/22 0106 12/08/22 0437  BP: 136/88 (!) 147/102 117/78 125/78  Pulse: 86 78 87 68  Resp: 20  20 18   Temp: 98.4 F (36.9 C) 98.1 F (36.7 C) 97.8 F (36.6 C) 97.8 F (36.6 C)  TempSrc:      SpO2: 99% 98% 97% 97%  Weight:      Height:        Intake/Output Summary (Last 24 hours) at 12/08/2022 0710 Last data filed at 12/07/2022 1914 Gross per 24 hour  Intake 480 ml  Output --  Net 480 ml   Filed Weights   12/04/22 1341  Weight: 101.7 kg    Examination:  General exam: Appears comfortable  Respiratory system: clear breath sounds  Cardiovascular system: S1/S2+. No rubs or clicks Gastrointestinal system: Abd is soft, NT, ND & hypoactive bowel sounds Central nervous system: Alert & oriented Dysarthria  Psychiatry: Judgement and insight appears at baseline.  Appropriate mood and affect    Data Reviewed: I have personally reviewed following labs and imaging studies  CBC: Recent Labs  Lab 12/02/22 1723 12/04/22 1515 12/05/22 0522 12/06/22 0453 12/07/22 0443 12/08/22 0653  WBC 7.7 8.0 7.0 6.7 6.3 5.6  NEUTROABS 4.7 5.6 4.4  --   --   --   HGB 14.0 13.5 14.0 13.3 13.0 13.5  HCT 44.0 41.9 44.3 41.2 41.5 43.1  MCV 85.4 84.8 84.9 84.8 85.2 86.0  PLT 260 262 250 237 230 217   Basic Metabolic Panel: Recent Labs  Lab 12/02/22 1723 12/04/22 1515 12/05/22 0522 12/06/22 0453 12/07/22 0443  NA 137 139 137 136 137  K 3.6 3.3* 3.6 3.2* 3.6  CL 106 107 107 107 107  CO2 22 24 23 23 23   GLUCOSE 118* 131* 99 107* 97  BUN 18 14 13  23* 23*  CREATININE 0.71 0.77 0.60 0.66 0.66  CALCIUM 9.0 8.9 8.4* 8.5* 8.6*  MG  --   --  2.1  --   --   PHOS  --   --  3.5 3.3 4.2   GFR: Estimated Creatinine Clearance: 109.5 mL/min (by C-G formula based on SCr of 0.66 mg/dL). Liver Function Tests: Recent Labs  Lab 12/02/22 1723 12/04/22 1515 12/05/22 0522 12/06/22 0453 12/07/22 0443  AST 23 20 19   --   --   ALT 24 20 20   --   --   ALKPHOS 96 104 104  --   --  BILITOT 0.4 0.3 0.6  --   --   PROT 7.0 7.0 7.2  --   --   ALBUMIN 3.8 3.6 3.7 3.4* 3.4*   Recent Labs  Lab 12/02/22 1723  LIPASE 48   No results for input(s): "AMMONIA" in the last 168 hours. Coagulation Profile: Recent Labs  Lab 12/04/22 1515  INR 1.0   Cardiac Enzymes: Recent Labs  Lab 12/05/22 1054  CKTOTAL 48   BNP (last 3 results) No results for input(s): "PROBNP" in the last 8760 hours. HbA1C: No results for input(s): "HGBA1C" in the last 72 hours.  CBG: Recent Labs  Lab 12/04/22 1337 12/04/22 1559  GLUCAP 136* 128*   Lipid Profile: No results for input(s): "CHOL", "HDL", "LDLCALC", "TRIG", "CHOLHDL", "LDLDIRECT" in the last 72 hours.  Thyroid Function Tests: No results for input(s): "TSH", "T4TOTAL", "FREET4", "T3FREE", "THYROIDAB" in the last 72  hours. Anemia Panel: No results for input(s): "VITAMINB12", "FOLATE", "FERRITIN", "TIBC", "IRON", "RETICCTPCT" in the last 72 hours. Sepsis Labs: No results for input(s): "PROCALCITON", "LATICACIDVEN" in the last 168 hours.  Recent Results (from the past 240 hour(s))  MRSA Next Gen by PCR, Nasal     Status: None   Collection Time: 12/04/22  5:15 PM   Specimen: Nasal Mucosa; Nasal Swab  Result Value Ref Range Status   MRSA by PCR Next Gen NOT DETECTED NOT DETECTED Final    Comment: (NOTE) The GeneXpert MRSA Assay (FDA approved for NASAL specimens only), is one component of a comprehensive MRSA colonization surveillance program. It is not intended to diagnose MRSA infection nor to guide or monitor treatment for MRSA infections. Test performance is not FDA approved in patients less than 45 years old. Performed at [facility], [address] [place], [place] [address]          Radiology Studies: MR BRAIN W WO CONTRAST  Result Date: 12/06/2022 CLINICAL DATA:  Neuro deficit, acute stroke suspected. Question demyelinating disease. EXAM: MRI HEAD WITHOUT AND WITH CONTRAST TECHNIQUE: Multiplanar, multiecho pulse sequences of the brain and surrounding structures were obtained without and with intravenous contrast. CONTRAST:  10mL GADAVIST GADOBUTROL 1 MMOL/ML IV SOLN COMPARISON:  CT head and CT angio head and neck 12/05/2022. MR head and MRA head 12/04/2022 FINDINGS: Brain: Punctate focus in the scratched at punctate focus of restricted diffusion in the left parietal lobe is less conspicuous on today's study. T2 and FLAIR signal changes in the same location are again noted. Additional cortical and subcortical T2 hyperintensities are present in the left parietal lobe. No other significant white matter disease is present. Deep brain nuclei are within normal limits. The ventricles are of normal size. No significant extraaxial fluid collection is present. The brainstem and cerebellum are  within normal limits. The internal auditory canals are within normal limits. Midline structures are within normal limits. Vascular: Flow is present in the major intracranial arteries. Skull and upper cervical spine: The craniocervical junction is normal. Upper cervical spine is within normal limits. Marrow signal is unremarkable. Sinuses/Orbits: The paranasal sinuses and mastoid air cells are clear. The globes and orbits are within normal limits. Other: IMPRESSION: 1. Punctate focus of restricted diffusion in the left parietal lobe is less conspicuous on today's study. 2. Additional cortical and subcortical T2 hyperintensities in the high left parietal lobe are again noted, consistent remote infarcts in this area. 3. No other acute intracranial abnormality or significant interval change. Electronically Signed   By: Marin Roberts M.D.   On: 12/06/2022 17:54  Scheduled Meds:  aspirin  81 mg Oral Daily   atorvastatin  20 mg Oral Daily   labetalol  20 mg Intravenous Once   lisinopril  5 mg Oral Daily   pantoprazole  40 mg Oral QHS   sodium chloride flush  3 mL Intravenous Once   Continuous Infusions:   LOS: 4 days    Time spent: 25 mins     Charise Killian, MD [facility] Pager [phone]  If 7PM-7AM, please contact night-coverage [email or web address] 12/08/2022, 7:10 AM

## 2022-12-08 NOTE — Progress Notes (Signed)
Occupational Therapy Treatment Patient Details Name: Bridget Maldonado MRN: [id number] DOB: 07-21-1980 Today's Date: 12/08/2022   History of present illness Per chart, 42 year old female presenting with acute onset of left sided weakness and numbness with left facial droop. Initial exam revealed left sided deficits with NIHSS of 4. There was some question of functionality on exam, requiring MRI brain for further assessment. 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 TNK was administered and the patient was admitted for stroke work up.   OT comments  Upon entering the room, pt seated in recliner chair with mother present in room. Pt is motivated for therapeutic intervention. She does endorse some sciatic pain in back with mobility tasks and has been using ice for pain management this morning. Pt stands with supervision. She ambulates with min guard without use of AD to stand at sink for grooming tasks. Pt utilizing L UE into functional tasks with increased time and close supervision for safety while standing. Pt ambulates 100' without RW with  min guard initially pt wanting to hold onto guardrail but able to release and ambulates with min guard. Pt returns to room and OT educated and demonstrated opposition exercises( L UE),  rapid alternating movement exercises ( L UE), and bilateral coordination tasks for exercise program to address coordination deficits. 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    Recommendations for follow up therapy are one component of a multi-disciplinary discharge planning process, led by the attending physician.  Recommendations may be updated based on patient status, additional functional criteria and insurance authorization.    Assistance Recommended at Discharge Frequent or constant  Supervision/Assistance  Patient can return home with the following  Assistance with cooking/housework;A little help with walking and/or transfers;A little help with bathing/dressing/bathroom;Assist for transportation;Help with stairs or ramp for entrance   Equipment Recommendations  Other (comment) (defer to next venue of care)       Precautions / Restrictions Precautions Precautions: Fall Restrictions Weight Bearing Restrictions: No       Mobility Bed Mobility               General bed mobility comments: Pt seated in recliner chair at begining and end of session    Transfers Overall transfer level: Needs assistance Equipment used: None, 1 person hand held assist Transfers: Sit to/from Stand Sit to Stand: Min guard                 Balance Overall balance assessment: Needs assistance Sitting-balance support: Feet supported Sitting balance-Leahy Scale: Fair     Standing balance support: During functional activity, Single extremity supported Standing balance-Leahy Scale: Fair                             ADL either performed or assessed with clinical judgement   ADL Overall ADL's : Needs assistance/impaired     Grooming: Wash/dry face;Oral care;Min guard;Standing                                      Extremity/Trunk Assessment Upper Extremity Assessment Upper Extremity Assessment: LUE deficits/detail LUE Deficits / Details: mild ataxia in LUE when doing coordination testing, 3-/5 LUE throughout, compared to 4/5 RUE throughout.  Cognition Arousal/Alertness: Awake/alert Behavior During Therapy: WFL for tasks assessed/performed Overall Cognitive Status: Within Functional Limits for tasks assessed                                                General Comments  (Increased time needed to complete tasks, pt is highly motivated to return to functional baseline. Education provided on benefits of  Acute Rehab to regain independent function.)    Pertinent Vitals/ Pain       Pain Assessment Pain Assessment: 0-10 Pain Score: 5  Pain Location: sciatic pain Pain Descriptors / Indicators: Discomfort, Sharp Pain Intervention(s): Repositioned, Monitored during session         Frequency  Min 3X/week        Progress Toward Goals  OT Goals(current goals can now be found in the care plan section)  Progress towards OT goals: Progressing toward goals     Plan Discharge plan remains appropriate;Frequency remains appropriate       AM-PAC OT "6 Clicks" Daily Activity     Outcome Measure   Help from another person eating meals?: None Help from another person taking care of personal grooming?: A Little Help from another person toileting, which includes using toliet, bedpan, or urinal?: A Little Help from another person bathing (including washing, rinsing, drying)?: A Little Help from another person to put on and taking off regular upper body clothing?: A Little Help from another person to put on and taking off regular lower body clothing?: A Little 6 Click Score: 19    End of Session    OT Visit Diagnosis: Other abnormalities of gait and mobility (R26.89);Muscle weakness (generalized) (M62.81);Ataxia, unspecified (R27.0)   Activity Tolerance Patient tolerated treatment well   Patient Left in chair;with call bell/phone within reach;with family/visitor present   Nurse Communication Mobility status        Time: 2130-8657 OT Time Calculation (min): 23 min  Charges: OT General Charges $OT Visit: 1 Visit OT Treatments $Self Care/Home Management : 8-22 mins $Neuromuscular Re-education: 8-22 mins  Jackquline Denmark, MS, OTR/L , CBIS ascom [phone]  12/08/22, 1:50 PM

## 2022-12-08 NOTE — Progress Notes (Signed)
Physical Therapy Treatment Patient Details Name: Bridget Maldonado MRN: [id number] DOB: 1980-08-25 Today's Date: 12/08/2022   History of Present Illness Per chart, 42 year old female presenting with acute onset of left sided weakness and numbness with left facial droop. Initial exam revealed left sided deficits with NIHSS of 4. There was some question of functionality on exam, requiring MRI brain for further assessment. 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 TNK was administered and the patient was admitted for stroke work up.    PT Comments    Pt received in bed and motivated to work with PT. Discussed current level of function and limitations. Pt's speech remains dysarthric with voice stoppages/arrests. She is able to follow simple commands and comprehend conversations. She however does have decreased strength in L UE with reduced grip strength and motor control in Left hand. Upon sitting at EOB, Left hip demonstrates 2+/5, quads 3-/5 and ~2/5 gross ankle strength, also demonstrated during gait training with evident foot drop and decreased heel strike. Pt fatigued after 121ft with support of RW requiring standing rest break. At this time, pt is requiring skilled PT, OT, and ST services to assist with return to previous independent function. Will continue to see acutely until possible d/c to AIR. Pt has very supportive family and dispo.    Recommendations for follow up therapy are one component of a multi-disciplinary discharge planning process, led by the attending physician.  Recommendations may be updated based on patient status, additional functional criteria and insurance authorization.  Follow Up Recommendations       Assistance Recommended at Discharge Intermittent Supervision/Assistance  Patient can return home with the following A little help with walking and/or transfers;A  lot of help with bathing/dressing/bathroom;Assistance with cooking/housework;Assist for transportation;Help with stairs or ramp for entrance   Equipment Recommendations  Other (comment) (TBD at next facility)    Recommendations for Other Services       Precautions / Restrictions Precautions Precautions: Fall Restrictions Weight Bearing Restrictions: No     Mobility  Bed Mobility Overal bed mobility: Needs Assistance Bed Mobility: Supine to Sit     Supine to sit: Supervision, HOB elevated     General bed mobility comments:  (MinA needed to transfer to Right side of bed)    Transfers Overall transfer level: Needs assistance Equipment used: Rolling walker (2 wheels) Transfers: Sit to/from Stand Sit to Stand: Min guard           General transfer comment:  (CGA upon standing to gain initial balance)    Ambulation/Gait Ambulation/Gait assistance: Min guard Gait Distance (Feet): 100 Feet Assistive device: Rolling walker (2 wheels) Gait Pattern/deviations: Step-through pattern, Decreased step length - left, Drifts right/left (slightly ataxic) Gait velocity: decreased     General Gait Details: decreased L heel strike with slight hip hike noted. Quick to fatigue on Left LE   Stairs Stairs:  (Pt has 5 steps to enter)           Wheelchair Mobility    Modified Rankin (Stroke Patients Only)       Balance Overall balance assessment: Needs assistance Sitting-balance support: Feet supported Sitting balance-Leahy Scale: Fair     Standing balance support: During functional activity, Single extremity supported Standing balance-Leahy Scale: Fair Standing balance comment:  (High fall risk walking in hall without AD due to decreased motor control and foot drop L LE)  Cognition Arousal/Alertness: Awake/alert Behavior During Therapy: WFL for tasks assessed/performed Overall Cognitive Status: Within Functional Limits for tasks  assessed                                 General Comments:  (Very cooperative and motivated)        Exercises General Exercises - Lower Extremity Ankle Circles/Pumps: AROM, Right, AAROM, Left, 10 reps, Seated Long Arc Quad: AROM, Right, AAROM, Left, 10 reps, Seated Hip Flexion/Marching: AROM, Right, PROM, Left, 10 reps, Seated    General Comments General comments (skin integrity, edema, etc.):  (Increased time needed to complete tasks, pt is highly motivated to return to functional baseline. Education provided on benefits of Acute Rehab to regain independent function.)      Pertinent Vitals/Pain Pain Assessment Pain Assessment: 0-10 Pain Score: 3  Pain Location:  (Sciatic pain) Pain Descriptors / Indicators: Discomfort, Sharp Pain Intervention(s): Limited activity within patient's tolerance    Home Living                          Prior Function            PT Goals (current goals can now be found in the care plan section) Acute Rehab PT Goals Patient Stated Goal: to return to home    Frequency    Min 4X/week      PT Plan Discharge plan needs to be updated    Co-evaluation              AM-PAC PT "6 Clicks" Mobility   Outcome Measure  Help needed turning from your back to your side while in a flat bed without using bedrails?: None Help needed moving from lying on your back to sitting on the side of a flat bed without using bedrails?: None Help needed moving to and from a bed to a chair (including a wheelchair)?: A Little Help needed standing up from a chair using your arms (e.g., wheelchair or bedside chair)?: A Little Help needed to walk in hospital room?: A Little Help needed climbing 3-5 steps with a railing? : A Lot 6 Click Score: 19    End of Session Equipment Utilized During Treatment: Gait belt Activity Tolerance: Patient tolerated treatment well Patient left: in chair;with call bell/phone within reach;with chair alarm  set;with family/visitor present Nurse Communication: Mobility status PT Visit Diagnosis: Ataxic gait (R26.0);Unsteadiness on feet (R26.81);Hemiplegia and hemiparesis Hemiplegia - Right/Left: Left Hemiplegia - dominant/non-dominant: Non-dominant Hemiplegia - caused by: Cerebral infarction     Time: 1033-1100 PT Time Calculation (min) (ACUTE ONLY): 27 min  Charges:  $Gait Training: 8-22 mins $Therapeutic Exercise: 8-22 mins                    Zadie Cleverly, PTA  Jannet Askew 12/08/2022, 12:12 PM

## 2022-12-08 NOTE — Progress Notes (Signed)
Inpatient Rehab Admissions Coordinator:    Per updated PT recommendations and OT recommendations pt was screened for CIR by Estill Dooms, PT, DPT.  I will place an order for CIR consult per our protocol.  Do note that pt mobilizing at min guard or better, and with Monia Pouch, will require prior auth, which may take up to 3 days.  Pt will likely continue to improve during acute stay and may not require a CIR stay.    Estill Dooms, PT, DPT Admissions Coordinator [phone] 12/08/22  2:56 PM

## 2022-12-09 DIAGNOSIS — I639 Cerebral infarction, unspecified: Secondary | ICD-10-CM | POA: Diagnosis not present

## 2022-12-09 LAB — RENAL FUNCTION PANEL
Albumin: 3.5 g/dL (ref 3.5–5.0)
Anion gap: 8 (ref 5–15)
BUN: 22 mg/dL — ABNORMAL HIGH (ref 6–20)
CO2: 24 mmol/L (ref 22–32)
Calcium: 8.5 mg/dL — ABNORMAL LOW (ref 8.9–10.3)
Chloride: 104 mmol/L (ref 98–111)
Creatinine, Ser: 0.66 mg/dL (ref 0.44–1.00)
GFR, Estimated: >60 mL/min (ref >60)
Glucose, Bld: 90 mg/dL (ref 70–99)
Phosphorus: 4 mg/dL (ref 2.5–4.6)
Potassium: 3.8 mmol/L (ref 3.5–5.1)
Sodium: 136 mmol/L (ref 135–145)

## 2022-12-09 LAB — CBC
HCT: 41.1 % (ref 36.0–46.0)
Hemoglobin: 13.3 g/dL (ref 12.0–15.0)
MCH: 27.3 pg (ref 26.0–34.0)
MCHC: 32.4 g/dL (ref 30.0–36.0)
MCV: 84.4 fL (ref 80.0–100.0)
Platelets: 238 10*3/uL (ref 150–400)
RBC: 4.87 MIL/uL (ref 3.87–5.11)
RDW: 14.2 % (ref 11.5–15.5)
WBC: 5.9 10*3/uL (ref 4.0–10.5)
nRBC: 0 % (ref 0.0–0.2)

## 2022-12-09 MED ORDER — ASPIRIN 81 MG PO CHEW: 81.0000 mg | CHEWABLE_TABLET | Freq: Every day | ORAL | 0 refills | Status: AC

## 2022-12-09 MED ORDER — LISINOPRIL 5 MG PO TABS: 5.0000 mg | ORAL_TABLET | Freq: Every day | ORAL | 0 refills | Status: DC

## 2022-12-09 MED ORDER — ATORVASTATIN CALCIUM 20 MG PO TABS: 20.0000 mg | ORAL_TABLET | Freq: Every day | ORAL | 0 refills | Status: DC

## 2022-12-09 NOTE — TOC Transition Note (Signed)
Transition of Care [facility]) - CM/SW Discharge Note   Patient Details  Name: Bridget Maldonado MRN: [id number] Date of Birth: 18-Sep-1980  [facility] CM/SW Contact:  Allena Katz, LCSW Phone Number: 12/09/2022, 11:31 AM   Clinical Narrative:   CSW spoke with pt and her mom about therapy feeling she can discharge home with [facility] vs outpatient. Both patient and mom would like referral sent to [facility] outpatient. Referral sent. Mom reports no DME needs. CSW signing off.     Final next level of care: OP Rehab Barriers to Discharge: Barriers Resolved   Patient Goals and CMS Choice CMS Medicare.gov Compare Post Acute Care list provided to:: Patient Choice offered to / list presented to : Patient  Discharge Placement                  Patient to be transferred to facility by: Mom Name of family member notified: Mom Patient and family notified of of transfer: 12/09/22  Discharge Plan and Services Additional resources added to the After Visit Summary for                                       Social Determinants of Health (SDOH) Interventions SDOH Screenings   Depression (PHQ2-9): Medium Risk (09/17/2022)  Tobacco Use: Low Risk  (12/02/2022)     Readmission Risk Interventions     No data to display

## 2022-12-09 NOTE — Progress Notes (Signed)
[facility] rehab admissions - I spoke with patient and her mom by phone.  Per mom, patient ambulated around the nurses station with no device with some unsteadiness.  Patient is already at [facility] assist walking 100' yesterday.  After discussion with mom and patient, help will be available to patient at home.  Likely home with [facility] or outpatient therapy would be most appropriate.  Recommend either HH or outpatient therapy depending on patient/mom preference.  Call me for questions.  [phone]

## 2022-12-09 NOTE — Discharge Summary (Signed)
Physician Discharge Summary  Bridget Maldonado:[id number] DOB: 20-Apr-1981 DOA: 12/04/2022  PCP: Karie Schwalbe, MD  Admit date: 12/04/2022 Discharge date: 12/09/2022  Admitted From: home  Disposition:  home   Recommendations for Outpatient Follow-up:  Follow up with PCP in 1-2 weeks F/u w/ neuro in 2-3 weeks  Home Health: Equipment/Devices:  Discharge Condition: stable  CODE STATUS: full  Diet recommendation: Heart Healthy   Brief/Interim Summary:   HPI was taken from NP Delton See: This is a 42 yo female who presented to [facility] ER on 06/13 with c/o left sided arm and facial numbness along with slurred speech.  Prior to symptoms pt reports she had a 7/10 bifrontal nonthrobbing headache without visual changes or nausea.  Her last known well time was 1300 on 06/13.     ED Course  Upon arrival to the ER code stroke initiated.  CT Head negative for acute intracranial hemorrhage or evidence of evolving large vessel territory infarct, however ASPECT score 10.  Per neurologist Dr. Otelia Limes due to pt exam inconsistencies (see consult note) held off on decision making regarding TNK until he reviewed stat MRI Brain/MRA Head results.  MRI Brain revealed punctate focus of restricted diffusion in the left parietal lobe consistent with acute/subacute infarct.  Therefore, TNK ordered per neurology recommendations.  However, pt hypertensive sbp 150 to 170's and dbp 100's.  Per neurology recommendations antihypertensive medications administered for better bp control prior to administration of TNK.  Pt reports she was previously on po metoprolol for bp management, however this was discontinued by her PCP in March 2024 because her bp was controlled at that time.  However, she states she feels like the metoprolol did not give her adequate bp control when she was taking it.  PCCM team contacted for ICU admission post TNK.     MRI Brain/MRA Head: Punctate focus of restricted diffusion in the left parietal lobe,  consistent with acute/subacute infarct. Multiple small T2 hyperintense foci in the left parietal lobe, adjacent to the focus of restricted diffusion, suggestive of prior small infarcts. No high-grade flow-limiting stenosis or proximal branch occlusion of the intracranial circulation.   As per Dr. Clide Dales: Bridget Maldonado is a 42 yo female with past medical history significant for obesity, anxiety, bipolar disorder, chronic headaches, asthma presented to [facility] ER on 06/13 with left sided arm and facial numbness along with slurred speech. Prior to symptoms pt reports she had a 7/10 bifrontal nonthrobbing headache without visual changes or nausea. Her last known well time was 1300 on 06/13.  Initial CT head unremarkable, MRI brain revealed punctate focus of restricted diffusion in the left parietal lobe consistent with acute/subacute infarct.  TNK given after blood pressure control.  Patient is admitted to ICU post streptokinase, CTA head and neck no high-grade flow limiting stenosis.  Neurology team involved advised full stroke workup.    As per Dr. Mayford Knife 6/16-6/18/24: Medically stable and awaiting inpatient rehab placement. CIR evaluated the pt and recommend HH instead.Marland Kitchen    Discharge Diagnoses:  Principal Problem:   Acute CVA (cerebrovascular accident) [facility]) Active Problems:   Essential hypertension   Obesity (BMI 30-39.9)   Hypokalemia  Acute/subacute CVA: of left parietal lobe. S/p TNK administration. Continue on statin, aspirin. Hypercoagulable work-up pending. PT/OT recs inpatient rehab. Echo is unremarkable. HbA1c 4.9.   HTN: continue on lisinopril. Hydralazine prn   Hypokalemia: WNL today    Obesity: BMI 38.5. Would benefit from weight loss   Discharge Instructions  Discharge Instructions  Diet - low sodium heart healthy   Complete by: As directed    Discharge instructions   Complete by: As directed    F/u w/ PCP in 1-2 weeks. F/u w/ neuro in 2-3 weeks.   Increase activity  slowly   Complete by: As directed       Allergies as of 12/09/2022       Reactions   Sulfa Antibiotics Nausea And Vomiting   Cipro [ciprofloxacin Hcl] Nausea And Vomiting   Effexor [venlafaxine Hcl] Other (See Comments)   Excessive sleeping, slept for 2 days        Medication List     TAKE these medications    albuterol 108 (90 Base) MCG/ACT inhaler Commonly known as: VENTOLIN HFA Inhale 2 puffs into the lungs every 6 (six) hours as needed for wheezing or shortness of breath.   ALPRAZolam 0.25 MG tablet Commonly known as: XANAX Take 1 tablet (0.25 mg total) by mouth 2 (two) times daily as needed for anxiety.   aspirin 81 MG chewable tablet Chew 1 tablet (81 mg total) by mouth daily. Start taking on: December 10, 2022   atorvastatin 20 MG tablet Commonly known as: LIPITOR Take 1 tablet (20 mg total) by mouth daily. Start taking on: December 10, 2022   DULoxetine 30 MG capsule Commonly known as: CYMBALTA Take 1 capsule (30 mg total) by mouth daily.   lisinopril 5 MG tablet Commonly known as: ZESTRIL Take 1 tablet (5 mg total) by mouth daily. Start taking on: December 10, 2022   mometasone 0.1 % ointment Commonly known as: ELOCON APPLY TWICE DAILY AS NEEDED FOR PSORIASIS   montelukast 10 MG tablet Commonly known as: SINGULAIR Take 1 tablet (10 mg total) by mouth at bedtime.   traMADol 50 MG tablet Commonly known as: ULTRAM Take 1 tablet (50 mg total) by mouth 2 (two) times daily as needed.        Allergies  Allergen Reactions   Sulfa Antibiotics Nausea And Vomiting   Cipro [Ciprofloxacin Hcl] Nausea And Vomiting   Effexor [Venlafaxine Hcl] Other (See Comments)    Excessive sleeping, slept for 2 days    Consultations: Neuro    Procedures/Studies: MR BRAIN W WO CONTRAST  Result Date: 12/06/2022 CLINICAL DATA:  Neuro deficit, acute stroke suspected. Question demyelinating disease. EXAM: MRI HEAD WITHOUT AND WITH CONTRAST TECHNIQUE: Multiplanar, multiecho  pulse sequences of the brain and surrounding structures were obtained without and with intravenous contrast. CONTRAST:  10mL GADAVIST GADOBUTROL 1 MMOL/ML IV SOLN COMPARISON:  CT head and CT angio head and neck 12/05/2022. MR head and MRA head 12/04/2022 FINDINGS: Brain: Punctate focus in the scratched at punctate focus of restricted diffusion in the left parietal lobe is less conspicuous on today's study. T2 and FLAIR signal changes in the same location are again noted. Additional cortical and subcortical T2 hyperintensities are present in the left parietal lobe. No other significant white matter disease is present. Deep brain nuclei are within normal limits. The ventricles are of normal size. No significant extraaxial fluid collection is present. The brainstem and cerebellum are within normal limits. The internal auditory canals are within normal limits. Midline structures are within normal limits. Vascular: Flow is present in the major intracranial arteries. Skull and upper cervical spine: The craniocervical junction is normal. Upper cervical spine is within normal limits. Marrow signal is unremarkable. Sinuses/Orbits: The paranasal sinuses and mastoid air cells are clear. The globes and orbits are within normal limits. Other: IMPRESSION: 1. Punctate focus  of restricted diffusion in the left parietal lobe is less conspicuous on today's study. 2. Additional cortical and subcortical T2 hyperintensities in the high left parietal lobe are again noted, consistent remote infarcts in this area. 3. No other acute intracranial abnormality or significant interval change. Electronically Signed   By: Marin Roberts M.D.   On: 12/06/2022 17:54   CT HEAD WO CONTRAST ( )  Result Date: 12/05/2022 CLINICAL DATA:  Recent infarct seen in previous MRI EXAM: CT HEAD WITHOUT CONTRAST TECHNIQUE: Contiguous axial images were obtained from the base of the skull through the vertex without intravenous contrast. RADIATION DOSE  REDUCTION: This exam was performed according to the departmental dose-optimization program which includes automated exposure control, adjustment of the mA and/or kV according to patient size and/or use of iterative reconstruction technique. COMPARISON:  Previous MR brain done on 12/04/2022 and CT brain done earlier today FINDINGS: Brain: No acute intracranial findings are seen. Punctate focus of restricted diffusion seen in left parietal cortex and previous MRI could not be localized in the CT images. Ventricles are not dilated. There are no signs of bleeding within the cranium. There is no focal edema or mass effect. Vascular: Unremarkable. Skull: Unremarkable. Sinuses/Orbits: No acute findings are seen. Other: No significant interval changes are noted in comparison with the study done earlier today IMPRESSION: No acute findings are seen in noncontrast CT brain. No significant interval changes are noted in comparison with the immediate previous CT brain done earlier today. Electronically Signed   By: Ernie Avena M.D.   On: 12/05/2022 17:52   ECHOCARDIOGRAM COMPLETE  Result Date: 12/05/2022    ECHOCARDIOGRAM REPORT   Patient Name:   LOGAN SICILIANO Date of Exam: 12/05/2022 Medical Rec #:  [id number]        Height:       64.0 in Accession #:    [id number]       Weight:       224.3 lb Date of Birth:  23-Sep-1980        BSA:          2.054 m Patient Age:    41 years         BP:           144/97 mmHg Patient Gender: F                HR:           58 bpm. Exam Location:  [facility] Procedure: 2D Echo, Cardiac Doppler and Color Doppler Indications:     Stroke I63.9  History:         Patient has no prior history of Echocardiogram examinations.                  Risk Factors:Hypertension. Migraines.  Sonographer:     Cristela Blue Referring Phys:  [id number] Ezequiel Essex Diagnosing Phys: Julien Nordmann MD  Sonographer Comments: Suboptimal parasternal window. IMPRESSIONS  1. Left ventricular ejection fraction, by estimation,  is 60 to 65%. The left ventricle has normal function. The left ventricle has no regional wall motion abnormalities. Left ventricular diastolic parameters were normal.  2. Right ventricular systolic function is normal. The right ventricular size is normal. There is normal pulmonary artery systolic pressure. The estimated right ventricular systolic pressure is 13.3 mmHg.  3. The mitral valve is normal in structure. No evidence of mitral valve regurgitation. No evidence of mitral stenosis.  4. The aortic valve is normal in structure. Aortic valve regurgitation is  not visualized. No aortic stenosis is present.  5. The inferior vena cava is normal in size with greater than 50% respiratory variability, suggesting right atrial pressure of 3 mmHg. FINDINGS  Left Ventricle: Left ventricular ejection fraction, by estimation, is 60 to 65%. The left ventricle has normal function. The left ventricle has no regional wall motion abnormalities. The left ventricular internal cavity size was normal in size. There is  no left ventricular hypertrophy. Left ventricular diastolic parameters were normal. Right Ventricle: The right ventricular size is normal. No increase in right ventricular wall thickness. Right ventricular systolic function is normal. There is normal pulmonary artery systolic pressure. The tricuspid regurgitant velocity is 1.44 m/s, and  with an assumed right atrial pressure of 5 mmHg, the estimated right ventricular systolic pressure is 13.3 mmHg. Left Atrium: Left atrial size was normal in size. Right Atrium: Right atrial size was normal in size. Pericardium: There is no evidence of pericardial effusion. Mitral Valve: The mitral valve is normal in structure. No evidence of mitral valve regurgitation. No evidence of mitral valve stenosis. Tricuspid Valve: The tricuspid valve is normal in structure. Tricuspid valve regurgitation is not demonstrated. No evidence of tricuspid stenosis. Aortic Valve: The aortic valve is  normal in structure. Aortic valve regurgitation is not visualized. No aortic stenosis is present. Aortic valve mean gradient measures 3.0 mmHg. Aortic valve peak gradient measures 5.8 mmHg. Aortic valve area, by VTI measures 2.43 cm. Pulmonic Valve: The pulmonic valve was normal in structure. Pulmonic valve regurgitation is not visualized. No evidence of pulmonic stenosis. Aorta: The aortic root is normal in size and structure. Venous: The inferior vena cava is normal in size with greater than 50% respiratory variability, suggesting right atrial pressure of 3 mmHg. IAS/Shunts: The interatrial septum appears to be lipomatous. No atrial level shunt detected by color flow Doppler.  LEFT VENTRICLE PLAX 2D LVIDd:         4.60 cm   Diastology LVIDs:         2.90 cm   LV e' medial:    9.79 cm/s LV PW:         1.30 cm   LV E/e' medial:  6.6 LV IVS:        1.10 cm   LV e' lateral:   9.25 cm/s LVOT diam:     2.00 cm   LV E/e' lateral: 7.0 LV SV:         50 LV SV Index:   24 LVOT Area:     3.14 cm  RIGHT VENTRICLE RV Basal diam:  2.80 cm RV Mid diam:    2.80 cm LEFT ATRIUM             Index       RIGHT ATRIUM           Index LA diam:        4.10 cm 2.00 cm/m  RA Area:     11.10 cm LA Vol (A2C):   18.6 ml 9.06 ml/m  RA Volume:   23.70 ml  11.54 ml/m LA Vol (A4C):   20.0 ml 9.74 ml/m LA Biplane Vol: 20.4 ml 9.93 ml/m  AORTIC VALVE AV Area (Vmax):    2.15 cm AV Area (Vmean):   2.45 cm AV Area (VTI):     2.43 cm AV Vmax:           120.00 cm/s AV Vmean:          84.900 cm/s AV VTI:  0.204 m AV Peak Grad:      5.8 mmHg AV Mean Grad:      3.0 mmHg LVOT Vmax:         82.20 cm/s LVOT Vmean:        66.100 cm/s LVOT VTI:          0.158 m LVOT/AV VTI ratio: 0.77  AORTA Ao Root diam: 2.60 cm MITRAL VALVE               TRICUSPID VALVE MV Area (PHT): 3.24 cm    TR Peak grad:   8.3 mmHg MV Decel Time: 234 msec    TR Vmax:        144.00 cm/s MV E velocity: 65.00 cm/s MV A velocity: 55.50 cm/s  SHUNTS MV E/A ratio:  1.17         Systemic VTI:  0.16 m                            Systemic Diam: 2.00 cm Julien Nordmann MD Electronically signed by Julien Nordmann MD Signature Date/Time: 12/05/2022/5:04:02 PM    Final    CT ANGIO HEAD NECK W WO CM  Result Date: 12/05/2022 CLINICAL DATA:  Stroke, follow-up. EXAM: CT ANGIOGRAPHY HEAD AND NECK WITH AND WITHOUT CONTRAST TECHNIQUE: Multidetector CT imaging of the head and neck was performed using the standard protocol during bolus administration of intravenous contrast. Multiplanar CT image reconstructions and MIPs were obtained to evaluate the vascular anatomy. Carotid stenosis measurements (when applicable) are obtained utilizing NASCET criteria, using the distal internal carotid diameter as the denominator. RADIATION DOSE REDUCTION: This exam was performed according to the departmental dose-optimization program which includes automated exposure control, adjustment of the mA and/or kV according to patient size and/or use of iterative reconstruction technique. CONTRAST:  75mL OMNIPAQUE IOHEXOL 350 MG/ML SOLN COMPARISON:  MR angiogram December 04, 2022. FINDINGS: CTA NECK FINDINGS Aortic arch: Imaged portion shows no evidence of aneurysm or dissection. No significant stenosis of the major arch vessel origins. Right carotid system: No evidence of dissection, stenosis (50% or greater), or occlusion. Left carotid system: No evidence of dissection, stenosis (50% or greater), or occlusion. Vertebral arteries: Codominant. No evidence of dissection, stenosis (50% or greater), or occlusion. Skeleton: Negative. Other neck: Negative. Upper chest: Negative. Review of the MIP images confirms the above findings CTA HEAD FINDINGS Anterior circulation: No significant stenosis, proximal occlusion, aneurysm, or vascular malformation. Posterior circulation: No significant stenosis, proximal occlusion, aneurysm, or vascular malformation. Venous sinuses: As permitted by contrast timing, patent. Anatomic variants: None  significant. Review of the MIP images confirms the above findings IMPRESSION: 1. No intracranial large vessel occlusion, significant stenosis, aneurysm, or vascular malformation. 2. No significant stenosis of the major neck arteries. Electronically Signed   By: Baldemar Lenis M.D.   On: 12/05/2022 11:58   CT HEAD WO CONTRAST ( )  Result Date: 12/05/2022 CLINICAL DATA:  Altered mental status, unknown cause EXAM: CT HEAD WITHOUT CONTRAST TECHNIQUE: Contiguous axial images were obtained from the base of the skull through the vertex without intravenous contrast. RADIATION DOSE REDUCTION: This exam was performed according to the departmental dose-optimization program which includes automated exposure control, adjustment of the mA and/or kV according to patient size and/or use of iterative reconstruction technique. COMPARISON:  12/04/2022 CT head and MRI head FINDINGS: Brain: The small focus of restricted diffusion noted on the 12/04/2022 MRI does not have a CT correlate. No evidence  of additional acute infarction, hemorrhage, mass, mass effect, or midline shift. No hydrocephalus or extra-axial fluid collection. Vascular: No hyperdense vessel. Skull: Negative for fracture or focal lesion. Sinuses/Orbits: No acute finding. Other: The mastoid air cells are well aerated. IMPRESSION: No acute intracranial process. The small focus of restricted diffusion noted on the 12/04/2022 MRI does not have a CT correlate. Electronically Signed   By: Wiliam Ke M.D.   On: 12/05/2022 02:28   MR BRAIN WO CONTRAST  Result Date: 12/04/2022 CLINICAL DATA:  Neuro deficit, acute, stroke suspected. EXAM: MRI HEAD WITHOUT CONTRAST MRA HEAD WITHOUT CONTRAST TECHNIQUE: Multiplanar, multi-echo pulse sequences of the brain and surrounding structures were acquired without intravenous contrast. Angiographic images of the Circle of Willis were acquired using MRA technique without intravenous contrast. COMPARISON:  Head CT December 04, 2022. FINDINGS: MRI HEAD FINDINGS Brain: Punctate focus of restricted diffusion is seen in the left parietal lobe (series 5, image 42). Multiple small T2 hyperintense foci are seen in the left parietal lobe, adjacent to the focus of restricted diffusion, suggestive of prior small infarcts. No definite focus of restricted diffusion in the right hemisphere. No hemorrhage, hydrocephalus, extra-axial collection or mass lesion. Vascular: Normal flow voids. Skull and upper cervical spine: Normal marrow signal. Sinuses/Orbits: No acute or significant finding. Other: None. MRA HEAD FINDINGS Anterior circulation: The visualized portions of the distal cervical and intracranial internal carotid arteries are widely patent with normal flow related enhancement. The bilateral anterior cerebral arteries and middle cerebral arteries are widely patent with anterograde flow without high-grade flow-limiting stenosis or proximal branch occlusion. No intracranial aneurysm within the anterior circulation. Posterior circulation: The vertebral arteries are widely patent with anterograde flow. Vertebrobasilar junction and basilar artery are widely patent with anterograde flow without evidence of basilar stenosis or aneurysm. Posterior cerebral arteries are normal bilaterally. No intracranial aneurysm within the posterior circulation. Anatomic variants: None significant. IMPRESSION: 1. Punctate focus of restricted diffusion in the left parietal lobe, consistent with acute/subacute infarct. 2. Multiple small T2 hyperintense foci in the left parietal lobe, adjacent to the focus of restricted diffusion, suggestive of prior small infarcts. 3. No high-grade flow-limiting stenosis or proximal branch occlusion of the intracranial circulation. Electronically Signed   By: Baldemar Lenis M.D.   On: 12/04/2022 15:03   MR ANGIO HEAD WO CONTRAST  Result Date: 12/04/2022 CLINICAL DATA:  Neuro deficit, acute, stroke suspected. EXAM:  MRI HEAD WITHOUT CONTRAST MRA HEAD WITHOUT CONTRAST TECHNIQUE: Multiplanar, multi-echo pulse sequences of the brain and surrounding structures were acquired without intravenous contrast. Angiographic images of the Circle of Willis were acquired using MRA technique without intravenous contrast. COMPARISON:  Head CT December 04, 2022. FINDINGS: MRI HEAD FINDINGS Brain: Punctate focus of restricted diffusion is seen in the left parietal lobe (series 5, image 42). Multiple small T2 hyperintense foci are seen in the left parietal lobe, adjacent to the focus of restricted diffusion, suggestive of prior small infarcts. No definite focus of restricted diffusion in the right hemisphere. No hemorrhage, hydrocephalus, extra-axial collection or mass lesion. Vascular: Normal flow voids. Skull and upper cervical spine: Normal marrow signal. Sinuses/Orbits: No acute or significant finding. Other: None. MRA HEAD FINDINGS Anterior circulation: The visualized portions of the distal cervical and intracranial internal carotid arteries are widely patent with normal flow related enhancement. The bilateral anterior cerebral arteries and middle cerebral arteries are widely patent with anterograde flow without high-grade flow-limiting stenosis or proximal branch occlusion. No intracranial aneurysm within the anterior circulation. Posterior  circulation: The vertebral arteries are widely patent with anterograde flow. Vertebrobasilar junction and basilar artery are widely patent with anterograde flow without evidence of basilar stenosis or aneurysm. Posterior cerebral arteries are normal bilaterally. No intracranial aneurysm within the posterior circulation. Anatomic variants: None significant. IMPRESSION: 1. Punctate focus of restricted diffusion in the left parietal lobe, consistent with acute/subacute infarct. 2. Multiple small T2 hyperintense foci in the left parietal lobe, adjacent to the focus of restricted diffusion, suggestive of prior  small infarcts. 3. No high-grade flow-limiting stenosis or proximal branch occlusion of the intracranial circulation. Electronically Signed   By: Baldemar Lenis M.D.   On: 12/04/2022 15:03   CT HEAD CODE STROKE WO CONTRAST  Result Date: 12/04/2022 CLINICAL DATA:  Code stroke. Neuro deficit, acute, stroke suspected. Left-sided numbness and slurred speech. EXAM: CT HEAD WITHOUT CONTRAST TECHNIQUE: Contiguous axial images were obtained from the base of the skull through the vertex without intravenous contrast. RADIATION DOSE REDUCTION: This exam was performed according to the departmental dose-optimization program which includes automated exposure control, adjustment of the mA and/or kV according to patient size and/or use of iterative reconstruction technique. COMPARISON:  None Available. FINDINGS: Brain: No acute intracranial hemorrhage. Gray-white differentiation is preserved. No hydrocephalus or extra-axial collection. No mass effect or midline shift. Vascular: No hyperdense vessel or unexpected calcification. Prominent arachnoid granulation in the right transverse sinus. Skull: No calvarial fracture or suspicious bone lesion. Skull base is unremarkable. Sinuses/Orbits: Unremarkable. Other: None. ASPECTS ([place] Stroke Program Early CT Score) - Ganglionic level infarction (caudate, lentiform nuclei, internal capsule, insula, M1-M3 cortex): 7 - Supraganglionic infarction (M4-M6 cortex): 3 Total score (0-10 with 10 being normal): 10 IMPRESSION: No acute intracranial hemorrhage or evidence of evolving large vessel territory infarct. ASPECT score is 10. Code stroke imaging results were communicated on 12/04/2022 at 1:50 pm to provider Dr. Otelia Limes via secure text paging. Electronically Signed   By: Orvan Falconer M.D.   On: 12/04/2022 13:52   CT ABDOMEN PELVIS W CONTRAST  Result Date: 12/02/2022 CLINICAL DATA:  Left lower quadrant pain EXAM: CT ABDOMEN AND PELVIS WITH CONTRAST TECHNIQUE:  Multidetector CT imaging of the abdomen and pelvis was performed using the standard protocol following bolus administration of intravenous contrast. RADIATION DOSE REDUCTION: This exam was performed according to the departmental dose-optimization program which includes automated exposure control, adjustment of the mA and/or kV according to patient size and/or use of iterative reconstruction technique. CONTRAST:  OMNIPAQUE IOHEXOL 300 MG/ML  SOLN COMPARISON:  03/24/2019 FINDINGS: Lower chest: No acute abnormality Hepatobiliary: No focal liver abnormality is seen. Status post cholecystectomy. No biliary dilatation. Pancreas: No focal abnormality or ductal dilatation. Spleen: No focal abnormality.  Normal size. Adrenals/Urinary Tract: No adrenal abnormality. No focal renal abnormality. No stones or hydronephrosis. Urinary bladder is unremarkable. Stomach/Bowel: Stomach, large and small bowel grossly unremarkable. Vascular/Lymphatic: No evidence of aneurysm or adenopathy. Reproductive: Prior hysterectomy.  No adnexal masses. Other: No free fluid or free air. Musculoskeletal: No acute bony abnormality. IMPRESSION: No acute findings. Electronically Signed   By: Charlett Nose M.D.   On: 12/02/2022 20:04   (Echo, Carotid, EGD, Colonoscopy, ERCP)    Subjective: Pt c/o fatigue    Discharge Exam: Vitals:   12/09/22 0431 12/09/22 0745  BP: 123/86 136/86  Pulse: 77 77  Resp: 18 17  Temp: 97.7 F (36.5 C) 97.9 F (36.6 C)  SpO2: 98% 97%   Vitals:   12/04/22 1341 12/09/22 0012 12/09/22 0431 12/09/22 0745  BP:  Marland Kitchen)  131/94 123/86 136/86  Pulse:  87 77 77  Resp:  18 18 17   Temp:  97.9 F (36.6 C) 97.7 F (36.5 C) 97.9 F (36.6 C)  TempSrc:  Oral Oral Oral  SpO2:  95% 98% 97%  Weight: 101.7 kg     Height:        General: Pt is alert, awake, not in acute distress Cardiovascular: S1/S2 +, no rubs, no gallops Respiratory: CTA bilaterally, no wheezing, no rhonchi Abdominal: Soft, NT, obese, bowel  sounds + Extremities: no edema, no cyanosis    The results of significant diagnostics from this hospitalization (including imaging, microbiology, ancillary and laboratory) are listed below for reference.     Microbiology: Recent Results (from the past 240 hour(s))  MRSA Next Gen by PCR, Nasal     Status: None   Collection Time: 12/04/22  5:15 PM   Specimen: Nasal Mucosa; Nasal Swab  Result Value Ref Range Status   MRSA by PCR Next Gen NOT DETECTED NOT DETECTED Final    Comment: (NOTE) The GeneXpert MRSA Assay (FDA approved for NASAL specimens only), is one component of a comprehensive MRSA colonization surveillance program. It is not intended to diagnose MRSA infection nor to guide or monitor treatment for MRSA infections. Test performance is not FDA approved in patients less than 25 years old. Performed at [facility], [address] [place], [place] [address]      Labs: BNP (last 3 results) No results for input(s): "BNP" in the last 8760 hours. Basic Metabolic Panel: Recent Labs  Lab 12/05/22 0522 12/06/22 0453 12/07/22 0443 12/08/22 0653 12/09/22 0550  NA 137 136 137 137 136  K 3.6 3.2* 3.6 3.8 3.8  CL 107 107 107 105 104  CO2 23 23 23 24 24   GLUCOSE 99 107* 97 91 90  BUN 13 23* 23* 20 22*  CREATININE 0.60 0.66 0.66 0.64 0.66  CALCIUM 8.4* 8.5* 8.6* 8.4* 8.5*  MG 2.1  --   --   --   --   PHOS 3.5 3.3 4.2 3.6 4.0   Liver Function Tests: Recent Labs  Lab 12/02/22 1723 12/04/22 1515 12/05/22 0522 12/06/22 0453 12/07/22 0443 12/08/22 0653 12/09/22 0550  AST 23 20 19   --   --   --   --   ALT 24 20 20   --   --   --   --   ALKPHOS 96 104 104  --   --   --   --   BILITOT 0.4 0.3 0.6  --   --   --   --   PROT 7.0 7.0 7.2  --   --   --   --   ALBUMIN 3.8 3.6 3.7 3.4* 3.4* 3.6 3.5   Recent Labs  Lab 12/02/22 1723  LIPASE 48   No results for input(s): "AMMONIA" in the last 168 hours. CBC: Recent Labs  Lab 12/02/22 1723 12/04/22 1515  12/05/22 0522 12/06/22 0453 12/07/22 0443 12/08/22 0653 12/09/22 0550  WBC 7.7 8.0 7.0 6.7 6.3 5.6 5.9  NEUTROABS 4.7 5.6 4.4  --   --   --   --   HGB 14.0 13.5 14.0 13.3 13.0 13.5 13.3  HCT 44.0 41.9 44.3 41.2 41.5 43.1 41.1  MCV 85.4 84.8 84.9 84.8 85.2 86.0 84.4  PLT 260 262 250 237 230 217 238   Cardiac Enzymes: Recent Labs  Lab 12/05/22 1054  CKTOTAL 48   BNP: Invalid input(s): "POCBNP" CBG: Recent Labs  Lab 12/04/22 1337 12/04/22 1559  GLUCAP 136* 128*   D-Dimer No results for input(s): "DDIMER" in the last 72 hours. Hgb A1c No results for input(s): "HGBA1C" in the last 72 hours. Lipid Profile No results for input(s): "CHOL", "HDL", "LDLCALC", "TRIG", "CHOLHDL", "LDLDIRECT" in the last 72 hours. Thyroid function studies No results for input(s): "TSH", "T4TOTAL", "T3FREE", "THYROIDAB" in the last 72 hours.  Invalid input(s): "FREET3" Anemia work up No results for input(s): "VITAMINB12", "FOLATE", "FERRITIN", "TIBC", "IRON", "RETICCTPCT" in the last 72 hours. Urinalysis    Component Value Date/Time   COLORURINE YELLOW (A) 12/02/2022 1723   APPEARANCEUR CLEAR (A) 12/02/2022 1723   APPEARANCEUR Hazy 07/11/2013 1844   LABSPEC 1.025 12/02/2022 1723   LABSPEC 1.027 07/11/2013 1844   PHURINE 6.0 12/02/2022 1723   GLUCOSEU NEGATIVE 12/02/2022 1723   GLUCOSEU Negative 07/11/2013 1844   HGBUR NEGATIVE 12/02/2022 1723   HGBUR moderate 02/08/2010 1331   BILIRUBINUR NEGATIVE 12/02/2022 1723   BILIRUBINUR negative 10/03/2021 1645   BILIRUBINUR Negative 07/11/2013 1844   KETONESUR NEGATIVE 12/02/2022 1723   PROTEINUR NEGATIVE 12/02/2022 1723   UROBILINOGEN 0.2 10/03/2021 1645   UROBILINOGEN 0.2 02/08/2010 1331   NITRITE NEGATIVE 12/02/2022 1723   LEUKOCYTESUR NEGATIVE 12/02/2022 1723   LEUKOCYTESUR Negative 07/11/2013 1844   Sepsis Labs Recent Labs  Lab 12/06/22 0453 12/07/22 0443 12/08/22 0653 12/09/22 0550  WBC 6.7 6.3 5.6 5.9   Microbiology Recent  Results (from the past 240 hour(s))  MRSA Next Gen by PCR, Nasal     Status: None   Collection Time: 12/04/22  5:15 PM   Specimen: Nasal Mucosa; Nasal Swab  Result Value Ref Range Status   MRSA by PCR Next Gen NOT DETECTED NOT DETECTED Final    Comment: (NOTE) The GeneXpert MRSA Assay (FDA approved for NASAL specimens only), is one component of a comprehensive MRSA colonization surveillance program. It is not intended to diagnose MRSA infection nor to guide or monitor treatment for MRSA infections. Test performance is not FDA approved in patients less than 34 years old. Performed at [facility], [address] [place], [place] [address]      Time coordinating discharge: Over 30 minutes  SIGNED:   Charise Killian, MD  [facility] 12/09/2022, 11:13 AM Pager   If 7PM-7AM, please contact night-coverage [email or web address]

## 2022-12-09 NOTE — Progress Notes (Signed)
Physical Therapy Treatment Patient Details Name: Bridget Maldonado MRN: [id number] DOB: 1981-04-15 Today's Date: 12/09/2022   History of Present Illness Per chart, 42 year old female presenting with acute onset of left sided weakness and numbness with left facial droop. Initial exam revealed left sided deficits with NIHSS of 4. There was some question of functionality on exam, requiring MRI brain for further assessment. 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 TNK was administered and the patient was admitted for stroke work up.    PT Comments    Pt denied at CIR due to progressing level of function. Pt now with d/c plans to return home and go for out-pt PT, OT, and ST at [facility]. Pt seen for skilled PT prior to d/c for mobility and stair training. Pt demonstrated gait training with SPC on level surface 150' x 2 with CGA for safety and vc's for proper sequencing. Pt negotiated 4 steps with bilateral rails, step to pattern with CGA. No LOB with gait or stairs, pt given LE HEP to be completed until Out-pt PT begins. No DME needs at this time.    Recommendations for follow up therapy are one component of a multi-disciplinary discharge planning process, led by the attending physician.  Recommendations may be updated based on patient status, additional functional criteria and insurance authorization.  Follow Up Recommendations       Assistance Recommended at Discharge Intermittent Supervision/Assistance  Patient can return home with the following A little help with walking and/or transfers;A lot of help with bathing/dressing/bathroom;Assistance with cooking/housework;Assist for transportation;Help with stairs or ramp for entrance   Equipment Recommendations  Other (comment) (Pt has a cane and RW at home)    Recommendations for Other Services       Precautions / Restrictions  Precautions Precautions: Fall Restrictions Weight Bearing Restrictions: No     Mobility  Bed Mobility Overal bed mobility: Needs Assistance Bed Mobility: Supine to Sit     Supine to sit: Supervision, HOB elevated     General bed mobility comments: Pt seated in recliner chair at begining and end of session    Transfers Overall transfer level: Needs assistance Equipment used: None, 1 person hand held assist Transfers: Sit to/from Stand Sit to Stand: Min guard                Ambulation/Gait Ambulation/Gait assistance: Min guard Gait Distance (Feet):  (150) Assistive device: Straight cane Gait Pattern/deviations: Step-through pattern, Decreased step length - left, Drifts right/left Gait velocity: decreased     General Gait Details: decreased L heel strike with slight hip hike noted. Quick to fatigue on Left LE   Stairs Stairs: Yes Stairs assistance: Min guard Stair Management: Two rails, Step to pattern Number of Stairs: 4 General stair comments:  (Good understanding and teach back demonstration)   Wheelchair Mobility    Modified Rankin (Stroke Patients Only)       Balance Overall balance assessment: Needs assistance Sitting-balance support: Feet supported Sitting balance-Leahy Scale: Good     Standing balance support: During functional activity, Single extremity supported Standing balance-Leahy Scale: Fair                              Cognition Arousal/Alertness: Awake/alert Behavior During Therapy: WFL for tasks assessed/performed Overall Cognitive Status: Within Functional Limits for tasks assessed  Exercises      General Comments        Pertinent Vitals/Pain Pain Assessment Pain Assessment: No/denies pain    Home Living                          Prior Function            PT Goals (current goals can now be found in the care plan section) Acute Rehab  PT Goals Patient Stated Goal: to return to home Progress towards PT goals: Progressing toward goals    Frequency    Min 4X/week      PT Plan      Co-evaluation              AM-PAC PT "6 Clicks" Mobility   Outcome Measure  Help needed turning from your back to your side while in a flat bed without using bedrails?: None Help needed moving from lying on your back to sitting on the side of a flat bed without using bedrails?: None Help needed moving to and from a bed to a chair (including a wheelchair)?: A Little Help needed standing up from a chair using your arms (e.g., wheelchair or bedside chair)?: A Little Help needed to walk in hospital room?: A Little Help needed climbing 3-5 steps with a railing? : A Little 6 Click Score: 20    End of Session Equipment Utilized During Treatment: Gait belt Activity Tolerance: Patient tolerated treatment well Patient left: with family/visitor present;in bed;with call bell/phone within reach Nurse Communication: Mobility status PT Visit Diagnosis: Ataxic gait (R26.0);Unsteadiness on feet (R26.81);Hemiplegia and hemiparesis Hemiplegia - Right/Left: Left Hemiplegia - dominant/non-dominant: Non-dominant Hemiplegia - caused by: Cerebral infarction     Time: 1220-1246 PT Time Calculation (min) (ACUTE ONLY): 26 min  Charges:  $Gait Training: 8-22 mins $Therapeutic Exercise: 8-22 mins                    Zadie Cleverly, PTA  Jannet Askew 12/09/2022, 2:45 PM

## 2022-12-10 ENCOUNTER — Telehealth: Payer: Self-pay

## 2022-12-10 NOTE — Transitions of Care (Post Inpatient/ED Visit) (Signed)
   12/10/2022  Name: Bridget Maldonado [facility] MRN: [id number] DOB: [birth year]  Today's TOC FU Call Status: Today's TOC FU Call Status:: Successful TOC FU Call Competed TOC FU Call Complete Date: 12/10/22  Transition Care Management Follow-up Telephone Call Date of Discharge: 12/09/22 Discharge Facility: [facility]) Type of Discharge: Inpatient Admission How have you been since you were released from the hospital?: Better Any questions or concerns?: No  Items Reviewed: Did you receive and understand the discharge instructions provided?: Yes Medications obtained,verified, and reconciled?: Yes (Medications Reviewed) Any new allergies since your discharge?: No Dietary orders reviewed?: NA Do you have support at home?: Yes People in Home: child(ren), adult, parent(s)  Medications Reviewed Today: Medications Reviewed Today     Reviewed by Sharia Reeve, CPhT (Pharmacy Technician) on 12/05/22 at 1026  Med List Status: Complete   Medication Order Taking? Sig Documenting Provider Last Dose Status Informant  albuterol (PROVENTIL HFA;VENTOLIN HFA) 108 (90 Base) MCG/ACT inhaler [id number] Yes Inhale 2 puffs into the lungs every 6 (six) hours as needed for wheezing or shortness of breath. Karie Schwalbe, MD unknown Active Family Member  ALPRAZolam Prudy Feeler) 0.25 MG tablet [id number] Yes Take 1 tablet (0.25 mg total) by mouth 2 (two) times daily as needed for anxiety. Karie Schwalbe, MD 12/04/2022 Active Family Member  DULoxetine (CYMBALTA) 30 MG capsule [id number] Yes Take 1 capsule (30 mg total) by mouth daily. Karie Schwalbe, MD 12/04/2022 Active Family Member  mometasone (ELOCON) 0.1 % ointment [id number] Yes APPLY TWICE DAILY AS NEEDED FOR PSORIASIS Karie Schwalbe, MD unknown Active Family Member  montelukast (SINGULAIR) 10 MG tablet [id number] Yes Take 1 tablet (10 mg total) by mouth at bedtime. Karie Schwalbe, MD 12/04/2022 Active Family Member  traMADol  (ULTRAM) 50 MG tablet [id number] Yes Take 1 tablet (50 mg total) by mouth 2 (two) times daily as needed. Eulis Foster, FNP unknown Active Family Member            Home Care and Equipment/Supplies: Were Home Health Services Ordered?: No Any new equipment or medical supplies ordered?: No  Functional Questionnaire: Do you need assistance with bathing/showering or dressing?: Yes Do you need assistance with meal preparation?: No Do you need assistance with eating?: No Do you have difficulty maintaining continence: No Do you need assistance with getting out of bed/getting out of a chair/moving?: No Do you have difficulty managing or taking your medications?: No  Follow up appointments reviewed: PCP Follow-up appointment confirmed?: Yes Date of PCP follow-up appointment?: 12/30/22 Follow-up Provider: [facility]- DOES NOT WANT TO MOVE APPT DUE TO TRANSPORTATION Do you need transportation to your follow-up appointment?: No Do you understand care options if your condition(s) worsen?: Yes-patient verbalized understanding    SIGNATURE TB,CMA

## 2022-12-15 ENCOUNTER — Encounter: Payer: Self-pay | Admitting: Internal Medicine

## 2022-12-15 ENCOUNTER — Ambulatory Visit: Payer: 59 | Admitting: Internal Medicine

## 2022-12-15 ENCOUNTER — Telehealth: Payer: Self-pay | Admitting: Internal Medicine

## 2022-12-15 VITALS — BP 136/88 | HR 90 | Temp 98.1°F | Ht 64.0 in | Wt 222.0 lb

## 2022-12-15 DIAGNOSIS — R519 Headache, unspecified: Secondary | ICD-10-CM | POA: Insufficient documentation

## 2022-12-15 DIAGNOSIS — I69354 Hemiplegia and hemiparesis following cerebral infarction affecting left non-dominant side: Secondary | ICD-10-CM | POA: Insufficient documentation

## 2022-12-15 DIAGNOSIS — F319 Bipolar disorder, unspecified: Secondary | ICD-10-CM

## 2022-12-15 DIAGNOSIS — Z8673 Personal history of transient ischemic attack (TIA), and cerebral infarction without residual deficits: Secondary | ICD-10-CM | POA: Insufficient documentation

## 2022-12-15 MED ORDER — BUTALBITAL-APAP-CAFFEINE 50-325-40 MG PO TABS: 1.0000 | ORAL_TABLET | Freq: Every day | ORAL | 0 refills | Status: DC | PRN

## 2022-12-15 MED ORDER — DULOXETINE HCL 60 MG PO CPEP: 60.0000 mg | ORAL_CAPSULE | Freq: Every day | ORAL | 3 refills | Status: DC

## 2022-12-15 MED ORDER — ALPRAZOLAM 0.25 MG PO TABS: 0.2500 mg | ORAL_TABLET | Freq: Three times a day (TID) | ORAL | 0 refills | Status: DC | PRN

## 2022-12-15 NOTE — Telephone Encounter (Signed)
Okay May not be able to do full hospital follow up--but will evaluate new symptoms

## 2022-12-15 NOTE — Telephone Encounter (Signed)
Pt's mom, Bridget Maldonado, called stating the pt had a stroke on 6/13 & is now experiencing a headache & jaw pain. Bridget Maldonado stated the pt did not want to return to the hosp, she wanted to see Dr. Alphonsus Sias instead. Bridget Maldonado stated since the stroke, the pt's speech as improved but she doesn't feel well today. Scheduled pt with [facility] today, 6/24 @ 2:45. Transferred pt to access nurse. Call back # [phone]

## 2022-12-15 NOTE — Telephone Encounter (Signed)
Pts mother (DPR signed) called back and said pt is not going to ED; pt is upset and nervous and pts mom wants appt with Dr Alphonsus Sias today. No available appts with any provider this afternoon. Explained to pts mom UC & ED precautions and she said she cannot make her daughter go to ED and wants appt today with Dr Alphonsus Sias. Dr Alphonsus Sias said can add pt 12/15/22 at 12:30. Pts mom voiced understanding and said they will be at [facility] by 12:30. Sending note to Dr Alphonsus Sias and Alphonsus Sias pool.

## 2022-12-15 NOTE — Assessment & Plan Note (Signed)
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

## 2022-12-15 NOTE — Assessment & Plan Note (Signed)
Likely caffeine and stress related Will have her wean off the caffeine (or limit significantly) Fioricet for occasional use Neuro eval

## 2022-12-15 NOTE — Telephone Encounter (Signed)
I spoke with pt now BP 155/99 and pt has new symptom of rt hand weakness; Dr Alphonsus Sias advised with new neurological symptom pt needs to go to ED. Pt voiced understanding  and pt will go to ED now. Sending note to Dr Alphonsus Sias and appt scheduled with Dr Alphonsus Sias 12/15/22 cancelled. Sending note to Dr Alphonsus Sias.Marland Kitchen

## 2022-12-15 NOTE — Telephone Encounter (Signed)
I had mostly been concerned about new weakness on non stroke side Best that she get seen, and I can decide if imaging indicated

## 2022-12-15 NOTE — Progress Notes (Signed)
Subjective:    Patient ID: Bridget Maldonado, female    DOB: 1981/05/10, 42 y.o.   MRN: [id number]  HPI Here with mom due to headache and right side weakness Triaged to go to ER but wouldn't go--so appointment here given  Admitted 6/13 ---left arm weakness at work Left facial numbness as well CT scan was negative but MRI showed left parietal lesion (and other smaller lesions) Got IV thrombolysis ICU for 1.5 days Left leg weakness also and trouble speaking Doesn't remember a lot of this  Doesn't remember when her symptoms improved Voice came and went Then better---just occasional stutter  Since home--leaving 6/18 Feels "screaming angry" Head is pounding Anxious also----"my brain feels different"---very stressed  Mom called today---because of her symptoms Jaw hurts--thinks it is from being so tense BP high at home Doesn't feel like with the stroke "Head is killing me"----hasn't changed since the stroke Has been working on left hand strength----today has problems squeezing the therapy ball with right hand  Ran out of xanax  Mom corroborates her impression that this is a mental health issue---she doesn't see symptoms like the stroke either  Does use a lot of caffeine  Current Outpatient Medications on File Prior to Visit  Medication Sig Dispense Refill   albuterol (PROVENTIL HFA;VENTOLIN HFA) 108 (90 Base) MCG/ACT inhaler Inhale 2 puffs into the lungs every 6 (six) hours as needed for wheezing or shortness of breath. 1 Inhaler 1   ALPRAZolam (XANAX) 0.25 MG tablet Take 1 tablet (0.25 mg total) by mouth 2 (two) times daily as needed for anxiety. 60 tablet 0   aspirin 81 MG chewable tablet Chew 1 tablet (81 mg total) by mouth daily. 30 tablet 0   atorvastatin (LIPITOR) 20 MG tablet Take 1 tablet (20 mg total) by mouth daily. 30 tablet 0   DULoxetine (CYMBALTA) 30 MG capsule Take 1 capsule (30 mg total) by mouth daily. 30 capsule 3   lisinopril (ZESTRIL) 5 MG tablet Take 1  tablet (5 mg total) by mouth daily. 30 tablet 0   mometasone (ELOCON) 0.1 % ointment APPLY TWICE DAILY AS NEEDED FOR PSORIASIS 45 g 1   montelukast (SINGULAIR) 10 MG tablet Take 1 tablet (10 mg total) by mouth at bedtime. 90 tablet 3   traMADol (ULTRAM) 50 MG tablet Take 1 tablet (50 mg total) by mouth 2 (two) times daily as needed. 30 tablet 0   No current facility-administered medications on file prior to visit.    Allergies  Allergen Reactions   Sulfa Antibiotics Nausea And Vomiting   Cipro [Ciprofloxacin Hcl] Nausea And Vomiting   Effexor [Venlafaxine Hcl] Other (See Comments)    Excessive sleeping, slept for 2 days    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   Increased risk of breast cancer 07/2017   IBIS=40%   Kidney infection    Pylonephritis when pregnant   LGSIL on Pap smear of cervix    Migraines    Obese    Psoriatic arthritis (HCC)  Secondary oligomenorrhea 07/16/2017    Past Surgical History:  Procedure Laterality Date   BREAST BIOPSY Left 8 years   CESAREAN SECTION     COLONOSCOPY     COLONOSCOPY W/ BIOPSIES     CYSTOSCOPY N/A 03/04/2022   Procedure: CYSTOSCOPY;  Surgeon: [place] Bing, MD;  Location: [facility] OR;  Service: Gynecology;  Laterality: N/A;   EAR TUBE REMOVAL     ENDOMETRIAL BIOPSY  12/23/2021   ESOPHAGOGASTRODUODENOSCOPY     LAPAROSCOPIC CHOLECYSTECTOMY  6/09   Dr. Lindie Spruce   TOTAL LAPAROSCOPIC HYSTERECTOMY WITH SALPINGECTOMY Bilateral 03/04/2022   Procedure: TOTAL LAPAROSCOPIC HYSTERECTOMY WITH SALPINGECTOMY;  Surgeon: [place] Bing, MD;   Location: [facility] OR;  Service: Gynecology;  Laterality: Bilateral;   TUBAL LIGATION  2017   TYMPANOSTOMY TUBE PLACEMENT      Family History  Problem Relation Age of Onset   Colon polyps Maternal Grandfather    Parkinson's disease Maternal Grandfather    Breast cancer Mother 8   Rheum arthritis Mother    Irritable bowel syndrome Mother    Asthma Father    Anxiety disorder Father        Severe, possible bipolar   Irritable bowel syndrome Maternal Grandmother    Diabetes Other        mat. great uncles   Alzheimer's disease Other        Dad's side   Colon cancer Other        mat GGF    Social History   Socioeconomic History   Marital status: Legally Separated    Spouse name: Not on file   Number of children: 1   Years of education: Not on file   Highest education level: Not on file  Occupational History   Occupation: Accounting    Comment: [employer]  Tobacco Use   Smoking status: Never    Passive exposure: Past   Smokeless tobacco: Never  Vaping Use   Vaping Use: Never used  Substance and Sexual Activity   Alcohol use: No    Alcohol/week: 0.0 standard drinks of alcohol   Drug use: No   Sexual activity: Yes    Birth control/protection: Surgical  Other Topics Concern   Not on file  Social History Narrative   Married 10/22   Social Determinants of Health   Financial Resource Strain: Not on file  Food Insecurity: No Food Insecurity (12/09/2022)   Hunger Vital Sign    Worried About Running Out of Food in the Last Year: Never true    Ran Out of Food in the Last Year: Never true  Transportation Needs: Not on file  Physical Activity: Not on file  Stress: Not on file  Social Connections: Not on file  Intimate Partner Violence: Not on file   Review of Systems Not sleeping since coming home--hard to get to sleep Tries to nap when daughter at day care     Objective:   Physical Exam Cardiovascular:     Rate and Rhythm: Normal rate and regular rhythm.     Heart sounds:  No murmur heard.    No gallop.  Pulmonary:     Effort: Pulmonary effort is normal.     Breath sounds: Normal breath sounds. No wheezing or rales.  Musculoskeletal:     Cervical back: Neck supple.  Lymphadenopathy:     Cervical: No cervical adenopathy.  Neurological:     Mental Status: She is oriented to person, place, and time.     Cranial Nerves: Cranial nerves 2-12 are  intact.     Motor: No tremor, atrophy or abnormal muscle tone.     Coordination: Romberg sign negative. Coordination normal.     Gait: Gait normal.     Comments: Subtle left arm and leg weakness (mostly proximal)            Assessment & Plan:

## 2022-12-15 NOTE — Assessment & Plan Note (Signed)
Very subtle left side weakness No obvious right side weakness to suggest a new stroke If any new neuro symptoms, she needs to go to ER On ASA, statin Will try to get her in with neuro

## 2022-12-16 ENCOUNTER — Other Ambulatory Visit: Payer: Self-pay

## 2022-12-16 ENCOUNTER — Ambulatory Visit: Payer: 59 | Attending: Internal Medicine | Admitting: Physical Therapy

## 2022-12-16 ENCOUNTER — Encounter: Payer: Self-pay | Admitting: Physical Therapy

## 2022-12-16 DIAGNOSIS — R262 Difficulty in walking, not elsewhere classified: Secondary | ICD-10-CM | POA: Diagnosis present

## 2022-12-16 DIAGNOSIS — R278 Other lack of coordination: Secondary | ICD-10-CM | POA: Diagnosis present

## 2022-12-16 DIAGNOSIS — M6281 Muscle weakness (generalized): Secondary | ICD-10-CM | POA: Diagnosis present

## 2022-12-16 DIAGNOSIS — R2689 Other abnormalities of gait and mobility: Secondary | ICD-10-CM | POA: Insufficient documentation

## 2022-12-16 DIAGNOSIS — R2681 Unsteadiness on feet: Secondary | ICD-10-CM | POA: Insufficient documentation

## 2022-12-16 DIAGNOSIS — R471 Dysarthria and anarthria: Secondary | ICD-10-CM | POA: Insufficient documentation

## 2022-12-16 NOTE — Therapy (Signed)
I have read and reviewed the attached note and am in agreement with the documentation provided.     This licensed clinician was present and actively directing care throughout the session at all times.  Grier Rocher PT, DPT  Physical Therapist - [place]  [facility]  5:11 PM 12/16/22      OUTPATIENT PHYSICAL THERAPY NEURO EVALUATION   Patient Name: Bridget Maldonado MRN: [id number] DOB:Jul 13, 1980, 42 y.o., female Today's Date: 12/16/2022   PCP: Karie Schwalbe, MD  REFERRING PROVIDER: Karie Schwalbe, MD   END OF SESSION:  PT End of Session - 12/16/22 1144     Visit Number 1    Number of Visits 24    Date for PT Re-Evaluation 03/10/23    Progress Note Due on Visit 10    PT Start Time 1146    PT Stop Time 1230    PT Time Calculation (min) 44 min    Equipment Utilized During Treatment Gait belt    Activity Tolerance Patient tolerated treatment well    Behavior During Therapy [facility] for tasks assessed/performed             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   Increased risk of breast cancer 07/2017   IBIS=40%   Kidney infection    Pylonephritis when pregnant   LGSIL on Pap smear of cervix    Migraines    Obese    Psoriatic arthritis (HCC)    Secondary oligomenorrhea 07/16/2017   Past Surgical History:  Procedure Laterality Date   BREAST BIOPSY Left 8 years   CESAREAN SECTION     COLONOSCOPY     COLONOSCOPY W/ BIOPSIES     CYSTOSCOPY N/A 03/04/2022   Procedure: CYSTOSCOPY;  Surgeon: Lyman Bing, MD;  Location: [facility] OR;   Service: Gynecology;  Laterality: N/A;   EAR TUBE REMOVAL     ENDOMETRIAL BIOPSY  12/23/2021   ESOPHAGOGASTRODUODENOSCOPY     LAPAROSCOPIC CHOLECYSTECTOMY  6/09   Dr. Lindie Spruce   TOTAL LAPAROSCOPIC HYSTERECTOMY WITH SALPINGECTOMY Bilateral 03/04/2022   Procedure: TOTAL LAPAROSCOPIC HYSTERECTOMY WITH SALPINGECTOMY;  Surgeon: [place] Bing, MD;  Location: [facility] OR;  Service: Gynecology;  Laterality: Bilateral;   TUBAL LIGATION  2017   TYMPANOSTOMY TUBE PLACEMENT     Patient Active Problem List   Diagnosis Date Noted   Hemiplegia and hemiparesis following cerebral infarction affecting left non-dominant side (HCC) 12/15/2022   Acute intractable headache 12/15/2022   Obesity (BMI 30-39.9) 12/06/2022   Hypokalemia 12/06/2022   Acute CVA (cerebrovascular accident) (HCC) 12/04/2022   Prediabetes 09/17/2022   Preventative health care 06/13/2020   Bipolar disease, chronic (HCC) 07/27/2018   Family history of colon cancer 07/27/2017   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   Family history of breast cancer 07/16/2017   Psoriatic arthritis (HCC) 10/28/2016   Chronic low back pain 10/28/2016   Essential hypertension 10/18/2014   Arthropathia 11/24/2013   Obstipation 04/12/2013   Migraine with status migrainosus 09/08/2012   IBS (irritable bowel syndrome)-diarrhea predominant  06/07/2012   Mild persistent asthma 05/26/2011   Severe anxiety with panic 08/29/2006   Panic disorder without agoraphobia 08/29/2006    ONSET DATE: 12/04/2022  REFERRING DIAG: Cerebral infarction, unspecified  THERAPY DIAG:  Unsteadiness on feet  Other abnormalities of gait and mobility  Other lack of coordination  Difficulty in walking, not elsewhere classified  Rationale for Evaluation and Treatment: Rehabilitation  SUBJECTIVE:                                                                                                                                                                                              SUBJECTIVE STATEMENT: Pt reports having large improvement since hospital discharge, he states she can now walk without a cane, but will carry it if she knows she will be out for a while. She mentions being tired throughout the day and often feels like she is in slow motion. Her symptoms are most noticeable to in the morning but go away as the day progresses. 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  Pt accompanied by: family member; Father  PERTINENT HISTORY: Per PT Acute Care Evaluation on 12/06/22: "41 year old female presenting with acute onset of left sided weakness and numbness with left facial droop.Marland KitchenMarland KitchenPt reports non-dermatomal pattern of diminished sensation and decreased strength on LUE and LLE in comparison to RUE and RLE. Pt demonstrates mild coordination deficits in LLE and LUE. Most noticeable with ambulation; L foot drag, decreased step length/height bilaterally, and decreased gait velocity noted." Pt now walking without AD on initial evaluation. PAIN:  Are you having pain? Yes, Intermittent stinging/quick pains in left arm and leg. Seldom occurs now and not an issue.  PRECAUTIONS: Fall  WEIGHT BEARING RESTRICTIONS: No  FALLS: Has patient fallen in last 6 months? No  LIVING ENVIRONMENT: Lives with: lives with their family Lives in: House/apartment Stairs: Yes: External: 4-5 steps; on right going up, on left going up, and can reach both Has following equipment at home: None  PLOF: Independent  PATIENT GOALS: Pt wants to drive independently, also wants to get back to work.  OBJECTIVE:   DIAGNOSTIC FINDINGS:  CT HEAD WITHOUT CONTRAST on 12/04/22: IMPRESSION: No acute intracranial hemorrhage or evidence of evolving large vessel territory infarct. ASPECT score is 10.   MRI HEAD WITHOUT AND WITH CONTRAST on 12/06/22: IMPRESSION: 1. Punctate focus of restricted diffusion in the left parietal lobe is less conspicuous on today's study. 2.  Additional cortical and subcortical T2 hyperintensities in the high left parietal lobe are again noted, consistent remote infarcts in this area.  3. No other acute intracranial abnormality or significant interval change.    COGNITION: Overall cognitive status: Impaired    COORDINATION: Assess 2nd session  POSTURE: rounded shoulders  LOWER EXTREMITY MMT:    MMT Right Eval Left Eval  Hip flexion 4 4-  Hip extension    Hip abduction 4 4-  Hip adduction 4 4-  Hip internal rotation    Hip external rotation    Knee flexion 4 3+  Knee extension 4 4-  Ankle dorsiflexion 4 3+  Ankle plantarflexion    Ankle inversion    Ankle eversion    (Blank rows = not tested)  STAIRS: Level of Assistance: CGA Stair Negotiation Technique: Step to Pattern with Bilateral Rails Number of Stairs: 4  Height of Stairs: standard  Comments: performed very slowly, pt generally safe with activity, but could improve to alternating patter and decreased use of UE support.  GAIT: Gait pattern: decreased stride length, decreased hip/knee flexion- Left, and decreased ankle dorsiflexion- Left Distance walked: 756 Assistive device utilized: None Level of assistance: CGA Comments: Pt reports sensations of drifting/leaning to R side when walking. SPT also notes a physical drift to R side during straight walking paths  FUNCTIONAL TESTS:  5 times sit to stand: 16.67 sec 6 minute walk test: 756 feet 10 meter walk test: 15.41 sec; .65 m/s Dynamic Gait Index: Below  [facility] PT Assessment - 12/16/22 0001       Standardized Balance Assessment   Standardized Balance Assessment Dynamic Gait Index      Dynamic Gait Index   Level Surface Normal    Change in Gait Speed Moderate Impairment    Gait with Horizontal Head Turns Moderate Impairment    Gait with Vertical Head Turns Moderate Impairment    Gait and Pivot Turn Moderate Impairment    Step Over Obstacle Moderate Impairment    Step Around Obstacles Mild  Impairment    Steps Moderate Impairment    Total Score 11              PATIENT SURVEYS:  FOTO 60  TODAY'S TREATMENT:                                                                                                                              DATE: 12/16/22   Initial evaluation preformed today; Tests and measures done: FOTO, 5X STS, , , DGI.  Blood Pressure assessed: 151/108 - seated ; 1425/101 standing immediately after.  142/100 - seated after 6 min walk test.   PATIENT EDUCATION: Education details: Educated on instruction for tests and measures, outcome measures, and POC. Also educated pt on reasons for general fatigue as it related to her stroke Person educated: Patient Education method: Medical illustrator Education comprehension: verbalized understanding  HOME EXERCISE PROGRAM: Address in 2nd session?  GOALS: Goals reviewed with patient? No  SHORT TERM GOALS: Target date: 01/13/2023    Patient will be independent in home exercise program  to improve strength/mobility for better functional independence with ADLs. Baseline: No HEP currently  Goal status: INITIAL   LONG TERM GOALS: Target date: 03/10/2023   1.  Patient (> 74 years old) will complete five times sit to stand test in < 15 seconds indicating an increased LE strength and improved balance. Baseline: 16.67 sec Goal status: INITIAL  2.  Patient will increase FOTO score to equal to or greater than  68   to demonstrate statistically significant improvement in mobility and quality of life.  Baseline: 60 Goal status: INITIAL     3.   Patient will increase DGI score by > 5  to reduce fall risk and demonstrate improved gait ability. Baseline: 11 Goal status: INITIAL  4.   Patient will increase 10 meter walk test to >1.74m/s as to improve gait speed for better community ambulation and to reduce fall risk. Baseline: .65 m/s Goal status: INITIAL  5.   Patient will increase six minute  walk test distance to >1000 for progression to community ambulator and improve gait ability Baseline: 756 feet Goal status: INITIAL    ASSESSMENT:  CLINICAL IMPRESSION: Patient is a 42 y.o. female who was seen today for physical therapy evaluation and treatment for Cerebral infarction, unspecified. She is currently presenting with general fatigue per pt report. She is also showing increased risk for falls and general balance impairments based on Dynamic Gait index test. Patient presents with impaired LE strength and power AEB her 5X STS time. Pt has diminished endurance/gait speed AEB 6 minute walk test distance. Pt will benefit from endurance training as well as general balance and coordination training. Pt will continue to benefit from skilled physical therapy intervention to address impairments, improve QOL, and attain therapy goals.    OBJECTIVE IMPAIRMENTS: Abnormal gait, decreased activity tolerance, decreased balance, decreased coordination, decreased endurance, and decreased strength.   ACTIVITY LIMITATIONS: stairs and locomotion level  PARTICIPATION LIMITATIONS: driving, community activity, and occupation  PERSONAL FACTORS: Past/current experiences and Time since onset of injury/illness/exacerbation are also affecting patient's functional outcome.   REHAB POTENTIAL: Good  CLINICAL DECISION MAKING: Evolving/moderate complexity  EVALUATION COMPLEXITY: Moderate  PLAN:  PT FREQUENCY: 1-2x/week  PT DURATION: 12 weeks  PLANNED INTERVENTIONS: Therapeutic exercises, Therapeutic activity, Neuromuscular re-education, Balance training, Gait training, Patient/Family education, Self Care, Joint mobilization, Stair training, Cryotherapy, Moist heat, and Re-evaluation  PLAN FOR NEXT SESSION: Administration of HEP;adjust as needed.Quick coordination screen. Balance and coordination training. Strengthening.   Nani Gasser, Student-PT 12/16/2022, 2:52 PM    Grier Rocher PT, DPT   Physical Therapist - [facility]  5:11 PM 12/16/22

## 2022-12-17 LAB — PROTHROMBIN GENE MUTATION

## 2022-12-18 ENCOUNTER — Encounter: Payer: Self-pay | Admitting: *Deleted

## 2022-12-18 ENCOUNTER — Ambulatory Visit: Payer: 59 | Admitting: Speech Pathology

## 2022-12-18 ENCOUNTER — Ambulatory Visit: Payer: 59 | Admitting: Physical Therapy

## 2022-12-18 DIAGNOSIS — M6281 Muscle weakness (generalized): Secondary | ICD-10-CM

## 2022-12-18 DIAGNOSIS — R2681 Unsteadiness on feet: Secondary | ICD-10-CM | POA: Diagnosis not present

## 2022-12-18 DIAGNOSIS — R262 Difficulty in walking, not elsewhere classified: Secondary | ICD-10-CM

## 2022-12-18 DIAGNOSIS — R278 Other lack of coordination: Secondary | ICD-10-CM

## 2022-12-18 DIAGNOSIS — R2689 Other abnormalities of gait and mobility: Secondary | ICD-10-CM

## 2022-12-18 DIAGNOSIS — R471 Dysarthria and anarthria: Secondary | ICD-10-CM

## 2022-12-18 NOTE — Therapy (Signed)
OUTPATIENT PHYSICAL THERAPY NEURO TREATMENT   Patient Name: Bridget Maldonado MRN: [id number] DOB:July 27, 1980, 42 y.o., female Today's Date: 12/18/2022   PCP: Karie Schwalbe, MD  REFERRING PROVIDER: Karie Schwalbe, MD   END OF SESSION:  PT End of Session - 12/18/22 1321     Visit Number 2    Number of Visits 24    Date for PT Re-Evaluation 03/10/23    Progress Note Due on Visit 10    PT Start Time 1345    PT Stop Time 1430    PT Time Calculation (min) 45 min    Equipment Utilized During Treatment Gait belt    Activity Tolerance Patient tolerated treatment well    Behavior During Therapy [facility] for tasks assessed/performed              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   Increased risk of breast cancer 07/2017   IBIS=40%   Kidney infection    Pylonephritis when pregnant   LGSIL on Pap smear of cervix    Migraines    Obese    Psoriatic arthritis (HCC)    Secondary oligomenorrhea 07/16/2017   Past Surgical History:  Procedure Laterality Date   BREAST BIOPSY Left 8 years   CESAREAN SECTION     COLONOSCOPY     COLONOSCOPY W/ BIOPSIES     CYSTOSCOPY N/A 03/04/2022   Procedure: CYSTOSCOPY;  Surgeon: [place] Bing, MD;  Location: [facility] OR;  Service: Gynecology;  Laterality: N/A;   EAR TUBE REMOVAL     ENDOMETRIAL BIOPSY  12/23/2021   ESOPHAGOGASTRODUODENOSCOPY     LAPAROSCOPIC CHOLECYSTECTOMY  6/09   Dr. Lindie Spruce   TOTAL LAPAROSCOPIC HYSTERECTOMY WITH SALPINGECTOMY Bilateral 03/04/2022   Procedure: TOTAL LAPAROSCOPIC HYSTERECTOMY WITH SALPINGECTOMY;   Surgeon: Herman Bing, MD;  Location: [facility] OR;  Service: Gynecology;  Laterality: Bilateral;   TUBAL LIGATION  2017   TYMPANOSTOMY TUBE PLACEMENT     Patient Active Problem List   Diagnosis Date Noted   Hemiplegia and hemiparesis following cerebral infarction affecting left non-dominant side (HCC) 12/15/2022   Acute intractable headache 12/15/2022   Obesity (BMI 30-39.9) 12/06/2022   Hypokalemia 12/06/2022   Acute CVA (cerebrovascular accident) (HCC) 12/04/2022   Prediabetes 09/17/2022   Preventative health care 06/13/2020   Bipolar disease, chronic (HCC) 07/27/2018   Family history of colon cancer 07/27/2017   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   Family history of breast cancer 07/16/2017   Psoriatic arthritis (HCC) 10/28/2016   Chronic low back pain 10/28/2016   Essential hypertension 10/18/2014   Arthropathia 11/24/2013   Obstipation 04/12/2013   Migraine with status migrainosus 09/08/2012   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   Mild persistent asthma 05/26/2011   Severe anxiety with panic 08/29/2006   Panic disorder without agoraphobia 08/29/2006    ONSET DATE: 12/04/2022  REFERRING DIAG: Cerebral infarction, unspecified  THERAPY DIAG:  Unsteadiness on feet  Other abnormalities of gait and mobility  Other lack of coordination  Difficulty in walking,  not elsewhere classified  Muscle weakness (generalized)  Rationale for Evaluation and Treatment: Rehabilitation  SUBJECTIVE:                                                                                                                                                                                             SUBJECTIVE STATEMENT: Pt reports doing well today, notes fatigue is still a large issue, mentioning she had to sleep most of the day yesterday.  Pt accompanied by: self  PERTINENT HISTORY: Per PT Acute Care Evaluation on 12/06/22: "42 year old female presenting with acute onset  of left sided weakness and numbness with left facial droop.Marland KitchenMarland KitchenPt reports non-dermatomal pattern of diminished sensation and decreased strength on LUE and LLE in comparison to RUE and RLE. Pt demonstrates mild coordination deficits in LLE and LUE. Most noticeable with ambulation; L foot drag, decreased step length/height bilaterally, and decreased gait velocity noted." Pt now walking without AD on initial evaluation. PAIN:  Are you having pain? Yes, Intermittent stinging/quick pains in left arm and leg. Seldom occurs now and not an issue.  PRECAUTIONS: Fall  WEIGHT BEARING RESTRICTIONS: No  FALLS: Has patient fallen in last 6 months? No  LIVING ENVIRONMENT: Lives with: lives with their family Lives in: House/apartment Stairs: Yes: External: 4-5 steps; on right going up, on left going up, and can reach both Has following equipment at home: None  PLOF: Independent  PATIENT GOALS: Pt wants to drive independently, also wants to get back to work.  OBJECTIVE:   DIAGNOSTIC FINDINGS:  CT HEAD WITHOUT CONTRAST on 12/04/22: IMPRESSION: No acute intracranial hemorrhage or evidence of evolving large vessel territory infarct. ASPECT score is 10.   MRI HEAD WITHOUT AND WITH CONTRAST on 12/06/22: IMPRESSION: 1. Punctate focus of restricted diffusion in the left parietal lobe is less conspicuous on today's study. 2. Additional cortical and subcortical T2 hyperintensities in the high left parietal lobe are again noted, consistent remote infarcts in this area. 3. No other acute intracranial abnormality or significant interval change.    COGNITION: Overall cognitive status: Impaired    COORDINATION: Slight finger to nose impairment with L arm, additionally some slight impairments in heel to shin with L leg.  POSTURE: rounded shoulders  LOWER EXTREMITY MMT:    MMT Right Eval Left Eval  Hip flexion 4 4-  Hip extension    Hip abduction 4 4-  Hip adduction 4 4-  Hip internal rotation     Hip external rotation    Knee flexion 4 3+  Knee extension 4 4-  Ankle dorsiflexion 4 3+  Ankle plantarflexion    Ankle inversion  Ankle eversion    (Blank rows = not tested)  STAIRS: Level of Assistance: CGA Stair Negotiation Technique: Step to Pattern with Bilateral Rails Number of Stairs: 4  Height of Stairs: standard  Comments: performed very slowly, pt generally safe with activity, but could improve to alternating patter and decreased use of UE support.  GAIT: Gait pattern: decreased stride length, decreased hip/knee flexion- Left, and decreased ankle dorsiflexion- Left Distance walked: 756 Assistive device utilized: None Level of assistance: CGA Comments: Pt reports sensations of drifting/leaning to R side when walking. SPT also notes a physical drift to R side during straight walking paths  FUNCTIONAL TESTS:  5 times sit to stand: 16.67 sec 6 minute walk test: 756 feet 10 meter walk test: 15.41 sec; .65 m/s Dynamic Gait Index: Below     PATIENT SURVEYS:  FOTO 60  TODAY'S TREATMENT:                                                                                                                              DATE: 12/18/22  Blood pressure assessed: 124/96 - Seated at beginning of session  Assessment of coordination; see above. TE: Introduction and education on HEP including: -SLS holds with as little UE support as possible.  -Marches with GTB around toes with UE support -Side steps using GTB around ankles with UE support Can see further details below in HEP section.  NMR: -Motor-Cognitive dual task ambulation in hallway, x5 laps at 80 feet per lap. Each lap pt tasked to name various categories of items: grocery items, numbers, pictures, all. Working on visual scanning and head turns as well. Noting R side lateral drift intensifies with increased cognitive load.  -Activity Description: Blaze foot taps in // bars. 2 in front on step, 2 of to the sides. Working  on Radio producer dual task. Activity Setting: [facility] Focus setting was selected to refine precision and concentration, isolating specific muscle groups or movements to enhance overall coordination and targeted muscle engagement.  Number of Pods:  4 Cycles/Sets:  2 sets, 1st with UE support, 2nd without UE support. Duration (Time or Hit Count):  2 minutes    PATIENT EDUCATION: Education details: Educated on instruction for tests and measures, outcome measures, and POC. Also educated pt on reasons for general fatigue as it relates to her stroke Person educated: Patient Education method: Medical illustrator Education comprehension: verbalized understanding  HOME EXERCISE PROGRAM: Address in 2nd session  GOALS: Goals reviewed with patient? No  SHORT TERM GOALS: Target date: 01/13/2023    Patient will be independent in home exercise program to improve strength/mobility for better functional independence with ADLs. Baseline: No HEP currently  Goal status: INITIAL   LONG TERM GOALS: Target date: 03/10/2023   1.  Patient (> 41 years old) will complete five times sit to stand test in < 15 seconds indicating an increased LE strength and improved balance. Baseline: 16.67 sec Goal status: INITIAL  2.  Patient will increase FOTO score to equal to or greater than  68   to demonstrate statistically significant improvement in mobility and quality of life.  Baseline: 60 Goal status: INITIAL   3.   Patient will increase DGI score by > 5  to reduce fall risk and demonstrate improved gait ability. Baseline: 11 Goal status: INITIAL  4.   Patient will increase 10 meter walk test to >1.87m/s as to improve gait speed for better community ambulation and to reduce fall risk. Baseline: .65 m/s Goal status: INITIAL  5.   Patient will increase six minute walk test distance to >1000 for progression to community ambulator and improve gait ability Baseline: 756 feet Goal status:  INITIAL    ASSESSMENT:  CLINICAL IMPRESSION: Patient appeared motivated and ready for treatment on this day. Began session with residual coordination assessment, noting slight deficits on her left side. Followed up with education and demonstration of HEP for pt per above, built to address functional LE strength and balance. Note patient has trouble with motor-cognitive tasks evidenced by deterioration of gait quality during hallway ambulation. Pt does notably worse with dual cognitive tasks, unable to continue with both tasks, or has significant trouble processing internal lists with cues such as 'name items that are green'. Pt will continue to benefit from skilled physical therapy intervention to address impairments, improve QOL, and attain therapy goals.   OBJECTIVE IMPAIRMENTS: Abnormal gait, decreased activity tolerance, decreased balance, decreased coordination, decreased endurance, and decreased strength.   ACTIVITY LIMITATIONS: stairs and locomotion level  PARTICIPATION LIMITATIONS: driving, community activity, and occupation  PERSONAL FACTORS: Past/current experiences and Time since onset of injury/illness/exacerbation are also affecting patient's functional outcome.   REHAB POTENTIAL: Good  CLINICAL DECISION MAKING: Evolving/moderate complexity  EVALUATION COMPLEXITY: Moderate  PLAN:  PT FREQUENCY: 1-2x/week  PT DURATION: 12 weeks  PLANNED INTERVENTIONS: Therapeutic exercises, Therapeutic activity, Neuromuscular re-education, Balance training, Gait training, Patient/Family education, Self Care, Joint mobilization, Stair training, Cryotherapy, Moist heat, and Re-evaluation  PLAN FOR NEXT SESSION: ;adjust HEP as needed.Balance and coordination training. Strengthening. Dual tasks interventions when applicable.   Nani Gasser, Student-PT 12/18/2022, 4:48 PM    I have read and reviewed the attached note and am in agreement with the documentation provided.     This licensed  clinician was present and actively directing care throughout the session at all times.  Grier Rocher PT, DPT  Physical Therapist - [place]  [facility]  4:48 PM 12/18/22

## 2022-12-18 NOTE — Therapy (Signed)
OUTPATIENT SPEECH LANGUAGE PATHOLOGY  EVALUATION ONLY    Patient Name: Bridget Maldonado MRN: [id number] DOB:[birth year], 42 y.o., female Today's Date: 12/18/2022   PCP: Nada Libman REFERRING PROVIDER: Tillman Abide, MD    End of Session - 12/18/22 1452     Visit Number 1    SLP Start Time 1445    SLP Stop Time  1509    SLP Time Calculation (min) 24 min    Activity Tolerance Patient tolerated treatment well             No past medical history on file.  The histories are not reviewed yet. Please review them in the "History" navigator section and refresh this SmartLink. Patient Active Problem List   Diagnosis Date Noted   Hemiplegia and hemiparesis following cerebral infarction affecting left non-dominant side (HCC) 12/15/2022   Acute intractable headache 12/15/2022   Obesity (BMI 30-39.9) 12/06/2022   Hypokalemia 12/06/2022   Acute CVA (cerebrovascular accident) (HCC) 12/04/2022   Prediabetes 09/17/2022   Preventative health care 06/13/2020   Bipolar disease, chronic (HCC) 07/27/2018   Family history of colon cancer 07/27/2017   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   Family history of breast cancer 07/16/2017   Psoriatic arthritis (HCC) 10/28/2016   Chronic low back pain 10/28/2016   Essential hypertension 10/18/2014   Arthropathia 11/24/2013   Obstipation 04/12/2013   Migraine with status migrainosus 09/08/2012   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   Mild persistent asthma 05/26/2011   Severe anxiety with panic 08/29/2006   Panic disorder without agoraphobia 08/29/2006    ONSET DATE: 12/04/2022   REFERRING DIAG: I63.9 (ICD-10-CM) - Cerebral infarction, unspecified   THERAPY DIAG:  Dysarthria and anarthria  Rationale for Evaluation and Treatment Rehabilitation  SUBJECTIVE:   SUBJECTIVE STATEMENT: Pt is known to this Clinical research associate from evaluation during inpatient stay Pt accompanied by: self  PERTINENT HISTORY: Pt is a 42  year old female with history of chronic bipolar, acute headache following CVA and CVA on 12/04/2022.  DIAGNOSTIC FINDINGS:  MRI 12/04/2022 IMPRESSION: 1. Punctate focus of restricted diffusion in the left parietal lobe, consistent with acute/subacute infarct. 2. Multiple small T2 hyperintense foci in the left parietal lobe, adjacent to the focus of restricted diffusion, suggestive of prior small infarcts. 3. No high-grade flow-limiting stenosis or proximal branch occlusion of the intracranial circulation.    PAIN:  Are you having pain? No   FALLS: Has patient fallen in last 6 months?  No  LIVING ENVIRONMENT: Lives with: lives alone Lives in: House/apartment  PLOF:  Level of assistance: Independent with ADLs, Independent with IADLs Employment: Full-time employment   PATIENT GOALS   "I am a lot better. I am not sure what there is to work on"  OBJECTIVE:   COGNITIVE COMMUNICATION: Overall cognitive status: Within functional limits for tasks assessed Auditory comprehension: WFL Verbal expression: WFL Functional communication: WFL  AUDITORY COMPREHENSION: Overall auditory comprehension: Appears intact YES/NO questions: Appears intact Following directions: Appears intact Conversation: Complex  READING COMPREHENSION: Intact  EXPRESSION: verbal  VERBAL EXPRESSION: Level of generative/spontaneous verbalization: conversation Automatic speech: name: intact and social response: intact  Repetition: Appears intact Pragmatics: Appears intact Non-verbal means of communication: N/A   WRITTEN EXPRESSION: Dominant hand: right   Written expression: Appears intact  MOTOR SPEECH: Overall motor speech: Appears intact Respiration: diaphragmatic/abdominal breathing Phonation: normal Resonance: WFL Articulation: Appears intact Intelligibility: Intelligible Motor planning: Appears intact  ORAL MOTOR EXAMINATION: Facial : WFL Lingual: WFL Velum: WFL Mandible: WFL Cough:  WFL Voice:  WFL     PATIENT EDUCATION: Education details: great improvement, currently functional abilities Person educated: Patient Education method: Explanation Education comprehension: verbalized understanding    ASSESSMENT:  CLINICAL IMPRESSION:  Patient is a 42  y.o. female who was seen today for a cognitive communication evaluation. She presents with normal cognitive function as well as fully intelligible speech at the complex conversation level with unknown listener.    PLAN: No ST services indicated at this time  Keena Heesch B. Dreama Saa, M.S., CCC-SLP, Tree surgeon Certified Brain Injury Specialist [facility]  [facility] Rehabilitation Services Office [phone] Ascom [phone] Fax [phone]

## 2022-12-23 ENCOUNTER — Ambulatory Visit: Payer: 59 | Admitting: Physical Therapy

## 2022-12-23 LAB — FACTOR 5 LEIDEN

## 2022-12-24 ENCOUNTER — Encounter: Payer: 59 | Admitting: Speech Pathology

## 2022-12-24 ENCOUNTER — Encounter: Payer: Self-pay | Admitting: Physical Therapy

## 2022-12-24 ENCOUNTER — Ambulatory Visit: Payer: 59 | Attending: Internal Medicine | Admitting: Physical Therapy

## 2022-12-24 DIAGNOSIS — R2681 Unsteadiness on feet: Secondary | ICD-10-CM | POA: Diagnosis present

## 2022-12-24 DIAGNOSIS — R2689 Other abnormalities of gait and mobility: Secondary | ICD-10-CM | POA: Diagnosis present

## 2022-12-24 DIAGNOSIS — R262 Difficulty in walking, not elsewhere classified: Secondary | ICD-10-CM | POA: Diagnosis present

## 2022-12-24 DIAGNOSIS — R278 Other lack of coordination: Secondary | ICD-10-CM | POA: Insufficient documentation

## 2022-12-24 DIAGNOSIS — M6281 Muscle weakness (generalized): Secondary | ICD-10-CM | POA: Insufficient documentation

## 2022-12-24 NOTE — Therapy (Signed)
OUTPATIENT PHYSICAL THERAPY NEURO TREATMENT   Patient Name: Bridget Maldonado MRN: [id number] DOB:1980-07-17, 42 y.o., female Today's Date: 12/24/2022   PCP: Karie Schwalbe, MD  REFERRING PROVIDER: Karie Schwalbe, MD   END OF SESSION:  PT End of Session - 12/24/22 1548     Visit Number 3    Number of Visits 24    Date for PT Re-Evaluation 03/10/23    Progress Note Due on Visit 10    PT Start Time 1345    PT Stop Time 1430    PT Time Calculation (min) 45 min    Equipment Utilized During Treatment Gait belt    Activity Tolerance Patient tolerated treatment well    Behavior During Therapy [facility] for tasks assessed/performed               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   Increased risk of breast cancer 07/2017   IBIS=40%   Kidney infection    Pylonephritis when pregnant   LGSIL on Pap smear of cervix    Migraines    Obese    Psoriatic arthritis (HCC)    Secondary oligomenorrhea 07/16/2017   Past Surgical History:  Procedure Laterality Date   BREAST BIOPSY Left 8 years   CESAREAN SECTION     COLONOSCOPY     COLONOSCOPY W/ BIOPSIES     CYSTOSCOPY N/A 03/04/2022   Procedure: CYSTOSCOPY;  Surgeon: Montezuma Bing, MD;  Location: [facility] OR;  Service: Gynecology;  Laterality: N/A;   EAR TUBE REMOVAL     ENDOMETRIAL BIOPSY  12/23/2021   ESOPHAGOGASTRODUODENOSCOPY     LAPAROSCOPIC CHOLECYSTECTOMY  6/09   Dr. 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 side (HCC) 12/15/2022   Acute intractable headache 12/15/2022   Obesity (BMI 30-39.9) 12/06/2022   Hypokalemia 12/06/2022   Acute CVA (cerebrovascular accident) (HCC) 12/04/2022   Prediabetes 09/17/2022   Preventative health care 06/13/2020   Bipolar disease, chronic (HCC) 07/27/2018   Family history of colon cancer 07/27/2017   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   Family history of breast cancer 07/16/2017   Psoriatic arthritis (HCC) 10/28/2016   Chronic low back pain 10/28/2016   Essential hypertension 10/18/2014   Arthropathia 11/24/2013   Obstipation 04/12/2013   Migraine with status migrainosus 09/08/2012   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   Mild persistent asthma 05/26/2011   Severe anxiety with panic 08/29/2006   Panic disorder without agoraphobia 08/29/2006    ONSET DATE: 12/04/2022  REFERRING DIAG: Cerebral infarction, unspecified  THERAPY DIAG:  Unsteadiness on feet  Other abnormalities of gait and mobility  Other lack of coordination  Difficulty in  walking, not elsewhere classified  Muscle weakness (generalized)  Rationale for Evaluation and Treatment: Rehabilitation  SUBJECTIVE:                                                                                                                                                                                             SUBJECTIVE STATEMENT: Pt reports doing well, notes her fatigue is much better and has been working on walking daily. Notes getting better at dual tasking at home. Neurologist visit went well, she has gotten clearance to work and drive as needed per pts report. She reports her BP is  still circulating in 140s-150s/80-90s   Pt accompanied by: self  PERTINENT HISTORY: Per PT Acute Care Evaluation on 12/06/22: "42 year old female presenting with acute onset of left sided weakness and numbness with left facial droop.Marland KitchenMarland KitchenPt reports non-dermatomal pattern of diminished sensation and decreased strength on LUE and LLE in comparison to RUE and RLE. Pt demonstrates mild coordination deficits in LLE and LUE. Most noticeable with ambulation; L foot drag, decreased step length/height bilaterally, and decreased gait velocity noted." Pt now walking without AD on initial evaluation. PAIN:  Are you having pain? Yes, Intermittent stinging/quick pains in left arm and leg. Seldom occurs now and not an issue.  PRECAUTIONS: Fall  WEIGHT BEARING RESTRICTIONS: No  FALLS: Has patient fallen in last 6 months? No  LIVING ENVIRONMENT: Lives with: lives with their family Lives in: House/apartment Stairs: Yes: External: 4-5 steps; on right going up, on left going up, and can reach both Has following equipment at home: None  PLOF: Independent  PATIENT GOALS: Pt wants to drive independently, also wants to get back to work.  OBJECTIVE:   DIAGNOSTIC FINDINGS:  CT HEAD WITHOUT CONTRAST on 12/04/22: IMPRESSION: No acute intracranial hemorrhage or evidence of evolving large vessel territory infarct. ASPECT score is 10.   MRI HEAD WITHOUT AND WITH CONTRAST on 12/06/22: IMPRESSION: 1. Punctate focus of restricted diffusion in the left parietal lobe is less conspicuous on today's study. 2. Additional cortical and subcortical T2 hyperintensities in the high left parietal lobe are again noted, consistent remote infarcts in this area. 3. No other acute intracranial abnormality or significant interval change.   COGNITION: Overall cognitive status: Impaired    COORDINATION: Slight finger to nose impairment with L arm, additionally some slight impairments in heel to shin with L leg.  POSTURE:  rounded shoulders  LOWER EXTREMITY MMT:    MMT Right Eval Left Eval  Hip flexion 4 4-  Hip extension    Hip abduction 4 4-  Hip adduction 4 4-  Hip internal rotation  Hip external rotation    Knee flexion 4 3+  Knee extension 4 4-  Ankle dorsiflexion 4 3+  Ankle plantarflexion    Ankle inversion    Ankle eversion    (Blank rows = not tested)  STAIRS: Level of Assistance: CGA Stair Negotiation Technique: Step to Pattern with Bilateral Rails Number of Stairs: 4  Height of Stairs: standard  Comments: performed very slowly, pt generally safe with activity, but could improve to alternating patter and decreased use of UE support.  GAIT: Gait pattern: decreased stride length, decreased hip/knee flexion- Left, and decreased ankle dorsiflexion- Left Distance walked: 756 Assistive device utilized: None Level of assistance: CGA Comments: Pt reports sensations of drifting/leaning to R side when walking. SPT also notes a physical drift to R side during straight walking paths  FUNCTIONAL TESTS:  5 times sit to stand: 16.67 sec 6 minute walk test: 756 feet 10 meter walk test: 15.41 sec; .65 m/s Dynamic Gait Index: Flowsheet     PATIENT SURVEYS:  FOTO 60  TODAY'S TREATMENT:                                                                                                                              DATE: 12/24/22  NMR: On airex pad: -Static standing x 30 sec -Static stand NBOS x 30 sec -Static stand NBOS 2 x 30 sec bouts of horizontal and vertical head turns  -SLS holds 2 x 30 sec each leg, pt sways often but has good postural reactions to correct and maintain balance. -Static stand NBOS 3 x 30 secswith eyes closed   Ambulation in hallway 12 x 85 feet consisting of: -2 laps variable Head turns up, down, left, right. -3 laps naming items on walls working on dual task head turns  -4 laps naming items of certain categories (I.e. items that are yellow, etc.) -3 laps with  serial reverse counting down by 3s from a random number.   -Activity Description: Blaze foot taps at stairs, 2 pods on stairs, 2 pods at each side of pt Activity Setting: The [facility] Focus setting was selected to refine precision and concentration, isolating specific muscle groups or movements to enhance overall coordination and targeted muscle engagement.  Number of Pods:  4 Cycles/Sets:  3 sets Duration (Time or Hit Count):  20 hits  -Activity Description: Blaze foot taps at stairs standing on airex pad, no use of UE. 2 pods on stairs, 2 pods at each side of pt Activity Setting: The [facility] Focus setting was selected to refine precision and concentration, isolating specific muscle groups or movements to enhance overall coordination and targeted muscle engagement.  Number of Pods:  4 Cycles/Sets:  3 sets, all three done with cognitive motor dual tasks. Duration (Time or Hit Count):  25 hits  Blood pressure assessed at end of session following pt's comments on most recent readings. Pt's pressure at end of session: 155/108. Pt educated to keep  a close eye on vitals for the rest of the day, and to not do any strenuous activity for the rest of the day    PATIENT EDUCATION: Education details: Educated on instruction for tests and measures, outcome measures, and POC. Also educated pt on reasons for general fatigue as it relates to her stroke Person educated: Patient Education method: Medical illustrator Education comprehension: verbalized understanding  HOME EXERCISE PROGRAM: Address in 2nd session  GOALS: Goals reviewed with patient? No  SHORT TERM GOALS: Target date: 01/13/2023    Patient will be independent in home exercise program to improve strength/mobility for better functional independence with ADLs. Baseline: No HEP currently  Goal status: INITIAL   LONG TERM GOALS: Target date: 03/10/2023   1.  Patient (> 71 years old) will complete five times sit to stand  test in < 15 seconds indicating an increased LE strength and improved balance. Baseline: 16.67 sec Goal status: INITIAL  2.  Patient will increase FOTO score to equal to or greater than  68   to demonstrate statistically significant improvement in mobility and quality of life.  Baseline: 60 Goal status: INITIAL   3.   Patient will increase DGI score by > 5  to reduce fall risk and demonstrate improved gait ability. Baseline: 11 Goal status: INITIAL  4.   Patient will increase 10 meter walk test to >1.19m/s as to improve gait speed for better community ambulation and to reduce fall risk. Baseline: .65 m/s Goal status: INITIAL  5.   Patient will increase six minute walk test distance to >1000 for progression to community ambulator and improve gait ability Baseline: 756 feet Goal status: INITIAL    ASSESSMENT:  CLINICAL IMPRESSION: Patient appeared motivated and ready for treatment on this day. Per pt's subjective she is feeling much better than previous session. Noting general fatigue is gone, dual tasking is doing much better at home. Overall, pt was able to tolerate all exercises given, showing improvements with all. Able to perform dual tasks at better capacity than before, as well as showing greater stability with static and dynamic balance exercises. Plan to monitor BP next session to ensure pt safety, as well as increase and progress pt's current exercises to provide greater challenge and cognitive load. Pt will continue to benefit from skilled physical therapy intervention to address impairments, improve QOL, and attain therapy goals.   OBJECTIVE IMPAIRMENTS: Abnormal gait, decreased activity tolerance, decreased balance, decreased coordination, decreased endurance, and decreased strength.   ACTIVITY LIMITATIONS: stairs and locomotion level  PARTICIPATION LIMITATIONS: driving, community activity, and occupation  PERSONAL FACTORS: Past/current experiences and Time since onset of  injury/illness/exacerbation are also affecting patient's functional outcome.   REHAB POTENTIAL: Good  CLINICAL DECISION MAKING: Evolving/moderate complexity  EVALUATION COMPLEXITY: Moderate  PLAN:  PT FREQUENCY: 1-2x/week  PT DURATION: 12 weeks  PLANNED INTERVENTIONS: Therapeutic exercises, Therapeutic activity, Neuromuscular re-education, Balance training, Gait training, Patient/Family education, Self Care, Joint mobilization, Stair training, Cryotherapy, Moist heat, and Re-evaluation  PLAN FOR NEXT SESSION: ;adjust HEP as needed, assess BP when needed .Balance and coordination training. Strengthening. Dual tasks interventions when applicable.   Nani Gasser, Student-PT 12/24/2022, 3:58 PM  This entire session was performed under direct supervision and direction of a licensed therapist/therapist assistant . I have personally read, edited and approve of the note as written.    This licensed clinician was present and actively directing care throughout the session at all times.  Norman Herrlich PT ,DPT Physical Therapist- [place]  [facility]

## 2022-12-29 ENCOUNTER — Encounter: Payer: 59 | Admitting: Speech Pathology

## 2022-12-30 ENCOUNTER — Ambulatory Visit: Payer: 59

## 2022-12-30 ENCOUNTER — Encounter: Payer: Self-pay | Admitting: Internal Medicine

## 2022-12-30 ENCOUNTER — Ambulatory Visit: Payer: 59 | Admitting: Internal Medicine

## 2022-12-30 ENCOUNTER — Telehealth: Payer: Self-pay | Admitting: Internal Medicine

## 2022-12-30 VITALS — BP 136/100 | HR 81 | Temp 97.7°F | Ht 64.0 in | Wt 222.0 lb

## 2022-12-30 DIAGNOSIS — I10 Essential (primary) hypertension: Secondary | ICD-10-CM

## 2022-12-30 DIAGNOSIS — R2689 Other abnormalities of gait and mobility: Secondary | ICD-10-CM

## 2022-12-30 DIAGNOSIS — F319 Bipolar disorder, unspecified: Secondary | ICD-10-CM | POA: Diagnosis not present

## 2022-12-30 DIAGNOSIS — R278 Other lack of coordination: Secondary | ICD-10-CM

## 2022-12-30 DIAGNOSIS — Z8673 Personal history of transient ischemic attack (TIA), and cerebral infarction without residual deficits: Secondary | ICD-10-CM | POA: Diagnosis not present

## 2022-12-30 DIAGNOSIS — M6281 Muscle weakness (generalized): Secondary | ICD-10-CM

## 2022-12-30 DIAGNOSIS — R2681 Unsteadiness on feet: Secondary | ICD-10-CM | POA: Diagnosis not present

## 2022-12-30 DIAGNOSIS — R262 Difficulty in walking, not elsewhere classified: Secondary | ICD-10-CM

## 2022-12-30 MED ORDER — LISINOPRIL-HYDROCHLOROTHIAZIDE 20-12.5 MG PO TABS: 1.0000 | ORAL_TABLET | Freq: Every day | ORAL | 3 refills | Status: AC

## 2022-12-30 NOTE — Therapy (Signed)
OUTPATIENT PHYSICAL THERAPY NEURO TREATMENT   Patient Name: Bridget Maldonado MRN: [id number] DOB:08/04/1980, 42 y.o., female Today's Date: 12/30/2022   PCP: Karie Schwalbe, MD  REFERRING PROVIDER: Karie Schwalbe, MD   END OF SESSION:  PT End of Session - 12/30/22 1406     Visit Number 4    Number of Visits 24    Date for PT Re-Evaluation 03/10/23    Progress Note Due on Visit 10    PT Start Time 1402    Equipment Utilized During Treatment Gait belt    Activity Tolerance Patient tolerated treatment well    Behavior During Therapy [facility] for tasks assessed/performed               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   Increased risk of breast cancer 07/2017   IBIS=40%   Kidney infection    Pylonephritis when pregnant   LGSIL on Pap smear of cervix    Migraines    Obese    Psoriatic arthritis (HCC)    Secondary oligomenorrhea 07/16/2017   Past Surgical History:  Procedure Laterality Date   BREAST BIOPSY Left 8 years   CESAREAN SECTION     COLONOSCOPY     COLONOSCOPY W/ BIOPSIES     CYSTOSCOPY N/A 03/04/2022   Procedure: CYSTOSCOPY;  Surgeon: [place] Bing, MD;  Location: [facility] OR;  Service: Gynecology;  Laterality: N/A;   EAR TUBE REMOVAL     ENDOMETRIAL BIOPSY  12/23/2021   ESOPHAGOGASTRODUODENOSCOPY     LAPAROSCOPIC CHOLECYSTECTOMY  6/09   Dr. Lindie Spruce   TOTAL LAPAROSCOPIC HYSTERECTOMY WITH SALPINGECTOMY Bilateral 03/04/2022   Procedure: TOTAL LAPAROSCOPIC HYSTERECTOMY WITH SALPINGECTOMY;  Surgeon: [place] Bing, MD;  [facility]: 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   Family history of breast cancer 07/16/2017   Psoriatic arthritis (HCC) 10/28/2016   Chronic low back pain 10/28/2016   Essential hypertension 10/18/2014   Arthropathia 11/24/2013   Obstipation 04/12/2013   Migraine with status migrainosus 09/08/2012   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   Mild persistent asthma 05/26/2011   Severe anxiety with panic 08/29/2006   Panic disorder without agoraphobia 08/29/2006    ONSET DATE: 12/04/2022  REFERRING DIAG: Cerebral infarction, unspecified  THERAPY DIAG:  Unsteadiness on feet  Other abnormalities of gait and mobility  Other lack of coordination  Difficulty in walking, not elsewhere classified  Muscle weakness (generalized)  Rationale for Evaluation and Treatment: Rehabilitation  SUBJECTIVE:  SUBJECTIVE STATEMENT: Patient reports doing really well- States went back to work without issues. States feels like her physical abilities have returned back to normal and states no further speech or physical issues. She reports no difficulty with multitasking or perform home duties as mother of 37 year old. States she does not feel like she requires any further PT after today and agreeable to reassessment and possible discharge if test improved.     Pt accompanied by: self  PERTINENT HISTORY: Per PT Acute Care Evaluation on 12/06/22: "42 year old female presenting with acute onset of left sided weakness and numbness with left facial droop.Marland KitchenMarland KitchenPt reports non-dermatomal pattern of diminished sensation and decreased strength on LUE and LLE in comparison to RUE and RLE. Pt demonstrates mild coordination deficits in LLE and LUE. Most noticeable with ambulation; L foot drag, decreased step length/height bilaterally, and decreased gait velocity noted." Pt now walking without AD on initial evaluation. PAIN:  Are you having pain? Yes, Intermittent stinging/quick pains in left arm and leg. Seldom occurs now and not an issue.  PRECAUTIONS: Fall  WEIGHT BEARING RESTRICTIONS: No  FALLS: Has patient fallen in last 6 months? No  LIVING ENVIRONMENT: Lives with: lives with their family Lives in: House/apartment Stairs: Yes: External: 4-5 steps; on right going up, on left going up, and can reach both Has following equipment at home: None  PLOF: Independent  PATIENT GOALS: Pt wants to drive independently, also wants to get back to work.  OBJECTIVE:   DIAGNOSTIC FINDINGS:  CT HEAD WITHOUT CONTRAST on 12/04/22: IMPRESSION: No acute intracranial hemorrhage or evidence of evolving large vessel territory infarct. ASPECT score is 10.   MRI HEAD WITHOUT AND WITH CONTRAST on 12/06/22: IMPRESSION: 1. Punctate focus of restricted diffusion in the left parietal lobe is less conspicuous on today's study. 2. Additional cortical and subcortical T2 hyperintensities in the high left parietal lobe are again noted, consistent remote infarcts in this area. 3. No other acute intracranial abnormality or significant interval change.   COGNITION: Overall cognitive status: Impaired    COORDINATION: Slight finger to nose impairment with L arm, additionally some slight impairments in heel to shin with L leg.  POSTURE: rounded shoulders  LOWER EXTREMITY MMT:     MMT Right Eval Left Eval  Hip flexion 4 4-  Hip extension    Hip abduction 4 4-  Hip adduction 4 4-  Hip internal rotation    Hip external rotation    Knee flexion 4 3+  Knee extension 4 4-  Ankle dorsiflexion 4 3+  Ankle plantarflexion    Ankle inversion    Ankle eversion    (Blank rows = not tested)  STAIRS: Level of Assistance: CGA Stair Negotiation Technique: Step to Pattern with Bilateral Rails Number of Stairs: 4  Height of Stairs: standard  Comments: performed very slowly, pt generally safe with activity, but could improve to alternating patter and decreased use of UE support.  GAIT: Gait pattern: decreased stride length, decreased hip/knee flexion- Left, and decreased ankle dorsiflexion- Left Distance walked: 756 Assistive device utilized: None Level of assistance: CGA Comments: Pt reports sensations of drifting/leaning to R side when walking. SPT also notes a physical drift to R side during straight walking paths  FUNCTIONAL TESTS:  5 times sit to stand: 16.67 sec 6 minute walk test: 756 feet 10 meter walk test: 15.41 sec; .65 m/s Dynamic Gait Index: Flowsheet     PATIENT SURVEYS:  FOTO 60  TODAY'S TREATMENT:  DATE: 12/30/22  Physical therapy treatment session today consisted of completing assessment of goals and administration of testing as demonstrated and documented in flow sheet, treatment, and goals section of this note. Addition treatments may be found below.   PATIENT EDUCATION: Education details: Educated on instruction for tests and measures, outcome measures, and POC. Also educated pt on reasons for general fatigue as it relates to her stroke Person educated: Patient Education method: Medical illustrator Education comprehension: verbalized understanding  HOME EXERCISE PROGRAM: Address in 2nd  session  GOALS: Goals reviewed with patient? No  SHORT TERM GOALS: Target date: 01/13/2023    Patient will be independent in home exercise program to improve strength/mobility for better functional independence with ADLs. Baseline: No HEP currently; Patient reports returned to all normal activities and no prescribed HEP issued as of today.  Goal status: Not appropriate  LONG TERM GOALS: Target date: 03/10/2023   1.  Patient (> 39 years old) will complete five times sit to stand test in < 15 seconds indicating an increased LE strength and improved balance. Baseline: 16.67 sec; 12/30/2022= 6.01 sec Goal status: Met   2.  Patient will increase FOTO score to equal to or greater than  68   to demonstrate statistically significant improvement in mobility and quality of life.  Baseline: 60; 12/30/2022= 98% Goal status: MET   3.   Patient will increase DGI score by > 5  to reduce fall risk and demonstrate improved gait ability. Baseline: 11; 7/9/202=24/24 Goal status: MET  4.   Patient will increase 10 meter walk test to >1.70m/s as to improve gait speed for better community ambulation and to reduce fall risk. Baseline: .65 m/s; 12/30/2022= 1.2 m/s Goal status: MET  5.   Patient will increase six minute walk test distance to >1000 for progression to community ambulator and improve gait ability Baseline: 756 feet; 12/30/2022= 1400 feet  Goal status: MET    ASSESSMENT:  CLINICAL IMPRESSION: Patient arrived with excellent motivation today and reported feeling much better and back to baseline with mobility. She was agreeable to reassessment of goals. Patient presented with marked improved in all functional goals. She met her FOTO goal indicating an improvement in her self perceived functional abilities related to deficits in LE from CVA. She also vastly improved with her LE strength as seen by improved 5 time Sit to stand test and perfect score on DGI indicating a significant improvement in her  overall balance. She nearly doubled her gait distance with 6 min walk test and presents with independent mobility. She has met all her functional goals and appropriate for discharge today. Patient in agreement with plan.   OBJECTIVE IMPAIRMENTS: Abnormal gait, decreased activity tolerance, decreased balance, decreased coordination, decreased endurance, and decreased strength.   ACTIVITY LIMITATIONS: stairs and locomotion level  PARTICIPATION LIMITATIONS: driving, community activity, and occupation  PERSONAL FACTORS: Past/current experiences and Time since onset of injury/illness/exacerbation are also affecting patient's functional outcome.   REHAB POTENTIAL: Good  CLINICAL DECISION MAKING: Evolving/moderate complexity  EVALUATION COMPLEXITY: Moderate  PLAN:  PT FREQUENCY: 1-2x/week  PT DURATION: 12 weeks  PLANNED INTERVENTIONS: Therapeutic exercises, Therapeutic activity, Neuromuscular re-education, Balance training, Gait training, Patient/Family education, Self Care, Joint mobilization, Stair training, Cryotherapy, Moist heat, and Re-evaluation  PLAN FOR NEXT SESSION: Discharge patient with goals met today.    Lenda Kelp, PT 12/30/2022, 2:07 PM  Physical Therapist- [place]  [facility]

## 2022-12-30 NOTE — Assessment & Plan Note (Signed)
Irritability, etc has resolved Likely due to improved neuro status and time from the CVA---increased duloxetine 60mg  daily may have helped (so will continue this)

## 2022-12-30 NOTE — Telephone Encounter (Signed)
Patient dropped off document FMLA, to be filled out by provider. Patient requested to send it back via Fax within 7-days. Document is located in providers tray at front office.Please advise at Mobile [phone] (mobile) when finished. Thank you

## 2022-12-30 NOTE — Assessment & Plan Note (Signed)
Neuro exam normalized Back to work and driving Continue ASA 81 and atorvastatin 20 indefinitely

## 2022-12-30 NOTE — Assessment & Plan Note (Signed)
BP Readings from Last 3 Encounters:  12/30/22 (!) 136/100  12/15/22 136/88  12/09/22 136/86   Now quite high Will change to lisinopril/hydrochlorothiazide 20/12.5 Recheck 2 weeks ---she will bring her machine

## 2022-12-30 NOTE — Progress Notes (Signed)
Subjective:    Patient ID: Bridget Maldonado, female    DOB: 31-Jul-1980, 42 y.o.   MRN: [id number]  HPI Here for follow up after stroke--and BP is elevated  Did see neurologist--steroid pack didn't hep headache BP consistently high--might be related to this As high as 168/126 on her measure  Weakness has resolved Started working again [employer], etc  No chest pain or SOB No dizziness or syncope No edema  Mood is better Irritability is improved---doing okay with increased duloxetine  Current Outpatient Medications on File Prior to Visit  Medication Sig Dispense Refill   albuterol (PROVENTIL HFA;VENTOLIN HFA) 108 (90 Base) MCG/ACT inhaler Inhale 2 puffs into the lungs every 6 (six) hours as needed for wheezing or shortness of breath. 1 Inhaler 1   ALPRAZolam (XANAX) 0.25 MG tablet Take 1 tablet (0.25 mg total) by mouth 3 (three) times daily as needed for anxiety. 60 tablet 0   aspirin 81 MG chewable tablet Chew 1 tablet (81 mg total) by mouth daily. 30 tablet 0   atorvastatin (LIPITOR) 20 MG tablet Take 1 tablet (20 mg total) by mouth daily. 30 tablet 0   butalbital-acetaminophen-caffeine (FIORICET) 50-325-40 MG tablet Take 1 tablet by mouth daily as needed for headache. 14 tablet 0   DULoxetine (CYMBALTA) 60 MG capsule Take 1 capsule (60 mg total) by mouth daily. 30 capsule 3   lisinopril (ZESTRIL) 5 MG tablet Take 1 tablet (5 mg total) by mouth daily. 30 tablet 0   mometasone (ELOCON) 0.1 % ointment APPLY TWICE DAILY AS NEEDED FOR PSORIASIS 45 g 1   montelukast (SINGULAIR) 10 MG tablet Take 1 tablet (10 mg total) by mouth at bedtime. 90 tablet 3   traMADol (ULTRAM) 50 MG tablet Take 1 tablet (50 mg total) by mouth 2 (two) times daily as needed. 30 tablet 0   No current facility-administered medications on file prior to visit.    Allergies  Allergen Reactions   Sulfa Antibiotics Nausea And Vomiting   Cipro [Ciprofloxacin Hcl] Nausea And Vomiting   Effexor  [Venlafaxine Hcl] Other (See Comments)    Excessive sleeping, slept for 2 days    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   Increased risk of breast cancer 07/2017   IBIS=40%   Kidney infection    Pylonephritis when pregnant   LGSIL on Pap smear of cervix    Migraines    Obese    Psoriatic arthritis (HCC)    Secondary oligomenorrhea 07/16/2017    Past Surgical History:  Procedure Laterality Date   BREAST BIOPSY Left 8 years   CESAREAN SECTION     COLONOSCOPY     COLONOSCOPY W/ BIOPSIES     CYSTOSCOPY N/A 03/04/2022   Procedure: CYSTOSCOPY;  Surgeon: [place] Bing, MD;  Location: [facility] OR;  Service: Gynecology;  Laterality: N/A;   EAR TUBE REMOVAL     ENDOMETRIAL BIOPSY  12/23/2021   ESOPHAGOGASTRODUODENOSCOPY     LAPAROSCOPIC CHOLECYSTECTOMY  6/09   Dr. Lindie Spruce   TOTAL LAPAROSCOPIC HYSTERECTOMY  WITH SALPINGECTOMY Bilateral 03/04/2022   Procedure: TOTAL LAPAROSCOPIC HYSTERECTOMY WITH SALPINGECTOMY;  Surgeon: [place] Bing, MD;  Location: [facility] OR;  Service: Gynecology;  Laterality: Bilateral;   TUBAL LIGATION  2017   TYMPANOSTOMY TUBE PLACEMENT      Family History  Problem Relation Age of Onset   Colon polyps Maternal Grandfather    Parkinson's disease Maternal Grandfather    Breast cancer Mother 41   Rheum arthritis Mother    Irritable bowel syndrome Mother    Asthma Father    Anxiety disorder Father        Severe, possible bipolar   Irritable bowel syndrome Maternal Grandmother    Diabetes Other        mat. great uncles   Alzheimer's disease Other        Dad's side    Colon cancer Other        mat GGF    Social History   Socioeconomic History   Marital status: Legally Separated    Spouse name: Not on file   Number of children: 1   Years of education: Not on file   Highest education level: Not on file  Occupational History   Occupation: Accounting    Comment: [employer]  Tobacco Use   Smoking status: Never    Passive exposure: Past   Smokeless tobacco: Never  Vaping Use   Vaping Use: Never used  Substance and Sexual Activity   Alcohol use: No    Alcohol/week: 0.0 standard drinks of alcohol   Drug use: No   Sexual activity: Yes    Birth control/protection: Surgical  Other Topics Concern   Not on file  Social History Narrative   Married 10/22   Social Determinants of Health   Financial Resource Strain: Not on file  Food Insecurity: No Food Insecurity (12/09/2022)   Hunger Vital Sign    Worried About Running Out of Food in the Last Year: Never true    Ran Out of Food in the Last Year: Never true  Transportation Needs: Not on file  Physical Activity: Not on file  Stress: Not on file  Social Connections: Not on file  Intimate Partner Violence: Not on file   Review of Systems Not sleeping well--nothing new Appetite is okay Weight is stable     Objective:   Physical Exam Constitutional:      Appearance: Normal appearance.  Cardiovascular:     Rate and Rhythm: Normal rate and regular rhythm.     Heart sounds: No murmur heard.    No gallop.  Pulmonary:     Effort: Pulmonary effort is normal.     Breath sounds: Normal breath sounds. No wheezing or rales.  Musculoskeletal:     Cervical back: Neck supple.     Right lower leg: No edema.     Left lower leg: No edema.  Lymphadenopathy:     Cervical: No cervical adenopathy.  Neurological:     General: No focal deficit present.     Mental Status: She is alert.  Psychiatric:        Mood and Affect: Mood normal.        Behavior: Behavior normal.            Assessment & Plan:

## 2022-12-31 NOTE — Telephone Encounter (Signed)
Spoke to pt. It is retro for the time she missed from ter Stroke. She worked half a day on 12-04-22 (day of the Stroke) until 12-28-22. She returned to work 12-29-22.

## 2022-12-31 NOTE — Telephone Encounter (Signed)
Forms placed in Dr Letvak's inbox on his desk. 

## 2023-01-01 ENCOUNTER — Ambulatory Visit: Payer: 59

## 2023-01-01 ENCOUNTER — Encounter: Payer: Self-pay | Admitting: Internal Medicine

## 2023-01-01 MED ORDER — TRAMADOL HCL 50 MG PO TABS: 50.0000 mg | ORAL_TABLET | Freq: Two times a day (BID) | ORAL | 0 refills | Status: DC | PRN

## 2023-01-01 NOTE — Telephone Encounter (Signed)
Pt requested refill on MyChart this morning. I sent her a message about the missing pages.

## 2023-01-01 NOTE — Telephone Encounter (Signed)
Entire form received and put in Dr Karle Starch inbox on his desk

## 2023-01-01 NOTE — Telephone Encounter (Signed)
Last filled 11-27-22 #30 Last OV 12-30-22 Next OV 01-20-23 [facility]

## 2023-01-06 ENCOUNTER — Ambulatory Visit: Payer: 59 | Admitting: Physical Therapy

## 2023-01-06 ENCOUNTER — Ambulatory Visit: Payer: 59 | Admitting: Speech Pathology

## 2023-01-07 ENCOUNTER — Ambulatory Visit: Payer: 59

## 2023-01-08 ENCOUNTER — Ambulatory Visit: Payer: 59

## 2023-01-08 ENCOUNTER — Encounter: Payer: 59 | Admitting: Speech Pathology

## 2023-01-09 ENCOUNTER — Other Ambulatory Visit: Payer: Self-pay | Admitting: Internal Medicine

## 2023-01-13 ENCOUNTER — Encounter: Payer: 59 | Admitting: Speech Pathology

## 2023-01-13 ENCOUNTER — Ambulatory Visit: Payer: 59 | Admitting: Physical Therapy

## 2023-01-14 ENCOUNTER — Ambulatory Visit: Payer: 59

## 2023-01-15 ENCOUNTER — Ambulatory Visit: Payer: 59

## 2023-01-20 ENCOUNTER — Ambulatory Visit: Payer: 59 | Admitting: Internal Medicine

## 2023-01-20 ENCOUNTER — Encounter: Payer: Self-pay | Admitting: Obstetrics and Gynecology

## 2023-01-20 ENCOUNTER — Ambulatory Visit: Payer: 59

## 2023-01-21 ENCOUNTER — Encounter: Payer: 59 | Admitting: Speech Pathology

## 2023-01-21 ENCOUNTER — Encounter (INDEPENDENT_AMBULATORY_CARE_PROVIDER_SITE_OTHER): Payer: Self-pay

## 2023-01-21 ENCOUNTER — Ambulatory Visit
Admission: RE | Admit: 2023-01-21 | Discharge: 2023-01-21 | Disposition: A | Discharge status: Home / Self Care | Payer: 59 | Source: Ambulatory Visit | Attending: Internal Medicine | Admitting: Internal Medicine

## 2023-01-21 DIAGNOSIS — Z1231 Encounter for screening mammogram for malignant neoplasm of breast: Secondary | ICD-10-CM

## 2023-01-22 ENCOUNTER — Ambulatory Visit: Payer: 59

## 2023-01-26 ENCOUNTER — Other Ambulatory Visit: Payer: Self-pay | Admitting: Internal Medicine

## 2023-01-26 DIAGNOSIS — R928 Other abnormal and inconclusive findings on diagnostic imaging of breast: Secondary | ICD-10-CM

## 2023-01-27 ENCOUNTER — Encounter: Payer: 59 | Admitting: Speech Pathology

## 2023-01-27 ENCOUNTER — Ambulatory Visit: Payer: 59

## 2023-01-29 ENCOUNTER — Encounter: Payer: 59 | Admitting: Speech Pathology

## 2023-01-29 ENCOUNTER — Ambulatory Visit: Payer: 59

## 2023-01-30 ENCOUNTER — Ambulatory Visit
Admission: RE | Admit: 2023-01-30 | Discharge: 2023-01-30 | Disposition: A | Discharge status: Home / Self Care | Payer: No Typology Code available for payment source | Source: Ambulatory Visit | Attending: Internal Medicine | Admitting: Internal Medicine

## 2023-01-30 DIAGNOSIS — R928 Other abnormal and inconclusive findings on diagnostic imaging of breast: Secondary | ICD-10-CM

## 2023-02-02 ENCOUNTER — Ambulatory Visit: Payer: 59

## 2023-02-02 ENCOUNTER — Encounter: Payer: 59 | Admitting: Speech Pathology

## 2023-02-04 ENCOUNTER — Encounter: Payer: 59 | Admitting: Speech Pathology

## 2023-02-04 ENCOUNTER — Ambulatory Visit: Payer: 59

## 2023-02-09 ENCOUNTER — Ambulatory Visit: Payer: 59

## 2023-02-09 ENCOUNTER — Encounter: Payer: 59 | Admitting: Speech Pathology

## 2023-02-09 ENCOUNTER — Other Ambulatory Visit: Payer: Self-pay | Admitting: Internal Medicine

## 2023-02-09 MED ORDER — TRAMADOL HCL 50 MG PO TABS: 50.0000 mg | ORAL_TABLET | Freq: Two times a day (BID) | ORAL | 0 refills | Status: DC | PRN

## 2023-02-09 NOTE — Telephone Encounter (Signed)
Last filled 01-01-23 #30 Last OV 12-30-22 Next OV 02-11-23 [facility]

## 2023-02-11 ENCOUNTER — Encounter: Payer: 59 | Admitting: Speech Pathology

## 2023-02-11 ENCOUNTER — Ambulatory Visit: Payer: 59

## 2023-02-11 ENCOUNTER — Encounter: Payer: Self-pay | Admitting: Internal Medicine

## 2023-02-11 ENCOUNTER — Ambulatory Visit (INDEPENDENT_AMBULATORY_CARE_PROVIDER_SITE_OTHER): Payer: No Typology Code available for payment source | Admitting: Internal Medicine

## 2023-02-11 VITALS — BP 110/64 | HR 84 | Temp 97.6°F | Ht 64.0 in | Wt 226.0 lb

## 2023-02-11 DIAGNOSIS — F319 Bipolar disorder, unspecified: Secondary | ICD-10-CM

## 2023-02-11 DIAGNOSIS — I10 Essential (primary) hypertension: Secondary | ICD-10-CM | POA: Diagnosis not present

## 2023-02-11 DIAGNOSIS — G43009 Migraine without aura, not intractable, without status migrainosus: Secondary | ICD-10-CM | POA: Diagnosis not present

## 2023-02-11 DIAGNOSIS — Z8673 Personal history of transient ischemic attack (TIA), and cerebral infarction without residual deficits: Secondary | ICD-10-CM | POA: Diagnosis not present

## 2023-02-11 LAB — RENAL FUNCTION PANEL
Albumin: 3.8 g/dL (ref 3.5–5.2)
BUN: 16 mg/dL (ref 6–23)
CO2: 30 mEq/L (ref 19–32)
Calcium: 9 mg/dL (ref 8.4–10.5)
Chloride: 100 mEq/L (ref 96–112)
Creatinine, Ser: 0.91 mg/dL (ref 0.40–1.20)
GFR: 79.15 mL/min (ref >60.00)
Glucose, Bld: 139 mg/dL — ABNORMAL HIGH (ref 70–99)
Phosphorus: 2.9 mg/dL (ref 2.3–4.6)
Potassium: 4 mEq/L (ref 3.5–5.1)
Sodium: 138 mEq/L (ref 135–145)

## 2023-02-11 MED ORDER — ALPRAZOLAM 0.25 MG PO TABS: 0.2500 mg | ORAL_TABLET | Freq: Three times a day (TID) | ORAL | 0 refills | Status: DC | PRN

## 2023-02-11 NOTE — Progress Notes (Signed)
Subjective:    Patient ID: Bridget Maldonado, female    DOB: 1980-09-25, 42 y.o.   MRN: [id number]  HPI Here for follow up after stroke--and HTN/headaches  Did have bad migraine 4 days ago Had some trouble with speech like with her stroke----lasted 5 minutes Went into hot shower and took fioricet Able to get back to sleep---and then woke with "headache hangover" feeling This was first HA since stroke BP high then (171/131)  Has checked BP at other times [place] beach with [place]-- 140/100 Using meds daily No chest pain or SOB Normal activity levels and working  Current Outpatient Medications on File Prior to Visit  Medication Sig Dispense Refill   albuterol (PROVENTIL HFA;VENTOLIN HFA) 108 (90 Base) MCG/ACT inhaler Inhale 2 puffs into the lungs every 6 (six) hours as needed for wheezing or shortness of breath. 1 Inhaler 1   ALPRAZolam (XANAX) 0.25 MG tablet Take 1 tablet (0.25 mg total) by mouth 3 (three) times daily as needed for anxiety. 60 tablet 0   atorvastatin (LIPITOR) 20 MG tablet TAKE ONE TABLET (20 MG TOTAL) BY MOUTH DAILY. 30 tablet 11   butalbital-acetaminophen-caffeine (FIORICET) 50-325-40 MG tablet Take 1 tablet by mouth daily as needed for headache. 14 tablet 0   DULoxetine (CYMBALTA) 60 MG capsule Take 1 capsule (60 mg total) by mouth daily. 30 capsule 3   lisinopril-hydrochlorothiazide (ZESTORETIC) 20-12.5 MG tablet Take 1 tablet by mouth daily. 90 tablet 3   mometasone (ELOCON) 0.1 % ointment APPLY TWICE DAILY AS NEEDED FOR PSORIASIS 45 g 1   montelukast (SINGULAIR) 10 MG tablet Take 1 tablet (10 mg total) by mouth at bedtime. 90 tablet 3   traMADol (ULTRAM) 50 MG tablet Take 1 tablet (50 mg total) by mouth 2 (two) times daily as needed. 30 tablet 0   No current facility-administered medications on file prior to visit.    Allergies  Allergen Reactions   Sulfa Antibiotics Nausea And Vomiting   Cipro [Ciprofloxacin Hcl] Nausea And Vomiting   Effexor  [Venlafaxine Hcl] Other (See Comments)    Excessive sleeping, slept for 2 days    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   Increased risk of breast cancer 07/2017   IBIS=40%   Kidney infection    Pylonephritis when pregnant   LGSIL on Pap smear of cervix    Migraines    Obese    Psoriatic arthritis (HCC)    Secondary oligomenorrhea 07/16/2017    Past Surgical History:  Procedure Laterality Date   BREAST BIOPSY Left 8 years   CESAREAN SECTION     COLONOSCOPY     COLONOSCOPY W/ BIOPSIES     CYSTOSCOPY N/A 03/04/2022   Procedure: CYSTOSCOPY;  Surgeon: [place] Bing, MD;  Location: [facility] OR;  Service: Gynecology;  Laterality: N/A;   EAR TUBE REMOVAL     ENDOMETRIAL BIOPSY  12/23/2021   ESOPHAGOGASTRODUODENOSCOPY     LAPAROSCOPIC CHOLECYSTECTOMY  6/09   Dr. Lindie Spruce   TOTAL LAPAROSCOPIC HYSTERECTOMY WITH SALPINGECTOMY Bilateral 03/04/2022  Procedure: TOTAL LAPAROSCOPIC HYSTERECTOMY WITH SALPINGECTOMY;  Surgeon: [place] Bing, MD;  Location: [facility] OR;  Service: Gynecology;  Laterality: Bilateral;   TUBAL LIGATION  2017   TYMPANOSTOMY TUBE PLACEMENT      Family History  Problem Relation Age of Onset   Colon polyps Maternal Grandfather    Parkinson's disease Maternal Grandfather    Breast cancer Mother 6   Rheum arthritis Mother    Irritable bowel syndrome Mother    Asthma Father    Anxiety disorder Father        Severe, possible bipolar   Irritable bowel syndrome Maternal Grandmother    Diabetes Other        mat. great uncles   Alzheimer's disease Other        Dad's side    Colon cancer Other        mat GGF    Social History   Socioeconomic History   Marital status: Legally Separated    Spouse name: Not on file   Number of children: 1   Years of education: Not on file   Highest education level: Not on file  Occupational History   Occupation: Accounting    Comment: [employer]  Tobacco Use   Smoking status: Never    Passive exposure: Past   Smokeless tobacco: Never  Vaping Use   Vaping status: Never Used  Substance and Sexual Activity   Alcohol use: No    Alcohol/week: 0.0 standard drinks of alcohol   Drug use: No   Sexual activity: Yes    Birth control/protection: Surgical  Other Topics Concern   Not on file  Social History Narrative   Married 10/22   Social Determinants of Health   Financial Resource Strain: Not on file  Food Insecurity: No Food Insecurity (12/09/2022)   Hunger Vital Sign    Worried About Running Out of Food in the Last Year: Never true    Ran Out of Food in the Last Year: Never true  Transportation Needs: Not on file  Physical Activity: Not on file  Stress: Not on file  Social Connections: Not on file  Intimate Partner Violence: Not on file   Review of Systems Sleeps fairly well Has reduced caffeine considerably Uses the xanax intermittently---did have spell of stress needing it more (stress with daughter and school)    Objective:   Physical Exam Constitutional:      Appearance: Normal appearance.  Cardiovascular:     Rate and Rhythm: Normal rate and regular rhythm.     Heart sounds: No murmur heard.    No gallop.  Pulmonary:     Effort: Pulmonary effort is normal.     Breath sounds: Normal breath sounds. No wheezing or rales.  Musculoskeletal:     Cervical back: Neck supple.     Right lower leg: No edema.     Left lower leg: No edema.  Lymphadenopathy:     Cervical: No cervical adenopathy.  Neurological:     Mental Status: She is alert.            Assessment & Plan:

## 2023-02-11 NOTE — Assessment & Plan Note (Signed)
Overall better but still some anxiety with situations (like with dealing with daughter's school) On duloxetine 60, xanax prn

## 2023-02-11 NOTE — Assessment & Plan Note (Signed)
Fioricet prn 

## 2023-02-11 NOTE — Assessment & Plan Note (Signed)
BP Readings from Last 3 Encounters:  02/11/23 110/64  12/30/22 (!) 136/100  12/15/22 136/88   Much better Continue the lisinopril/hydrochlorothiazide 20/12.5 Will check labs

## 2023-02-11 NOTE — Assessment & Plan Note (Signed)
Had mild symptoms during migraine---discussed this is not surprising On atorvastatin and ASA 81mg 

## 2023-02-16 ENCOUNTER — Ambulatory Visit: Payer: 59

## 2023-02-16 ENCOUNTER — Encounter: Payer: 59 | Admitting: Speech Pathology

## 2023-02-18 ENCOUNTER — Ambulatory Visit: Payer: 59

## 2023-02-18 ENCOUNTER — Ambulatory Visit: Payer: No Typology Code available for payment source | Admitting: Speech Pathology

## 2023-02-18 ENCOUNTER — Emergency Department (HOSPITAL_COMMUNITY): Payer: No Typology Code available for payment source

## 2023-02-18 ENCOUNTER — Emergency Department (HOSPITAL_COMMUNITY)
Admission: EM | Admit: 2023-02-18 | Discharge: 2023-02-18 | Disposition: A | Discharge status: Home / Self Care | Payer: No Typology Code available for payment source | Attending: Emergency Medicine | Admitting: Emergency Medicine

## 2023-02-18 ENCOUNTER — Other Ambulatory Visit: Payer: Self-pay

## 2023-02-18 DIAGNOSIS — Y900 Blood alcohol level of less than 20 mg/100 ml: Secondary | ICD-10-CM | POA: Insufficient documentation

## 2023-02-18 DIAGNOSIS — R519 Headache, unspecified: Secondary | ICD-10-CM | POA: Diagnosis present

## 2023-02-18 DIAGNOSIS — I10 Essential (primary) hypertension: Secondary | ICD-10-CM | POA: Diagnosis not present

## 2023-02-18 DIAGNOSIS — Z7951 Long term (current) use of inhaled steroids: Secondary | ICD-10-CM | POA: Diagnosis not present

## 2023-02-18 DIAGNOSIS — Z79899 Other long term (current) drug therapy: Secondary | ICD-10-CM | POA: Diagnosis not present

## 2023-02-18 DIAGNOSIS — Z8673 Personal history of transient ischemic attack (TIA), and cerebral infarction without residual deficits: Secondary | ICD-10-CM | POA: Diagnosis not present

## 2023-02-18 DIAGNOSIS — R4781 Slurred speech: Secondary | ICD-10-CM | POA: Insufficient documentation

## 2023-02-18 DIAGNOSIS — F1310 Sedative, hypnotic or anxiolytic abuse, uncomplicated: Secondary | ICD-10-CM | POA: Insufficient documentation

## 2023-02-18 DIAGNOSIS — Z7982 Long term (current) use of aspirin: Secondary | ICD-10-CM | POA: Insufficient documentation

## 2023-02-18 DIAGNOSIS — F447 Conversion disorder with mixed symptom presentation: Secondary | ICD-10-CM | POA: Diagnosis not present

## 2023-02-18 DIAGNOSIS — R531 Weakness: Secondary | ICD-10-CM | POA: Insufficient documentation

## 2023-02-18 DIAGNOSIS — G43809 Other migraine, not intractable, without status migrainosus: Secondary | ICD-10-CM | POA: Diagnosis not present

## 2023-02-18 DIAGNOSIS — J45909 Unspecified asthma, uncomplicated: Secondary | ICD-10-CM | POA: Diagnosis not present

## 2023-02-18 LAB — COMPREHENSIVE METABOLIC PANEL
ALT: 59 U/L — ABNORMAL HIGH (ref 0–44)
AST: 40 U/L (ref 15–41)
Albumin: 3.3 g/dL — ABNORMAL LOW (ref 3.5–5.0)
Alkaline Phosphatase: 133 U/L — ABNORMAL HIGH (ref 38–126)
Anion gap: 8 (ref 5–15)
BUN: 13 mg/dL (ref 6–20)
CO2: 25 mmol/L (ref 22–32)
Calcium: 8.9 mg/dL (ref 8.9–10.3)
Chloride: 104 mmol/L (ref 98–111)
Creatinine, Ser: 0.76 mg/dL (ref 0.44–1.00)
GFR, Estimated: >60 mL/min (ref >60)
Glucose, Bld: 99 mg/dL (ref 70–99)
Potassium: 4.3 mmol/L (ref 3.5–5.1)
Sodium: 137 mmol/L (ref 135–145)
Total Bilirubin: 0.5 mg/dL (ref 0.3–1.2)
Total Protein: 7 g/dL (ref 6.5–8.1)

## 2023-02-18 LAB — URINALYSIS, ROUTINE W REFLEX MICROSCOPIC
Bacteria, UA: NONE SEEN
Bilirubin Urine: NEGATIVE
Glucose, UA: NEGATIVE mg/dL
Ketones, ur: NEGATIVE mg/dL
Leukocytes,Ua: NEGATIVE
Nitrite: NEGATIVE
Protein, ur: NEGATIVE mg/dL
Specific Gravity, Urine: 1.005 (ref 1.005–1.030)
pH: 6 (ref 5.0–8.0)

## 2023-02-18 LAB — DIFFERENTIAL
Abs Immature Granulocytes: 0.01 10*3/uL (ref 0.00–0.07)
Basophils Absolute: 0.1 10*3/uL (ref 0.0–0.1)
Basophils Relative: 1 %
Eosinophils Absolute: 0.3 10*3/uL (ref 0.0–0.5)
Eosinophils Relative: 5 %
Immature Granulocytes: 0 %
Lymphocytes Relative: 31 %
Lymphs Abs: 2.1 10*3/uL (ref 0.7–4.0)
Monocytes Absolute: 0.4 10*3/uL (ref 0.1–1.0)
Monocytes Relative: 5 %
Neutro Abs: 4 10*3/uL (ref 1.7–7.7)
Neutrophils Relative %: 58 %

## 2023-02-18 LAB — RAPID URINE DRUG SCREEN, HOSP PERFORMED
Amphetamines: NOT DETECTED
Barbiturates: NOT DETECTED
Benzodiazepines: POSITIVE — AB
Cocaine: NOT DETECTED
Opiates: NOT DETECTED
Tetrahydrocannabinol: NOT DETECTED

## 2023-02-18 LAB — CBC
HCT: 43.2 % (ref 36.0–46.0)
Hemoglobin: 13.9 g/dL (ref 12.0–15.0)
MCH: 28.2 pg (ref 26.0–34.0)
MCHC: 32.2 g/dL (ref 30.0–36.0)
MCV: 87.6 fL (ref 80.0–100.0)
Platelets: 221 10*3/uL (ref 150–400)
RBC: 4.93 MIL/uL (ref 3.87–5.11)
RDW: 13.2 % (ref 11.5–15.5)
WBC: 6.8 10*3/uL (ref 4.0–10.5)
nRBC: 0 % (ref 0.0–0.2)

## 2023-02-18 LAB — PROTIME-INR
INR: 0.9 (ref 0.8–1.2)
Prothrombin Time: 12 seconds (ref 11.4–15.2)

## 2023-02-18 LAB — I-STAT CHEM 8, ED
BUN: 20 mg/dL (ref 6–20)
Calcium, Ion: 1.1 mmol/L — ABNORMAL LOW (ref 1.15–1.40)
Chloride: 104 mmol/L (ref 98–111)
Creatinine, Ser: 0.8 mg/dL (ref 0.44–1.00)
Glucose, Bld: 95 mg/dL (ref 70–99)
HCT: 42 % (ref 36.0–46.0)
Hemoglobin: 14.3 g/dL (ref 12.0–15.0)
Potassium: 4.8 mmol/L (ref 3.5–5.1)
Sodium: 138 mmol/L (ref 135–145)
TCO2: 25 mmol/L (ref 22–32)

## 2023-02-18 LAB — CBG MONITORING, ED: Glucose-Capillary: 95 mg/dL (ref 70–99)

## 2023-02-18 LAB — ETHANOL: Alcohol, Ethyl (B): <10 mg/dL (ref <10)

## 2023-02-18 LAB — APTT: aPTT: 26 seconds (ref 24–36)

## 2023-02-18 MED ORDER — KETOROLAC TROMETHAMINE 15 MG/ML IJ SOLN
15.0000 mg | Freq: Once | INTRAMUSCULAR | Status: AC
  Administered 2023-02-18: 15 mg via INTRAVENOUS
  Filled 2023-02-18: qty 1

## 2023-02-18 MED ORDER — METOCLOPRAMIDE HCL 5 MG/ML IJ SOLN
10.0000 mg | Freq: Once | INTRAMUSCULAR | Status: AC
  Administered 2023-02-18: 10 mg via INTRAVENOUS
  Filled 2023-02-18: qty 2

## 2023-02-18 MED ORDER — SODIUM CHLORIDE 0.9 % IV BOLUS
1000.0000 mL | Freq: Once | INTRAVENOUS | Status: AC
  Administered 2023-02-18: 1000 mL via INTRAVENOUS

## 2023-02-18 MED ORDER — DIPHENHYDRAMINE HCL 50 MG/ML IJ SOLN
12.5000 mg | Freq: Once | INTRAMUSCULAR | Status: AC
  Administered 2023-02-18: 12.5 mg via INTRAVENOUS
  Filled 2023-02-18: qty 1

## 2023-02-18 MED ORDER — ONDANSETRON HCL 4 MG/2ML IJ SOLN
4.0000 mg | Freq: Once | INTRAMUSCULAR | Status: AC
  Administered 2023-02-18: 4 mg via INTRAVENOUS
  Filled 2023-02-18: qty 2

## 2023-02-18 MED ORDER — MORPHINE SULFATE (PF) 4 MG/ML IV SOLN
4.0000 mg | Freq: Once | INTRAVENOUS | Status: AC
  Administered 2023-02-18: 4 mg via INTRAVENOUS
  Filled 2023-02-18: qty 1

## 2023-02-18 MED ORDER — SODIUM CHLORIDE 0.9 % IV SOLN: INTRAVENOUS | Status: DC

## 2023-02-18 MED ORDER — ONDANSETRON 4 MG PO TBDP: 4.0000 mg | ORAL_TABLET | Freq: Three times a day (TID) | ORAL | 0 refills | Status: AC | PRN

## 2023-02-18 MED ORDER — RIZATRIPTAN BENZOATE 10 MG PO TBDP: 10.0000 mg | ORAL_TABLET | ORAL | 0 refills | Status: DC | PRN

## 2023-02-18 NOTE — ED Provider Notes (Signed)
[place] EMERGENCY DEPARTMENT AT [facility] Provider Note   CSN: [id number] Arrival date & time: 02/18/23  4098  An emergency department physician performed an initial assessment on this suspected stroke patient at 0845.  History  Chief Complaint  Patient presents with   Code Stroke    Bridget Maldonado is a 42 y.o. female.  Pt is a 42 yo female with pmhx significant for migraines, ibs, bipolar d/o, endometriosis, htn, cva and hld.  Pt has had a headache since yesterday.  She developed slurred speech around 0700.  Pt called EMS and a code stroke was called en route.  Pt met at the bridge by neuro and the ED team.  Pt denies any other sx other than a headache.       Home Medications Prior to Admission medications   Medication Sig Start Date End Date Taking? Authorizing Provider  ondansetron (ZOFRAN-ODT) 4 MG disintegrating tablet Take 1 tablet (4 mg total) by mouth every 8 (eight) hours as needed. 02/18/23  Yes Jacalyn Lefevre, MD  rizatriptan (MAXALT-MLT) 10 MG disintegrating tablet Take 1 tablet (10 mg total) by mouth as needed for migraine. May repeat in 2 hours if needed 02/18/23  Yes Jacalyn Lefevre, MD  albuterol (PROVENTIL HFA;VENTOLIN HFA) 108 (90 Base) MCG/ACT inhaler Inhale 2 puffs into the lungs every 6 (six) hours as needed for wheezing or shortness of breath. 04/16/16   Karie Schwalbe, MD  ALPRAZolam Prudy Feeler) 0.25 MG tablet Take 1 tablet (0.25 mg total) by mouth 3 (three) times daily as needed for anxiety. 02/11/23   Karie Schwalbe, MD  aspirin EC 81 MG tablet Take 81 mg by mouth daily. Swallow whole.    [provider]  atorvastatin (LIPITOR) 20 MG tablet TAKE ONE TABLET (20 MG TOTAL) BY MOUTH DAILY. 01/09/23   Karie Schwalbe, MD  butalbital-acetaminophen-caffeine (FIORICET) [phone] MG tablet Take 1 tablet by mouth daily as needed for headache. 12/15/22   Karie Schwalbe, MD  DULoxetine (CYMBALTA) 60 MG capsule Take 1 capsule (60 mg total) by  mouth daily. 12/15/22   Karie Schwalbe, MD  lisinopril-hydrochlorothiazide (ZESTORETIC) 20-12.5 MG tablet Take 1 tablet by mouth daily. 12/30/22   Karie Schwalbe, MD  mometasone (ELOCON) 0.1 % ointment APPLY TWICE DAILY AS NEEDED FOR PSORIASIS 10/15/18   Karie Schwalbe, MD  montelukast (SINGULAIR) 10 MG tablet Take 1 tablet (10 mg total) by mouth at bedtime. 09/17/22   Karie Schwalbe, MD  traMADol (ULTRAM) 50 MG tablet Take 1 tablet (50 mg total) by mouth 2 (two) times daily as needed. 02/09/23   Karie Schwalbe, MD      Allergies    Sulfa antibiotics, Cipro [ciprofloxacin hcl], and Effexor [venlafaxine hcl]    Review of Systems   Review of Systems  Neurological:  Positive for speech difficulty and headaches.  All other systems reviewed and are negative.   Physical Exam Updated Vital Signs BP (!) 131/97   Pulse 89   Temp 98.9 F (37.2 C) (Oral)   Resp 18   Ht 5\' 4"  (1.626 m)   Wt 105 kg   LMP 10/21/2021 (Approximate) Comment: Urine preg negatvie 03/04/22  SpO2 100%   BMI 39.73 kg/m  Physical Exam Vitals and nursing note reviewed.  Constitutional:      Appearance: Normal appearance.  HENT:     Head: Normocephalic and atraumatic.     Right Ear: External ear normal.     Left Ear: External ear  normal.     Nose: Nose normal.     Mouth/Throat:     Mouth: Mucous membranes are moist.     Pharynx: Oropharynx is clear.  Eyes:     Extraocular Movements: Extraocular movements intact.     Conjunctiva/sclera: Conjunctivae normal.     Pupils: Pupils are equal, round, and reactive to light.  Cardiovascular:     Rate and Rhythm: Normal rate and regular rhythm.     Pulses: Normal pulses.     Heart sounds: Normal heart sounds.  Pulmonary:     Effort: Pulmonary effort is normal.     Breath sounds: Normal breath sounds.  Abdominal:     General: Abdomen is flat. Bowel sounds are normal.     Palpations: Abdomen is soft.  Musculoskeletal:        General: Normal range of motion.      Cervical back: Normal range of motion and neck supple.  Skin:    General: Skin is warm.     Capillary Refill: Capillary refill takes less than 2 seconds.  Neurological:     Mental Status: She is alert and oriented to person, place, and time.     Comments: Slurred speech  Psychiatric:        Mood and Affect: Mood normal.        Behavior: Behavior normal.     ED Results / Procedures / Treatments   Labs (all labs ordered are listed, but only abnormal results are displayed) Labs Reviewed  RAPID URINE DRUG SCREEN, HOSP PERFORMED - Abnormal; Notable for the following components:      Result Value   Benzodiazepines POSITIVE (*)    All other components within normal limits  URINALYSIS, ROUTINE W REFLEX MICROSCOPIC - Abnormal; Notable for the following components:   Color, Urine STRAW (*)    Hgb urine dipstick SMALL (*)    All other components within normal limits  COMPREHENSIVE METABOLIC PANEL - Abnormal; Notable for the following components:   Albumin 3.3 (*)    ALT 59 (*)    Alkaline Phosphatase 133 (*)    All other components within normal limits  I-STAT CHEM 8, ED - Abnormal; Notable for the following components:   Calcium, Ion 1.10 (*)    All other components within normal limits  ETHANOL  PROTIME-INR  APTT  CBC  DIFFERENTIAL  CBG MONITORING, ED    EKG EKG Interpretation Date/Time:  Wednesday February 18 2023 09:30:04 EDT Ventricular Rate:  88 PR Interval:  153 QRS Duration:  84 QT Interval:  366 QTC Calculation: 443 R Axis:   63  Text Interpretation: Sinus rhythm Low voltage, precordial leads No significant change since last tracing Confirmed by Jacalyn Lefevre [phone]) on 02/18/2023 10:06:12 AM  Radiology MR BRAIN WO CONTRAST  Result Date: 02/18/2023 CLINICAL DATA:  42 year old female with stuttering speech. Abnormal left parietal lobe on previous exams. EXAM: MRI HEAD WITHOUT CONTRAST TECHNIQUE: Multiplanar, multiecho pulse sequences of the brain and  surrounding structures were obtained without intravenous contrast. COMPARISON:  Brain MRI 12/06/2022 and earlier. FINDINGS: Brain: No convincing restricted diffusion or evidence of acute infarction. There is patchy cortical and subcortical white matter encephalomalacia in the high left parietal lobe. T2 shine through there on DWI today. Elsewhere gray and white matter signal is stable and largely normal for age (minimal white matter T2/FLAIR hyperintensity in the left superior frontal gyrus and right temporal lobe). No other cortical encephalomalacia. No chronic cerebral blood products on SWI. Incidental right cerebellar DVA (  normal variant). No midline shift, mass effect, evidence of mass lesion, ventriculomegaly, extra-axial collection or acute intracranial hemorrhage. Cervicomedullary junction and pituitary are within normal limits. Vascular: Major intracranial vascular flow voids are stable, preserved. Skull and upper cervical spine: Normal visible cervical spine. Visualized bone marrow signal is within normal limits. Sinuses/Orbits: Mild orbit motion artifact today. Stable and negative. Other: Mastoids remain clear. Visible internal auditory structures appear normal. Negative visible scalp and face. IMPRESSION: 1. No acute intracranial abnormality. 2. Patchy chronic encephalomalacia in the high left parietal lobe. Preliminary report of the above discussed by telephone with Neurology Dr. Derry Lory. Electronically Signed   By: Odessa Fleming M.D.   On: 02/18/2023 09:28    Procedures Procedures    Medications Ordered in ED Medications  sodium chloride 0.9 % bolus 1,000 mL (1,000 mLs Intravenous New Bag/Given 02/18/23 0950)    And  0.9 %  sodium chloride infusion (has no administration in time range)  ketorolac (TORADOL) 15 MG/ML injection 15 mg (15 mg Intravenous Given 02/18/23 0951)  metoCLOPramide (REGLAN) injection 10 mg (10 mg Intravenous Given 02/18/23 0951)  diphenhydrAMINE (BENADRYL) injection 12.5 mg  (12.5 mg Intravenous Given 02/18/23 0951)  morphine (PF) 4 MG/ML injection 4 mg (4 mg Intravenous Given 02/18/23 1129)  ondansetron (ZOFRAN) injection 4 mg (4 mg Intravenous Given 02/18/23 1125)    ED Course/ Medical Decision Making/ A&P                                 Medical Decision Making Amount and/or Complexity of Data Reviewed Labs: ordered.  Risk Prescription drug management.   This patient presents to the ED for concern of cva, this involves an extensive number of treatment options, and is a complaint that carries with it a high risk of complications and morbidity.  The differential diagnosis includes cva, migraine, infection   Co morbidities that complicate the patient evaluation   migraines, ibs, bipolar d/o, endometriosis, htn, cva and hld   Additional history obtained:  Additional history obtained from epic chart review External records from outside source obtained and reviewed including EMS report/family   Lab Tests:  I Ordered, and personally interpreted labs.  The pertinent results include:  cbc, cmp, inr nl, etoh nl, ua nl, uds + bzd   Imaging Studies ordered:  I ordered imaging studies including mri brain  I independently visualized and interpreted imaging which showed  No acute intracranial abnormality.  2. Patchy chronic encephalomalacia in the high left parietal lobe.   I agree with the radiologist interpretation   Cardiac Monitoring:  The patient was maintained on a cardiac monitor.  I personally viewed and interpreted the cardiac monitored which showed an underlying rhythm of: nsr   Medicines ordered and prescription drug management:  I ordered medication including toradol/reglan/benadryl/morphine/zofran  for sx  Reevaluation of the patient after these medicines showed that the patient improved I have reviewed the patients home medicines and have made adjustments as needed   Test Considered:  mri   Critical Interventions:  Code  stroke   Consultations Obtained:  I requested consultation with Dr. Derry Lory (neuro),  and discussed lab and imaging findings as well as pertinent plan - MRI showed nothing acute, so pt can go home after headache tx  Problem List / ED Course:  Complex migraine:  speech difficulties have resolved after headache improvement.  Pt is stable for d/c.  Return if worse.  F/u with neuro.  Reevaluation:  After the interventions noted above, I reevaluated the patient and found that they have :improved   Social Determinants of Health:  Lives at home   Dispostion:  After consideration of the diagnostic results and the patients response to treatment, I feel that the patent would benefit from discharge with outpatient f/u.          Final Clinical Impression(s) / ED Diagnoses Final diagnoses:  Other migraine without status migrainosus, not intractable    Rx / DC Orders ED Discharge Orders          Ordered    Ambulatory referral to Neurology       Comments: An appointment is requested in approximately: 1 week   02/18/23 1230    rizatriptan (MAXALT-MLT) 10 MG disintegrating tablet  As needed        02/18/23 1230    ondansetron (ZOFRAN-ODT) 4 MG disintegrating tablet  Every 8 hours PRN        02/18/23 1230              Jacalyn Lefevre, MD 02/18/23 1238

## 2023-02-18 NOTE — ED Notes (Signed)
Discharge instructions reviewed with pt. Pt verbalized understanding and had no further questions. Pt accompanied by family, pt aox4.

## 2023-02-18 NOTE — Code Documentation (Signed)
Stroke Response Nurse Documentation Code Documentation  Bridget Maldonado is a 42 y.o. female arriving to [place]  via [facility] on 02/18/2023 with past medical hx of hypertension, stroke 11/2022 no residuals, bipolar, migraine, and anxiety with panic. On aspirin 81 mg daily. Code stroke was activated by EMS.   Patient from home. She woke up at 0630 feeling like she did in June when she came to the hospital for a stroke and thought she would have slurred speech soon. Noted slurred speech while getting her daughter ready for school. LKW 0700.   Stroke team at the bedside on patient arrival. Labs drawn and patient cleared for scan by EDP. Patient to MRI with team for decision making results. NIHSS 3, see documentation for details and code stroke times. Patient with disoriented, left decreased sensation, and dysarthria  on exam. The following imaging was completed:  MRI. Patient is not a candidate for IV Thrombolytic due to MRI negative. Code stroke cancelled.  Bedside handoff with ED RN.    Ferman Hamming  Stroke Response RN

## 2023-02-18 NOTE — ED Triage Notes (Signed)
Patient BIB [facility] from home c/o stroke like symptoms. Patient states she woke up this morning around 0630 feeling like she was having a stroke. Around 0700 patient started having slurred speech, and headache. Patient has history of stroke in the past.

## 2023-02-18 NOTE — Consult Note (Addendum)
Neurology Consultation  Reason for Consult: Code stroke  Referring Physician: Dr. Particia Nearing   CC: speech changes and left side weakness   History is obtained from:patient   HPI: Bridget Maldonado is a 42 y.o. female with past medical history of migraines, prior CVA, HTN, bipolar, anxiety, asthma, who presents to [facility] EMS as a code stroke. Per patient, she woke @ 0630 feeling not right, like she was going to start having stuttering speech. Between 0700 and 0740 she began to have changes with her speech, left side weakness and headache on the top of her head. NIHSS 3 for confusion, dysarthria and sensation deficits. Patient went straight to MRI for STAT MRI brain to evaluate for acute stroke. MRI brain negative for acute stroke. Code stroke cancelled by Dr. Derry Lory.    LKW: 0630 IV thrombolysis given?: no, not a stroke  EVT:  No LVO Premorbid modified Rankin scale (mRS):  0-Completely asymptomatic and back to baseline post-stroke   ROS: Full ROS was performed and is negative except as noted in the HPI.    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 great uncles   Alzheimer's disease Other        Dad's side   Colon cancer Other        mat GGF     Social History:   reports that she has never smoked. She has been exposed to tobacco smoke. She has never used smokeless tobacco. She reports that she does not drink alcohol and does not use drugs.  Medications No current facility-administered medications for this encounter.  Current Outpatient Medications:    albuterol (PROVENTIL HFA;VENTOLIN HFA) 108 (90 Base) MCG/ACT inhaler, Inhale 2 puffs into the lungs every 6 (six) hours as needed for wheezing or shortness of breath., Disp: 1 Inhaler, Rfl: 1   ALPRAZolam (XANAX) 0.25 MG tablet, Take 1 tablet (0.25 mg total) by mouth 3 (three) times daily as needed for anxiety., Disp: 60 tablet, Rfl: 0   aspirin EC 81 MG tablet, Take 81 mg by mouth daily. Swallow whole., Disp: , Rfl:    atorvastatin (LIPITOR) 20 MG tablet, TAKE ONE TABLET (20 MG TOTAL) BY MOUTH DAILY., Disp: 30 tablet, Rfl: 11   butalbital-acetaminophen-caffeine (FIORICET) 50-325-40  MG tablet, Take 1 tablet by mouth daily as needed for headache., Disp: 14 tablet, Rfl: 0   DULoxetine (CYMBALTA) 60 MG capsule, Take 1 capsule (60 mg total) by mouth daily., Disp: 30 capsule, Rfl: 3   lisinopril-hydrochlorothiazide (ZESTORETIC) 20-12.5 MG tablet, Take 1 tablet by mouth daily., Disp: 90 tablet, Rfl: 3   mometasone (ELOCON) 0.1 % ointment, APPLY TWICE DAILY AS NEEDED FOR PSORIASIS, Disp: 45 g, Rfl: 1   montelukast (SINGULAIR) 10 MG tablet, Take 1 tablet (10 mg total) by mouth at bedtime., Disp: 90 tablet, Rfl: 3   traMADol (ULTRAM) 50 MG tablet, Take 1 tablet (50 mg total) by mouth 2 (two) times daily as needed., Disp: 30 tablet, Rfl: 0   Exam: Current vital signs: Wt 105 kg   LMP 10/21/2021 (Approximate) Comment: Urine preg  negatvie 03/04/22  BMI 39.73 kg/m  Vital signs in last 24 hours: Weight:  [105 kg] 105 kg (08/28 0800)  GENERAL: Awake, alert in NAD HEENT: - Normocephalic and atraumatic, dry mm LUNGS - Clear to auscultation bilaterally with no wheezes CV - S1S2 RRR, no m/r/g, equal pulses bilaterally. ABDOMEN - Soft, nontender, nondistended with normoactive BS Ext: warm, well perfused, intact peripheral pulses, no edema  NEURO:  Mental Status: AA&Ox3. She was unable to correctly identify the current month  Language: speech is dysarthric  Naming, repetition, fluency, and comprehension intact. Cranial Nerves: PERRL EOMI, visual fields full, no facial asymmetry, facial sensation intact, hearing intact, tongue/uvula/soft palate midline, normal sternocleidomastoid and trapezius muscle strength. No evidence of tongue atrophy or fibrillations Motor: 5/5 in all 4 extremities  Tone: is normal and bulk is normal Sensation- decreased on the left  Coordination: FTN intact bilaterally, no ataxia in BLE. Gait- deferred  NIHSS 1a Level of Conscious.: 0 1b LOC Questions: 1 1c LOC Commands: 0 2 Best Gaze: 0 3 Visual: 0 4 Facial Palsy: 0 5a Motor Arm - left: 0 5b Motor Arm - Right: 0 6a Motor Leg - Left: 0 6b Motor Leg - Right: 0 7 Limb Ataxia: 0 8 Sensory: 1 9 Best Language: 0 10 Dysarthria: 1 11 Extinct. and Inatten.: 0 TOTAL: 3   Labs I have reviewed labs in epic and the results pertinent to this consultation are:  CBC    Component Value Date/Time   WBC 5.9 12/09/2022 0550   RBC 4.87 12/09/2022 0550   HGB 13.3 12/09/2022 0550   HGB 14.1 08/25/2014 0000   HCT 41.1 12/09/2022 0550   HCT 42 08/25/2014 0000   PLT 238 12/09/2022 0550   PLT 238 08/25/2014 0000   MCV 84.4 12/09/2022 0550   MCV 86 07/11/2013 1844   MCH 27.3 12/09/2022 0550   MCHC 32.4 12/09/2022 0550   RDW 14.2 12/09/2022 0550   RDW 13.7 07/11/2013 1844   LYMPHSABS 2.0 12/05/2022 0522   MONOABS 0.3 12/05/2022 0522   EOSABS  0.2 12/05/2022 0522   BASOSABS 0.1 12/05/2022 0522   BASOSABS 1 07/11/2013 1844    CMP     Component Value Date/Time   NA 138 02/11/2023 0825   NA 138 07/11/2013 1844   K 4.0 02/11/2023 0825   K 3.3 (L) 07/11/2013 1844   CL 100 02/11/2023 0825   CL 105 07/11/2013 1844   CO2 30 02/11/2023 0825   CO2 28 07/11/2013 1844   GLUCOSE 139 (H) 02/11/2023 0825   GLUCOSE 89 07/11/2013 1844   BUN 16 02/11/2023 0825   BUN 12 07/11/2013 1844   CREATININE 0.91 02/11/2023 0825  CREATININE 0.79 07/11/2013 1844   CALCIUM 9.0 02/11/2023 0825   CALCIUM 8.9 07/11/2013 1844   PROT 7.2 12/05/2022 0522   PROT 7.7 07/11/2013 1844   ALBUMIN 3.8 02/11/2023 0825   ALBUMIN 3.3 (L) 07/11/2013 1844   AST 19 12/05/2022 0522   AST 75 (H) 07/11/2013 1844   ALT 20 12/05/2022 0522   ALT 86 (H) 07/11/2013 1844   ALKPHOS 104 12/05/2022 0522   ALKPHOS 134 (H) 07/11/2013 1844   BILITOT 0.6 12/05/2022 0522   BILITOT 0.5 07/11/2013 1844   GFRNONAA >60 12/09/2022 0550   GFRNONAA >60 07/11/2013 1844   GFRAA >60 10/13/2019 1744   GFRAA >60 07/11/2013 1844    Lipid Panel     Component Value Date/Time   CHOL 202 (H) 12/05/2022 0522   TRIG 67 12/05/2022 0522   HDL 58 12/05/2022 0522   CHOLHDL 3.5 12/05/2022 0522   VLDL 13 12/05/2022 0522   LDLCALC 131 (H) 12/05/2022 0522    Lab Results  Component Value Date   HGBA1C 4.9 12/05/2022      Imaging I have reviewed the images obtained:  MRI examination of the brain negative   Assessment:  42 y.o. female with past medical history of migraines, prior CVA, HTN, bipolar, anxiety, asthma, who presents to [facility] Via EMS as a code stroke. Per patient, she woke @ 0630 feeling not right, like she was going to start having stuttering speech. Between 0700 and 0740 she began to have changes with her speech, left side weakness and headache on the top of her head.   Recommendations: Cancel code stroke by Dr. Derry Lory  Neurology will sign off. Please call if we can  be of further assistance   Bridget Mart DNP, ACNPC-AG  [facility]   NEUROHOSPITALIST ADDENDUM Performed a face to face diagnostic evaluation.   I have reviewed the contents of history and physical exam as documented by PA/ARNP/Resident and agree with above documentation.  I have discussed and formulated the above plan as documented. Edits to the note have been made as needed.  Impression/Key exam findings/Plan: presents with headache and stuttering speech. Seems functional and therefore we obtained STAT MRI Brain instead of offering her tnkase right away. MRI brain was negative for an acute infarct. Has known patchy chronic high left parietal lobe encephalomalacia that does present as shine through on DWI. With no acute stroke on MRI, can do headache cocktail and discharge with outpatient follow up.  We will signoff.  Erick Blinks, MD [facility] [phone]   If 7pm to 7am, please call on call as listed on AMION.

## 2023-02-19 ENCOUNTER — Emergency Department: Payer: No Typology Code available for payment source

## 2023-02-19 ENCOUNTER — Telehealth: Payer: Self-pay

## 2023-02-19 ENCOUNTER — Other Ambulatory Visit: Payer: Self-pay

## 2023-02-19 ENCOUNTER — Emergency Department
Admission: EM | Admit: 2023-02-19 | Discharge: 2023-02-19 | Disposition: A | Discharge status: Home / Self Care | Payer: No Typology Code available for payment source | Attending: Emergency Medicine | Admitting: Emergency Medicine

## 2023-02-19 DIAGNOSIS — F447 Conversion disorder with mixed symptom presentation: Secondary | ICD-10-CM

## 2023-02-19 DIAGNOSIS — J45909 Unspecified asthma, uncomplicated: Secondary | ICD-10-CM | POA: Insufficient documentation

## 2023-02-19 DIAGNOSIS — G43809 Other migraine, not intractable, without status migrainosus: Secondary | ICD-10-CM

## 2023-02-19 DIAGNOSIS — I10 Essential (primary) hypertension: Secondary | ICD-10-CM | POA: Insufficient documentation

## 2023-02-19 DIAGNOSIS — R471 Dysarthria and anarthria: Secondary | ICD-10-CM | POA: Diagnosis not present

## 2023-02-19 DIAGNOSIS — Z8673 Personal history of transient ischemic attack (TIA), and cerebral infarction without residual deficits: Secondary | ICD-10-CM | POA: Diagnosis not present

## 2023-02-19 DIAGNOSIS — R519 Headache, unspecified: Secondary | ICD-10-CM | POA: Diagnosis present

## 2023-02-19 LAB — CBC
HCT: 42.6 % (ref 36.0–46.0)
Hemoglobin: 13.5 g/dL (ref 12.0–15.0)
MCH: 28.1 pg (ref 26.0–34.0)
MCHC: 31.7 g/dL (ref 30.0–36.0)
MCV: 88.8 fL (ref 80.0–100.0)
Platelets: 245 10*3/uL (ref 150–400)
RBC: 4.8 MIL/uL (ref 3.87–5.11)
RDW: 13.2 % (ref 11.5–15.5)
WBC: 6.7 10*3/uL (ref 4.0–10.5)
nRBC: 0 % (ref 0.0–0.2)

## 2023-02-19 LAB — DIFFERENTIAL
Abs Immature Granulocytes: 0.01 10*3/uL (ref 0.00–0.07)
Basophils Absolute: 0.1 10*3/uL (ref 0.0–0.1)
Basophils Relative: 1 %
Eosinophils Absolute: 0.4 10*3/uL (ref 0.0–0.5)
Eosinophils Relative: 5 %
Immature Granulocytes: 0 %
Lymphocytes Relative: 33 %
Lymphs Abs: 2.2 10*3/uL (ref 0.7–4.0)
Monocytes Absolute: 0.3 10*3/uL (ref 0.1–1.0)
Monocytes Relative: 5 %
Neutro Abs: 3.7 10*3/uL (ref 1.7–7.7)
Neutrophils Relative %: 56 %

## 2023-02-19 LAB — COMPREHENSIVE METABOLIC PANEL
ALT: 48 U/L — ABNORMAL HIGH (ref 0–44)
AST: 34 U/L (ref 15–41)
Albumin: 3.2 g/dL — ABNORMAL LOW (ref 3.5–5.0)
Alkaline Phosphatase: 124 U/L (ref 38–126)
Anion gap: 5 (ref 5–15)
BUN: 13 mg/dL (ref 6–20)
CO2: 24 mmol/L (ref 22–32)
Calcium: 8.3 mg/dL — ABNORMAL LOW (ref 8.9–10.3)
Chloride: 106 mmol/L (ref 98–111)
Creatinine, Ser: 0.78 mg/dL (ref 0.44–1.00)
GFR, Estimated: >60 mL/min (ref >60)
Glucose, Bld: 83 mg/dL (ref 70–99)
Potassium: 3.7 mmol/L (ref 3.5–5.1)
Sodium: 135 mmol/L (ref 135–145)
Total Bilirubin: 0.4 mg/dL (ref 0.3–1.2)
Total Protein: 6.7 g/dL (ref 6.5–8.1)

## 2023-02-19 LAB — CBG MONITORING, ED: Glucose-Capillary: 124 mg/dL — ABNORMAL HIGH (ref 70–99)

## 2023-02-19 LAB — PROTIME-INR
INR: 1 (ref 0.8–1.2)
Prothrombin Time: 13.2 seconds (ref 11.4–15.2)

## 2023-02-19 LAB — APTT: aPTT: 26 seconds (ref 24–36)

## 2023-02-19 LAB — ETHANOL: Alcohol, Ethyl (B): <10 mg/dL (ref <10)

## 2023-02-19 MED ORDER — SODIUM CHLORIDE 0.9 % IV BOLUS
1000.0000 mL | Freq: Once | INTRAVENOUS | Status: AC
  Administered 2023-02-19: 1000 mL via INTRAVENOUS

## 2023-02-19 MED ORDER — SODIUM CHLORIDE 0.9% FLUSH: 3.0000 mL | Freq: Once | INTRAVENOUS | Status: DC

## 2023-02-19 MED ORDER — DIPHENHYDRAMINE HCL 50 MG/ML IJ SOLN
25.0000 mg | Freq: Once | INTRAMUSCULAR | Status: AC
  Administered 2023-02-19: 25 mg via INTRAVENOUS
  Filled 2023-02-19: qty 1

## 2023-02-19 MED ORDER — METOCLOPRAMIDE HCL 5 MG/ML IJ SOLN
10.0000 mg | Freq: Once | INTRAMUSCULAR | Status: AC
  Administered 2023-02-19: 10 mg via INTRAVENOUS
  Filled 2023-02-19: qty 2

## 2023-02-19 MED ORDER — KETOROLAC TROMETHAMINE 30 MG/ML IJ SOLN
30.0000 mg | Freq: Once | INTRAMUSCULAR | Status: AC
  Administered 2023-02-19: 30 mg via INTRAVENOUS
  Filled 2023-02-19: qty 1

## 2023-02-19 NOTE — Transitions of Care (Post Inpatient/ED Visit) (Signed)
   02/19/2023  Name: Bridget Maldonado [facility] MRN: [id number] DOB: 08/08/1980  Today's TOC FU Call Status: Today's TOC FU Call Status:: Unsuccessful Call (1st Attempt) Unsuccessful Call (1st Attempt) Date: 02/19/23  Attempted to reach the patient regarding the most recent Inpatient/ED visit.  Follow Up Plan: Additional outreach attempts will be made to reach the patient to complete the Transitions of Care (Post Inpatient/ED visit) call.   Signature Elisha Ponder LPN [facility] AWV Team Direct dial:  [phone]

## 2023-02-19 NOTE — Progress Notes (Signed)
This Chap responded to Code Stroke. Pt in the room as well as Pts Mother & Pts Husband. This Chap introduced Spiritual Care and comforted Pt with a calming presence. Chap services are available as need be.   02/19/23 1000  Spiritual Encounters  Type of Visit Initial  Care provided to: Pt and family  Conversation partners present during encounter Social worker/Care management/TOC  Referral source Code page  Reason for visit Code  OnCall Visit Yes  Interventions  Spiritual Care Interventions Made Established relationship of care and support;Mindfulness intervention  Intervention Outcomes  Outcomes Connection to spiritual care;Awareness of support  Spiritual Care Plan  Spiritual Care Issues Still Outstanding No further spiritual care needs at this time (see row info)

## 2023-02-19 NOTE — ED Notes (Signed)
Pt verbalizes understanding of discharge instructions. Opportunity for questioning and answers were provided. Pt discharged from ED to home with family.    

## 2023-02-19 NOTE — ED Notes (Signed)
6213 Code Stroke cart activated in CT- pt already on imaging table. LKW 0730, slurred speech. Pt seen at [facility] yesterday with same sx- dx with complicated migraine. Pt with h/o CVA in June- BP 146/102, glucose 124 0911 Dr Selina Cooley paged [phone] Dr Selina Cooley at bedside in [place] 0914 not a TNK candidiate per Dr Selina Cooley d/t CVA in June

## 2023-02-19 NOTE — ED Notes (Signed)
DIRECTING TO CANCEL CODE STROKE  PER DR. Selina Cooley MD

## 2023-02-19 NOTE — ED Notes (Signed)
Code Stroke cancelled per Dr. Selina Cooley.

## 2023-02-19 NOTE — ED Notes (Signed)
Pt ambulatory to restroom. Steady gait. Clear speech

## 2023-02-19 NOTE — ED Triage Notes (Signed)
C?O slurred speech onset today at 0730.  SEen at [facility] yesterday for same, diagnosed with complex Migraine.  States symptoms resolved yesterday and returned this morning at 0730.  AAOx3.  Skin warm and dry.  Difficulty getting words out appreciated.

## 2023-02-19 NOTE — Discharge Instructions (Signed)
Follow-up with your primary care provider and therapist as discussed.  Return to the ER for new, worsening, or persistent severe speech disturbance, weakness or numbness, vision changes, severe headache, or any other new or worsening symptoms that concern you.

## 2023-02-19 NOTE — ED Notes (Signed)
P-t taken to ct 2

## 2023-02-19 NOTE — ED Notes (Signed)
See triage note, pt reports slurred speech started at 0730 today. Similar sx yesterday and dx with complex migraine. Pt alert and oriented.  Pt appeared to have initial weakness to left leg in CT, currently right leg appears weaker than left. Reports decreased sensation to entire right side of body.

## 2023-02-19 NOTE — ED Provider Notes (Signed)
[facility] Provider Note    Event Date/Time   First MD Initiated Contact with Patient 02/19/23 [id number]     (approximate)   History   Code Stroke   HPI  Bridget Maldonado is a 42 y.o. female with history of migraines, hypertension, hyperlipidemia, CVA, bipolar disorder, IBS, and endometriosis who presents with acute onset of slurred speech at 7:30 AM.  The patient states she also has a headache.  She states that she woke up and "felt weird" then took a Maxalt.  She denies any weakness or numbness in arms or legs currently although has had some difficulty with movement of the right leg since the symptoms started this morning.  She states that the symptoms are similar to what she presented to [facility] with yesterday.  I reviewed the past medical records.  The patient was evaluated at the [facility], ED yesterday with a similar presentation.  She is a good by Dr. Derry Lory from neurology and was determined to have likely complex migraine.  MRI showed no acute findings.   Physical Exam   Triage Vital Signs: ED Triage Vitals [02/19/23 0905]  Encounter Vitals Group     BP (!) 146/102     Systolic BP Percentile      Diastolic BP Percentile      Pulse Rate 99     Resp 18     Temp 98 F (36.7 C)     Temp src      SpO2 100 %     Weight      Height      Head Circumference      Peak Flow      Pain Score      Pain Loc      Pain Education      Exclude from Growth Chart     Most recent vital signs: Vitals:   02/19/23 1100 02/19/23 1130  BP: (!) 154/100 (!) 154/100  Pulse: 84 86  Resp: 18 17  Temp:    SpO2: 97% 97%     General: Awake, no distress.  CV:  Good peripheral perfusion.  Resp:  Normal effort.  Abd:  No distention.  Other:  Dysarthric speech and stuttering.  EOMI.  PERRLA.  No photophobia.  No facial droop.  Slight decreased motor strength right upper extremity, otherwise 5/5 motor strength and intact sensation to all extremities.  No  pronator drift.  No ataxia.   ED Results / Procedures / Treatments   Labs (all labs ordered are listed, but only abnormal results are displayed) Labs Reviewed  COMPREHENSIVE METABOLIC PANEL - Abnormal; Notable for the following components:      Result Value   Calcium 8.3 (*)    Albumin 3.2 (*)    ALT 48 (*)    All other components within normal limits  CBG MONITORING, ED - Abnormal; Notable for the following components:   Glucose-Capillary 124 (*)    All other components within normal limits  PROTIME-INR  APTT  CBC  DIFFERENTIAL  ETHANOL     EKG  ED ECG REPORT I, Dionne Bucy, the attending physician, personally viewed and interpreted this ECG.  Date: 02/19/2023 EKG Time: 0931 Rate: 92 Rhythm: normal sinus rhythm QRS Axis: normal Intervals: normal ST/T Wave abnormalities: normal Narrative Interpretation: no evidence of acute ischemia    RADIOLOGY  CT head: I independently viewed and interpreted the images; there is no ICH.  Radiology report indicates no acute abnormality  PROCEDURES:  Critical Care performed: Yes, see critical care procedure note(s)  .Critical Care  Performed by: Dionne Bucy, MD Authorized by: Dionne Bucy, MD   Critical care provider statement:    Critical care time (minutes):  15   Critical care time was exclusive of:  Separately billable procedures and treating other patients   Critical care was necessary to treat or prevent imminent or life-threatening deterioration of the following conditions:  CNS failure or compromise   Critical care was time spent personally by me on the following activities:  Development of treatment plan with patient or surrogate, discussions with consultants, evaluation of patient's response to treatment, examination of patient, ordering and review of laboratory studies, ordering and review of radiographic studies, ordering and performing treatments and interventions, pulse oximetry,  re-evaluation of patient's condition, review of old charts and obtaining history from patient or surrogate    MEDICATIONS ORDERED IN ED: Medications  sodium chloride flush (NS) 0.9 % injection 3 mL (3 mLs Intravenous Not Given 02/19/23 0948)  sodium chloride 0.9 % bolus 1,000 mL (0 mLs Intravenous Stopped 02/19/23 1036)  ketorolac (TORADOL) 30 MG/ML injection 30 mg (30 mg Intravenous Given 02/19/23 0942)  metoCLOPramide (REGLAN) injection 10 mg (10 mg Intravenous Given 02/19/23 0942)  diphenhydrAMINE (BENADRYL) injection 25 mg (25 mg Intravenous Given 02/19/23 0943)     IMPRESSION / MDM / ASSESSMENT AND PLAN / ED COURSE  I reviewed the triage vital signs and the nursing notes.  42 year old female with PMH as noted above presents with acute onset of dysarthric speech and some right-sided weakness along with a headache since 730 this morning.  She was seen for a similar presentation yesterday at [facility], had negative imaging, and was diagnosed with complex migraine.  Differential diagnosis includes, but is not limited to, complex migraine, less likely acute CVA, TIA, intracranial hemorrhage.  Patient's presentation is most consistent with acute presentation with potential threat to life or bodily function.  The patient is on the cardiac monitor to evaluate for evidence of arrhythmia and/or significant heart rate changes.  CT head was obtained and shows no acute findings.  We will obtain lab workup, give IV fluids and migraine cocktail, and reassess.  I consulted and discussed the case with Dr. Selina Cooley from neurology who has evaluated the patient.  She has canceled the code stroke and recommends migraine treatment and reassessment.  ----------------------------------------- 11:35 AM on 02/19/2023 -----------------------------------------  The patient's symptoms have resolved.  She has been evaluated by Dr. Selina Cooley who recommends discharge and follow-up with her outpatient providers.  She is  stable for discharge at this time.  Return precautions have been provided.  FINAL CLINICAL IMPRESSION(S) / ED DIAGNOSES   Final diagnoses:  Other migraine without status migrainosus, not intractable  Dysarthria     Rx / DC Orders   ED Discharge Orders     None        Note:  This document was prepared using Dragon voice recognition software and may include unintentional dictation errors.    Dionne Bucy, MD 02/19/23 1135

## 2023-02-20 NOTE — Consult Note (Signed)
NEUROLOGY CONSULTATION NOTE   Date of service: February 20, 2023 Patient Name: Bridget Maldonado MRN:  [id number] DOB:  08-29-1980 Reason for consult: abnormal neurologic sx Requesting physician: Dr. Dionne Bucy _ _ _   _ __   _ __ _ _  __ __   _ __   __ _  History of Present Illness   This is a 42 yo woman with hx punctate ischemic infarct in June 2024, presentation to [facility] yesterday with functional neurologic sx who presents with stuttering speech. LKW 0730 after which her speech started stuttering. On examination she initially had drift on the left which then resolved and subsequently she had drift on the right. CT head no acute process personal review. She was not a TNK candidate 2/2 ischemic infarct in June and functional exam. CTA was not performed 2/2 exam not c/w LVO. She had a migraine headache with her sx both today and yesterday.   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   Increased risk of breast cancer 07/2017   IBIS=40%   Kidney infection    Pylonephritis when pregnant   LGSIL on Pap smear of cervix    Migraines    Obese    Psoriatic arthritis (HCC)    Secondary oligomenorrhea 07/16/2017   Past Surgical History:  Procedure Laterality Date   BREAST BIOPSY Left 8 years   CESAREAN SECTION     COLONOSCOPY     COLONOSCOPY W/ BIOPSIES     CYSTOSCOPY N/A 03/04/2022   Procedure: CYSTOSCOPY;  Surgeon: [place] Bing, MD;  Location: [facility] OR;  Service: Gynecology;   Laterality: N/A;   EAR TUBE REMOVAL     ENDOMETRIAL BIOPSY  12/23/2021   ESOPHAGOGASTRODUODENOSCOPY     LAPAROSCOPIC CHOLECYSTECTOMY  6/09   Dr. Lindie Spruce   TOTAL LAPAROSCOPIC HYSTERECTOMY WITH SALPINGECTOMY Bilateral 03/04/2022   Procedure: TOTAL LAPAROSCOPIC HYSTERECTOMY WITH SALPINGECTOMY;  Surgeon: [place] Bing, MD;  Location: [facility] OR;  Service: Gynecology;  Laterality: Bilateral;   TUBAL LIGATION  2017   TYMPANOSTOMY TUBE PLACEMENT     Family History  Problem Relation Age of Onset   Colon polyps Maternal Grandfather    Parkinson's disease Maternal Grandfather    Breast cancer Mother 75   Rheum arthritis Mother    Irritable bowel syndrome Mother    Asthma Father    Anxiety disorder Father        Severe, possible bipolar   Irritable bowel syndrome Maternal Grandmother    Diabetes Other        mat. great uncles   Alzheimer's disease Other        Dad's side   Colon cancer Other        mat GGF   Social History   Socioeconomic History   Marital status: Legally Separated    Spouse name: Not on file   Number of children: 1   Years  of education: Not on file   Highest education level: Not on file  Occupational History   Occupation: Accounting    Comment: [employer]  Tobacco Use   Smoking status: Never    Passive exposure: Past   Smokeless tobacco: Never  Vaping Use   Vaping status: Never Used  Substance and Sexual Activity   Alcohol use: No    Alcohol/week: 0.0 standard drinks of alcohol   Drug use: No   Sexual activity: Yes    Birth control/protection: Surgical  Other Topics Concern   Not on file  Social History Narrative   Married 10/22   Social Determinants of Health   Financial Resource Strain: Not on file  Food Insecurity: No Food Insecurity (12/09/2022)   Hunger Vital Sign    Worried About Running Out of Food in the Last Year: Never true    Ran Out of Food in the Last Year: Never true  Transportation Needs: Not on file  Physical Activity: Not on file  Stress:  Not on file  Social Connections: Not on file   Allergies  Allergen Reactions   Sulfa Antibiotics Nausea And Vomiting   Cipro [Ciprofloxacin Hcl] Nausea And Vomiting   Effexor [Venlafaxine Hcl] Other (See Comments)    Excessive sleeping, slept for 2 days    Medications   (Not in a hospital admission)    No current facility-administered medications for this encounter.  Current Outpatient Medications:    albuterol (PROVENTIL HFA;VENTOLIN HFA) 108 (90 Base) MCG/ACT inhaler, Inhale 2 puffs into the lungs every 6 (six) hours as needed for wheezing or shortness of breath., Disp: 1 Inhaler, Rfl: 1   ALPRAZolam (XANAX) 0.25 MG tablet, Take 1 tablet (0.25 mg total) by mouth 3 (three) times daily as needed for anxiety., Disp: 60 tablet, Rfl: 0   aspirin EC 81 MG tablet, Take 81 mg by mouth daily. Swallow whole., Disp: , Rfl:    atorvastatin (LIPITOR) 20 MG tablet, TAKE ONE TABLET (20 MG TOTAL) BY MOUTH DAILY., Disp: 30 tablet, Rfl: 11   butalbital-acetaminophen-caffeine (FIORICET) 50-325-40 MG tablet, Take 1 tablet by mouth daily as needed for headache., Disp: 14 tablet, Rfl: 0   DULoxetine (CYMBALTA) 60 MG capsule, Take 1 capsule (60 mg total) by mouth daily., Disp: 30 capsule, Rfl: 3   lisinopril-hydrochlorothiazide (ZESTORETIC) 20-12.5 MG tablet, Take 1 tablet by mouth daily., Disp: 90 tablet, Rfl: 3   mometasone (ELOCON) 0.1 % ointment, APPLY TWICE DAILY AS NEEDED FOR PSORIASIS, Disp: 45 g, Rfl: 1   montelukast (SINGULAIR) 10 MG tablet, Take 1 tablet (10 mg total) by mouth at bedtime., Disp: 90 tablet, Rfl: 3   ondansetron (ZOFRAN-ODT) 4 MG disintegrating tablet, Take 1 tablet (4 mg total) by mouth every 8 (eight) hours as needed., Disp: 20 tablet, Rfl: 0   rizatriptan (MAXALT-MLT) 10 MG disintegrating tablet, Take 1 tablet (10 mg total) by mouth as needed for migraine. May repeat in 2 hours if needed, Disp: 10 tablet, Rfl: 0   traMADol (ULTRAM) 50 MG tablet, Take 1 tablet (50 mg total) by  mouth 2 (two) times daily as needed., Disp: 30 tablet, Rfl: 0  Vitals   Vitals:   02/19/23 1000 02/19/23 1030 02/19/23 1100 02/19/23 1130  BP: (!) 155/108 (!) 156/90 (!) 154/100 (!) 154/100  Pulse: 86 92 84 86  Resp: 17  18 17   Temp:      SpO2: 96% 96% 97% 97%     There is no height or weight on file to calculate  BMI.  Physical Exam   Physical Exam Gen: A&O x4, NAD HEENT: Atraumatic, normocephalic;mucous membranes moist; oropharynx clear, tongue without atrophy or fasciculations. Neck: Supple, trachea midline. Resp: CTAB, no w/r/r CV: RRR, no m/g/r; nml S1 and S2. 2+ symmetric peripheral pulses. Abd: soft/NT/ND; nabs x 4 quad Extrem: Nml bulk; no cyanosis, clubbing, or edema.  Neuro: *MS: A&O x4. Follows multi-step commands.  *Speech: stuttering, dysarthric, able to name and repeat *CN:    I: Deferred   II,III: PERRLA, VFF by confrontation, optic discs unable to be visualized 2/2 pupillary constriction   III,IV,VI: EOMI w/o nystagmus, no ptosis   V: Sensation intact from V1 to V3 to LT   VII: Eyelid closure was full.  Smile symmetric.   VIII: Hearing intact to voice   IX,X: Voice normal, palate elevates symmetrically    XI: SCM/trap 5/5 bilat   XII: Tongue protrudes midline, no atrophy or fasciculations   *Motor:   Normal bulk.  No tremor, rigidity or bradykinesia. No pronator drift. BUE no drift, drift but not to bed LLE which then resolved and subsequently she had drift in the RLE. *Sensory: Intact to light touch, pinprick, temperature vibration throughout. Symmetric. Propioception intact bilat.  No double-simultaneous extinction.  *Coordination:  Finger-to-nose, heel-to-shin, rapid alternating motions were intact. *Reflexes:  2+ and symmetric throughout without clonus; toes down-going bilat *Gait: deferred  NIHSS = 2 for dysarthria and leg drift  Premorbid mRS = 0   Labs   CBC:  Recent Labs  Lab 02/18/23 0850 02/18/23 0900 02/19/23 0935  WBC 6.8  --  6.7   NEUTROABS 4.0  --  3.7  HGB 13.9 14.3 13.5  HCT 43.2 42.0 42.6  MCV 87.6  --  88.8  PLT 221  --  245    Basic Metabolic Panel:  Lab Results  Component Value Date   NA 135 02/19/2023   K 3.7 02/19/2023   CO2 24 02/19/2023   GLUCOSE 83 02/19/2023   BUN 13 02/19/2023   CREATININE 0.78 02/19/2023   CALCIUM 8.3 (L) 02/19/2023   GFRNONAA >60 02/19/2023   GFRAA >60 10/13/2019   Lipid Panel:  Lab Results  Component Value Date   LDLCALC 131 (H) 12/05/2022   HgbA1c:  Lab Results  Component Value Date   HGBA1C 4.9 12/05/2022   Urine Drug Screen:     Component Value Date/Time   LABOPIA NONE DETECTED 02/18/2023 0847   COCAINSCRNUR NONE DETECTED 02/18/2023 0847   LABBENZ POSITIVE (A) 02/18/2023 0847   AMPHETMU NONE DETECTED 02/18/2023 0847   THCU NONE DETECTED 02/18/2023 0847   LABBARB NONE DETECTED 02/18/2023 0847    Alcohol Level     Component Value Date/Time   [facility] <10 02/19/2023 0935     Impression   This is a 42 yo woman with hx punctate ischemic infarct in June 2024, presentation to [facility] yesterday with functional neurologic sx who presents with similar stuttering speech pattern and variable weakness in the setting of migraine. We had a long talk about her sx and she agreed that when her speech becomes slightly slurred in the setting of migraine she becomes very anxious that she is having another stroke and then she starts to stutter and develop additional sx. The functional component of her sx seemed to resonate with her and she agreed that therapy would be helpful to ease her sx. After a headache cocktail her sx resolved.  Recommendations   - Cancel stroke code - OK to discharge from ED, no further  workup ______________________________________________________________________   Thank you for the opportunity to take part in the care of this patient. If you have any further questions, please contact the neurology consultation attending.  Signed,  Bing Neighbors,  MD [facility] [phone]  If 7pm- 7am, please page neurology on call as listed in AMION.  **Any copied and pasted documentation in this note was written by me in another application not billed for and pasted by me into this document.

## 2023-02-24 ENCOUNTER — Other Ambulatory Visit: Payer: Self-pay

## 2023-02-24 ENCOUNTER — Encounter: Payer: Self-pay | Admitting: Emergency Medicine

## 2023-02-24 ENCOUNTER — Emergency Department: Payer: No Typology Code available for payment source

## 2023-02-24 ENCOUNTER — Emergency Department
Admission: EM | Admit: 2023-02-24 | Discharge: 2023-02-24 | Disposition: A | Discharge status: Home / Self Care | Payer: No Typology Code available for payment source | Attending: Emergency Medicine | Admitting: Emergency Medicine

## 2023-02-24 DIAGNOSIS — Z8673 Personal history of transient ischemic attack (TIA), and cerebral infarction without residual deficits: Secondary | ICD-10-CM | POA: Insufficient documentation

## 2023-02-24 DIAGNOSIS — G43909 Migraine, unspecified, not intractable, without status migrainosus: Secondary | ICD-10-CM | POA: Diagnosis not present

## 2023-02-24 DIAGNOSIS — R519 Headache, unspecified: Secondary | ICD-10-CM | POA: Diagnosis present

## 2023-02-24 LAB — URINALYSIS, ROUTINE W REFLEX MICROSCOPIC
Bilirubin Urine: NEGATIVE
Glucose, UA: NEGATIVE mg/dL
Ketones, ur: NEGATIVE mg/dL
Leukocytes,Ua: NEGATIVE
Nitrite: NEGATIVE
Protein, ur: NEGATIVE mg/dL
Specific Gravity, Urine: NEGATIVE (ref 1.005–1.030)
Squamous Epithelial / HPF: NONE SEEN /HPF (ref 0–5)
pH: 6.5 (ref 5.0–8.0)

## 2023-02-24 LAB — COMPREHENSIVE METABOLIC PANEL
ALT: 50 U/L — ABNORMAL HIGH (ref 0–44)
AST: 33 U/L (ref 15–41)
Albumin: 3.5 g/dL (ref 3.5–5.0)
Alkaline Phosphatase: 153 U/L — ABNORMAL HIGH (ref 38–126)
Anion gap: 9 (ref 5–15)
BUN: 15 mg/dL (ref 6–20)
CO2: 26 mmol/L (ref 22–32)
Calcium: 9 mg/dL (ref 8.9–10.3)
Chloride: 102 mmol/L (ref 98–111)
Creatinine, Ser: 0.9 mg/dL (ref 0.44–1.00)
GFR, Estimated: >60 mL/min (ref >60)
Glucose, Bld: 102 mg/dL — ABNORMAL HIGH (ref 70–99)
Potassium: 3.5 mmol/L (ref 3.5–5.1)
Sodium: 137 mmol/L (ref 135–145)
Total Bilirubin: 0.4 mg/dL (ref 0.3–1.2)
Total Protein: 7.7 g/dL (ref 6.5–8.1)

## 2023-02-24 LAB — CBC
HCT: 44.1 % (ref 36.0–46.0)
Hemoglobin: 14.5 g/dL (ref 12.0–15.0)
MCH: 28 pg (ref 26.0–34.0)
MCHC: 32.9 g/dL (ref 30.0–36.0)
MCV: 85.3 fL (ref 80.0–100.0)
Platelets: 272 10*3/uL (ref 150–400)
RBC: 5.17 MIL/uL — ABNORMAL HIGH (ref 3.87–5.11)
RDW: 13 % (ref 11.5–15.5)
WBC: 6.5 10*3/uL (ref 4.0–10.5)
nRBC: 0 % (ref 0.0–0.2)

## 2023-02-24 LAB — DIFFERENTIAL
Abs Immature Granulocytes: 0.02 10*3/uL (ref 0.00–0.07)
Basophils Absolute: 0 10*3/uL (ref 0.0–0.1)
Basophils Relative: 1 %
Eosinophils Absolute: 0.2 10*3/uL (ref 0.0–0.5)
Eosinophils Relative: 3 %
Immature Granulocytes: 0 %
Lymphocytes Relative: 28 %
Lymphs Abs: 1.8 10*3/uL (ref 0.7–4.0)
Monocytes Absolute: 0.4 10*3/uL (ref 0.1–1.0)
Monocytes Relative: 6 %
Neutro Abs: 4 10*3/uL (ref 1.7–7.7)
Neutrophils Relative %: 62 %

## 2023-02-24 LAB — URINE DRUG SCREEN, QUALITATIVE (ARMC ONLY)
Amphetamines, Ur Screen: NOT DETECTED
Barbiturates, Ur Screen: POSITIVE — AB
Benzodiazepine, Ur Scrn: POSITIVE — AB
Cannabinoid 50 Ng, Ur ~~LOC~~: NOT DETECTED
Cocaine Metabolite,Ur ~~LOC~~: NOT DETECTED
MDMA (Ecstasy)Ur Screen: NOT DETECTED
Methadone Scn, Ur: NOT DETECTED
Opiate, Ur Screen: POSITIVE — AB
Phencyclidine (PCP) Ur S: NOT DETECTED
Tricyclic, Ur Screen: POSITIVE — AB

## 2023-02-24 LAB — ETHANOL: Alcohol, Ethyl (B): <10 mg/dL (ref <10)

## 2023-02-24 LAB — APTT: aPTT: 27 s (ref 24–36)

## 2023-02-24 LAB — PROTIME-INR
INR: 1 (ref 0.8–1.2)
Prothrombin Time: 13.5 s (ref 11.4–15.2)

## 2023-02-24 MED ORDER — ACETAMINOPHEN 500 MG PO TABS
1000.0000 mg | ORAL_TABLET | Freq: Once | ORAL | Status: AC
  Administered 2023-02-24: 1000 mg via ORAL
  Filled 2023-02-24: qty 2

## 2023-02-24 MED ORDER — METOCLOPRAMIDE HCL 5 MG/ML IJ SOLN
10.0000 mg | Freq: Once | INTRAMUSCULAR | Status: AC
  Administered 2023-02-24: 10 mg via INTRAVENOUS
  Filled 2023-02-24: qty 2

## 2023-02-24 MED ORDER — SODIUM CHLORIDE 0.9 % IV BOLUS
500.0000 mL | Freq: Once | INTRAVENOUS | Status: AC
  Administered 2023-02-24: 500 mL via INTRAVENOUS

## 2023-02-24 MED ORDER — DIPHENHYDRAMINE HCL 50 MG/ML IJ SOLN
25.0000 mg | Freq: Once | INTRAMUSCULAR | Status: AC
  Administered 2023-02-24: 25 mg via INTRAVENOUS
  Filled 2023-02-24: qty 1

## 2023-02-24 NOTE — ED Notes (Signed)
RN unsuccessful at IV at this time.

## 2023-02-24 NOTE — ED Triage Notes (Signed)
First Nurse Note;  Pt via POV from [facility]. Pt c/o L sided headache, trouble getting her words out, and vomiting. Pt has a hx of complex migraines and have been here multiple times for same. Pt also has a hx of stroke in June 2024. Pt is A&Ox4 and NAD

## 2023-02-24 NOTE — ED Triage Notes (Signed)
Patient to ED via Pov from [facility] for headache x3 with stroke like symptoms. States she has hx of complex migraines and stroke. States she's has had trouble getting her words out since Sunday evening and woke up this AM with left sided facial numbness and left arm numbness. LKW yesterday at 71. Also having vomiting that started yesterday.

## 2023-02-24 NOTE — ED Provider Notes (Signed)
[facility] Provider Note    Event Date/Time   First MD Initiated Contact with Patient 02/24/23 [id number]     (approximate)   History   Headache   HPI  Bridget Maldonado is a 42 y.o. female   Past medical history of stroke in June 2024, complex migraines, bipolar, anxiety with panic attacks, who presents to the emergency department with left-sided headache associated with stuttering speech, left-sided arm and leg numbness.  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  Symptoms resolved with migraine cocktail.  This started on late Sunday/early Monday best approximation last known normal was approximately 1 AM on Monday morning, mild at first but increasing over the last couple of days.  Associated neurologic symptoms along with her left-sided headache are similar to her complex migraines in the past.  She has also associated nausea and vomiting.    She denies any chest pain, respiratory symptoms, abdominal pain, or urinary symptoms.    No trauma.   External Medical Documents Reviewed: Neurology consult note dated 02/19/2023 for complex migraine      Physical Exam   Triage Vital Signs: ED Triage Vitals [02/24/23 0956]  Encounter Vitals Group     BP (!) 147/107     Systolic BP Percentile      Diastolic BP Percentile      Pulse Rate (!) 121     Resp 18     Temp 99.1 F (37.3 C)     Temp Source Oral     SpO2 98 %     Weight 225 lb (102.1 kg)     Height 5\' 4"  (1.626 m)     Head Circumference      Peak Flow      Pain Score 8     Pain Loc      Pain Education      Exclude from Growth Chart     Most recent vital signs: Vitals:    02/24/23 1230 02/24/23 1300  BP: (!) 162/116 (!) 150/108  Pulse: 79 80  Resp: 19 20  Temp:    SpO2: 99% 100%    General: Awake, no distress.  CV:  Good peripheral perfusion.  Resp:  Normal effort.  Abd:  No distention. Other:  Stuttering speech but awake alert oriented following commands.  Tachycardic low 100s and hypertensive afebrile.  Soft nontender abdomen and clear lungs.  Moving all extremities with full active range of motion, good motor strength throughout equal no facial asymmetry.  She does state it feels numb on the left side of her face, left arm, left leg.   ED Results / Procedures / Treatments   Labs (all labs ordered are listed, but only abnormal results are displayed) Labs Reviewed  CBC - Abnormal; Notable for the following components:      Result Value   RBC 5.17 (*)    All other components within normal limits  COMPREHENSIVE METABOLIC PANEL - Abnormal; Notable for the following components:   Glucose, Bld 102 (*)    ALT 50 (*)    Alkaline Phosphatase 153 (*)    All other components within normal limits  URINE DRUG SCREEN, QUALITATIVE [facility]  ONLY) - Abnormal; Notable for the following components:   Tricyclic, Ur Screen POSITIVE (*)    Opiate, Ur Screen POSITIVE (*)    Barbiturates, Ur Screen POSITIVE (*)    Benzodiazepine, Ur Scrn POSITIVE (*)    All other components within normal limits  URINALYSIS, ROUTINE W REFLEX MICROSCOPIC - Abnormal; Notable for the following components:   APPearance CLEAR (*)    Hgb urine dipstick TRACE (*)    Bacteria, UA RARE (*)    All other components within normal limits  ETHANOL  PROTIME-INR  APTT  DIFFERENTIAL     I ordered and reviewed the above labs they are notable for utox positive for tricyclic, opiates, barbiturates, benzodiazepines  EKG  ED ECG REPORT I, Pilar Jarvis, the attending physician, personally viewed and interpreted this ECG.   Date: 02/24/2023  EKG Time: 0956  Rate: 117  Rhythm: sinus  tachycardia  Axis: nl  Intervals:none  ST&T Change: no stemi    RADIOLOGY I independently reviewed and interpreted CT of the head and see no obvious bleeding or midline shift I also reviewed radiologist's formal read.   PROCEDURES:  Critical Care performed: No  Procedures   MEDICATIONS ORDERED IN ED: Medications  metoCLOPramide (REGLAN) injection 10 mg (10 mg Intravenous Given 02/24/23 1103)  diphenhydrAMINE (BENADRYL) injection 25 mg (25 mg Intravenous Given 02/24/23 1103)  acetaminophen (TYLENOL) tablet 1,000 mg (1,000 mg Oral Given 02/24/23 1103)  sodium chloride 0.9 % bolus 500 mL (0 mLs Intravenous Stopped 02/24/23 1325)     IMPRESSION / MDM / ASSESSMENT AND PLAN / ED COURSE  I reviewed the triage vital signs and the nursing notes.                                Patient's presentation is most consistent with acute presentation with potential threat to life or bodily function.  Differential diagnosis includes, but is not limited to, complex migraine, stroke, ICH, functional neurologic disorder   The patient is on the cardiac monitor to evaluate for evidence of arrhythmia and/or significant heart rate changes.  MDM:    With recent stroke and complex migraines with recurrent headache along with neurologic symptoms consistent with her complex migraine in the past including numbness and stuttering speech.  She is outside of the thrombolytic window as well as recent stroke contraindication and there was no evidence of LVO so will not call stroke code at this time will evaluate with a CT scan of the head for ICH, treat migraine with migraine cocktail and reassess symptoms.  -- Markedly better headache and speech after migraine cocktail.  Still little bit of stuttering speech, numbness is gone.  Given persistent neurologic symptoms, considered and offered neurologic consultation and admission but patient declined stating she feels better and this is consistent with complex migraine  in the past.  Plan for discharge.          FINAL CLINICAL IMPRESSION(S) / ED DIAGNOSES   Final diagnoses:  Migraine without status migrainosus, not intractable, unspecified migraine type     Rx / DC Orders   ED Discharge Orders     None        Note:  This document was prepared using Dragon voice recognition software and may include unintentional dictation errors.    Pilar Jarvis, MD 02/24/23 [phone]

## 2023-02-24 NOTE — Discharge Instructions (Signed)
Take acetaminophen 650 mg and ibuprofen 400 mg every 6 hours for pain.  Take with food.  Call Dr Sherryll Burger for an appointment.   Thank you for choosing [place] for your health care today!  Please see your primary doctor this week for a follow up appointment.   If you have any new, worsening, or unexpected symptoms call your doctor right away or come back to the emergency department for reevaluation.  It was my pleasure to care for you today.   Daneil Dan Modesto Charon, MD

## 2023-02-25 ENCOUNTER — Encounter: Payer: 59 | Admitting: Speech Pathology

## 2023-02-25 ENCOUNTER — Ambulatory Visit: Payer: 59

## 2023-03-03 ENCOUNTER — Other Ambulatory Visit: Payer: Self-pay | Admitting: Internal Medicine

## 2023-03-03 MED ORDER — TRAMADOL HCL 50 MG PO TABS: 50.0000 mg | ORAL_TABLET | Freq: Two times a day (BID) | ORAL | 0 refills | Status: DC | PRN

## 2023-03-18 ENCOUNTER — Other Ambulatory Visit: Payer: Self-pay | Admitting: Internal Medicine

## 2023-03-18 NOTE — Telephone Encounter (Signed)
Last OV: 02/11/2023 Pending OV: 09/15/2023 Medication Alprazolam 0.25mg  Directions: 1 po TID prn Last Refill: 02/11/2023 Qty: #60 with 0 refills

## 2023-03-19 MED ORDER — ALPRAZOLAM 0.25 MG PO TABS: 0.2500 mg | ORAL_TABLET | Freq: Three times a day (TID) | ORAL | 0 refills | Status: DC | PRN

## 2023-03-20 ENCOUNTER — Other Ambulatory Visit: Payer: Self-pay

## 2023-03-20 ENCOUNTER — Emergency Department
Admission: EM | Admit: 2023-03-20 | Discharge: 2023-03-20 | Disposition: A | Discharge status: Home / Self Care | Payer: No Typology Code available for payment source | Attending: Emergency Medicine | Admitting: Emergency Medicine

## 2023-03-20 DIAGNOSIS — I10 Essential (primary) hypertension: Secondary | ICD-10-CM | POA: Diagnosis not present

## 2023-03-20 DIAGNOSIS — W540XXA Bitten by dog, initial encounter: Secondary | ICD-10-CM | POA: Insufficient documentation

## 2023-03-20 DIAGNOSIS — Z23 Encounter for immunization: Secondary | ICD-10-CM | POA: Insufficient documentation

## 2023-03-20 DIAGNOSIS — Z203 Contact with and (suspected) exposure to rabies: Secondary | ICD-10-CM | POA: Insufficient documentation

## 2023-03-20 DIAGNOSIS — S0181XA Laceration without foreign body of other part of head, initial encounter: Secondary | ICD-10-CM | POA: Diagnosis not present

## 2023-03-20 DIAGNOSIS — Z2914 Encounter for prophylactic rabies immune globin: Secondary | ICD-10-CM | POA: Diagnosis not present

## 2023-03-20 DIAGNOSIS — S0990XA Unspecified injury of head, initial encounter: Secondary | ICD-10-CM | POA: Diagnosis present

## 2023-03-20 MED ORDER — RABIES IMMUNE GLOBULIN 1500 UNIT/10ML IJ SOLN
2100.0000 [IU] | Freq: Once | INTRAMUSCULAR | Status: AC
  Administered 2023-03-20: 2100 [IU] via INTRAMUSCULAR
  Filled 2023-03-20: qty 4

## 2023-03-20 MED ORDER — AMOXICILLIN-POT CLAVULANATE 875-125 MG PO TABS
1.0000 | ORAL_TABLET | Freq: Once | ORAL | Status: AC
  Administered 2023-03-20: 1 via ORAL
  Filled 2023-03-20: qty 1

## 2023-03-20 MED ORDER — TETANUS-DIPHTH-ACELL PERTUSSIS 5-2.5-18.5 LF-MCG/0.5 IM SUSY
0.5000 mL | PREFILLED_SYRINGE | Freq: Once | INTRAMUSCULAR | Status: AC
  Administered 2023-03-20: 0.5 mL via INTRAMUSCULAR
  Filled 2023-03-20: qty 0.5

## 2023-03-20 MED ORDER — BACITRACIN 500 UNIT/GM EX OINT: 1.0000 | TOPICAL_OINTMENT | Freq: Two times a day (BID) | CUTANEOUS | 3 refills | Status: DC

## 2023-03-20 MED ORDER — RABIES VACCINE, PCEC IM SUSR
1.0000 mL | Freq: Once | INTRAMUSCULAR | Status: AC
  Administered 2023-03-20: 1 mL via INTRAMUSCULAR
  Filled 2023-03-20: qty 1

## 2023-03-20 MED ORDER — OXYCODONE-ACETAMINOPHEN 5-325 MG PO TABS
1.0000 | ORAL_TABLET | Freq: Once | ORAL | Status: AC
  Administered 2023-03-20: 1 via ORAL
  Filled 2023-03-20: qty 1

## 2023-03-20 MED ORDER — RABIES IMMUNE GLOBULIN 150 UNIT/ML IM INJ
20.0000 [IU]/kg | INJECTION | Freq: Once | INTRAMUSCULAR | Status: DC
  Filled 2023-03-20: qty 14

## 2023-03-20 MED ORDER — BACITRACIN ZINC 500 UNIT/GM EX OINT
TOPICAL_OINTMENT | Freq: Two times a day (BID) | CUTANEOUS | Status: DC
  Filled 2023-03-20: qty 1.8

## 2023-03-20 MED ORDER — AMOXICILLIN-POT CLAVULANATE 875-125 MG PO TABS: 1.0000 | ORAL_TABLET | Freq: Two times a day (BID) | ORAL | 0 refills | Status: AC

## 2023-03-20 MED ORDER — OXYCODONE-ACETAMINOPHEN 5-325 MG PO TABS
1.0000 | ORAL_TABLET | Freq: Four times a day (QID) | ORAL | 0 refills | Status: AC | PRN
Start: 2023-03-20 — End: 2023-03-23

## 2023-03-20 MED ORDER — RABIES IMMUNE GLOBULIN 150 UNIT/ML IM INJ: 20.0000 [IU]/kg | INJECTION | Freq: Once | INTRAMUSCULAR | Status: DC

## 2023-03-20 NOTE — ED Provider Notes (Signed)
[facility] Provider Note    Event Date/Time   First MD Initiated Contact with Patient 03/20/23 1500     (approximate)   History   Animal Bite   HPI  Bridget Maldonado is a 42 y.o. female with PMH of hypertension and anxiety who presents for evaluation after being bit by a dog earlier today.  Patient states that the stray dog was let go in front of her work, she was outside petting the dog and giving it water when it jumped up and bit her in the chin.  She did call animal control.  She has an open wound to the right side of her chin and reports significant pain to the area.      Physical Exam   Triage Vital Signs: ED Triage Vitals  Encounter Vitals Group     BP 03/20/23 1431 (!) 140/129     Systolic BP Percentile --      Diastolic BP Percentile --      Pulse Rate 03/20/23 1431 (!) 122     Resp 03/20/23 1431 18     Temp 03/20/23 1431 98.8 F (37.1 C)     Temp src --      SpO2 03/20/23 1431 100 %     Weight --      Height --      Head Circumference --      Peak Flow --      Pain Score 03/20/23 1430 4     Pain Loc --      Pain Education --      Exclude from Growth Chart --     Most recent vital signs: Vitals:   03/20/23 1431  BP: (!) 140/129  Pulse: (!) 122  Resp: 18  Temp: 98.8 F (37.1 C)  SpO2: 100%    General: Awake, no distress.  CV:  Good peripheral perfusion.  Resp:  Normal effort.  Abd:  No distention.  Other:  3 cm circular wound to the right side of patient's chin, bleeding is controlled, approx 3mm deep.   ED Results / Procedures / Treatments   Labs (all labs ordered are listed, but only abnormal results are displayed) Labs Reviewed - No data to display   PROCEDURES:  Critical Care performed: No  ..Laceration Repair  Date/Time: 03/20/2023 4:31 PM  Performed by: Cameron Ali, PA-C Authorized by: Cameron Ali, PA-C   Consent:    Consent obtained:  Verbal   Consent given by:  Patient   Risks,  benefits, and alternatives were discussed: yes     Risks discussed:  Infection, pain, poor cosmetic result and poor wound healing   Alternatives discussed:  No treatment Universal protocol:    Patient identity confirmed:  Verbally with patient Anesthesia:    Anesthesia method:  None Laceration details:    Location:  Face   Face location:  Chin   Length (cm):  3   Depth (mm):  3 Exploration:    Hemostasis achieved with:  Direct pressure Treatment:    Irrigation solution:  Sterile saline   Irrigation volume:  50 mL   Irrigation method:  Syringe Post-procedure details:    Dressing:  Antibiotic ointment, adhesive bandage and non-adherent dressing   Procedure completion:  Tolerated    MEDICATIONS ORDERED IN ED: Medications  bacitracin ointment (has no administration in time range)  rabies immune globulin (HYPERRAB/KEDRAB) 2,100 Units (has no administration in time range)  rabies vaccine (RABAVERT) injection 1 mL (  1 mL Intramuscular Given 03/20/23 1549)  amoxicillin-clavulanate (AUGMENTIN) 875-125 MG per tablet 1 tablet (1 tablet Oral Given 03/20/23 1545)  Tdap (BOOSTRIX) injection 0.5 mL (0.5 mLs Intramuscular Given 03/20/23 1546)  oxyCODONE-acetaminophen (PERCOCET/ROXICET) 5-325 MG per tablet 1 tablet (1 tablet Oral Given 03/20/23 1545)     IMPRESSION / MDM / ASSESSMENT AND PLAN / ED COURSE  I reviewed the triage vital signs and the nursing notes.                             43 year old presents for evaluation of a dog bite to her chin.  Patient was hypertensive and tachycardic in triage, otherwise vital signs are stable.  Patient has calmed down since triage and is in no acute distress now.  Differential diagnosis includes, but is not limited to, abrasion, need for tetanus prophylaxis, need for rabies prophylaxis.  Patient's presentation is most consistent with acute, uncomplicated illness.  Patient's wound on her chin is missing a large amount of tissue and cannot be closed by  primary intention.  Wound was irrigated profusely, then dressed with bacitracin ointment, nonadherent gauze and a bandage.  Patient was instructed on wound care at home.  I will also send her a course of oral antibiotics, she received her first dose while in the ED.  Patient wanted to have the rabies series.  I educated her on what this process involves.  She is aware that she will need to have 3 more vaccines and the dates that she needs to get them on.  Patient's tetanus shot was also updated today.  She was given pain medication while in the ED and I will also send her home with a couple days of this.  All questions were answered and patient was stable at discharge.     FINAL CLINICAL IMPRESSION(S) / ED DIAGNOSES   Final diagnoses:  Dog bite of face, initial encounter  Need for post exposure prophylaxis for rabies     Rx / DC Orders   ED Discharge Orders          Ordered    amoxicillin-clavulanate (AUGMENTIN) 875-125 MG tablet  2 times daily        03/20/23 1559    oxyCODONE-acetaminophen (PERCOCET) 5-325 MG tablet  Every 6 hours PRN        03/20/23 1559    bacitracin 500 UNIT/GM ointment  2 times daily        03/20/23 1631             Note:  This document was prepared using Dragon voice recognition software and may include unintentional dictation errors.   Cameron Ali, PA-C 03/20/23 1632    Sharman Cheek, MD 03/20/23 1815

## 2023-03-20 NOTE — Discharge Instructions (Addendum)
Your tetanus shot was updated today.  You received your first dose of the rabies vaccine in the ED today, this is day 0.  You will need to have 3 more doses, which will be completed on day 3, day 7 and day 14.  You can get the vaccines at this ED or at the following urgent cares:  [facility] at [facility] [address] Open Monday through Friday 8 AM to 4 PM with online scheduling or walk-in.  [facility] at [facility] [address] Open Monday through Friday 8 AM to 8 PM, Saturday and Sunday 8 AM to 4 PM with online scheduling or walk-in.  You will need to change the dressing on your wound daily.  It is important that the wound stays moist.  This will be accomplished by applying triple antibiotic ointment to it.  Please place a layer of the antibiotic ointment over the wound then cover with a nonadherent gauze and a bandage.  You will need to complete regular dressing changes for several weeks. The wound will heal by secondary intention, you can look this term up on google to see what the process will look like.  I have attached information for the wound clinic, you can call and schedule an appointment.   Watch for signs of infection including redness, warmth, swelling, pain and pus drainage.  If you develop any of these please return to the ED, urgent care or your primary care provider.  Please take the antibiotics as prescribed. You can take the percocet every 6 hours as needed for pain. Please keep in mind that you cannot drive after taking this medication.

## 2023-03-20 NOTE — ED Triage Notes (Signed)
Pt comes with c/o animal bite to chin. Pt states she was petting dog and giving water. The dog bit her face. Animal control was called and was responding to scene to get the dog.   Pt has bandage over chi, bleeding controlled. Pt has open wound to stop

## 2023-03-23 ENCOUNTER — Ambulatory Visit
Admission: EM | Admit: 2023-03-23 | Discharge: 2023-03-23 | Disposition: A | Discharge status: Home / Self Care | Payer: No Typology Code available for payment source | Attending: Emergency Medicine | Admitting: Emergency Medicine

## 2023-03-23 DIAGNOSIS — Z203 Contact with and (suspected) exposure to rabies: Secondary | ICD-10-CM

## 2023-03-23 MED ORDER — RABIES VACCINE, PCEC IM SUSR
1.0000 mL | Freq: Once | INTRAMUSCULAR | Status: AC
  Administered 2023-03-23: 1 mL via INTRAMUSCULAR

## 2023-03-23 NOTE — ED Triage Notes (Signed)
Patient to Urgent Care for second rabavert shot in rabies series.  Reports first vaccines received in ER 9/27 after dog bite. Tolerated vaccines well with no other complaints.

## 2023-03-27 ENCOUNTER — Ambulatory Visit
Admission: EM | Admit: 2023-03-27 | Discharge: 2023-03-27 | Disposition: A | Discharge status: Home / Self Care | Payer: No Typology Code available for payment source | Attending: Emergency Medicine

## 2023-03-27 DIAGNOSIS — Z23 Encounter for immunization: Secondary | ICD-10-CM | POA: Diagnosis not present

## 2023-03-27 DIAGNOSIS — Z203 Contact with and (suspected) exposure to rabies: Secondary | ICD-10-CM | POA: Diagnosis not present

## 2023-03-27 MED ORDER — RABIES VACCINE, PCEC IM SUSR
1.0000 mL | Freq: Once | INTRAMUSCULAR | Status: AC
  Administered 2023-03-27: 1 mL via INTRAMUSCULAR

## 2023-03-27 NOTE — ED Triage Notes (Signed)
Patient to Urgent Care for third rabavert shot in rabies series.  Second vaccine received at this location, 9/30 following dog bite. Tolerated other vaccines well with no other complaints.

## 2023-03-30 ENCOUNTER — Encounter: Payer: No Typology Code available for payment source | Attending: Physician Assistant | Admitting: Physician Assistant

## 2023-03-30 DIAGNOSIS — W540XXA Bitten by dog, initial encounter: Secondary | ICD-10-CM | POA: Diagnosis not present

## 2023-03-30 DIAGNOSIS — I10 Essential (primary) hypertension: Secondary | ICD-10-CM | POA: Diagnosis not present

## 2023-03-30 DIAGNOSIS — J45909 Unspecified asthma, uncomplicated: Secondary | ICD-10-CM | POA: Diagnosis not present

## 2023-03-30 DIAGNOSIS — L98492 Non-pressure chronic ulcer of skin of other sites with fat layer exposed: Secondary | ICD-10-CM | POA: Diagnosis present

## 2023-03-30 DIAGNOSIS — S0185XA Open bite of other part of head, initial encounter: Secondary | ICD-10-CM | POA: Insufficient documentation

## 2023-03-31 NOTE — Progress Notes (Signed)
Bridget Maldonado ([id number]) [phone] [email or web address] Page 1 of 5 Visit Report for 03/30/2023 Abuse Risk Screen Details Patient Name: Date of Service: Bridget Maldonado, Bridget Maldonado 03/30/2023 2:00 PM Medical Record Number: [id number] Patient Account Number: [id number] Date of Birth/Sex: Treating RN: 1981-03-12 (42 y.o. [name] Primary Care Chariah Bailey: Tillman Abide Other Clinician: Referring Jaymir Struble: Treating Ayjah Show/Extender: Eliott Nine in Treatment: 0 Abuse Risk Screen Items Answer Electronic Signature(s) Signed: 03/31/2023 5:02:18 PM By: Midge Aver MSN RN CNS WTA Entered By: Midge Aver on 03/30/2023 14:16:39 -------------------------------------------------------------------------------- Activities of Daily Living Details Patient Name: Date of Service: Bridget Maldonado, Bridget Maldonado 03/30/2023 2:00 PM Medical Record Number: [id number] Patient Account Number: [id number] Date of Birth/Sex: Treating RN: [birth year] (42 y.o. [name] Primary Care Deane Wattenbarger: Tillman Abide Other Clinician: Referring Naol Ontiveros: Treating Zyanne Schumm/Extender: Eliott Nine in Treatment: 0 Activities of Daily Living Items Answer Activities of Daily Living (Please select one for each item) Drive Automobile Completely Able T Medications ake Completely Able Use T elephone Completely Able Care for Appearance Completely Able Use T oilet Completely Able Bath / Shower Completely Able Dress Self Completely Able Feed Self Completely Able Walk Completely Able Get In / Out Bed Completely Able Housework Completely Able Prepare Meals Completely Able Handle Money Completely Able Shop for Self Completely Bridget Maldonado ([id number]) [phone] [id number] Page 2 of 5 Electronic Signature(s) Signed: 03/31/2023 5:02:18 PM By: Midge Aver MSN RN CNS WTA Entered By: Midge Aver on 03/30/2023  14:17:01 -------------------------------------------------------------------------------- Education Screening Details Patient Name: Date of Service: Bridget Lex. 03/30/2023 2:00 PM Medical Record Number: [id number] Patient Account Number: [id number] Date of Birth/Sex: Treating RN: 1980/12/03 (42 y.o. [name] Primary Care Aquil Duhe: Tillman Abide Other Clinician: Referring Jesusita Jocelyn: Treating Taryll Reichenberger/Extender: Eliott Nine in Treatment: 0 Learning Preferences/Education Level/Primary Language Learning Preference: Explanation, Demonstration Preferred Language: English Cognitive Barrier Language Barrier: No Translator Needed: No Memory Deficit: No Emotional Barrier: No Cultural/Religious Beliefs Affecting Medical Care: No Physical Barrier Impaired Vision: No Impaired Hearing: No Decreased Hand dexterity: No Knowledge/Comprehension Knowledge Level: High Comprehension Level: High Ability to understand written instructions: High Ability to understand verbal instructions: High Motivation Anxiety Level: Calm Cooperation: Cooperative Education Importance: Acknowledges Need Interest in Health Problems: Asks Questions Perception: Coherent Willingness to Engage in Self-Management High Activities: Readiness to Engage in Self-Management High Activities: Electronic Signature(s) Signed: 03/31/2023 5:02:18 PM By: Midge Aver MSN RN CNS WTA Entered By: Midge Aver on 03/30/2023 14:17:21 Bridget Maldonado ([id number]) [id number] Page 3 of 5 -------------------------------------------------------------------------------- Fall Risk Assessment Details Patient Name: Date of Service: Bridget Maldonado, [name] 03/30/2023 2:00 PM Medical Record Number: [id number] Patient Account Number: [id number] Date of Birth/Sex: Treating RN: 09/13/1980 (42 y.o. [name] Primary Care Dhanvin Szeto: Tillman Abide Other Clinician: Referring  Alyrica Thurow: Treating Romualdo Prosise/Extender: Eliott Nine in Treatment: 0 Fall Risk Assessment Items Have you had 2 or more falls in the last 12 monthso 0 Yes Have you had any fall that resulted in injury in the last 12 monthso 0 Yes FALLS RISK SCREEN History of falling - immediate or within 3 months 0 No Secondary diagnosis (Do you have 2 or more medical diagnoseso) 0 No Ambulatory aid None/bed rest/wheelchair/nurse 0 No Crutches/cane/walker 0 No Furniture 0 No Intravenous therapy Access/Saline/Heparin Lock 0 No Gait/Transferring Normal/ bed rest/ wheelchair 0 No Weak (short steps with or without shuffle, stooped but able to lift head while walking, may seek 0 No support from furniture) Impaired (short steps with  shuffle, may have difficulty arising from chair, head down, impaired 0 No balance) Mental Status Oriented to own ability 0 Yes Electronic Signature(s) Signed: 03/31/2023 5:02:18 PM By: Midge Aver MSN RN CNS WTA Entered By: Midge Aver on 03/30/2023 14:18:50 -------------------------------------------------------------------------------- Foot Assessment Details Patient Name: Date of Service: Bridget Lex. 03/30/2023 2:00 PM Medical Record Number: [id number] Patient Account Number: [id number] Date of Birth/Sex: Treating RN: 01/02/1981 (42 y.o. [name] Primary Care Arayah Krouse: Tillman Abide Other Clinician: Referring Shar Paez: Treating Markita Stcharles/Extender: Eliott Nine in Treatment: 0 Foot Assessment Items Site Locations [place], [place] ([id number]) [id number] Page 4 of 5 + = Sensation present, - = Sensation absent, C = Callus, U = Ulcer R = Redness, W = Warmth, M = Maceration, PU = Pre-ulcerative lesion F = Fissure, S = Swelling, D = Dryness Assessment Right: Left: Other Deformity: No No Prior Foot Ulcer: No No Prior Amputation: No No Charcot Joint: No No Ambulatory Status:  Ambulatory Without Help Gait: Steady Electronic Signature(s) Signed: 03/31/2023 5:02:18 PM By: Midge Aver MSN RN CNS WTA Entered By: Midge Aver on 03/30/2023 14:19:41 -------------------------------------------------------------------------------- Nutrition Risk Screening Details Patient Name: Date of Service: Bridget Maldonado, [name] 03/30/2023 2:00 PM Medical Record Number: [id number] Patient Account Number: [id number] Date of Birth/Sex: Treating RN: 12-28-1980 (42 y.o. [name] Primary Care Haruko Mersch: Tillman Abide Other Clinician: Referring Elinora Weigand: Treating Keriann Rankin/Extender: Eliott Nine in Treatment: 0 Height (in): Weight (lbs): Body Mass Index (BMI): Nutrition Risk Screening Items Score Screening NUTRITION RISK SCREEN: I have an illness or condition that made me change the kind and/or amount of food I eat 0 No I eat fewer than two meals per day 0 No I eat few fruits and vegetables, or milk products 0 No I have three or more drinks of beer, liquor or wine almost every day 0 No I have tooth or mouth problems that make it hard for me to eat 0 No I don't always have enough money to buy the food I need 0 No Bridget Maldonado, Bridget Maldonado ([id number]) [id number] Page 5 of 5 I eat alone most of the time 0 No I take three or more different prescribed or over-the-counter drugs a day 1 Yes Without wanting to, I have lost or gained 10 pounds in the last six months 0 No I am not always physically able to shop, cook and/or feed myself 0 No Nutrition Protocols Good Risk Protocol 0 No interventions needed Moderate Risk Protocol High Risk Proctocol Risk Level: Good Risk Score: 1 Electronic Signature(s) Signed: 03/31/2023 5:02:18 PM By: Midge Aver MSN RN CNS WTA Entered By: Midge Aver on 03/30/2023 14:19:23

## 2023-03-31 NOTE — Progress Notes (Signed)
Bridget Maldonado, Bridget Maldonado ([id number]) [id number] Page 1 of 9 Visit Report for 03/30/2023 Allergy List Details Patient Name: Date of Service: Bridget Maldonado, Bridget Maldonado 03/30/2023 2:00 PM Medical Record Number: [id number] Patient Account Number: [id number] Date of Birth/Sex: Treating RN: Nov 02, 1980 (42 y.o. Bridget Maldonado Primary Care Bridget Maldonado: Bridget Maldonado Other Clinician: Referring Bridget Maldonado: Treating Bridget Maldonado/Extender: Bridget Maldonado in Treatment: 0 Allergies Active Allergies Sulfa (Sulfonamide Antibiotics) ciprofloxacin Effexor Allergy Notes Electronic Signature(s) Signed: 03/31/2023 5:02:18 PM By: Bridget Aver MSN RN CNS WTA Entered By: Bridget Maldonado on 03/30/2023 14:03:50 -------------------------------------------------------------------------------- Arrival Information Details Patient Name: Date of Service: Bridget Lex. 03/30/2023 2:00 PM Medical Record Number: [id number] Patient Account Number: [id number] Date of Birth/Sex: Treating RN: [birth year] (42 y.o. Bridget Maldonado Primary Care Bridget Maldonado: Bridget Maldonado Other Clinician: Referring Bridget Maldonado: Treating Ianmichael Maldonado/Extender: Bridget Maldonado in Treatment: 0 Visit Information Patient Arrived: Ambulatory Arrival Time: 14:01 Accompanied By: self Transfer Assistance: None Patient Identification Verified: Yes Secondary Verification Process Completed: Yes Patient Requires Transmission-Based Precautions: No Patient Has Alerts: Yes Patient Alerts: ASA 81 mg Bridget Maldonado ([id number]) [id number] Page 2 of 9 Electronic Signature(s) Signed: 03/30/2023 2:30:29 PM By: Bridget Aver MSN RN CNS WTA Entered By: Bridget Maldonado on 03/30/2023 14:30:29 -------------------------------------------------------------------------------- Clinic Level of Care Assessment Details Patient Name: Date of Service: Bridget Maldonado, Bridget Maldonado. 03/30/2023 2:00 PM Medical Record  Number: [id number] Patient Account Number: [id number] Date of Birth/Sex: Treating RN: May 31, 1981 (42 y.o. Bridget Maldonado Primary Care Bridget Maldonado: Bridget Maldonado Other Clinician: Referring Bridget Maldonado: Treating Bridget Maldonado/Extender: Bridget Maldonado in Treatment: 0 Clinic Level of Care Assessment Items TOOL 2 Quantity Score X- 1 0 Use when only an EandM is performed on the INITIAL visit ASSESSMENTS - Nursing Assessment / Reassessment X- 1 20 General Physical Exam (combine w/ comprehensive assessment (listed just below) when performed on new pt. evals) X- 1 25 Comprehensive Assessment (HX, ROS, Risk Assessments, Wounds Hx, etc.) ASSESSMENTS - Wound and Skin A ssessment / Reassessment X - Simple Wound Assessment / Reassessment - one wound 1 5 []  - 0 Complex Wound Assessment / Reassessment - multiple wounds []  - 0 Dermatologic / Skin Assessment (not related to wound area) ASSESSMENTS - Ostomy and/or Continence Assessment and Care []  - 0 Incontinence Assessment and Management []  - 0 Ostomy Care Assessment and Management (repouching, etc.) PROCESS - Coordination of Care X - Simple Patient / Family Education for ongoing care 1 15 []  - 0 Complex (extensive) Patient / Family Education for ongoing care X- 1 10 Staff obtains Chiropractor, Records, T Results / Process Orders est []  - 0 Staff telephones HHA, Nursing Homes / Clarify orders / etc []  - 0 Routine Transfer to another Facility (non-emergent condition) []  - 0 Routine Hospital Admission (non-emergent condition) X- 1 15 New Admissions / Manufacturing engineer / Ordering NPWT Apligraf, etc. , []  - 0 Emergency Hospital Admission (emergent condition) X- 1 10 Simple Discharge Coordination []  - 0 Complex (extensive) Discharge Coordination PROCESS - Special Needs []  - 0 Pediatric / Minor Patient Management []  - 0 Isolation Patient Management []  - 0 Hearing / Language / Visual special needs []  - 0 Assessment of  Community assistance (transportation, D/C planning, etc.) []  - 0 Additional assistance / Altered mentation Bridget Maldonado ([id number]) [id number] Page 3 of 9 []  - 0 Support Surface(s) Assessment (bed, cushion, seat, etc.) INTERVENTIONS - Wound Cleansing / Measurement X- 1 5 Wound Imaging (photographs - any number of wounds) []  - 0 Wound Tracing (instead of  photographs) X- 1 5 Simple Wound Measurement - one wound []  - 0 Complex Wound Measurement - multiple wounds X- 1 5 Simple Wound Cleansing - one wound []  - 0 Complex Wound Cleansing - multiple wounds INTERVENTIONS - Wound Dressings X - Small Wound Dressing one or multiple wounds 1 10 []  - 0 Medium Wound Dressing one or multiple wounds []  - 0 Large Wound Dressing one or multiple wounds []  - 0 Application of Medications - injection INTERVENTIONS - Miscellaneous []  - 0 External ear exam []  - 0 Specimen Collection (cultures, biopsies, blood, body fluids, etc.) []  - 0 Specimen(s) / Culture(s) sent or taken to Lab for analysis []  - 0 Patient Transfer (multiple staff / Nurse, adult / Similar devices) []  - 0 Simple Staple / Suture removal (25 or less) []  - 0 Complex Staple / Suture removal (26 or more) []  - 0 Hypo / Hyperglycemic Management (close monitor of Blood Glucose) []  - 0 Ankle / Brachial Index (ABI) - do not check if billed separately Has the patient been seen at the hospital within the last three years: Yes Total Score: 125 Level Of Care: New/Established - Level 4 Electronic Signature(s) Signed: 03/31/2023 5:02:18 PM By: Bridget Aver MSN RN CNS WTA Entered By: Bridget Maldonado on 03/30/2023 15:20:40 -------------------------------------------------------------------------------- Encounter Discharge Information Details Patient Name: Date of Service: Bridget Lex. 03/30/2023 2:00 PM Medical Record Number: [id number] Patient Account Number: [id number] Date of Birth/Sex: Treating  RN: 09-Sep-1980 (42 y.o. Bridget Maldonado Primary Care Bridget Maldonado: Bridget Maldonado Other Clinician: Referring Bridget Maldonado: Treating Bridget Maldonado/Extender: Bridget Maldonado in Treatment: 0 Encounter Discharge Information Items Discharge Condition: Stable Ambulatory Status: Ambulatory Discharge Destination: Home Transportation: Private Auto Accompanied By: mother Schedule Follow-up Appointment: Bridget Maldonado, Bridget Maldonado ([id number]) [id number] Page 4 of 9 Clinical Summary of Care: Electronic Signature(s) Signed: 03/30/2023 3:34:25 PM By: Bridget Aver MSN RN CNS WTA Entered By: Bridget Maldonado on 03/30/2023 15:34:25 -------------------------------------------------------------------------------- Lower Extremity Assessment Details Patient Name: Date of Service: Bridget Maldonado, Bridget Maldonado. 03/30/2023 2:00 PM Medical Record Number: [id number] Patient Account Number: [id number] Date of Birth/Sex: Treating RN: [birth year] (42 y.o. Bridget Maldonado Primary Care Rykker Coviello: Bridget Maldonado Other Clinician: Referring Dejuan Elman: Treating Carilyn Woolston/Extender: Bridget Maldonado in Treatment: 0 Electronic Signature(s) Signed: 03/31/2023 5:02:18 PM By: Bridget Aver MSN RN CNS WTA Entered By: Bridget Maldonado on 03/30/2023 14:56:26 -------------------------------------------------------------------------------- Multi Wound Chart Details Patient Name: Date of Service: Bridget Lex. 03/30/2023 2:00 PM Medical Record Number: [id number] Patient Account Number: [id number] Date of Birth/Sex: Treating RN: 08/02/1980 (42 y.o. Bridget Maldonado Primary Care Bennye Nix: Bridget Maldonado Other Clinician: Referring Barry Culverhouse: Treating Zollie Clemence/Extender: Bridget Maldonado in Treatment: 0 Vital Signs Height(in): 64 Pulse(bpm): 106 Weight(lbs): 227 Blood Pressure(mmHg): 127/89 Body Mass Index(BMI): 39 Temperature(F): 98.3 Respiratory Rate(breaths/min):  18 [1:Photos:] [N/A:N/A] Right Chin N/A N/A Wound Location: Trauma N/A N/A Wounding Event: Trauma, Other N/A N/A Primary Etiology: Asthma, Hypertension N/A N/A Comorbid History: 03/20/2023 N/A N/A Date Acquired: 0 N/A N/A Weeks of Treatment: Open N/A N/A Wound Status: No N/A N/A Wound Recurrence: 1x1.2x0.2 N/A N/A Measurements L x W x D (cm) 0.942 N/A N/A A (cm) : rea 0.188 N/A N/A Volume (cm) : Full Thickness Without Exposed N/A N/A Classification: Support Structures Medium N/A N/A Exudate Amount: Serosanguineous N/A N/A Exudate Type: red, brown N/A N/A Exudate Color: Medium (34-66%) N/A N/A Granulation Amount: Red, Pink N/A N/A Granulation Quality: None Present (0%) N/A N/A Necrotic Amount: Fat Layer (Subcutaneous Tissue): Yes N/A N/A Exposed  Structures: Fascia: No Tendon: No Muscle: No Joint: No Bone: No Small (1-33%) N/A N/A Epithelialization: Treatment Notes Electronic Signature(s) Signed: 03/31/2023 5:02:18 PM By: Bridget Aver MSN RN CNS WTA Entered By: Bridget Maldonado on 03/30/2023 14:56:49 -------------------------------------------------------------------------------- Multi-Disciplinary Care Plan Details Patient Name: Date of Service: Bridget Lex. 03/30/2023 2:00 PM Medical Record Number: [id number] Patient Account Number: [id number] Date of Birth/Sex: Treating RN: 1981-06-20 (42 y.o. Bridget Maldonado Primary Care Samyuktha Brau: Bridget Maldonado Other Clinician: Referring Ingri Diemer: Treating Amoura Ransier/Extender: Bridget Maldonado in Treatment: 0 Active Inactive Orientation to the Wound Care Program Nursing Diagnoses: Knowledge deficit related to the wound healing center program Goals: Patient/caregiver will verbalize understanding of the Wound Healing Center Program Date Initiated: 03/30/2023 Target Resolution Date: 04/06/2023 Goal Status: Active Interventions: Provide education on orientation to the wound  center Notes: Wound/Skin Impairment Nursing Diagnoses: Bridget Maldonado, Bridget Maldonado ([id number]) [id number] Page 6 of 9 Impaired tissue integrity Knowledge deficit related to ulceration/compromised skin integrity Goals: Patient/caregiver will verbalize understanding of skin care regimen Date Initiated: 03/30/2023 Target Resolution Date: 05/01/2023 Goal Status: Active Ulcer/skin breakdown will have a volume reduction of 30% by week 4 Date Initiated: 03/30/2023 Target Resolution Date: 04/30/2023 Goal Status: Active Ulcer/skin breakdown will have a volume reduction of 50% by week 8 Date Initiated: 03/30/2023 Target Resolution Date: 05/30/2023 Goal Status: Active Ulcer/skin breakdown will have a volume reduction of 80% by week 12 Date Initiated: 03/30/2023 Target Resolution Date: 06/30/2023 Goal Status: Active Interventions: Assess patient/caregiver ability to obtain necessary supplies Assess patient/caregiver ability to perform ulcer/skin care regimen upon admission and as needed Assess ulceration(s) every visit Provide education on ulcer and skin care Treatment Activities: Skin care regimen initiated : 03/30/2023 Notes: Electronic Signature(s) Signed: 03/30/2023 3:33:20 PM By: Bridget Aver MSN RN CNS WTA Previous Signature: 03/30/2023 3:33:14 PM Version By: Bridget Aver MSN RN CNS WTA Entered By: Bridget Maldonado on 03/30/2023 15:33:20 -------------------------------------------------------------------------------- Pain Assessment Details Patient Name: Date of Service: Bridget Lex. 03/30/2023 2:00 PM Medical Record Number: [id number] Patient Account Number: [id number] Date of Birth/Sex: Treating RN: March 13, 1981 (42 y.o. Bridget Maldonado Primary Care Kathrynne Kulinski: Bridget Maldonado Other Clinician: Referring Emalina Dubreuil: Treating Nakari Bracknell/Extender: Bridget Maldonado in Treatment: 0 Active Problems Location of Pain Severity and Description of Pain Patient Has  Paino No Site Locations Luther, Bridget Maldonado [place] ([id number]) [phone].pdf Page 7 of 9 Pain Management and Medication Current Pain Management: Electronic Signature(s) Signed: 03/31/2023 5:02:18 PM By: Bridget Aver MSN RN CNS WTA Entered By: Bridget Maldonado on 03/30/2023 14:20:52 -------------------------------------------------------------------------------- Patient/Caregiver Education Details Patient Name: Date of Service: Bridget Lex 10/7/2024andnbsp2:00 PM Medical Record Number: [id number] Patient Account Number: [id number] Date of Birth/Gender: Treating RN: 1981/02/17 (42 y.o. Bridget Maldonado Primary Care Physician: Bridget Maldonado Other Clinician: Referring Physician: Treating Physician/Extender: Bridget Maldonado in Treatment: 0 Education Assessment Education Provided To: Patient Education Topics Provided Welcome [facility] Packet: o Handouts: Welcome T [facility] o Methods: Explain/Verbal Responses: State content correctly Wound/Skin Impairment: Handouts: Caring for Your Ulcer Methods: Explain/Verbal Responses: State content correctly Electronic Signature(s) Signed: 03/31/2023 5:02:18 PM By: Bridget Aver MSN RN CNS WTA Entered By: Bridget Maldonado on 03/30/2023 15:33:46 Harkey, [name] ([id number]) [id number] Page 8 of 9 -------------------------------------------------------------------------------- Wound Assessment Details Patient Name: Date of Service: Bridget Maldonado, Bridget Maldonado 03/30/2023 2:00 PM Medical Record Number: [id number] Patient Account Number: [id number] Date of Birth/Sex: Treating RN: March 27, 1981 (42 y.o. Bridget Maldonado Primary Care Claxton Levitz: Bridget Maldonado Other Clinician: Referring Abeni Finchum:  Treating Bridget Maldonado/Extender: Natasha Bence Weeks in Treatment: 0 Wound Status Wound Number: 1 Primary Etiology: Trauma, Other Wound Location: Right  Chin Wound Status: Open Wounding Event: Trauma Comorbid History: Asthma, Hypertension Date Acquired: 03/20/2023 Weeks Of Treatment: 0 Clustered Wound: No Photos Wound Measurements Length: (cm) 1 Width: (cm) 1.2 Depth: (cm) 0.2 Area: (cm) 0.942 Volume: (cm) 0.188 % Reduction in Area: % Reduction in Volume: Epithelialization: Small (1-33%) Tunneling: No Undermining: No Wound Description Classification: Full Thickness Without Exposed Support Structures Exudate Amount: Medium Exudate Type: Serosanguineous Exudate Color: red, brown Foul Odor After Cleansing: No Slough/Fibrino Yes Wound Bed Granulation Amount: Medium (34-66%) Exposed Structure Granulation Quality: Red, Pink Fascia Exposed: No Necrotic Amount: None Present (0%) Fat Layer (Subcutaneous Tissue) Exposed: Yes Tendon Exposed: No Muscle Exposed: No Joint Exposed: No Bone Exposed: No Treatment Notes Wound #1 (Chin) Wound Laterality: Right Cleanser Soap and Water Discharge Instruction: Gently cleanse wound with antibacterial soap, rinse and pat dry prior to dressing wounds Peri-Wound Care Bridget Maldonado, Bridget Maldonado ([id number]) [id number] Page 9 of 9 Topical Primary Dressing Xeroform-HBD 2x2 (in/in) Discharge Instruction: Apply Xeroform-HBD 2x2 (in/in) as directed Secondary Dressing Coverlet Latex-Free Fabric Adhesive Dressings Discharge Instruction: Knuckle Secured With Compression Wrap Compression Stockings Add-Ons Electronic Signature(s) Signed: 03/31/2023 5:02:18 PM By: Bridget Aver MSN RN CNS WTA Entered By: Bridget Maldonado on 03/30/2023 14:56:13 -------------------------------------------------------------------------------- Vitals Details Patient Name: Date of Service: Bridget Lex. 03/30/2023 2:00 PM Medical Record Number: [id number] Patient Account Number: [id number] Date of Birth/Sex: Treating RN: [birth year] (42 y.o. Bridget Maldonado Primary Care Jaedah Lords: Bridget Maldonado Other  Clinician: Referring Chihiro Frey: Treating Yeimy Bridget Maldonado/Extender: Bridget Maldonado in Treatment: 0 Vital Signs Time Taken: 14:20 Temperature (F): 98.3 Height (in): 64 Pulse (bpm): 106 Source: Stated Respiratory Rate (breaths/min): 18 Weight (lbs): 227 Blood Pressure (mmHg): 127/89 Source: Stated Reference Range: 80 - 120 mg / dl Body Mass Index (BMI): 39 Electronic Signature(s) Signed: 03/31/2023 5:02:18 PM By: Bridget Aver MSN RN CNS WTA Entered By: Bridget Maldonado on 03/30/2023 14:21:34

## 2023-03-31 NOTE — Progress Notes (Signed)
Bridget, [name] ([id number]) [id number] Page 1 of 8 Visit Report for 03/30/2023 Chief Complaint Document Details Patient Name: Date of Service: [name], [name] 03/30/2023 2:00 PM Medical Record Number: [id number] Patient Account Number: [id number] Date of Birth/Sex: Treating RN: August 01, 1980 (42 y.o. Bridget Maldonado Primary Care Provider: Tillman Abide Other Clinician: Referring Provider: Treating Provider/Extender: Eliott Nine in Treatment: 0 Information Obtained from: Patient Chief Complaint Dog bite right chin Electronic Signature(s) Signed: 03/30/2023 2:54:06 PM By: Allen Derry PA-C Entered By: Allen Derry on 03/30/2023 14:54:05 -------------------------------------------------------------------------------- HPI Details Patient Name: Date of Service: Bridget Maldonado. 03/30/2023 2:00 PM Medical Record Number: [id number] Patient Account Number: [id number] Date of Birth/Sex: Treating RN: 12-30-1980 (42 y.o. Bridget Maldonado Primary Care Provider: Tillman Abide Other Clinician: Referring Provider: Treating Provider/Extender: Eliott Nine in Treatment: 0 History of Present Illness HPI Description: 03-30-2023 upon evaluation today patient appears for initial inspection here in clinic for a history of having had a dog bite which occurred on March 20, 2023. She tells me that this with a straight pick wound that was at her job and she was on break. The pitbull lunged in her face and bit her. Fortunately I do not see any signs of active infection at this time which is great news. No fevers, chills, nausea, vomiting, or diarrhea. Patient does have a history of hypertension but otherwise no other major medical problems at this point which is great news. Electronic Signature(s) Signed: 03/30/2023 3:14:09 PM By: Allen Derry PA-C Entered By: Allen Derry on 03/30/2023 15:14:09 Knape, Dewitt Maldonado ([id number])  [id number] Page 2 of 8 -------------------------------------------------------------------------------- Physical Exam Details Patient Name: Date of Service: Bridget Maldonado, Bridget Maldonado 03/30/2023 2:00 PM Medical Record Number: [id number] Patient Account Number: [id number] Date of Birth/Sex: Treating RN: Mar 29, 1981 (42 y.o. Bridget Maldonado Primary Care Provider: Tillman Abide Other Clinician: Referring Provider: Treating Provider/Extender: Eliott Nine in Treatment: 0 Constitutional sitting or standing blood pressure is within target range for patient.. pulse regular and within target range for patient.Marland Kitchen respirations regular, non-labored and within target range for patient.Marland Kitchen temperature within target range for patient.. Well-nourished and well-hydrated in no acute distress. Eyes conjunctiva clear no eyelid edema noted. pupils equal round and reactive to light and accommodation. Ears, Nose, Mouth, and Throat no gross abnormality of ear auricles or external auditory canals. normal hearing noted during conversation. mucus membranes moist. Respiratory normal breathing without difficulty. Musculoskeletal normal gait and posture. no significant deformity or arthritic changes, no loss or range of motion, no clubbing. Psychiatric this patient is able to make decisions and demonstrates good insight into disease process. Alert and Oriented x 3. pleasant and cooperative. Notes Upon inspection patient's wound bed actually showed signs of improvement. She has actually been through almost the complete series of rabies vaccinations at this point. Fortunately I do not see any signs of worsening overall and I believe that the patient is making good headway towards complete closure. No fevers, chills, nausea, vomiting, or diarrhea. I do believe the wound is somewhat drying the 1 to try to keep it from being such so that it would heal much more rapidly and  appropriately. Electronic Signature(s) Signed: 03/30/2023 3:14:58 PM By: Allen Derry PA-C Entered By: Allen Derry on 03/30/2023 15:14:58 -------------------------------------------------------------------------------- Physician Orders Details Patient Name: Date of Service: Bridget Maldonado. 03/30/2023 2:00 PM Medical Record Number: [id number] Patient Account Number: [id number] Date of Birth/Sex: Treating RN: 01/08/1981 (42 y.o. Bridget Maldonado Primary Care Provider: Alphonsus Sias,  Richard Other Clinician: Referring Provider: Treating Provider/Extender: Eliott Nine in Treatment: 0 Verbal / Phone Orders: No Diagnosis [address] SHARNEE, Maldonado ([id number]) [id number] Page 3 of 8 ICD-10 Coding Code Description W54.0XXA Bitten by dog, initial encounter L98.492 Non-pressure chronic ulcer of skin of other sites with fat layer exposed I10 Essential (primary) hypertension Follow-up Appointments Return Appointment in 1 week. Bathing/ [employer] wounds with antibacterial soap and water. Wound Treatment Wound #1 - Chin Wound Laterality: Right Cleanser: Soap and Water 1 x Per Day/30 Days Discharge Instructions: Gently cleanse wound with antibacterial soap, rinse and pat dry prior to dressing wounds Prim Dressing: Xeroform-HBD 2x2 (in/in) 1 x Per Day/30 Days ary Discharge Instructions: Apply Xeroform-HBD 2x2 (in/in) as directed Secondary Dressing: Coverlet Latex-Free Fabric Adhesive Dressings 1 x Per Day/30 Days Discharge Instructions: Knuckle Electronic Signature(s) Signed: 03/30/2023 3:41:41 PM By: Allen Derry PA-C Signed: 03/31/2023 5:02:18 PM By: Midge Aver MSN RN CNS WTA Entered By: Midge Aver on 03/30/2023 15:15:19 -------------------------------------------------------------------------------- Problem List Details Patient Name: Date of Service: Bridget Maldonado. 03/30/2023 2:00 PM Medical Record Number: [id number] Patient  Account Number: [id number] Date of Birth/Sex: Treating RN: 1980-07-07 (42 y.o. Bridget Maldonado Primary Care Provider: Tillman Abide Other Clinician: Referring Provider: Treating Provider/Extender: Eliott Nine in Treatment: 0 Active Problems ICD-10 Encounter Code Description Active Date MDM Diagnosis W54.0XXA Bitten by dog, initial encounter 03/30/2023 No Yes L98.492 Non-pressure chronic ulcer of skin of other sites with fat layer exposed 03/30/2023 No Yes I10 Essential (primary) hypertension 03/30/2023 No Yes Inactive Problems Resolved Problems CEAIRA, ERNSTER ([id number]) [id number] Page 4 of 8 Electronic Signature(s) Signed: 03/30/2023 2:53:42 PM By: Allen Derry PA-C Entered By: Allen Derry on 03/30/2023 14:53:42 -------------------------------------------------------------------------------- Progress Note Details Patient Name: Date of Service: Bridget Maldonado, Bridget Maldonado. 03/30/2023 2:00 PM Medical Record Number: [id number] Patient Account Number: [id number] Date of Birth/Sex: Treating RN: Oct 31, 1980 (42 y.o. Bridget Maldonado Primary Care Provider: Tillman Abide Other Clinician: Referring Provider: Treating Provider/Extender: Eliott Nine in Treatment: 0 Subjective Chief Complaint Information obtained from Patient Dog bite right chin History of Present Illness (HPI) 03-30-2023 upon evaluation today patient appears for initial inspection here in clinic for a history of having had a dog bite which occurred on March 20, 2023. She tells me that this with a straight pick wound that was at her job and she was on break. The pitbull lunged in her face and bit her. Fortunately I do not see any signs of active infection at this time which is great news. No fevers, chills, nausea, vomiting, or diarrhea. Patient does have a history of hypertension but otherwise no other major medical problems at this point which is great  news. Patient History Information obtained from Patient. Allergies Sulfa (Sulfonamide Antibiotics), ciprofloxacin, Effexor Social History Never smoker, Marital Status - Married, Alcohol Use - Never, Drug Use - No History, Caffeine Use - Daily. Medical History Respiratory Patient has history of Asthma Cardiovascular Patient has history of Hypertension Medical A Surgical History Notes nd Neurologic Hx of CVA 12/04/2022 Psychiatric Bipolar Review of Systems (ROS) Constitutional Symptoms (General Health) Denies complaints or symptoms of Fatigue, Fever, Chills, Marked Weight Change. Eyes Denies complaints or symptoms of Dry Eyes, Vision Changes, Glasses / Contacts. Ear/Nose/Mouth/Throat Denies complaints or symptoms of Difficult clearing ears, Sinusitis. Hematologic/Lymphatic Denies complaints or symptoms of Bleeding / Clotting Disorders, Human Immunodeficiency Virus. Cardiovascular Denies complaints or symptoms of Chest pain, LE edema. Gastrointestinal Denies complaints or symptoms of Frequent  diarrhea, Nausea, Vomiting. Genitourinary Denies complaints or symptoms of Kidney failure/ Dialysis, Incontinence/dribbling. Immunological Denies complaints or symptoms of Hives, Itching. Integumentary (Skin) Denies complaints or symptoms of Wounds, Bleeding or bruising tendency, Breakdown, Swelling. Bridget Maldonado, Bridget Maldonado ([id number]) [id number] Page 5 of 8 Musculoskeletal Denies complaints or symptoms of Muscle Pain, Muscle Weakness. Psychiatric Complains or has symptoms of Anxiety. Objective Constitutional sitting or standing blood pressure is within target range for patient.. pulse regular and within target range for patient.Marland Kitchen respirations regular, non-labored and within target range for patient.Marland Kitchen temperature within target range for patient.. Well-nourished and well-hydrated in no acute distress. Vitals Time Taken: 2:20 PM, Height: 64 in, Source: Stated,  Weight: 227 lbs, Source: Stated, BMI: 39, Temperature: 98.3 F, Pulse: 106 bpm, Respiratory Rate: 18 breaths/min, Blood Pressure: 127/89 mmHg. Eyes conjunctiva clear no eyelid edema noted. pupils equal round and reactive to light and accommodation. Ears, Nose, Mouth, and Throat no gross abnormality of ear auricles or external auditory canals. normal hearing noted during conversation. mucus membranes moist. Respiratory normal breathing without difficulty. Musculoskeletal normal gait and posture. no significant deformity or arthritic changes, no loss or range of motion, no clubbing. Psychiatric this patient is able to make decisions and demonstrates good insight into disease process. Alert and Oriented x 3. pleasant and cooperative. General Notes: Upon inspection patient's wound bed actually showed signs of improvement. She has actually been through almost the complete series of rabies vaccinations at this point. Fortunately I do not see any signs of worsening overall and I believe that the patient is making good headway towards complete closure. No fevers, chills, nausea, vomiting, or diarrhea. I do believe the wound is somewhat drying the 1 to try to keep it from being such so that it would heal much more rapidly and appropriately. Integumentary (Hair, Skin) Wound #1 status is Open. Original cause of wound was Trauma. The date acquired was: 03/20/2023. The wound is located on the Right Chin. The wound measures 1cm length x 1.2cm width x 0.2cm depth; 0.942cm^2 area and 0.188cm^3 volume. There is Fat Layer (Subcutaneous Tissue) exposed. There is no tunneling or undermining noted. There is a medium amount of serosanguineous drainage noted. There is medium (34-66%) red, pink granulation within the wound bed. There is no necrotic tissue within the wound bed. Assessment Active Problems ICD-10 Bitten by dog, initial encounter Non-pressure chronic ulcer of skin of other sites with fat layer  exposed Essential (primary) hypertension Plan 1. Based on what I am seeing I do believe that the patient is making good headway here towards complete closure and I am very pleased in that regard. Fortunately I do not see any signs of worsening overall. 2. I am going to recommend as well that the patient should continue to monitor for any signs of infection. If anything changes she should let me know. 3. We can initiate treatment with a Xeroform gauze dressing which I think is going to the best way to go and she is in agreement with that plan. 4. I am also going to recommend that the patient should continue to utilize the Band-Aid and I specifically think that the next care do a bandage would probably be a good option for her. She voiced understanding. We will see patient back for reevaluation in 1 week here in the clinic. If anything worsens or changes patient will contact our office for additional recommendations. Electronic Signature(s) Signed: 03/30/2023 3:15:39 PM By: Allen Derry PA-C Entered By: Allen Derry on 03/30/2023 15:15:39 Gaspari, Sarra M (  [id number]) [id number] Page 6 of 8 -------------------------------------------------------------------------------- ROS/PFSH Details Patient Name: Date of Service: Bridget Maldonado, Bridget Maldonado 03/30/2023 2:00 PM Medical Record Number: [id number] Patient Account Number: [id number] Date of Birth/Sex: Treating RN: 1980/12/16 (42 y.o. Bridget Maldonado Primary Care Provider: Tillman Abide Other Clinician: Referring Provider: Treating Provider/Extender: Eliott Nine in Treatment: 0 Information Obtained From Patient Constitutional Symptoms (General Health) Complaints and Symptoms: Negative for: Fatigue; Fever; Chills; Marked Weight Change Eyes Complaints and Symptoms: Negative for: Dry Eyes; Vision Changes; Glasses / Contacts Ear/Nose/Mouth/Throat Complaints and Symptoms: Negative for: Difficult  clearing ears; Sinusitis Hematologic/Lymphatic Complaints and Symptoms: Negative for: Bleeding / Clotting Disorders; Human Immunodeficiency Virus Cardiovascular Complaints and Symptoms: Negative for: Chest pain; LE edema Medical History: Positive for: Hypertension Gastrointestinal Complaints and Symptoms: Negative for: Frequent diarrhea; Nausea; Vomiting Genitourinary Complaints and Symptoms: Negative for: Kidney failure/ Dialysis; Incontinence/dribbling Immunological Complaints and Symptoms: Negative for: Hives; Itching Integumentary (Skin) Complaints and Symptoms: Negative for: Wounds; Bleeding or bruising tendency; Breakdown; Swelling Musculoskeletal Complaints and Symptoms: Negative for: Muscle Pain; Muscle Weakness Bridget Maldonado, Bridget Maldonado ([id number]) [id number] Page 7 of 8 Psychiatric Complaints and Symptoms: Positive for: Anxiety Medical History: Past Medical History Notes: Bipolar Respiratory Medical History: Positive for: Asthma Endocrine Neurologic Medical History: Past Medical History Notes: Hx of CVA 12/04/2022 Oncologic Immunizations Pneumococcal Vaccine: Received Pneumococcal Vaccination: No Tetanus Vaccine: Last tetanus shot: 03/20/2023 Implantable Devices None Family and Social History Never smoker; Marital Status - Married; Alcohol Use: Never; Drug Use: No History; Caffeine Use: Daily Electronic Signature(s) Signed: 03/30/2023 3:41:41 PM By: Allen Derry PA-C Signed: 03/31/2023 5:02:18 PM By: Midge Aver MSN RN CNS WTA Entered By: Midge Aver on 03/30/2023 14:16:34 -------------------------------------------------------------------------------- SuperBill Details Patient Name: Date of Service: LEMYA, Bridget Maldonado 03/30/2023 Medical Record Number: [id number] Patient Account Number: [id number] Date of Birth/Sex: Treating RN: 1980/09/03 (42 y.o. Bridget Maldonado Primary Care Provider: Tillman Abide Other Clinician: Referring  Provider: Treating Provider/Extender: Eliott Nine in Treatment: 0 Diagnosis Coding ICD-10 Codes Code Description [id number] Bitten by dog, initial encounter L98.492 Non-pressure chronic ulcer of skin of other sites with fat layer exposed I10 Essential (primary) hypertension Facility Procedures DINEEN, CONRADT ([id number]): CPT4 Code Description [id number] [phone] - WOUND CARE VISIT-LEV 4 EST PT [id number] Page 8 of 8: Modifier Quantity 1 Physician Procedures : CPT4 Code Description Modifier [id number] WC PHYS LEVEL 3 NEW PT ICD-10 Diagnosis Description W54.0XXA Bitten by dog, initial encounter [phone] Non-pressure chronic ulcer of skin of other sites with fat layer exposed I10 Essential (primary) hypertension Quantity: 1 Electronic Signature(s) Signed: 03/30/2023 3:41:41 PM By: Allen Derry PA-C Signed: 03/31/2023 5:02:18 PM By: Midge Aver MSN RN CNS WTA Previous Signature: 03/30/2023 3:15:54 PM Version By: Allen Derry PA-C Entered By: Midge Aver on 03/30/2023 15:21:20

## 2023-04-02 ENCOUNTER — Other Ambulatory Visit: Payer: Self-pay | Admitting: Internal Medicine

## 2023-04-02 MED ORDER — TRAMADOL HCL 50 MG PO TABS: 50.0000 mg | ORAL_TABLET | Freq: Two times a day (BID) | ORAL | 0 refills | Status: DC | PRN

## 2023-04-02 NOTE — Telephone Encounter (Signed)
Last filled 03-03-23 #30 Last OV 02-11-23 Next OV 09-14-23 [facility]

## 2023-04-03 ENCOUNTER — Ambulatory Visit
Admission: EM | Admit: 2023-04-03 | Discharge: 2023-04-03 | Disposition: A | Discharge status: Home / Self Care | Payer: No Typology Code available for payment source | Attending: Internal Medicine | Admitting: Internal Medicine

## 2023-04-03 DIAGNOSIS — Z23 Encounter for immunization: Secondary | ICD-10-CM

## 2023-04-03 MED ORDER — RABIES VACCINE, PCEC IM SUSR
1.0000 mL | Freq: Once | INTRAMUSCULAR | Status: AC
  Administered 2023-04-03: 1 mL via INTRAMUSCULAR

## 2023-04-03 NOTE — ED Triage Notes (Signed)
Patient presents to UC for day 14 rabies vaccine. Tolerated well.

## 2023-04-06 ENCOUNTER — Encounter: Payer: No Typology Code available for payment source | Admitting: Physician Assistant

## 2023-04-06 DIAGNOSIS — L98492 Non-pressure chronic ulcer of skin of other sites with fat layer exposed: Secondary | ICD-10-CM | POA: Diagnosis not present

## 2023-04-06 NOTE — Progress Notes (Signed)
Bridget Maldonado, Bridget Maldonado ([id number]) [id number] Page 1 of 6 Visit Report for 04/06/2023 Chief Complaint Document Details Patient Name: Date of Service: Bridget Maldonado 04/06/2023 7:45 A M Medical Record Number: [id number] Patient Account Number: [id number] Date of Birth/Sex: Treating RN: [birth year] (42 y.o. [name] Primary Care Provider: Tillman Abide Other Clinician: Referring Provider: Treating Provider/Extender: Derrek Monaco in Treatment: 1 Information Obtained from: Patient Chief Complaint Dog bite right chin Electronic Signature(s) Signed: 04/06/2023 8:27:35 AM By: Allen Derry PA-C Entered By: Allen Derry on 04/06/2023 05:27:35 -------------------------------------------------------------------------------- HPI Details Patient Name: Date of Service: Bridget Lex. 04/06/2023 7:45 A M Medical Record Number: [id number] Patient Account Number: [id number] Date of Birth/Sex: Treating RN: [birth year] (42 y.o. [name] Primary Care Provider: Tillman Abide Other Clinician: Referring Provider: Treating Provider/Extender: Derrek Monaco in Treatment: 1 History of Present Illness HPI Description: 03-30-2023 upon evaluation today patient appears for initial inspection here in clinic for a history of having had a dog bite which occurred on March 20, 2023. She tells me that this with a straight pick wound that was at her job and she was on break. The pitbull lunged in her face and bit her. Fortunately I do not see any signs of active infection at this time which is great news. No fevers, chills, nausea, vomiting, or diarrhea. Patient does have a history of hypertension but otherwise no other major medical problems at this point which is great news. 04-06-2023 upon evaluation today patient appears to be doing excellent in fact she is showing signs of great improvement in general I do believe that  the wound is moving along quite nicely. There is just a little bit of hypergranulation and I think that we can definitely treat that today to keep this moving along in the appropriate direction. Electronic Signature(s) Signed: 04/06/2023 8:40:09 AM By: Allen Derry PA-C Entered By: Allen Derry on 04/06/2023 05:40:08 Bridget Lex ([id number]) [id number] Page 2 of 6 -------------------------------------------------------------------------------- Bridget Maldonado Details Patient Name: Date of Service: HALAH, WHITESIDE 04/06/2023 7:45 A M Medical Record Number: [id number] Patient Account Number: [id number] Date of Birth/Sex: Treating RN: May 13, 1981 (42 y.o. [name] Primary Care Provider: Tillman Abide Other Clinician: Referring Provider: Treating Provider/Extender: Derrek Monaco in Treatment: 1 Procedure Performed for: Wound #1 Right Chin Performed By: Physician Allen Derry, PA-C Post Procedure Diagnosis Same as Pre-procedure Electronic Signature(s) Signed: 04/06/2023 4:53:08 PM By: Midge Aver MSN RN CNS WTA Entered By: Midge Aver on 04/06/2023 05:15:25 -------------------------------------------------------------------------------- Physical Exam Details Patient Name: Date of Service: Bridget Lex. 04/06/2023 7:45 A M Medical Record Number: [id number] Patient Account Number: [id number] Date of Birth/Sex: Treating RN: [birth year] (42 y.o. [name] Primary Care Provider: Tillman Abide Other Clinician: Referring Provider: Treating Provider/Extender: Derrek Monaco in Treatment: 1 Constitutional Well-nourished and well-hydrated in no acute distress. Respiratory normal breathing without difficulty. Psychiatric this patient is able to make decisions and demonstrates good insight into disease process. Alert and Oriented x 3. pleasant and cooperative. Notes Upon inspection  patient's wound bed actually showed signs of good granulation and epithelization at this point. Fortunately I do not see any signs of worsening I do believe that the patient is making good headway towards complete closure. I did perform a slight chemical cauterization with silver nitrate due to the fact that she did have some hypergranulation area noted here and the patient tolerated that without complication. Electronic Signature(s) Signed: 04/06/2023 8:40:30  AM By: Allen Derry PA-C Entered By: Allen Derry on 04/06/2023 05:40:30 Nickle, [name] ([id number]) [id number] Page 3 of 6 -------------------------------------------------------------------------------- Physician Orders Details Patient Name: Date of Service: Bridget Maldonado 04/06/2023 7:45 A M Medical Record Number: [id number] Patient Account Number: [id number] Date of Birth/Sex: Treating RN: 24-Mar-1981 (42 y.o. [name] Primary Care Provider: Tillman Abide Other Clinician: Referring Provider: Treating Provider/Extender: Derrek Monaco in Treatment: 1 Verbal / Phone Orders: No Diagnosis Coding Follow-up Appointments Return Appointment in 1 week. Bathing/ [employer] wounds with antibacterial soap and water. Wound Treatment Wound #1 - Chin Wound Laterality: Right Cleanser: Soap and Water 1 x Per Day/30 Days Discharge Instructions: Gently cleanse wound with antibacterial soap, rinse and pat dry prior to dressing wounds Prim Dressing: Xeroform-HBD 2x2 (in/in) 1 x Per Day/30 Days ary Discharge Instructions: Apply Xeroform-HBD 2x2 (in/in) as directed Secondary Dressing: Coverlet Latex-Free Fabric Adhesive Dressings 1 x Per Day/30 Days Discharge Instructions: Knuckle Electronic Signature(s) Signed: 04/06/2023 4:48:00 PM By: Allen Derry PA-C Signed: 04/06/2023 4:53:08 PM By: Midge Aver MSN RN CNS WTA Entered By: Midge Aver on 04/06/2023  05:15:39 -------------------------------------------------------------------------------- Problem List Details Patient Name: Date of Service: Bridget Lex. 04/06/2023 7:45 A M Medical Record Number: [id number] Patient Account Number: [id number] Date of Birth/Sex: Treating RN: [birth year] (42 y.o. [name] Primary Care Provider: Tillman Abide Other Clinician: Referring Provider: Treating Provider/Extender: Derrek Monaco in Treatment: [address] [place], [name] ([id number]) [id number] Page 4 of 6 ICD-10 Encounter Code Description Active Date MDM Diagnosis W54.0XXA Bitten by dog, initial encounter 03/30/2023 No Yes L98.492 Non-pressure chronic ulcer of skin of other sites with fat layer exposed 03/30/2023 No Yes I10 Essential (primary) hypertension 03/30/2023 No Yes Inactive Problems Resolved Problems Electronic Signature(s) Signed: 04/06/2023 8:27:19 AM By: Allen Derry PA-C Entered By: Allen Derry on 04/06/2023 05:27:19 -------------------------------------------------------------------------------- Progress Note Details Patient Name: Date of Service: Bridget Lex. 04/06/2023 7:45 A M Medical Record Number: [id number] Patient Account Number: [id number] Date of Birth/Sex: Treating RN: 15-Aug-1980 (42 y.o. [name] Primary Care Provider: Tillman Abide Other Clinician: Referring Provider: Treating Provider/Extender: Derrek Monaco in Treatment: 1 Subjective Chief Complaint Information obtained from Patient Dog bite right chin History of Present Illness (HPI) 03-30-2023 upon evaluation today patient appears for initial inspection here in clinic for a history of having had a dog bite which occurred on March 20, 2023. She tells me that this with a straight pick wound that was at her job and she was on break. The pitbull lunged in her face and bit her. Fortunately I do not see any  signs of active infection at this time which is great news. No fevers, chills, nausea, vomiting, or diarrhea. Patient does have a history of hypertension but otherwise no other major medical problems at this point which is great news. 04-06-2023 upon evaluation today patient appears to be doing excellent in fact she is showing signs of great improvement in general I do believe that the wound is moving along quite nicely. There is just a little bit of hypergranulation and I think that we can definitely treat that today to keep this moving along in the appropriate direction. Objective Constitutional Well-nourished and well-hydrated in no acute distress. MAYSON, STERBENZ ([id number]) [id number] Page 5 of 6 Vitals Time Taken: 7:54 AM, Height: 64 in, Weight: 227 lbs, BMI: 39, Temperature: 97.5 F, Pulse: 103 bpm, Respiratory Rate: 18 breaths/min, Blood Pressure: 134/86 mmHg. Respiratory normal  breathing without difficulty. Psychiatric this patient is able to make decisions and demonstrates good insight into disease process. Alert and Oriented x 3. pleasant and cooperative. General Notes: Upon inspection patient's wound bed actually showed signs of good granulation and epithelization at this point. Fortunately I do not see any signs of worsening I do believe that the patient is making good headway towards complete closure. I did perform a slight chemical cauterization with silver nitrate due to the fact that she did have some hypergranulation area noted here and the patient tolerated that without complication. Integumentary (Hair, Skin) Wound #1 status is Open. Original cause of wound was Trauma. The date acquired was: 03/20/2023. The wound has been in treatment 1 weeks. The wound is located on the Right Chin. The wound measures 0.7cm length x 0.7cm width x 0.1cm depth; 0.385cm^2 area and 0.038cm^3 volume. There is Fat Layer (Subcutaneous Tissue) exposed. There is a medium  amount of serosanguineous drainage noted. There is medium (34-66%) red, pink granulation within the wound bed. There is no necrotic tissue within the wound bed. Assessment Active Problems ICD-10 Bitten by dog, initial encounter Non-pressure chronic ulcer of skin of other sites with fat layer exposed Essential (primary) hypertension Procedures Wound #1 Pre-procedure diagnosis of Wound #1 is a Trauma, Other located on the Right Chin . An CHEM CAUT GRANULATION Maldonado procedure was performed by Allen Derry, PA-C. Post procedure Diagnosis Wound #1: Same as Pre-Procedure Plan Follow-up Appointments: Return Appointment in 1 week. Bathing/ Shower/ Hygiene: Wash wounds with antibacterial soap and water. WOUND #1: - Chin Wound Laterality: Right Cleanser: Soap and Water 1 x Per Day/30 Days Discharge Instructions: Gently cleanse wound with antibacterial soap, rinse and pat dry prior to dressing wounds Prim Dressing: Xeroform-HBD 2x2 (in/in) 1 x Per Day/30 Days ary Discharge Instructions: Apply Xeroform-HBD 2x2 (in/in) as directed Secondary Dressing: Coverlet Latex-Free Fabric Adhesive Dressings 1 x Per Day/30 Days Discharge Instructions: Knuckle 1. Based on what I am seeing I do believe that she is making excellent progress towards complete closure. Resume and I recommended we continue with the Xeroform gauze dressing which I think is still doing a really good job.. 3. I am going to recommend she continue with the coverlet to keep everything covered and protected and this seems to be doing awesome. We will see patient back for reevaluation in 1 week here in the clinic. If anything worsens or changes patient will contact our office for additional recommendations. Electronic Signature(s) Signed: 04/06/2023 8:41:17 AM By: Allen Derry PA-C Entered By: Allen Derry on 04/06/2023 05:41:17 Mullan, [name] ([id number]) [id number] Page 6 of  6 -------------------------------------------------------------------------------- SuperBill Details Patient Name: Date of Service: BANI, GIANFRANCESCO 04/06/2023 Medical Record Number: [id number] Patient Account Number: [id number] Date of Birth/Sex: Treating RN: 08-25-1980 (42 y.o. [name] Primary Care Provider: Tillman Abide Other Clinician: Referring Provider: Treating Provider/Extender: Derrek Monaco in Treatment: 1 Diagnosis Coding ICD-10 Codes Code Description [phone] Bitten by dog, initial encounter L98.492 Non-pressure chronic ulcer of skin of other sites with fat layer exposed I10 Essential (primary) hypertension Facility Procedures : CPT4 Code: [id number] Description: 17250 - CHEM CAUT GRANULATION Maldonado ICD-10 Diagnosis Description L98.492 Non-pressure chronic ulcer of skin of other sites with fat layer exposed Modifier: Quantity: 1 Physician Procedures : CPT4 Code Description Modifier [id number] 17250 - WC PHYS CHEM CAUT GRAN TISSUE ICD-10 Diagnosis Description L98.492 Non-pressure chronic ulcer of skin of other sites with fat layer exposed Quantity: 1 Electronic Signature(s) Signed: 04/06/2023 8:41:39 AM By:  Allen Derry PA-C Entered By: Allen Derry on 04/06/2023 05:41:38

## 2023-04-06 NOTE — Progress Notes (Signed)
Bridget Maldonado, Bridget Maldonado ([id number]) [id number] Page 1 of 8 Visit Report for 04/06/2023 Arrival Information Details Patient Name: Date of Service: Bridget Maldonado 04/06/2023 7:45 A M Medical Record Number: [id number] Patient Account Number: [id number] Date of Birth/Sex: Treating RN: Oct 05, 1980 (42 y.o. [name] Primary Care Kekai Geter: Tillman Abide Other Clinician: Referring Mieko Kneebone: Treating Desarai Barrack/Extender: Derrek Monaco in Treatment: 1 Visit Information History Since Last Visit Added or deleted any medications: No Patient Arrived: Ambulatory Has Dressing in Place as Prescribed: Yes Arrival Time: 07:41 Pain Present Now: No Accompanied By: self Transfer Assistance: None Patient Identification Verified: Yes Secondary Verification Process Completed: Yes Patient Requires Transmission-Based Precautions: No Patient Has Alerts: Yes Patient Alerts: ASA 81 mg Electronic Signature(s) Signed: 04/06/2023 4:53:08 PM By: Midge Aver MSN RN CNS WTA Entered By: Midge Aver on 04/06/2023 07:54:45 -------------------------------------------------------------------------------- Clinic Level of Care Assessment Details Patient Name: Date of Service: Bridget Maldonado, Bridget Maldonado 04/06/2023 7:45 A M Medical Record Number: [id number] Patient Account Number: [id number] Date of Birth/Sex: Treating RN: 1980-11-25 (42 y.o. [name] Primary Care Hakop Humbarger: Tillman Abide Other Clinician: Referring Dionne Knoop: Treating Zachary Lovins/Extender: Derrek Monaco in Treatment: 1 Clinic Level of Care Assessment Items TOOL 1 Quantity Score []  - 0 Use when EandM and Procedure is performed on INITIAL visit ASSESSMENTS - Nursing Assessment / Reassessment []  - 0 General Physical Exam (combine w/ comprehensive assessment (listed just below) when performed on new pt. evals) []  - 0 Comprehensive Assessment (HX, ROS, Risk Assessments, Wounds Hx,  etc.) ASSESSMENTS - Wound and Skin Assessment / Reassessment []  - 0 Dermatologic / Skin Assessment (not related to wound area) ASSESSMENTS - Ostomy and/or Continence Assessment and Care Bridget Maldonado ([id number]) [id number] Page 2 of 8 []  - 0 Incontinence Assessment and Management []  - 0 Ostomy Care Assessment and Management (repouching, etc.) PROCESS - Coordination of Care []  - 0 Simple Patient / Family Education for ongoing care []  - 0 Complex (extensive) Patient / Family Education for ongoing care []  - 0 Staff obtains Chiropractor, Records, T Results / Process Orders est []  - 0 Staff telephones HHA, Nursing Homes / Clarify orders / etc []  - 0 Routine Transfer to another Facility (non-emergent condition) []  - 0 Routine Hospital Admission (non-emergent condition) []  - 0 New Admissions / Manufacturing engineer / Ordering NPWT Apligraf, etc. , []  - 0 Emergency Hospital Admission (emergent condition) PROCESS - Special Needs []  - 0 Pediatric / Minor Patient Management []  - 0 Isolation Patient Management []  - 0 Hearing / Language / Visual special needs []  - 0 Assessment of Community assistance (transportation, D/C planning, etc.) []  - 0 Additional assistance / Altered mentation []  - 0 Support Surface(s) Assessment (bed, cushion, seat, etc.) INTERVENTIONS - Miscellaneous []  - 0 External ear exam []  - 0 Patient Transfer (multiple staff / Nurse, adult / Similar devices) []  - 0 Simple Staple / Suture removal (25 or less) []  - 0 Complex Staple / Suture removal (26 or more) []  - 0 Hypo/Hyperglycemic Management (do not check if billed separately) []  - 0 Ankle / Brachial Index (ABI) - do not check if billed separately Has the patient been seen at the hospital within the last three years: Yes Total Score: 0 Level Of Care: ____ Electronic Signature(s) Signed: 04/06/2023 4:53:08 PM By: Midge Aver MSN RN CNS WTA Entered By: Midge Aver on  04/06/2023 08:15:44 -------------------------------------------------------------------------------- Encounter Discharge Information Details Patient Name: Date of Service: Bridget Lex. 04/06/2023 7:45 A M Medical Record Number: [id number]  Patient Account Number: [id number] Date of Birth/Sex: Treating RN: 12/30/1980 (42 y.o. [name] Primary Care Koralynn Greenspan: Tillman Abide Other Clinician: Referring Bridget Burcher: Treating Kyndal Gloster/Extender: Derrek Monaco in Treatment: 1 Encounter Discharge Information Items Discharge Condition: Bridget Maldonado, Bridget Maldonado ([id number]) [id number] Page 3 of 8 Ambulatory Status: Ambulatory Discharge Destination: Home Transportation: Private Auto Accompanied By: self Schedule Follow-up Appointment: Yes Clinical Summary of Care: Electronic Signature(s) Signed: 04/06/2023 4:53:08 PM By: Midge Aver MSN RN CNS WTA Entered By: Midge Aver on 04/06/2023 08:17:04 -------------------------------------------------------------------------------- Lower Extremity Assessment Details Patient Name: Date of Service: Bridget Maldonado, Bridget Maldonado 04/06/2023 7:45 A M Medical Record Number: [id number] Patient Account Number: [id number] Date of Birth/Sex: Treating RN: 1980-12-29 (42 y.o. [name] Primary Care Arlie Posch: Tillman Abide Other Clinician: Referring Estel Tonelli: Treating Inita Uram/Extender: Derrek Monaco in Treatment: 1 Electronic Signature(s) Signed: 04/06/2023 4:53:08 PM By: Midge Aver MSN RN CNS WTA Entered By: Midge Aver on 04/06/2023 08:02:19 -------------------------------------------------------------------------------- Multi Wound Chart Details Patient Name: Date of Service: Bridget Lex. 04/06/2023 7:45 A M Medical Record Number: [id number] Patient Account Number: [id number] Date of Birth/Sex: Treating RN: 05/09/1981 (42 y.o. [name] Primary Care Unnamed Zeien: Tillman Abide Other Clinician: Referring Joya Willmott: Treating Alfard Cochrane/Extender: Derrek Monaco in Treatment: 1 Vital Signs Height(in): 64 Pulse(bpm): 103 Weight(lbs): 227 Blood Pressure(mmHg): 134/86 Body Mass Index(BMI): 39 Temperature(F): 97.5 Respiratory Rate(breaths/min): 18 [1:Photos:] [N/A:N/A] Right Chin N/A N/A Wound Location: Trauma N/A N/A Wounding Event: Trauma, Other N/A N/A Primary Etiology: Asthma, Hypertension N/A N/A Comorbid History: 03/20/2023 N/A N/A Date Acquired: 1 N/A N/A Weeks of Treatment: Open N/A N/A Wound Status: No N/A N/A Wound Recurrence: 0.7x0.7x0.1 N/A N/A Measurements L x W x D (cm) 0.385 N/A N/A A (cm) : rea 0.038 N/A N/A Volume (cm) : 59.10% N/A N/A % Reduction in A rea: 79.80% N/A N/A % Reduction in Volume: Full Thickness Without Exposed N/A N/A Classification: Support Structures Medium N/A N/A Exudate Amount: Serosanguineous N/A N/A Exudate Type: red, brown N/A N/A Exudate Color: Medium (34-66%) N/A N/A Granulation Amount: Red, Pink N/A N/A Granulation Quality: None Present (0%) N/A N/A Necrotic Amount: Fat Layer (Subcutaneous Tissue): Yes N/A N/A Exposed Structures: Fascia: No Tendon: No Muscle: No Joint: No Bone: No Small (1-33%) N/A N/A Epithelialization: Treatment Notes Electronic Signature(s) Signed: 04/06/2023 4:53:08 PM By: Midge Aver MSN RN CNS WTA Entered By: Midge Aver on 04/06/2023 08:02:26 -------------------------------------------------------------------------------- Multi-Disciplinary Care Plan Details Patient Name: Date of Service: Bridget Lex. 04/06/2023 7:45 A M Medical Record Number: [id number] Patient Account Number: [id number] Date of Birth/Sex: Treating RN: 03-21-1981 (42 y.o. [name] Primary Care Abegail Kloeppel: Tillman Abide Other Clinician: Referring Kaiya Boatman: Treating Sylvanus Telford/Extender: Derrek Monaco in Treatment: 1 Active  Inactive Wound/Skin Impairment Nursing Diagnoses: Impaired tissue integrity Knowledge deficit related to ulceration/compromised skin integrity Goals: Patient/caregiver will verbalize understanding of skin care regimen Date Initiated: 03/30/2023 Target Resolution Date: 05/01/2023 Goal Status: [address] Bridget Maldonado, Bridget Maldonado ([id number]) [id number] Page 5 of 8 Ulcer/skin breakdown will have a volume reduction of 30% by week 4 Date Initiated: 03/30/2023 Target Resolution Date: 04/30/2023 Goal Status: Active Ulcer/skin breakdown will have a volume reduction of 50% by week 8 Date Initiated: 03/30/2023 Target Resolution Date: 05/30/2023 Goal Status: Active Ulcer/skin breakdown will have a volume reduction of 80% by week 12 Date Initiated: 03/30/2023 Target Resolution Date: 06/30/2023 Goal Status: Active Interventions: Assess patient/caregiver ability to obtain necessary supplies Assess patient/caregiver ability to perform ulcer/skin care regimen upon admission and  as needed Assess ulceration(s) every visit Provide education on ulcer and skin care Treatment Activities: Skin care regimen initiated : 03/30/2023 Notes: Electronic Signature(s) Signed: 04/06/2023 4:53:08 PM By: Midge Aver MSN RN CNS WTA Entered By: Midge Aver on 04/06/2023 08:16:05 -------------------------------------------------------------------------------- Pain Assessment Details Patient Name: Date of Service: Bridget Lex. 04/06/2023 7:45 A M Medical Record Number: [id number] Patient Account Number: [id number] Date of Birth/Sex: Treating RN: December 26, 1980 (42 y.o. [name] Primary Care Onesimo Lingard: Tillman Abide Other Clinician: Referring Parthiv Mucci: Treating Betina Puckett/Extender: Derrek Monaco in Treatment: 1 Active Problems Location of Pain Severity and Description of Pain Patient Has Paino No Site Locations Pain Management and Medication Current Pain  Management: [facility], Bridget Maldonado ([id number]) [id number] Page 6 of 8 Electronic Signature(s) Signed: 04/06/2023 4:53:08 PM By: Midge Aver MSN RN CNS WTA Entered By: Midge Aver on 04/06/2023 07:57:31 -------------------------------------------------------------------------------- Patient/Caregiver Education Details Patient Name: Date of Service: Bridget Lex 10/14/2024andnbsp7:45 A M Medical Record Number: [id number] Patient Account Number: [id number] Date of Birth/Gender: Treating RN: 01/11/1981 (42 y.o. [name] Primary Care Physician: Tillman Abide Other Clinician: Referring Physician: Treating Physician/Extender: Derrek Monaco in Treatment: 1 Education Assessment Education Provided To: Patient Education Topics Provided Wound/Skin Impairment: Handouts: Caring for Your Ulcer Methods: Explain/Verbal Responses: State content correctly Electronic Signature(s) Signed: 04/06/2023 4:53:08 PM By: Midge Aver MSN RN CNS WTA Entered By: Midge Aver on 04/06/2023 08:16:19 -------------------------------------------------------------------------------- Wound Assessment Details Patient Name: Date of Service: Bridget Lex. 04/06/2023 7:45 A M Medical Record Number: [id number] Patient Account Number: [id number] Date of Birth/Sex: Treating RN: 24-Oct-1980 (42 y.o. [name] Primary Care Santhosh Gulino: Tillman Abide Other Clinician: Referring Kayleann Mccaffery: Treating Sandhya Denherder/Extender: Derrek Monaco in Treatment: 1 Wound Status Wound Number: 1 Primary Etiology: Trauma, Other Wound Location: Right Chin Wound Status: Open Wounding Event: Trauma Comorbid History: Asthma, Hypertension Date Acquired: 03/20/2023 Weeks Of Treatment: 1 Clustered Wound: No Bridget Maldonado, Bridget Maldonado ([id number]) [id number] Page 7 of 8 Photos Wound Measurements Length: (cm) 0.7 Width: (cm) 0.7 Depth: (cm)  0.1 Area: (cm) 0.385 Volume: (cm) 0.038 % Reduction in Area: 59.1% % Reduction in Volume: 79.8% Epithelialization: Small (1-33%) Wound Description Classification: Full Thickness Without Exposed Support Structures Exudate Amount: Medium Exudate Type: Serosanguineous Exudate Color: red, brown Foul Odor After Cleansing: No Slough/Fibrino Yes Wound Bed Granulation Amount: Medium (34-66%) Exposed Structure Granulation Quality: Red, Pink Fascia Exposed: No Necrotic Amount: None Present (0%) Fat Layer (Subcutaneous Tissue) Exposed: Yes Tendon Exposed: No Muscle Exposed: No Joint Exposed: No Bone Exposed: No Treatment Notes Wound #1 (Chin) Wound Laterality: Right Cleanser Soap and Water Discharge Instruction: Gently cleanse wound with antibacterial soap, rinse and pat dry prior to dressing wounds Peri-Wound Care Topical Primary Dressing Xeroform-HBD 2x2 (in/in) Discharge Instruction: Apply Xeroform-HBD 2x2 (in/in) as directed Secondary Dressing Coverlet Latex-Free Fabric Adhesive Dressings Discharge Instruction: Knuckle Secured With Compression Wrap Compression Stockings Add-Ons Electronic Signature(s) Signed: 04/06/2023 4:53:08 PM By: Midge Aver MSN RN CNS WTA Entered By: Midge Aver on 04/06/2023 08:00:24 Bridget Lex ([id number]) [id number] Page 8 of 8 -------------------------------------------------------------------------------- Vitals Details Patient Name: Date of Service: Bridget Maldonado, Bridget Maldonado 04/06/2023 7:45 A M Medical Record Number: [id number] Patient Account Number: [id number] Date of Birth/Sex: Treating RN: 04-22-1981 (42 y.o. [name] Primary Care Qusay Villada: Tillman Abide Other Clinician: Referring Amerigo Mcglory: Treating Kamirah Shugrue/Extender: Derrek Monaco in Treatment: 1 Vital Signs Time Taken: 07:54 Temperature (F): 97.5 Height (in): 64 Pulse (bpm): 103 Weight (lbs): 227 Respiratory Rate  (breaths/min):  18 Body Mass Index (BMI): 39 Blood Pressure (mmHg): 134/86 Reference Range: 80 - 120 mg / dl Electronic Signature(s) Signed: 04/06/2023 4:53:08 PM By: Midge Aver MSN RN CNS WTA Entered By: Midge Aver on 04/06/2023 07:57:26

## 2023-04-08 ENCOUNTER — Ambulatory Visit
Admission: EM | Admit: 2023-04-08 | Discharge: 2023-04-08 | Disposition: A | Discharge status: Home / Self Care | Payer: No Typology Code available for payment source

## 2023-04-08 ENCOUNTER — Encounter: Payer: Self-pay | Admitting: Emergency Medicine

## 2023-04-08 DIAGNOSIS — J069 Acute upper respiratory infection, unspecified: Secondary | ICD-10-CM

## 2023-04-08 DIAGNOSIS — R062 Wheezing: Secondary | ICD-10-CM | POA: Diagnosis not present

## 2023-04-08 MED ORDER — DEXAMETHASONE SODIUM PHOSPHATE 10 MG/ML IJ SOLN: 10.0000 mg | Freq: Once | INTRAMUSCULAR | Status: DC

## 2023-04-08 MED ORDER — PROMETHAZINE-DM 6.25-15 MG/5ML PO SYRP: 5.0000 mL | ORAL_SOLUTION | Freq: Four times a day (QID) | ORAL | 0 refills | Status: DC | PRN

## 2023-04-08 MED ORDER — PREDNISONE 10 MG (21) PO TBPK: ORAL_TABLET | Freq: Every day | ORAL | 0 refills | Status: DC

## 2023-04-08 NOTE — ED Provider Notes (Signed)
Bridget Maldonado    CSN: [id number] Arrival date & time: 04/08/23  4098      History   Chief Complaint Chief Complaint  Patient presents with   Cough   Wheezing   Headache    HPI Bridget Maldonado is a 42 y.o. female.   Patient presents for evaluation of a productive cough, shortness of breath with exertion, wheezing, and a constant headache, nasal congestion, subjective fever beginning 3 days ago.  Cough is primarily productive in the morning causing episodes of vomiting mucus, believes to be exacerbating sore throat and headache, interfering with sleep.  Possible sick contacts.  Tolerating food and liquids.  History of migraines.  Has attempted use of albuterol inhaler, cough drops and over-the-counter cold and flu which has been ineffective.  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   Increased risk of breast cancer 07/2017   IBIS=40%   Kidney infection    Pylonephritis when pregnant   LGSIL on Pap smear of cervix    Migraines    Obese    Psoriatic arthritis (HCC)    Secondary oligomenorrhea 07/16/2017    Patient Active Problem List   Diagnosis Date Noted   Rabies exposure 03/27/2023   History of CVA (cerebrovascular accident) 12/15/2022   Acute intractable headache 12/15/2022   Obesity (BMI 30-39.9) 12/06/2022   Hypokalemia 12/06/2022   Prediabetes 09/17/2022   Preventative health care 06/13/2020   Bipolar disease, chronic (HCC) 07/27/2018   Family history of colon cancer 07/27/2017   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   History of cervical dysplasia  07/16/2017   Family history of breast cancer 07/16/2017   Psoriatic arthritis (HCC) 10/28/2016   Chronic low back pain 10/28/2016   Essential hypertension 10/18/2014   Arthropathia 11/24/2013   Obstipation 04/12/2013   Migraine without intractability 09/08/2012   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 Lindie Spruce   TOTAL LAPAROSCOPIC HYSTERECTOMY WITH SALPINGECTOMY Bilateral 03/04/2022   Procedure: TOTAL LAPAROSCOPIC HYSTERECTOMY WITH SALPINGECTOMY;  Surgeon: Gunn[place] Bing, MD;  Location: [facility] OR;  Service: Gynecology;  Laterality: Bilateral;   TUBAL LIGATION  2017   TYMPANOSTOMY TUBE PLACEMENT      OB History     Gravida  2   Para  1   Term  1   Preterm      AB  1   Living  1      SAB  IAB  1   Ectopic      Multiple      Live Births           Obstetric Comments  Menstrual age: 36  Age 1st Pregnancy: 63           Home Medications    Prior to Admission medications   Medication Sig Start Date End Date Taking? Authorizing Provider  albuterol (PROVENTIL HFA;VENTOLIN HFA) 108 (90 Base) MCG/ACT inhaler Inhale 2 puffs into the lungs every 6 (six) hours as needed for wheezing or shortness of breath. 04/16/16  Yes Karie Schwalbe, MD  ALPRAZolam Prudy Feeler) 0.25 MG tablet Take 1 tablet (0.25 mg total) by mouth 3 (three) times daily as needed for anxiety. 03/19/23  Yes Tillman Abide I, MD  aspirin EC  81 MG tablet Take 81 mg by mouth daily. Swallow whole.   Yes [provider]  Atogepant 60 MG TABS Take by mouth. 03/12/23  Yes [provider]  atorvastatin (LIPITOR) 20 MG tablet TAKE ONE TABLET (20 MG TOTAL) BY MOUTH DAILY. 01/09/23  Yes Karie Schwalbe, MD  butalbital-acetaminophen-caffeine [facility]) [phone] MG tablet Take 1 tablet by mouth daily as needed for headache. 12/15/22  Yes Karie Schwalbe, MD  lisinopril-hydrochlorothiazide (ZESTORETIC) 20-12.5 MG tablet Take 1 tablet by mouth daily. 12/30/22  Yes Karie Schwalbe, MD  montelukast (SINGULAIR) 10 MG tablet Take 1 tablet (10 mg total) by mouth at bedtime. 09/17/22  Yes Karie Schwalbe, MD  predniSONE (STERAPRED UNI-PAK 21 TAB) 10 MG (21) TBPK tablet Take by mouth daily. Take 6 tabs by mouth daily  for 1 days, then 5 tabs for 1 days, then 4 tabs for 1 days, then 3 tabs for 1 days, 2 tabs for 1 days, then 1 tab by mouth daily for 1 days 04/08/23  Yes Romey Cohea R, NP  promethazine-dextromethorphan (PROMETHAZINE-DM) 6.25-15 MG/5ML syrup Take 5 mLs by mouth 4 (four) times daily as needed for cough. 04/08/23  Yes Jovin Fester R, NP  rizatriptan (MAXALT-MLT) 10 MG disintegrating tablet Take 1 tablet (10 mg total) by mouth as needed for migraine. May repeat in 2 hours if needed 02/18/23  Yes Jacalyn Lefevre, MD  traZODone (DESYREL) 50 MG tablet Take 1 tablet by mouth at bedtime. 03/12/23 03/11/24 Yes [provider]  bacitracin 500 UNIT/GM ointment Apply 1 Application topically 2 (two) times daily. 03/20/23   Cameron Ali, PA-C  DULoxetine (CYMBALTA) 60 MG capsule Take 1 capsule (60 mg total) by mouth daily. 12/15/22   Karie Schwalbe, MD  mometasone (ELOCON) 0.1 % ointment APPLY TWICE DAILY AS NEEDED FOR PSORIASIS 10/15/18   Karie Schwalbe, MD  nortriptyline (PAMELOR) 10 MG capsule Take 10 mg by mouth 2 (two) times daily.    [provider]  ondansetron (ZOFRAN-ODT) 4 MG disintegrating tablet  Take 1 tablet (4 mg total) by mouth every 8 (eight) hours as needed. 02/18/23   Jacalyn Lefevre, MD  traMADol (ULTRAM) 50 MG tablet Take 1 tablet (50 mg total) by mouth 2 (two) times daily as needed. 04/02/23   Karie Schwalbe, MD    Family History Family History  Problem Relation Age of Onset   Colon polyps Maternal Grandfather    Parkinson's disease Maternal Grandfather    Breast cancer Mother 15   Rheum arthritis Mother    Irritable bowel syndrome Mother    Asthma Father    Anxiety disorder Father        Severe, possible  bipolar   Irritable bowel syndrome Maternal Grandmother    Diabetes Other        mat. great uncles   Alzheimer's disease Other        Dad's side   Colon cancer Other        mat GGF    Social History Social History   Tobacco Use   Smoking status: Never    Passive exposure: Past   Smokeless tobacco: Never  Vaping Use   Vaping status: Never Used  Substance Use Topics   Alcohol use: No    Alcohol/week: 0.0 standard drinks of alcohol   Drug use: No     Allergies   Sulfa antibiotics, Cipro [ciprofloxacin hcl], and Effexor [venlafaxine hcl]   Review of Systems Review of Systems   Physical Exam Triage Vital Signs ED Triage Vitals  Encounter Vitals Group     BP 04/08/23 1008 (!) 151/113     Systolic BP Percentile --      Diastolic BP Percentile --      Pulse Rate 04/08/23 1008 (!) 102     Resp 04/08/23 1008 18     Temp 04/08/23 1008 98.2 F (36.8 C)     Temp Source 04/08/23 1008 Oral     SpO2 04/08/23 1008 98 %     Weight --      Height --      Head Circumference --      Peak Flow --      Pain Score 04/08/23 1005 3     Pain Loc --      Pain Education --      Exclude from Growth Chart --    No data found.  Updated Vital Signs BP (!) 151/113 (BP Location: Left Arm)   Pulse (!) 102   Temp 98.2 F (36.8 C) (Oral)   Resp 18   LMP 10/21/2021 (Approximate) Comment: Urine preg negatvie 03/04/22  SpO2 98%   Visual Acuity Right Eye  Distance:   Left Eye Distance:   Bilateral Distance:    Right Eye Near:   Left Eye Near:    Bilateral Near:     Physical Exam Constitutional:      Appearance: Normal appearance.  HENT:     Head: Normocephalic.     Right Ear: Tympanic membrane, ear canal and external ear normal.     Left Ear: Tympanic membrane, ear canal and external ear normal.     Nose: Congestion and rhinorrhea present.     Mouth/Throat:     Mouth: Mucous membranes are moist.     Pharynx: Oropharynx is clear. Posterior oropharyngeal erythema present. No oropharyngeal exudate.  Eyes:     Extraocular Movements: Extraocular movements intact.  Cardiovascular:     Rate and Rhythm: Normal rate and regular rhythm.     Pulses: Normal pulses.     Heart sounds: Normal heart sounds.  Pulmonary:     Effort: Pulmonary effort is normal.     Comments: Mild wheezing present in all lobes Musculoskeletal:     Cervical back: Normal range of motion and neck supple.  Lymphadenopathy:     Cervical: Cervical adenopathy present.  Skin:    General: Skin is warm and dry.  Neurological:     Mental Status: She is alert and oriented to person, place, and time. Mental status is at baseline.      UC Treatments / Results  Labs (all labs ordered are listed, but only abnormal results are displayed) Labs Reviewed -  No data to display  EKG   Radiology No results found.  Procedures Procedures (including critical care time)  Medications Ordered in UC Medications  dexamethasone (DECADRON) injection 10 mg (has no administration in time range)    Initial Impression / Assessment and Plan / UC Course  I have reviewed the triage vital signs and the nursing notes.  Pertinent labs & imaging results that were available during my care of the patient were reviewed by me and considered in my medical decision making (see chart for details).  Viral URI with cough, wheezing  Patient is in no signs of distress nor toxic appearing.   Vital signs are stable.  Low suspicion for pneumonia, pneumothorax or bronchitis and therefore will defer imaging.  Wheezing heard throughout all lobes, O2 saturation 98% stable for outpatient management, Decadron IM given prior to discharge, prescribed prednisone taper and Promethazine DM for outpatient, declined Tessalon, deemed ineffective in the past.May use additional over-the-counter medications as needed for supportive care.  May follow-up with urgent care as needed if symptoms persist or worsen.   Final Clinical Impressions(s) / UC Diagnoses   Final diagnoses:  Viral URI with cough  Wheezing     Discharge Instructions      Your symptoms today are most likely being caused by a virus and should steadily improve in time it can take up to 7 to 10 days before you truly start to see a turnaround however things will get better  Viruses most likely flared her asthma  You have been given an injection of steroids here today in office, ideally this will help to settle wheezing heard on exam as well as headache, daily will see improvement in the next 30 minutes to an hour  Starting tomorrow take prednisone as directed to continue reduction of inflammation throughout the body  May use cough syrup every 6 hours as needed, be mindful this will make you feel sleepy    You can take Tylenol and/or Ibuprofen as needed for fever reduction and pain relief.   For cough: honey 1/2 to 1 teaspoon (you can dilute the honey in water or another fluid).  You can also use guaifenesin and dextromethorphan for cough. You can use a humidifier for chest congestion and cough.  If you don't have a humidifier, you can sit in the bathroom with the hot shower running.      For sore throat: try warm salt water gargles, cepacol lozenges, throat spray, warm tea or water with lemon/honey, popsicles or ice, or OTC cold relief medicine for throat discomfort.   For congestion: take a daily anti-histamine like Zyrtec,  Claritin, and a oral decongestant, such as pseudoephedrine.  You can also use Flonase 1-2 sprays in each nostril daily.   It is important to stay hydrated: drink plenty of fluids (water, gatorade/powerade/pedialyte, juices, or teas) to keep your throat moisturized and help further relieve irritation/discomfort.    ED Prescriptions     Medication Sig Dispense Auth. Provider   predniSONE (STERAPRED UNI-PAK 21 TAB) 10 MG (21) TBPK tablet Take by mouth daily. Take 6 tabs by mouth daily  for 1 days, then 5 tabs for 1 days, then 4 tabs for 1 days, then 3 tabs for 1 days, 2 tabs for 1 days, then 1 tab by mouth daily for 1 days 21 tablet Michalina Calbert R, NP   promethazine-dextromethorphan (PROMETHAZINE-DM) 6.25-15 MG/5ML syrup Take 5 mLs by mouth 4 (four) times daily as needed for cough. 180 mL Renae Mottley R,  NP      PDMP not reviewed this encounter.   Valinda Hoar, NP 04/08/23 1028

## 2023-04-08 NOTE — ED Triage Notes (Signed)
Pt c/o cough, wheezing, fatigue and headache x 3 days. Pt has not taken any medication for her symptoms.

## 2023-04-08 NOTE — Discharge Instructions (Signed)
Your symptoms today are most likely being caused by a virus and should steadily improve in time it can take up to 7 to 10 days before you truly start to see a turnaround however things will get better  Viruses most likely flared her asthma  You have been given an injection of steroids here today in office, ideally this will help to settle wheezing heard on exam as well as headache, daily will see improvement in the next 30 minutes to an hour  Starting tomorrow take prednisone as directed to continue reduction of inflammation throughout the body  May use cough syrup every 6 hours as needed, be mindful this will make you feel sleepy    You can take Tylenol and/or Ibuprofen as needed for fever reduction and pain relief.   For cough: honey 1/2 to 1 teaspoon (you can dilute the honey in water or another fluid).  You can also use guaifenesin and dextromethorphan for cough. You can use a humidifier for chest congestion and cough.  If you don't have a humidifier, you can sit in the bathroom with the hot shower running.      For sore throat: try warm salt water gargles, cepacol lozenges, throat spray, warm tea or water with lemon/honey, popsicles or ice, or OTC cold relief medicine for throat discomfort.   For congestion: take a daily anti-histamine like Zyrtec, Claritin, and a oral decongestant, such as pseudoephedrine.  You can also use Flonase 1-2 sprays in each nostril daily.   It is important to stay hydrated: drink plenty of fluids (water, gatorade/powerade/pedialyte, juices, or teas) to keep your throat moisturized and help further relieve irritation/discomfort.

## 2023-04-09 ENCOUNTER — Ambulatory Visit: Payer: No Typology Code available for payment source | Admitting: Physician Assistant

## 2023-04-10 ENCOUNTER — Encounter: Payer: Self-pay | Admitting: Internal Medicine

## 2023-04-10 MED ORDER — HYDROCODONE BIT-HOMATROP MBR 5-1.5 MG/5ML PO SOLN: 5.0000 mL | Freq: Every evening | ORAL | 0 refills | Status: DC | PRN

## 2023-04-10 NOTE — Addendum Note (Signed)
Addended by: Tillman Abide I on: 04/10/2023 03:11 PM   Modules accepted: Orders

## 2023-04-13 ENCOUNTER — Encounter: Payer: No Typology Code available for payment source | Admitting: Physician Assistant

## 2023-04-13 DIAGNOSIS — L98492 Non-pressure chronic ulcer of skin of other sites with fat layer exposed: Secondary | ICD-10-CM | POA: Diagnosis not present

## 2023-04-13 NOTE — Progress Notes (Signed)
DORCIA, RIDDICK ([id number]) [id number] Page 1 of 5 Visit Report for 04/13/2023 Chief Complaint Document Details Patient Name: Date of Service: Bridget Maldonado, Bridget Maldonado 04/13/2023 7:45 A M Medical Record Number: [id number] Patient Account Number: [id number] Date of Birth/Sex: Treating RN: May 09, 1981 (42 y.o. [name] Primary Care Provider: Tillman Abide Other Clinician: Referring Provider: Treating Provider/Extender: Derrek Monaco in Treatment: 2 Information Obtained from: Patient Chief Complaint Dog bite right chin Electronic Signature(s) Signed: 04/13/2023 4:50:43 PM By: Allen Derry PA-C Entered By: Allen Derry on 04/13/2023 05:23:40 -------------------------------------------------------------------------------- HPI Details Patient Name: Date of Service: Bridget Maldonado. 04/13/2023 7:45 A M Medical Record Number: [id number] Patient Account Number: [id number] Date of Birth/Sex: Treating RN: 1981-05-18 (42 y.o. [name] Primary Care Provider: Tillman Abide Other Clinician: Referring Provider: Treating Provider/Extender: Derrek Monaco in Treatment: 2 History of Present Illness HPI Description: 03-30-2023 upon evaluation today patient appears for initial inspection here in clinic for a history of having had a dog bite which occurred on March 20, 2023. She tells me that this with a straight pick wound that was at her job and she was on break. The pitbull lunged in her face and bit her. Fortunately I do not see any signs of active infection at this time which is great news. No fevers, chills, nausea, vomiting, or diarrhea. Patient does have a history of hypertension but otherwise no other major medical problems at this point which is great news. 04-06-2023 upon evaluation today patient appears to be doing excellent in fact she is showing signs of great improvement in general I do believe that  the wound is moving along quite nicely. There is just a little bit of hypergranulation and I think that we can definitely treat that today to keep this moving along in the appropriate direction. 04-13-2023 upon evaluation today patient appears to be doing great in regard to her wound. This is actually showing signs of excellent improvement I think it is completely closed and scarring even appears to be minimal based on what I am seeing. Electronic Signature(s) Signed: 04/13/2023 2:13:48 PM By: Allen Derry PA-C Aul,Signed: 04/13/2023 2:13:48 PM By: Cala Bradford ([id number]) [id number] Page 2 of 5 Entered By: Allen Derry on 04/13/2023 11:13:48 -------------------------------------------------------------------------------- Physical Exam Details Patient Name: Date of Service: Bridget Maldonado, Bridget Maldonado 04/13/2023 7:45 A M Medical Record Number: [id number] Patient Account Number: [id number] Date of Birth/Sex: Treating RN: 1980/07/06 (42 y.o. [name] Primary Care Provider: Tillman Abide Other Clinician: Referring Provider: Treating Provider/Extender: Derrek Monaco in Treatment: 2 Constitutional Well-nourished and well-hydrated in no acute distress. Respiratory normal breathing without difficulty. Psychiatric this patient is able to make decisions and demonstrates good insight into disease process. Alert and Oriented x 3. pleasant and cooperative. Notes Upon inspection patient's wound bed actually showed signs of good granulation epithelization at this point. Fortunately I do not see any signs of worsening overall I believe that she is completely healed and I think that we need to have her at this point use AandD ointment regularly to keep this from having any more scarring that is absolutely necessary it will help with the remodeling of the skin over the next several weeks. Electronic Signature(s) Signed: 04/13/2023 2:14:57  PM By: Allen Derry PA-C Entered By: Allen Derry on 04/13/2023 11:14:57 -------------------------------------------------------------------------------- Physician Orders Details Patient Name: Date of Service: Bridget Maldonado, Bridget Maldonado 04/13/2023 7:45 A M Medical Record Number: [id number] Patient Account Number: [id number] Date of Birth/Sex: Treating  RN: 1980-10-22 (42 y.o. [name] Primary Care Provider: Tillman Abide Other Clinician: Referring Provider: Treating Provider/Extender: Derrek Monaco in Treatment: 2 The following information was scribed by: Midge Aver The information was scribed for: Allen Derry Verbal / Phone Orders: No Diagnosis Coding ICD-10 Coding Code Description W54.[address] by dog, initial encounter BRENLIE, CZAPLA ([id number]) [id number] Page 3 of 5 L98.492 Non-pressure chronic ulcer of skin of other sites with fat layer exposed I10 Essential (primary) hypertension Discharge From [facility]charge from [facility] Treatment Complete - apply A and D ointment to the area daily. Electronic Signature(s) Signed: 04/13/2023 4:50:43 PM By: Allen Derry PA-C Signed: 04/13/2023 5:06:23 PM By: Midge Aver MSN RN CNS WTA Entered By: Midge Aver on 04/13/2023 05:34:57 -------------------------------------------------------------------------------- Problem List Details Patient Name: Date of Service: Bridget Maldonado. 04/13/2023 7:45 A M Medical Record Number: [id number] Patient Account Number: [id number] Date of Birth/Sex: Treating RN: Jan 28, 1981 (42 y.o. [name] Primary Care Provider: Tillman Abide Other Clinician: Referring Provider: Treating Provider/Extender: Derrek Monaco in Treatment: 2 Active Problems ICD-10 Encounter Code Description Active Date MDM Diagnosis W54.0XXA Bitten by dog, initial encounter 03/30/2023 No Yes L98.492 Non-pressure chronic ulcer of skin  of other sites with fat layer exposed 03/30/2023 No Yes I10 Essential (primary) hypertension 03/30/2023 No Yes Inactive Problems Resolved Problems Electronic Signature(s) Signed: 04/13/2023 4:50:43 PM By: Allen Derry PA-C Entered By: Allen Derry on 04/13/2023 05:23:28 Bridget Maldonado ([id number]) [id number] Page 4 of 5 -------------------------------------------------------------------------------- Progress Note Details Patient Name: Date of Service: Bridget Maldonado, Bridget Maldonado 04/13/2023 7:45 A M Medical Record Number: [id number] Patient Account Number: [id number] Date of Birth/Sex: Treating RN: Oct 29, 1980 (42 y.o. [name] Primary Care Provider: Tillman Abide Other Clinician: Referring Provider: Treating Provider/Extender: Derrek Monaco in Treatment: 2 Subjective Chief Complaint Information obtained from Patient Dog bite right chin History of Present Illness (HPI) 03-30-2023 upon evaluation today patient appears for initial inspection here in clinic for a history of having had a dog bite which occurred on March 20, 2023. She tells me that this with a straight pick wound that was at her job and she was on break. The pitbull lunged in her face and bit her. Fortunately I do not see any signs of active infection at this time which is great news. No fevers, chills, nausea, vomiting, or diarrhea. Patient does have a history of hypertension but otherwise no other major medical problems at this point which is great news. 04-06-2023 upon evaluation today patient appears to be doing excellent in fact she is showing signs of great improvement in general I do believe that the wound is moving along quite nicely. There is just a little bit of hypergranulation and I think that we can definitely treat that today to keep this moving along in the appropriate direction. 04-13-2023 upon evaluation today patient appears to be doing great in regard to her  wound. This is actually showing signs of excellent improvement I think it is completely closed and scarring even appears to be minimal based on what I am seeing. Objective Constitutional Well-nourished and well-hydrated in no acute distress. Vitals Time Taken: 7:58 AM, Height: 64 in, Weight: 227 lbs, BMI: 39, Temperature: 98.4 F, Pulse: 82 bpm, Respiratory Rate: 18 breaths/min, Blood Pressure: 138/99 mmHg. Respiratory normal breathing without difficulty. Psychiatric this patient is able to make decisions and demonstrates good insight into disease process. Alert and Oriented x 3. pleasant and cooperative. General Notes: Upon  inspection patient's wound bed actually showed signs of good granulation epithelization at this point. Fortunately I do not see any signs of worsening overall I believe that she is completely healed and I think that we need to have her at this point use AandD ointment regularly to keep this from having any more scarring that is absolutely necessary it will help with the remodeling of the skin over the next several weeks. Integumentary (Hair, Skin) Wound #1 status is Healed - Epithelialized. Original cause of wound was Trauma. The date acquired was: 03/20/2023. The wound has been in treatment 2 weeks. The wound is located on the Right Chin. The wound measures 0cm length x 0cm width x 0cm depth; 0cm^2 area and 0cm^3 volume. There is Fat Layer (Subcutaneous Tissue) exposed. There is a medium amount of serosanguineous drainage noted. There is medium (34-66%) red, pink granulation within the wound bed. There is no necrotic tissue within the wound bed. Assessment Active Problems ICD-10 Bitten by dog, initial encounter Non-pressure chronic ulcer of skin of other sites with fat layer exposed Essential (primary) hypertension Plan Discharge From [facility] Services: Discharge from Wound Care Center Treatment Complete - apply A and D ointment to the area daily. Bridget Maldonado, Bridget Maldonado  ([id number]) [id number] Page 5 of 5 1. Based on what I am seeing I am going to recommend that the patient should use AandD ointment over the next few weeks and she is in agreement with that plan. We will subsequently see how things do from the standpoint of healing if she has any concerns or complications she will contact the office let me know otherwise the plan will be to just see her back for follow-up visit as needed at this point. Follow-up as needed. Electronic Signature(s) Signed: 04/13/2023 2:15:21 PM By: Allen Derry PA-C Entered By: Allen Derry on 04/13/2023 11:15:21 -------------------------------------------------------------------------------- SuperBill Details Patient Name: Date of Service: Bridget Maldonado, Bridget Maldonado 04/13/2023 Medical Record Number: [id number] Patient Account Number: [id number] Date of Birth/Sex: Treating RN: [birth year] (42 y.o. [name] Primary Care Provider: Tillman Abide Other Clinician: Referring Provider: Treating Provider/Extender: Derrek Monaco in Treatment: 2 Diagnosis Coding ICD-10 Codes Code Description [id number] Bitten by dog, initial encounter L98.492 Non-pressure chronic ulcer of skin of other sites with fat layer exposed I10 Essential (primary) hypertension Facility Procedures : CPT4 Code: [id number] Description: 46962 - WOUND CARE VISIT-LEV 2 EST PT Modifier: Quantity: 1 Physician Procedures : CPT4 Code Description Modifier [id number] - WC PHYS LEVEL 3 - EST PT ICD-10 Diagnosis Description W54.0XXA Bitten by dog, initial encounter [phone] Non-pressure chronic ulcer of skin of other sites with fat layer exposed I10 Essential (primary)  hypertension Quantity: 1 Electronic Signature(s) Signed: 04/13/2023 2:16:08 PM By: Allen Derry PA-C Entered By: Allen Derry on 04/13/2023 11:16:08

## 2023-04-13 NOTE — Progress Notes (Signed)
Bridget Maldonado ([id number]) [id number] Page 1 of 8 Visit Report for 04/13/2023 Arrival Information Details Patient Name: Date of Service: Bridget Maldonado, Bridget Maldonado 04/13/2023 7:45 A Maldonado Medical Record Number: [id number] Patient Account Number: [id number] Date of Birth/Sex: Treating RN: 03/19/1981 (42 y.o. [name] Primary Care Caden Fatica: Tillman Abide Other Clinician: Referring Cortnee Steinmiller: Treating Raelene Trew/Extender: Derrek Monaco in Treatment: 2 Visit Information History Since Last Visit Added or deleted any medications: No Patient Arrived: Ambulatory Any new allergies or adverse reactions: No Arrival Time: 07:43 Has Dressing in Place as Prescribed: Yes Accompanied By: self Pain Present Now: No Transfer Assistance: None Patient Identification Verified: Yes Secondary Verification Process Completed: Yes Patient Requires Transmission-Based Precautions: No Patient Has Alerts: Yes Patient Alerts: ASA 81 mg Electronic Signature(s) Signed: 04/13/2023 5:06:23 PM By: Midge Aver MSN RN CNS WTA Entered By: Midge Aver on 04/13/2023 04:58:45 -------------------------------------------------------------------------------- Clinic Level of Care Assessment Details Patient Name: Date of Service: Bridget Maldonado 04/13/2023 7:45 A Maldonado Medical Record Number: [id number] Patient Account Number: [id number] Date of Birth/Sex: Treating RN: April 13, 1981 (42 y.o. [name] Primary Care Rashun Grattan: Tillman Abide Other Clinician: Referring Pawel Soules: Treating Bianca Vester/Extender: Derrek Monaco in Treatment: 2 Clinic Level of Care Assessment Items TOOL 4 Quantity Score X- 1 0 Use when only an EandM is performed on FOLLOW-UP visit ASSESSMENTS - Nursing Assessment / Reassessment X- 1 10 Reassessment of Co-morbidities (includes updates in patient status) X- 1 5 Reassessment of Adherence to Treatment Plan ASSESSMENTS - Wound and  Skin A ssessment / Reassessment X - Simple Wound Assessment / Reassessment - one wound 1 5 Bridget Maldonado ([id number]) [id number] Page 2 of 8 []  - 0 Complex Wound Assessment / Reassessment - multiple wounds []  - 0 Dermatologic / Skin Assessment (not related to wound area) ASSESSMENTS - Focused Assessment []  - 0 Circumferential Edema Measurements - multi extremities []  - 0 Nutritional Assessment / Counseling / Intervention []  - 0 Lower Extremity Assessment (monofilament, tuning fork, pulses) []  - 0 Peripheral Arterial Disease Assessment (using hand held doppler) ASSESSMENTS - Ostomy and/or Continence Assessment and Care []  - 0 Incontinence Assessment and Management []  - 0 Ostomy Care Assessment and Management (repouching, etc.) PROCESS - Coordination of Care X - Simple Patient / Family Education for ongoing care 1 15 []  - 0 Complex (extensive) Patient / Family Education for ongoing care X- 1 10 Staff obtains Chiropractor, Records, T Results / Process Orders est []  - 0 Staff telephones HHA, Nursing Homes / Clarify orders / etc []  - 0 Routine Transfer to another Facility (non-emergent condition) []  - 0 Routine Hospital Admission (non-emergent condition) []  - 0 New Admissions / Manufacturing engineer / Ordering NPWT Apligraf, etc. , []  - 0 Emergency Hospital Admission (emergent condition) X- 1 10 Simple Discharge Coordination []  - 0 Complex (extensive) Discharge Coordination PROCESS - Special Needs []  - 0 Pediatric / Minor Patient Management []  - 0 Isolation Patient Management []  - 0 Hearing / Language / Visual special needs []  - 0 Assessment of Community assistance (transportation, D/C planning, etc.) []  - 0 Additional assistance / Altered mentation []  - 0 Support Surface(s) Assessment (bed, cushion, seat, etc.) INTERVENTIONS - Wound Cleansing / Measurement X - Simple Wound Cleansing - one wound 1 5 []  - 0 Complex Wound Cleansing -  multiple wounds X- 1 5 Wound Imaging (photographs - any number of wounds) []  - 0 Wound Tracing (instead of photographs) X- 1 5 Simple Wound Measurement - one wound []  -  0 Complex Wound Measurement - multiple wounds INTERVENTIONS - Wound Dressings []  - 0 Small Wound Dressing one or multiple wounds []  - 0 Medium Wound Dressing one or multiple wounds []  - 0 Large Wound Dressing one or multiple wounds []  - 0 Application of Medications - topical []  - 0 Application of Medications - injection INTERVENTIONS - Miscellaneous []  - 0 External ear exam []  - 0 Specimen Collection (cultures, biopsies, blood, body fluids, etc.) []  - 0 Specimen(s) / Culture(s) sent or taken to Lab for analysis Bridget Maldonado, Bridget Maldonado ([id number]) [id number] Page 3 of 8 []  - 0 Patient Transfer (multiple staff / Nurse, adult / Similar devices) []  - 0 Simple Staple / Suture removal (25 or less) []  - 0 Complex Staple / Suture removal (26 or more) []  - 0 Hypo / Hyperglycemic Management (close monitor of Blood Glucose) []  - 0 Ankle / Brachial Index (ABI) - do not check if billed separately X- 1 5 Vital Signs Has the patient been seen at the hospital within the last three years: Yes Total Score: 75 Level Of Care: New/Established - Level 2 Electronic Signature(s) Signed: 04/13/2023 5:06:23 PM By: Midge Aver MSN RN CNS WTA Entered By: Midge Aver on 04/13/2023 05:35:30 -------------------------------------------------------------------------------- Encounter Discharge Information Details Patient Name: Date of Service: Bridget Maldonado. 04/13/2023 7:45 A Maldonado Medical Record Number: [id number] Patient Account Number: [id number] Date of Birth/Sex: Treating RN: Nov 26, 1980 (42 y.o. [name] Primary Care Pepper Wyndham: Tillman Abide Other Clinician: Referring Jeanmarc Viernes: Treating Denver Bentson/Extender: Derrek Monaco in Treatment: 2 Encounter Discharge Information  Items Discharge Condition: Stable Ambulatory Status: Ambulatory Discharge Destination: Home Transportation: Private Auto Accompanied By: self Schedule Follow-up Appointment: No Clinical Summary of Care: Electronic Signature(s) Signed: 04/13/2023 5:06:23 PM By: Midge Aver MSN RN CNS WTA Entered By: Midge Aver on 04/13/2023 05:36:39 -------------------------------------------------------------------------------- Lower Extremity Assessment Details Patient Name: Date of Service: Bridget Maldonado, Bridget Maldonado 04/13/2023 7:45 A Maldonado Medical Record Number: [id number] Patient Account Number: [id number] Date of Birth/Sex: Treating RN: Dec 18, 1980 (42 y.o. [name] Primary Care Jameriah Trotti: Tillman Abide Other Clinician: ONIESHA, LASKO ([id number]) [id number] Page 4 of 8 Referring Araf Clugston: Treating Ishani Goldwasser/Extender: Derrek Monaco in Treatment: 2 Electronic Signature(s) Signed: 04/13/2023 5:06:23 PM By: Midge Aver MSN RN CNS WTA Entered By: Midge Aver on 04/13/2023 05:06:03 -------------------------------------------------------------------------------- Multi Wound Chart Details Patient Name: Date of Service: Bridget Maldonado. 04/13/2023 7:45 A Maldonado Medical Record Number: [id number] Patient Account Number: [id number] Date of Birth/Sex: Treating RN: 11/18/1980 (42 y.o. [name] Primary Care Del Wiseman: Tillman Abide Other Clinician: Referring Shannen Flansburg: Treating Rupert Azzara/Extender: Derrek Monaco in Treatment: 2 Vital Signs Height(in): 64 Pulse(bpm): 82 Weight(lbs): 227 Blood Pressure(mmHg): 138/99 Body Mass Index(BMI): 39 Temperature(F): 98.4 Respiratory Rate(breaths/min): 18 [1:Photos:] [N/A:N/A] Right Chin N/A N/A Wound Location: Trauma N/A N/A Wounding Event: Trauma, Other N/A N/A Primary Etiology: Asthma, Hypertension N/A N/A Comorbid History: 03/20/2023 N/A N/A Date Acquired: 2 N/A N/A Weeks of  Treatment: Healed - Epithelialized N/A N/A Wound Status: No N/A N/A Wound Recurrence: 0x0x0 N/A N/A Measurements L x W x D (cm) 0 N/A N/A A (cm) : rea 0 N/A N/A Volume (cm) : 98.30% N/A N/A % Reduction in A rea: 98.90% N/A N/A % Reduction in Volume: Full Thickness Without Exposed N/A N/A Classification: Support Structures Medium N/A N/A Exudate Amount: Serosanguineous N/A N/A Exudate Type: red, brown N/A N/A Exudate Color: Medium (34-66%) N/A N/A Granulation Amount: Red, Pink N/A N/A Granulation Quality: None Present (0%) N/A  N/A Necrotic Amount: Fat Layer (Subcutaneous Tissue): Yes N/A N/A Exposed Structures: Fascia: No Tendon: No Muscle: No Joint: No Bone: No Small (1-33%) N/A N/A EpithelializationSHAMONICA, Bridget Maldonado ([id number]) [id number] Page 5 of 8 Treatment Notes Electronic Signature(s) Signed: 04/13/2023 5:06:23 PM By: Midge Aver MSN RN CNS WTA Entered By: Midge Aver on 04/13/2023 05:33:34 -------------------------------------------------------------------------------- Multi-Disciplinary Care Plan Details Patient Name: Date of Service: Bridget Maldonado, Bridget Maldonado. 04/13/2023 7:45 A Maldonado Medical Record Number: [id number] Patient Account Number: [id number] Date of Birth/Sex: Treating RN: 04/03/1981 (42 y.o. [name] Primary Care Aylinn Rydberg: Tillman Abide Other Clinician: Referring Karizma Cheek: Treating Esthela Brandner/Extender: Derrek Monaco in Treatment: 2 Active Inactive Electronic Signature(s) Signed: 04/13/2023 5:06:23 PM By: Midge Aver MSN RN CNS WTA Entered By: Midge Aver on 04/13/2023 05:35:50 -------------------------------------------------------------------------------- Pain Assessment Details Patient Name: Date of Service: Bridget Maldonado, Bridget Maldonado 04/13/2023 7:45 A Maldonado Medical Record Number: [id number] Patient Account Number: [id number] Date of Birth/Sex: Treating RN: [birth year] (42 y.o. [name] Primary Care Harmon Bommarito: Tillman Abide Other Clinician: Referring Anastasha Ortez: Treating Lillien Petronio/Extender: Derrek Monaco in Treatment: 2 Active Problems Location of Pain Severity and Description of Pain Patient Has Paino No Site Locations [place], [place] ([id number]) [id number] Page 6 of 8 Pain Management and Medication Current Pain Management: Electronic Signature(s) Signed: 04/13/2023 5:06:23 PM By: Midge Aver MSN RN CNS WTA Entered By: Midge Aver on 04/13/2023 05:02:38 -------------------------------------------------------------------------------- Patient/Caregiver Education Details Patient Name: Date of Service: Bridget Maldonado 10/21/2024andnbsp7:45 A Maldonado Medical Record Number: [id number] Patient Account Number: [id number] Date of Birth/Gender: Treating RN: 03-11-1981 (42 y.o. [name] Primary Care Physician: Tillman Abide Other Clinician: Referring Physician: Treating Physician/Extender: Derrek Monaco in Treatment: 2 Education Assessment Education Provided To: Patient Education Topics Provided Discharge Packet: Methods: Explain/Verbal Responses: State content correctly Electronic Signature(s) Signed: 04/13/2023 5:06:23 PM By: Midge Aver MSN RN CNS WTA Entered By: Midge Aver on 04/13/2023 05:36:06 Stradling, [name] ([id number]) [id number] Page 7 of 8 -------------------------------------------------------------------------------- Wound Assessment Details Patient Name: Date of Service: Bridget Maldonado, Bridget Maldonado 04/13/2023 7:45 A Maldonado Medical Record Number: [id number] Patient Account Number: [id number] Date of Birth/Sex: Treating RN: 10/17/1980 (42 y.o. [name] Primary Care Eveleen Mcnear: Tillman Abide Other Clinician: Referring Tayler Heiden: Treating Valjean Ruppel/Extender: Derrek Monaco in Treatment: 2 Wound Status Wound Number: 1 Primary  Etiology: Trauma, Other Wound Location: Right Chin Wound Status: Healed - Epithelialized Wounding Event: Trauma Comorbid History: Asthma, Hypertension Date Acquired: 03/20/2023 Weeks Of Treatment: 2 Clustered Wound: No Photos Wound Measurements Length: (cm) Width: (cm) Depth: (cm) Area: (cm) Volume: (cm) 0 % Reduction in Area: 98.3% 0 % Reduction in Volume: 98.9% 0 Epithelialization: Small (1-33%) 0 0 Wound Description Classification: Full Thickness Without Exposed Suppor Exudate Amount: Medium Exudate Type: Serosanguineous Exudate Color: red, brown t Structures Foul Odor After Cleansing: No Slough/Fibrino Yes Wound Bed Granulation Amount: Medium (34-66%) Exposed Structure Granulation Quality: Red, Pink Fascia Exposed: No Necrotic Amount: None Present (0%) Fat Layer (Subcutaneous Tissue) Exposed: Yes Tendon Exposed: No Muscle Exposed: No Joint Exposed: No Bone Exposed: No Treatment Notes Wound #1 (Chin) Wound Laterality: Right Cleanser Peri-Wound Care Topical CATRENA, THURN ([id number]) [id number] Page 8 of 8 Primary Dressing Secondary Dressing Secured With Compression Wrap Compression Stockings Add-Ons Electronic Signature(s) Signed: 04/13/2023 5:06:23 PM By: Midge Aver MSN RN CNS WTA Entered By: Midge Aver on 04/13/2023 05:33:09 -------------------------------------------------------------------------------- Vitals Details Patient Name: Date of Service: Bridget Maldonado. 04/13/2023 7:45 A Maldonado Medical Record Number: [id number] Patient Account  Number: [id number] Date of Birth/Sex: Treating RN: Mar 19, 1981 (42 y.o. [name] Primary Care Harout Scheurich: Tillman Abide Other Clinician: Referring Anees Vanecek: Treating Jenella Craigie/Extender: Derrek Monaco in Treatment: 2 Vital Signs Time Taken: 07:58 Temperature (F): 98.4 Height (in): 64 Pulse (bpm): 82 Weight (lbs): 227 Respiratory Rate (breaths/min):  18 Body Mass Index (BMI): 39 Blood Pressure (mmHg): 138/99 Reference Range: 80 - 120 mg / dl Electronic Signature(s) Signed: 04/13/2023 5:06:23 PM By: Midge Aver MSN RN CNS WTA Entered By: Midge Aver on 04/13/2023 05:01:56

## 2023-04-24 ENCOUNTER — Other Ambulatory Visit: Payer: Self-pay | Admitting: Internal Medicine

## 2023-04-24 MED ORDER — ALPRAZOLAM 0.25 MG PO TABS: 0.2500 mg | ORAL_TABLET | Freq: Three times a day (TID) | ORAL | 0 refills | Status: DC | PRN

## 2023-05-01 ENCOUNTER — Other Ambulatory Visit: Payer: Self-pay | Admitting: Internal Medicine

## 2023-05-01 MED ORDER — TRAMADOL HCL 50 MG PO TABS: 50.0000 mg | ORAL_TABLET | Freq: Two times a day (BID) | ORAL | 0 refills | Status: DC | PRN

## 2023-05-11 ENCOUNTER — Encounter: Payer: Self-pay | Admitting: Internal Medicine

## 2023-05-11 MED ORDER — BENZONATATE 200 MG PO CAPS: 200.0000 mg | ORAL_CAPSULE | Freq: Three times a day (TID) | ORAL | 0 refills | Status: DC | PRN

## 2023-05-27 ENCOUNTER — Other Ambulatory Visit: Payer: Self-pay | Admitting: Internal Medicine

## 2023-05-27 MED ORDER — TRAMADOL HCL 50 MG PO TABS: 50.0000 mg | ORAL_TABLET | Freq: Two times a day (BID) | ORAL | 0 refills | Status: DC | PRN

## 2023-05-27 NOTE — Telephone Encounter (Signed)
Last filled 05-01-23 #30 Last OV 02-11-23 Next OV 09-14-23 [facility]

## 2023-06-09 ENCOUNTER — Encounter: Payer: Self-pay | Admitting: Internal Medicine

## 2023-06-10 ENCOUNTER — Encounter: Payer: Self-pay | Admitting: Internal Medicine

## 2023-06-10 ENCOUNTER — Telehealth (INDEPENDENT_AMBULATORY_CARE_PROVIDER_SITE_OTHER): Payer: No Typology Code available for payment source | Admitting: Internal Medicine

## 2023-06-10 DIAGNOSIS — R053 Chronic cough: Secondary | ICD-10-CM | POA: Insufficient documentation

## 2023-06-10 MED ORDER — HYDROCODONE BIT-HOMATROP MBR 5-1.5 MG/5ML PO SOLN: 5.0000 mL | Freq: Every evening | ORAL | 0 refills | Status: DC | PRN

## 2023-06-10 MED ORDER — AZITHROMYCIN 250 MG PO TABS: ORAL_TABLET | ORAL | 0 refills | Status: DC

## 2023-06-10 NOTE — Progress Notes (Signed)
Subjective:    Patient ID: Bridget Maldonado, female    DOB: 24-Jul-1980, 42 y.o.   MRN: [id number]  HPI Video virtual visit due to cough Identification done Reviewed limitations and billing and she gave consent Participants--patient is at work and I am in my office  Has been struggling with cough for weeks Using phenergan cough syrup and tessalon, etc Daughter just diagnosed with pneumonia after sent home from school with fever   Cough worse in the past week--especially bad at night (though she sleeps through it) Using some prednisone that she had Albuterol inhaler No fever Very tired though Seen at urgent care 9 days ago--augmentin may have helped some (but cough has persisted) Not SOB  Current Outpatient Medications on File Prior to Visit  Medication Sig Dispense Refill   albuterol (PROVENTIL HFA;VENTOLIN HFA) 108 (90 Base) MCG/ACT inhaler Inhale 2 puffs into the lungs every 6 (six) hours as needed for wheezing or shortness of breath. 1 Inhaler 1   ALPRAZolam (XANAX) 0.25 MG tablet Take 1 tablet (0.25 mg total) by mouth 3 (three) times daily as needed for anxiety. 60 tablet 0   aspirin EC 81 MG tablet Take 81 mg by mouth daily. Swallow whole.     Atogepant 60 MG TABS Take by mouth.     atorvastatin (LIPITOR) 20 MG tablet TAKE ONE TABLET (20 MG TOTAL) BY MOUTH DAILY. 30 tablet 11   bacitracin 500 UNIT/GM ointment Apply 1 Application topically 2 (two) times daily. 15 g 3   benzonatate (TESSALON) 200 MG capsule Take 1 capsule (200 mg total) by mouth 3 (three) times daily as needed for cough. 60 capsule 0   butalbital-acetaminophen-caffeine (FIORICET) 50-325-40 MG tablet Take 1 tablet by mouth daily as needed for headache. 14 tablet 0   DULoxetine (CYMBALTA) 60 MG capsule Take 1 capsule (60 mg total) by mouth daily. 30 capsule 3   lisinopril-hydrochlorothiazide (ZESTORETIC) 20-12.5 MG tablet Take 1 tablet by mouth daily. 90 tablet 3   mometasone (ELOCON) 0.1 % ointment APPLY TWICE  DAILY AS NEEDED FOR PSORIASIS 45 g 1   montelukast (SINGULAIR) 10 MG tablet Take 1 tablet (10 mg total) by mouth at bedtime. 90 tablet 3   nortriptyline (PAMELOR) 10 MG capsule Take 10 mg by mouth 2 (two) times daily.     ondansetron (ZOFRAN-ODT) 4 MG disintegrating tablet Take 1 tablet (4 mg total) by mouth every 8 (eight) hours as needed. 20 tablet 0   predniSONE (STERAPRED UNI-PAK 21 TAB) 10 MG (21) TBPK tablet Take by mouth daily. Take 6 tabs by mouth daily  for 1 days, then 5 tabs for 1 days, then 4 tabs for 1 days, then 3 tabs for 1 days, 2 tabs for 1 days, then 1 tab by mouth daily for 1 days 21 tablet 0   rizatriptan (MAXALT-MLT) 10 MG disintegrating tablet Take 1 tablet (10 mg total) by mouth as needed for migraine. May repeat in 2 hours if needed 10 tablet 0   traMADol (ULTRAM) 50 MG tablet Take 1 tablet (50 mg total) by mouth 2 (two) times daily as needed. 30 tablet 0   traZODone (DESYREL) 50 MG tablet Take 1 tablet by mouth at bedtime.     No current facility-administered medications on file prior to visit.    Allergies  Allergen Reactions   Sulfa Antibiotics Nausea And Vomiting   Cipro [Ciprofloxacin Hcl] Nausea And Vomiting   Effexor [Venlafaxine Hcl] Other (See Comments)    Excessive sleeping, slept for  2 days    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 Joseph Bing, MD;  Location: [facility] OR;  Service: Gynecology;  Laterality: N/A;   EAR TUBE REMOVAL     ENDOMETRIAL BIOPSY  12/23/2021   ESOPHAGOGASTRODUODENOSCOPY     LAPAROSCOPIC CHOLECYSTECTOMY  6/09   Dr. Lindie Spruce   TOTAL LAPAROSCOPIC HYSTERECTOMY WITH SALPINGECTOMY Bilateral 03/04/2022   Procedure: TOTAL LAPAROSCOPIC HYSTERECTOMY WITH SALPINGECTOMY;  Surgeon: Elkins Bing, MD;  Location: [facility] OR;  Service: Gynecology;  Laterality: Bilateral;   TUBAL LIGATION  2017   TYMPANOSTOMY TUBE PLACEMENT      Family History  Problem Relation Age of Onset   Colon polyps Maternal Grandfather    Parkinson's disease Maternal Grandfather    Breast cancer Mother 54   Rheum arthritis Mother    Irritable bowel syndrome Mother    Asthma Father    Anxiety disorder Father        Severe, possible bipolar   Irritable bowel syndrome Maternal Grandmother    Diabetes Other        mat. great uncles   Alzheimer's disease Other        Dad's side   Colon cancer Other        mat GGF    Social History   Socioeconomic History   Marital status: Legally Separated    Spouse name: Not on file   Number of children: 1   Years of education: Not on file   Highest education level: Not on file  Occupational History   Occupation: Accounting    Comment: [employer]  Tobacco Use   Smoking status: Never    Passive exposure: Past   Smokeless tobacco: Never  Vaping Use   Vaping status: Never Used  Substance and Sexual Activity   Alcohol use: No    Alcohol/week: 0.0 standard drinks of alcohol   Drug use: No   Sexual activity: Yes    Birth control/protection: Surgical  Other Topics Concern   Not on file  Social History Narrative   Married 10/22   Social Drivers of Health   Financial  Resource Strain: Not on file  Food Insecurity: No Food Insecurity (12/09/2022)   Hunger Vital Sign    Worried About Running Out of Food in the Last Year: Never true    Ran Out of Food in the Last Year: Never true  Transportation Needs: Not on file  Physical Activity: Not on file  Stress: Not on file  Social Connections: Not on file  Intimate Partner Violence: Not on file   Review of Systems No N/V Eating okay    Objective:   Physical Exam         Assessment & Plan:

## 2023-06-10 NOTE — Assessment & Plan Note (Signed)
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

## 2023-06-15 ENCOUNTER — Emergency Department: Payer: No Typology Code available for payment source

## 2023-06-15 ENCOUNTER — Emergency Department
Admission: EM | Admit: 2023-06-15 | Discharge: 2023-06-15 | Disposition: A | Discharge status: Home / Self Care | Payer: No Typology Code available for payment source | Attending: Emergency Medicine | Admitting: Emergency Medicine

## 2023-06-15 ENCOUNTER — Other Ambulatory Visit: Payer: Self-pay

## 2023-06-15 DIAGNOSIS — J45901 Unspecified asthma with (acute) exacerbation: Secondary | ICD-10-CM | POA: Insufficient documentation

## 2023-06-15 DIAGNOSIS — Z7982 Long term (current) use of aspirin: Secondary | ICD-10-CM | POA: Diagnosis not present

## 2023-06-15 DIAGNOSIS — I10 Essential (primary) hypertension: Secondary | ICD-10-CM | POA: Insufficient documentation

## 2023-06-15 DIAGNOSIS — Z20822 Contact with and (suspected) exposure to covid-19: Secondary | ICD-10-CM | POA: Insufficient documentation

## 2023-06-15 DIAGNOSIS — J40 Bronchitis, not specified as acute or chronic: Secondary | ICD-10-CM

## 2023-06-15 DIAGNOSIS — Z79899 Other long term (current) drug therapy: Secondary | ICD-10-CM | POA: Insufficient documentation

## 2023-06-15 DIAGNOSIS — J069 Acute upper respiratory infection, unspecified: Secondary | ICD-10-CM | POA: Diagnosis not present

## 2023-06-15 DIAGNOSIS — R059 Cough, unspecified: Secondary | ICD-10-CM | POA: Diagnosis present

## 2023-06-15 LAB — CBC
HCT: 43.8 % (ref 36.0–46.0)
Hemoglobin: 14.3 g/dL (ref 12.0–15.0)
MCH: 27.9 pg (ref 26.0–34.0)
MCHC: 32.6 g/dL (ref 30.0–36.0)
MCV: 85.5 fL (ref 80.0–100.0)
Platelets: 248 10*3/uL (ref 150–400)
RBC: 5.12 MIL/uL — ABNORMAL HIGH (ref 3.87–5.11)
RDW: 12.9 % (ref 11.5–15.5)
WBC: 8 10*3/uL (ref 4.0–10.5)
nRBC: 0 % (ref 0.0–0.2)

## 2023-06-15 LAB — RESP PANEL BY RT-PCR (RSV, FLU A&B, COVID)  RVPGX2
Influenza A by PCR: NEGATIVE
Influenza B by PCR: NEGATIVE
Resp Syncytial Virus by PCR: NEGATIVE
SARS Coronavirus 2 by RT PCR: NEGATIVE

## 2023-06-15 LAB — COMPREHENSIVE METABOLIC PANEL
ALT: 31 U/L (ref 0–44)
AST: 37 U/L (ref 15–41)
Albumin: 3.8 g/dL (ref 3.5–5.0)
Alkaline Phosphatase: 121 U/L (ref 38–126)
Anion gap: 9 (ref 5–15)
BUN: 12 mg/dL (ref 6–20)
CO2: 23 mmol/L (ref 22–32)
Calcium: 8.4 mg/dL — ABNORMAL LOW (ref 8.9–10.3)
Chloride: 104 mmol/L (ref 98–111)
Creatinine, Ser: 0.73 mg/dL (ref 0.44–1.00)
GFR, Estimated: >60 mL/min (ref >60)
Glucose, Bld: 109 mg/dL — ABNORMAL HIGH (ref 70–99)
Potassium: 4 mmol/L (ref 3.5–5.1)
Sodium: 136 mmol/L (ref 135–145)
Total Bilirubin: 1.4 mg/dL — ABNORMAL HIGH (ref <1.2)
Total Protein: 7.6 g/dL (ref 6.5–8.1)

## 2023-06-15 LAB — TROPONIN I (HIGH SENSITIVITY): Troponin I (High Sensitivity): 4 ng/L (ref <18)

## 2023-06-15 MED ORDER — IPRATROPIUM-ALBUTEROL 0.5-2.5 (3) MG/3ML IN SOLN
3.0000 mL | Freq: Once | RESPIRATORY_TRACT | Status: AC
  Administered 2023-06-15: 3 mL via RESPIRATORY_TRACT
  Filled 2023-06-15: qty 3

## 2023-06-15 MED ORDER — HYDROCOD POLI-CHLORPHE POLI ER 10-8 MG/5ML PO SUER
5.0000 mL | Freq: Once | ORAL | Status: AC
  Administered 2023-06-15: 5 mL via ORAL
  Filled 2023-06-15: qty 5

## 2023-06-15 MED ORDER — KETOROLAC TROMETHAMINE 30 MG/ML IJ SOLN
30.0000 mg | Freq: Once | INTRAMUSCULAR | Status: AC
  Administered 2023-06-15: 30 mg via INTRAVENOUS
  Filled 2023-06-15: qty 1

## 2023-06-15 MED ORDER — PREDNISONE 20 MG PO TABS: 60.0000 mg | ORAL_TABLET | Freq: Every day | ORAL | 0 refills | Status: DC

## 2023-06-15 MED ORDER — ACETAMINOPHEN 500 MG PO TABS
1000.0000 mg | ORAL_TABLET | Freq: Once | ORAL | Status: AC | PRN
  Administered 2023-06-15: 1000 mg via ORAL
  Filled 2023-06-15: qty 2

## 2023-06-15 MED ORDER — SODIUM CHLORIDE 0.9 % IV BOLUS (SEPSIS)
1000.0000 mL | Freq: Once | INTRAVENOUS | Status: AC
  Administered 2023-06-15: 1000 mL via INTRAVENOUS

## 2023-06-15 MED ORDER — METHYLPREDNISOLONE SODIUM SUCC 125 MG IJ SOLR
125.0000 mg | Freq: Once | INTRAMUSCULAR | Status: AC
  Administered 2023-06-15: 125 mg via INTRAVENOUS
  Filled 2023-06-15: qty 2

## 2023-06-15 NOTE — ED Triage Notes (Signed)
Pt reports cough and congestion  x 2 weeks with frequent inhaler use. Pt also reports being prescribed amoxicillin and a z-pack. States compliance with these medications. States tonight chest has felt tight despite standing in warm shower. Pt ambulatory to triage. Alert and oriented. Breathing unlabored and speaking in full sentences.

## 2023-06-15 NOTE — ED Provider Notes (Signed)
[facility] Provider Note    Event Date/Time   First MD Initiated Contact with Patient 06/15/23 0021     (approximate)   History   Cough and Chest Pain   HPI  Bridget Maldonado is a 42 y.o. female with history of asthma, hypertension, previous stroke, bipolar disorder who presents to the emergency department with fevers, cough and congestion.  Symptoms ongoing for several weeks but worsened today.  Patient reports 2 weeks ago she was started on amoxicillin and prednisone.  A week ago she was started on azithromycin.  She continues to wheeze.  He had 1 episode of nausea and vomiting.  No nausea currently.  No diarrhea.  Her daughter recently had pneumonia.   History provided by patient.    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   Increased risk of breast cancer 07/2017   IBIS=40%   Kidney infection    Pylonephritis when pregnant   LGSIL on Pap smear of cervix    Migraines    Obese    Psoriatic arthritis (HCC)    Secondary oligomenorrhea 07/16/2017    Past Surgical History:  Procedure Laterality Date   BREAST BIOPSY Left 8 years   CESAREAN SECTION     COLONOSCOPY     COLONOSCOPY W/ BIOPSIES     CYSTOSCOPY N/A 03/04/2022   Procedure: CYSTOSCOPY;  Surgeon: [place] Bing, MD;  Location: [facility] OR;  Service: Gynecology;  Laterality: N/A;   EAR TUBE REMOVAL     ENDOMETRIAL BIOPSY  12/23/2021   ESOPHAGOGASTRODUODENOSCOPY     LAPAROSCOPIC CHOLECYSTECTOMY  6/09   Dr. Lindie Spruce   TOTAL LAPAROSCOPIC HYSTERECTOMY WITH SALPINGECTOMY Bilateral 03/04/2022    Procedure: TOTAL LAPAROSCOPIC HYSTERECTOMY WITH SALPINGECTOMY;  Surgeon: [place] Bing, MD;  Location: [facility] OR;  Service: Gynecology;  Laterality: Bilateral;   TUBAL LIGATION  2017   TYMPANOSTOMY TUBE PLACEMENT      MEDICATIONS:  Prior to Admission medications   Medication Sig Start Date End Date Taking? Authorizing Provider  albuterol (PROVENTIL HFA;VENTOLIN HFA) 108 (90 Base) MCG/ACT inhaler Inhale 2 puffs into the lungs every 6 (six) hours as needed for wheezing or shortness of breath. 04/16/16   Karie Schwalbe, MD  ALPRAZolam Prudy Feeler) 0.25 MG tablet Take 1 tablet (0.25 mg total) by mouth 3 (three) times daily as needed for anxiety. 04/24/23   Karie Schwalbe, MD  aspirin EC 81 MG tablet Take 81 mg by mouth daily. Swallow whole.    [provider]  Atogepant 60 MG TABS Take by mouth. 03/12/23   [provider]  atorvastatin (LIPITOR) 20 MG tablet TAKE ONE TABLET (20 MG TOTAL) BY MOUTH DAILY. 01/09/23   Karie Schwalbe, MD  azithromycin (ZITHROMAX Z-PAK) 250 MG tablet Take 2 tablets (500 mg) on  Day 1,  followed by 1 tablet (250 mg) once daily on Days 2 through 5. 06/10/23   Karie Schwalbe, MD  bacitracin 500 UNIT/GM ointment Apply 1 Application topically 2 (two) times daily. 03/20/23   Robley Fries,  Lauren A, PA-C  benzonatate (TESSALON) 200 MG capsule Take 1 capsule (200 mg total) by mouth 3 (three) times daily as needed for cough. 05/11/23   Karie Schwalbe, MD  butalbital-acetaminophen-caffeine (FIORICET) [phone] MG tablet Take 1 tablet by mouth daily as needed for headache. 12/15/22   Karie Schwalbe, MD  DULoxetine (CYMBALTA) 60 MG capsule Take 1 capsule (60 mg total) by mouth daily. 12/15/22   Karie Schwalbe, MD  HYDROcodone bit-homatropine (HYCODAN) 5-1.5 MG/5ML syrup Take 5 mLs by mouth at bedtime as needed for cough. 06/10/23   Karie Schwalbe, MD  lisinopril-hydrochlorothiazide (ZESTORETIC) 20-12.5 MG tablet Take 1 tablet by mouth daily. 12/30/22   Karie Schwalbe, MD  mometasone (ELOCON) 0.1 % ointment APPLY TWICE DAILY AS NEEDED FOR PSORIASIS 10/15/18   Karie Schwalbe, MD  montelukast (SINGULAIR) 10 MG tablet Take 1 tablet (10 mg total) by mouth at bedtime. 09/17/22   Karie Schwalbe, MD  nortriptyline (PAMELOR) 10 MG capsule Take 10 mg by mouth 2 (two) times daily.    [provider]  ondansetron (ZOFRAN-ODT) 4 MG disintegrating tablet Take 1 tablet (4 mg total) by mouth every 8 (eight) hours as needed. 02/18/23   Jacalyn Lefevre, MD  predniSONE (STERAPRED UNI-PAK 21 TAB) 10 MG (21) TBPK tablet Take by mouth daily. Take 6 tabs by mouth daily  for 1 days, then 5 tabs for 1 days, then 4 tabs for 1 days, then 3 tabs for 1 days, 2 tabs for 1 days, then 1 tab by mouth daily for 1 days 04/08/23   Valinda Hoar, NP  rizatriptan (MAXALT-MLT) 10 MG disintegrating tablet Take 1 tablet (10 mg total) by mouth as needed for migraine. May repeat in 2 hours if needed 02/18/23   Jacalyn Lefevre, MD  traMADol (ULTRAM) 50 MG tablet Take 1 tablet (50 mg total) by mouth 2 (two) times daily as needed. 05/27/23   Karie Schwalbe, MD  traZODone (DESYREL) 50 MG tablet Take 1 tablet by mouth at bedtime. 03/12/23 03/11/24  [provider]    Physical Exam   Triage Vital Signs: ED Triage Vitals  Encounter Vitals Group     BP 06/15/23 0013 (!) 154/109     Systolic BP Percentile --      Diastolic BP Percentile --      Pulse Rate 06/15/23 0011 (!) 104     Resp 06/15/23 0011 20     Temp 06/15/23 0011 (!) 101.5 F (38.6 C)     Temp Source 06/15/23 0011 Oral     SpO2 06/15/23 0011 98 %     Weight 06/15/23 0006 220 lb (99.8 kg)     Height 06/15/23 0006 5\' 4"  (1.626 m)     Head Circumference --      Peak Flow --      Pain Score 06/15/23 0006 6     Pain Loc --      Pain Education --      Exclude from Growth Chart --     Most recent vital signs: Vitals:   06/15/23 0011 06/15/23 0013  BP:  (!) 154/109  Pulse: (!) 104   Resp: 20   Temp:  (!) 101.5 F (38.6 C)   SpO2: 98%     CONSTITUTIONAL: Alert, responds appropriately to questions. Well-appearing; well-nourished HEAD: Normocephalic, atraumatic EYES: Conjunctivae clear, pupils appear equal, sclera nonicteric ENT: normal nose; moist mucous membranes NECK: Supple, normal ROM CARD: Regular and minimally tachycardic; S1 and  S2 appreciated RESP: Normal chest excursion without splinting or tachypnea; breath equal bilaterally.  Patient is wheezing.  No rhonchi or rales.  No hypoxia or respiratory distress. ABD/GI: Non-distended; soft, non-tender, no rebound, no guarding, no peritoneal signs BACK: The back appears normal EXT: Normal ROM in all joints; no deformity noted, no edema, no calf tenderness or calf swelling SKIN: Normal color for age and race; warm; no rash on exposed skin NEURO: Moves all extremities equally, normal speech PSYCH: The patient's mood and manner are appropriate.   ED Results / Procedures / Treatments   LABS: (all labs ordered are listed, but only abnormal results are displayed) Labs Reviewed  CBC - Abnormal; Notable for the following components:      Result Value   RBC 5.12 (*)    All other components within normal limits  COMPREHENSIVE METABOLIC PANEL - Abnormal; Notable for the following components:   Glucose, Bld 109 (*)    Calcium 8.4 (*)    Total Bilirubin 1.4 (*)    All other components within normal limits  RESP PANEL BY RT-PCR (RSV, FLU A&B, COVID)  RVPGX2  TROPONIN I (HIGH SENSITIVITY)     EKG:  EKG Interpretation Date/Time:  Monday June 15 2023 00:06:22 EST Ventricular Rate:  107 PR Interval:  158 QRS Duration:  70 QT Interval:  332 QTC Calculation: 443 R Axis:   75  Text Interpretation: Sinus tachycardia Septal infarct , age undetermined Abnormal ECG When compared with ECG of 24-Feb-2023 10:31, PREVIOUS ECG IS PRESENT Confirmed by Rochele Raring [phone]) on 06/15/2023 12:28:14 AM         RADIOLOGY: My personal  review and interpretation of imaging: Chest x-ray shows bronchitic changes.  I have personally reviewed all radiology reports.   DG Chest 2 View Result Date: 06/15/2023 CLINICAL DATA:  Chest pain, cough, and congestion for 2 weeks. EXAM: CHEST - 2 VIEW COMPARISON:  07/08/2021 FINDINGS: Heart size and pulmonary vascularity are normal. Central peribronchial thickening suggesting bronchitis or airways disease. No consolidation or airspace disease. No pleural effusions. No pneumothorax. Mediastinal contours appear intact. Surgical clips in the right upper quadrant. IMPRESSION: Central peribronchial thickening suggesting bronchitis or airways disease. No focal consolidation. Electronically Signed   By: Burman Nieves M.D.   On: 06/15/2023 00:57     PROCEDURES:  Critical Care performed: No     Procedures    IMPRESSION / MDM / ASSESSMENT AND PLAN / ED COURSE  I reviewed the triage vital signs and the nursing notes.    Patient here with fever, cough, congestion, wheezing.  The patient is on the cardiac monitor to evaluate for evidence of arrhythmia and/or significant heart rate changes.   DIFFERENTIAL DIAGNOSIS (includes but not limited to):   Asthma exacerbation, bronchitis, pneumonia, viral URI   Patient's presentation is most consistent with acute presentation with potential threat to life or bodily function.   PLAN: Will obtain labs, COVID and flu swab, chest x-ray.  EKG nonischemic.  Will give Toradol, Tussionex, DuoNebs, Solu-Medrol for symptomatic relief.   MEDICATIONS GIVEN IN ED: Medications  acetaminophen (TYLENOL) tablet 1,000 mg (1,000 mg Oral Given 06/15/23 0049)  sodium chloride 0.9 % bolus 1,000 mL (1,000 mLs Intravenous New Bag/Given 06/15/23 0100)  ketorolac (TORADOL) 30 MG/ML injection 30 mg (30 mg Intravenous Given 06/15/23 0047)  chlorpheniramine-HYDROcodone (TUSSIONEX) 10-8 MG/5ML suspension 5 mL (5 mLs Oral Given 06/15/23 0046)  ipratropium-albuterol  (DUONEB) 0.5-2.5 (3) MG/3ML nebulizer solution 3 mL (3 mLs Nebulization Given 06/15/23 0058)  methylPREDNISolone  sodium succinate (SOLU-MEDROL) 125 mg/2 mL injection 125 mg (125 mg Intravenous Given 06/15/23 0048)  ipratropium-albuterol (DUONEB) 0.5-2.5 (3) MG/3ML nebulizer solution 3 mL (3 mLs Nebulization Given 06/15/23 0138)     ED COURSE: Patient's labs show no leukocytosis.  Normal troponin.  COVID and flu negative.  RSV negative.  Chest x-ray reviewed and interpreted by myself and the radiologist and shows bronchitic changes but no pneumonia.  Patient reports feeling better.  Wheezing has resolved and now lungs are clear to auscultation with good aeration.  No hypoxia or respiratory distress.  I feel she is safe for discharge.  Will discharge on prednisone burst.  It looks like she was just given a prescription for hydrocodone homatropine cough syrup 5 days ago.  Will have her continue this as prescribed.  No indication for antibiotics today.  Discussed supportive care instructions and return precautions with patient.  She verbalized understanding.   At this time, I do not feel there is any life-threatening condition present. I reviewed all nursing notes, vitals, pertinent previous records.  All lab and urine results, EKGs, imaging ordered have been independently reviewed and interpreted by myself.  I reviewed all available radiology reports from any imaging ordered this visit.  Based on my assessment, I feel the patient is safe to be discharged home without further emergent workup and can continue workup as an outpatient as needed. Discussed all findings, treatment plan as well as usual and customary return precautions.  They verbalize understanding and are comfortable with this plan.  Outpatient follow-up has been provided as needed.  All questions have been answered.   CONSULTS:  none   OUTSIDE RECORDS REVIEWED: Reviewed family medicine visits on 06/01/2023 and 06/10/2023.       FINAL  CLINICAL IMPRESSION(S) / ED DIAGNOSES   Final diagnoses:  Viral URI with cough  Bronchitis  Exacerbation of asthma, unspecified asthma severity, unspecified whether persistent     Rx / DC Orders   ED Discharge Orders          Ordered    predniSONE (DELTASONE) 20 MG tablet  Daily        06/15/23 0209             Note:  This document was prepared using Dragon voice recognition software and may include unintentional dictation errors.   Joye Wesenberg, Layla Maw, DO 06/15/23 [phone]

## 2023-06-15 NOTE — ED Notes (Signed)
Last dose of OTC med for fever was ~2000 and was motrin per pt report.

## 2023-06-15 NOTE — Discharge Instructions (Addendum)
You may alternate Tylenol 1000 mg every 6 hours as needed for pain, fever and Ibuprofen 800 mg every 6-8 hours as needed for pain, fever.  Please take Ibuprofen with food.  Do not take more than 4000 mg of Tylenol (acetaminophen) in a 24 hour period.  Please continue the cough medicine that was prescribed 5 days ago by your PCP as needed.  Please use your albuterol as needed.  Your chest x-ray showed no pneumonia.  Your COVID, flu and RSV swabs are negative.  No indication for antibiotics today.  Please follow-up with your PCP if symptoms are not improving at the end of your steroid prescription.

## 2023-06-15 NOTE — ED Notes (Signed)
Patient transported to X-ray 

## 2023-06-19 ENCOUNTER — Telehealth: Payer: Self-pay

## 2023-06-19 NOTE — Transitions of Care (Post Inpatient/ED Visit) (Signed)
Unable to reach patient by phone and left v/m requesting call back at [phone].       06/19/2023  Name: Bridget Maldonado [facility] MRN: [id number] DOB: 02-24-1981  Today's TOC FU Call Status: Today's TOC FU Call Status:: Unsuccessful Call (1st Attempt) Unsuccessful Call (1st Attempt) Date: 06/19/23  Attempted to reach the patient regarding the most recent Inpatient/ED visit.  Follow Up Plan: Additional outreach attempts will be made to reach the patient to complete the Transitions of Care (Post Inpatient/ED visit) call.   Signature Lewanda Rife, LPN

## 2023-06-22 ENCOUNTER — Other Ambulatory Visit: Payer: Self-pay | Admitting: Internal Medicine

## 2023-06-22 MED ORDER — ALPRAZOLAM 0.25 MG PO TABS: 0.2500 mg | ORAL_TABLET | Freq: Three times a day (TID) | ORAL | 0 refills | Status: DC | PRN

## 2023-06-22 NOTE — Telephone Encounter (Signed)
Last filled 04-24-23 #60 Last OV Acute 06-10-23 Next OV 09-14-23 [facility]

## 2023-06-30 ENCOUNTER — Other Ambulatory Visit: Payer: Self-pay | Admitting: Internal Medicine

## 2023-06-30 MED ORDER — TRAMADOL HCL 50 MG PO TABS: 50.0000 mg | ORAL_TABLET | Freq: Two times a day (BID) | ORAL | 0 refills | Status: DC | PRN

## 2023-07-10 ENCOUNTER — Emergency Department (HOSPITAL_COMMUNITY): Payer: No Typology Code available for payment source

## 2023-07-10 ENCOUNTER — Other Ambulatory Visit: Payer: Self-pay

## 2023-07-10 ENCOUNTER — Emergency Department (HOSPITAL_COMMUNITY)
Admission: EM | Admit: 2023-07-10 | Discharge: 2023-07-10 | Disposition: A | Discharge status: Home / Self Care | Payer: No Typology Code available for payment source | Attending: Emergency Medicine | Admitting: Emergency Medicine

## 2023-07-10 ENCOUNTER — Encounter (HOSPITAL_COMMUNITY): Payer: Self-pay

## 2023-07-10 DIAGNOSIS — R531 Weakness: Secondary | ICD-10-CM | POA: Diagnosis present

## 2023-07-10 DIAGNOSIS — R791 Abnormal coagulation profile: Secondary | ICD-10-CM | POA: Insufficient documentation

## 2023-07-10 DIAGNOSIS — Z7982 Long term (current) use of aspirin: Secondary | ICD-10-CM | POA: Insufficient documentation

## 2023-07-10 LAB — COMPREHENSIVE METABOLIC PANEL
ALT: 22 U/L (ref 0–44)
AST: 20 U/L (ref 15–41)
Albumin: 3.7 g/dL (ref 3.5–5.0)
Alkaline Phosphatase: 103 U/L (ref 38–126)
Anion gap: 9 (ref 5–15)
BUN: 12 mg/dL (ref 6–20)
CO2: 25 mmol/L (ref 22–32)
Calcium: 9.1 mg/dL (ref 8.9–10.3)
Chloride: 104 mmol/L (ref 98–111)
Creatinine, Ser: 0.83 mg/dL (ref 0.44–1.00)
GFR, Estimated: >60 mL/min (ref >60)
Glucose, Bld: 94 mg/dL (ref 70–99)
Potassium: 3.5 mmol/L (ref 3.5–5.1)
Sodium: 138 mmol/L (ref 135–145)
Total Bilirubin: 0.6 mg/dL (ref 0.0–1.2)
Total Protein: 7.3 g/dL (ref 6.5–8.1)

## 2023-07-10 LAB — CBC
HCT: 44.3 % (ref 36.0–46.0)
Hemoglobin: 14.3 g/dL (ref 12.0–15.0)
MCH: 27.4 pg (ref 26.0–34.0)
MCHC: 32.3 g/dL (ref 30.0–36.0)
MCV: 85 fL (ref 80.0–100.0)
Platelets: 291 10*3/uL (ref 150–400)
RBC: 5.21 MIL/uL — ABNORMAL HIGH (ref 3.87–5.11)
RDW: 13.1 % (ref 11.5–15.5)
WBC: 7.4 10*3/uL (ref 4.0–10.5)
nRBC: 0 % (ref 0.0–0.2)

## 2023-07-10 LAB — DIFFERENTIAL
Abs Immature Granulocytes: 0.01 10*3/uL (ref 0.00–0.07)
Basophils Absolute: 0.1 10*3/uL (ref 0.0–0.1)
Basophils Relative: 1 %
Eosinophils Absolute: 0.1 10*3/uL (ref 0.0–0.5)
Eosinophils Relative: 1 %
Immature Granulocytes: 0 %
Lymphocytes Relative: 27 %
Lymphs Abs: 2 10*3/uL (ref 0.7–4.0)
Monocytes Absolute: 0.3 10*3/uL (ref 0.1–1.0)
Monocytes Relative: 4 %
Neutro Abs: 4.9 10*3/uL (ref 1.7–7.7)
Neutrophils Relative %: 67 %

## 2023-07-10 LAB — PROTIME-INR
INR: 1 (ref 0.8–1.2)
Prothrombin Time: 13.6 s (ref 11.4–15.2)

## 2023-07-10 LAB — APTT: aPTT: 29 s (ref 24–36)

## 2023-07-10 LAB — CBG MONITORING, ED: Glucose-Capillary: 89 mg/dL (ref 70–99)

## 2023-07-10 LAB — ETHANOL: Alcohol, Ethyl (B): <10 mg/dL (ref <10)

## 2023-07-10 MED ORDER — DEXAMETHASONE SODIUM PHOSPHATE 10 MG/ML IJ SOLN
10.0000 mg | Freq: Once | INTRAMUSCULAR | Status: AC
  Administered 2023-07-10: 10 mg via INTRAVENOUS
  Filled 2023-07-10: qty 1

## 2023-07-10 MED ORDER — PROCHLORPERAZINE EDISYLATE 10 MG/2ML IJ SOLN
10.0000 mg | Freq: Once | INTRAMUSCULAR | Status: AC
  Administered 2023-07-10: 10 mg via INTRAVENOUS
  Filled 2023-07-10: qty 2

## 2023-07-10 MED ORDER — PROCHLORPERAZINE MALEATE 10 MG PO TABS: 10.0000 mg | ORAL_TABLET | Freq: Two times a day (BID) | ORAL | 0 refills | Status: AC | PRN

## 2023-07-10 MED ORDER — SODIUM CHLORIDE 0.9% FLUSH: 3.0000 mL | Freq: Once | INTRAVENOUS | Status: DC

## 2023-07-10 MED ORDER — SODIUM CHLORIDE 0.9 % IV BOLUS
1000.0000 mL | Freq: Once | INTRAVENOUS | Status: AC
Start: 2023-07-10 — End: 2023-07-10
  Administered 2023-07-10: 1000 mL via INTRAVENOUS

## 2023-07-10 NOTE — Code Documentation (Signed)
Stroke Response Nurse Documentation Code Documentation  Bridget Maldonado is a 43 y.o. female arriving to [facility]  via [facility] on 1/17 with past medical hx of migraine, panic disorder, prediabetes, bipolar. On No antithrombotic. Code stroke was activated by EMS.   Patient from work where she was LKW at 1030, at which time she became weak in all four extremities with slurred speech.    Stroke team at the bedside on patient arrival. Labs drawn and patient cleared for CT by Dr. Jeraldine Loots. Patient to CT with team. NIHSS 15, see documentation for details and code stroke times. Patient with disoriented, bilateral arm weakness, bilateral leg weakness, right decreased sensation, and dysarthria  on exam. The following imaging was completed:  CT Head. Patient is not a candidate for IV Thrombolytic due to stroke not suspected per MD. Patient is not not a candidate for IR due to LVO not suspected per MD.   Care Plan: cancel code stroke   Bedside handoff with ED RN Tori.    Pearlie Oyster  Stroke Response RN

## 2023-07-10 NOTE — ED Triage Notes (Signed)
Pt came in as code stroke from work. LSN 1030. Deficits- bilateral leg and arm weakness/ slurred speech. Hx of stroke, denies blood thinners.

## 2023-07-10 NOTE — ED Notes (Addendum)
Got pt up to the RR. The pt had to use the wheelchair to get to and from the RR. Pt was able to stand and turn she is also a little unsteady. Pt does need at least two people family did help.

## 2023-07-10 NOTE — ED Provider Notes (Signed)
[place] EMERGENCY DEPARTMENT AT [facility] Provider Note   CSN: [id number] Arrival date & time: 07/10/23  1144  An emergency department physician performed an initial assessment on this suspected stroke patient at 1138.  History  Chief Complaint  Patient presents with   Code Stroke    Bridget Maldonado is a 43 y.o. female.  HPI Patient presents as a code stroke.  Patient's last seen was 90 minutes ago.  She developed left-sided weakness.  Reportedly the patient has a prior stroke history.  She arrives via EMS, history is obtained by those individuals.  Patient is not speaking, level 5 caveat.  No hemodynamic instability reported in transport.    Home Medications Prior to Admission medications   Medication Sig Start Date End Date Taking? Authorizing Provider  prochlorperazine (COMPAZINE) 10 MG tablet Take 1 tablet (10 mg total) by mouth 2 (two) times daily as needed for nausea or vomiting. 07/10/23  Yes Gerhard Munch, MD  albuterol (PROVENTIL HFA;VENTOLIN HFA) 108 (90 Base) MCG/ACT inhaler Inhale 2 puffs into the lungs every 6 (six) hours as needed for wheezing or shortness of breath. 04/16/16   Karie Schwalbe, MD  ALPRAZolam Prudy Feeler) 0.25 MG tablet Take 1 tablet (0.25 mg total) by mouth 3 (three) times daily as needed for anxiety. 06/22/23   Tillman Abide I, MD  aspirin EC 81 MG tablet Take 81 mg by mouth daily. Swallow whole.    [provider]  Atogepant 60 MG TABS Take by mouth. 03/12/23   [provider]  atorvastatin (LIPITOR) 20 MG tablet TAKE ONE TABLET (20 MG TOTAL) BY MOUTH DAILY. 01/09/23   Karie Schwalbe, MD  azithromycin (ZITHROMAX Z-PAK) 250 MG tablet Take 2 tablets (500 mg) on  Day 1,  followed by 1 tablet (250 mg) once daily on Days 2 through 5. 06/10/23   Karie Schwalbe, MD  bacitracin 500 UNIT/GM ointment Apply 1 Application topically 2 (two) times daily. 03/20/23   Cameron Ali, PA-C  benzonatate (TESSALON) 200 MG  capsule Take 1 capsule (200 mg total) by mouth 3 (three) times daily as needed for cough. 05/11/23   Karie Schwalbe, MD  butalbital-acetaminophen-caffeine (FIORICET) [id number] MG tablet Take 1 tablet by mouth daily as needed for headache. 12/15/22   Karie Schwalbe, MD  DULoxetine (CYMBALTA) 60 MG capsule Take 1 capsule (60 mg total) by mouth daily. 12/15/22   Karie Schwalbe, MD  lisinopril-hydrochlorothiazide (ZESTORETIC) 20-12.5 MG tablet Take 1 tablet by mouth daily. 12/30/22   Karie Schwalbe, MD  mometasone (ELOCON) 0.1 % ointment APPLY TWICE DAILY AS NEEDED FOR PSORIASIS 10/15/18   Karie Schwalbe, MD  montelukast (SINGULAIR) 10 MG tablet Take 1 tablet (10 mg total) by mouth at bedtime. 09/17/22   Karie Schwalbe, MD  nortriptyline (PAMELOR) 10 MG capsule Take 10 mg by mouth 2 (two) times daily.    [provider]  ondansetron (ZOFRAN-ODT) 4 MG disintegrating tablet Take 1 tablet (4 mg total) by mouth every 8 (eight) hours as needed. 02/18/23   Jacalyn Lefevre, MD  predniSONE (DELTASONE) 20 MG tablet Take 3 tablets (60 mg total) by mouth daily. 06/15/23   Ward, Layla Maw, DO  rizatriptan (MAXALT-MLT) 10 MG disintegrating tablet Take 1 tablet (10 mg total) by mouth as needed for migraine. May repeat in 2 hours if needed 02/18/23   Jacalyn Lefevre, MD  traMADol (ULTRAM) 50 MG tablet Take 1 tablet (50 mg total) by mouth 2 (two) times  daily as needed. 06/30/23   Karie Schwalbe, MD  traZODone (DESYREL) 50 MG tablet Take 1 tablet by mouth at bedtime. 03/12/23 03/11/24  [provider]      Allergies    Sulfa antibiotics, Cipro [ciprofloxacin hcl], and Effexor [venlafaxine hcl]    Review of Systems   Review of Systems  Physical Exam Updated Vital Signs BP (!) 161/86   Pulse 78   Temp 97.8 F (36.6 C) (Oral)   Resp 17   Ht 5\' 4"  (1.626 m)   Wt 105 kg   LMP 10/21/2021 (Approximate) Comment: Urine preg negatvie 03/04/22  SpO2 100%   BMI 39.73 kg/m  Physical  Exam Vitals and nursing note reviewed.  Constitutional:      General: She is not in acute distress.    Appearance: She is well-developed. She is obese.  HENT:     Head: Normocephalic and atraumatic.  Eyes:     Conjunctiva/sclera: Conjunctivae normal.  Cardiovascular:     Rate and Rhythm: Normal rate and regular rhythm.  Pulmonary:     Effort: Pulmonary effort is normal. No respiratory distress.     Breath sounds: Normal breath sounds. No stridor.  Abdominal:     General: There is no distension.  Skin:    General: Skin is warm and dry.  Neurological:     Mental Status: She is alert and oriented to person, place, and time.     Cranial Nerves: No cranial nerve deficit.     Comments: Patient has slight hesitancy, but does track across midline, holds her arms somewhat against gravity, moving them seemingly purposefully rather than letting drop to her face.  Psychiatric:        Mood and Affect: Mood normal.     ED Results / Procedures / Treatments   Labs (all labs ordered are listed, but only abnormal results are displayed) Labs Reviewed  CBC - Abnormal; Notable for the following components:      Result Value   RBC 5.21 (*)    All other components within normal limits  PROTIME-INR  APTT  DIFFERENTIAL  COMPREHENSIVE METABOLIC PANEL  ETHANOL  CBG MONITORING, ED  I-STAT CHEM 8, ED    EKG None  Radiology CT HEAD CODE STROKE WO CONTRAST Result Date: 07/10/2023 CLINICAL DATA:  Code stroke. Neuro deficit, acute, stroke suspected. Slurred speech and weakness. Last normal 1030 hours EXAM: CT HEAD WITHOUT CONTRAST TECHNIQUE: Contiguous axial images were obtained from the base of the skull through the vertex without intravenous contrast. RADIATION DOSE REDUCTION: This exam was performed according to the departmental dose-optimization program which includes automated exposure control, adjustment of the mA and/or kV according to patient size and/or use of iterative reconstruction  technique. COMPARISON:  02/24/2023.  MRI 02/18/2023. FINDINGS: Brain: No acute finding. The brainstem and cerebellum are normal. Old small cortical and subcortical infarction at the left medial parietal vertex. No sign of acute infarction, mass, hemorrhage, hydrocephalus or extra-axial collection. No abnormal vascular finding. Vascular: Negative Skull: Negative Sinuses/Orbits: Clear/normal Other: None ASPECTS (Alberta Stroke Program Early CT Score) - Ganglionic level infarction (caudate, lentiform nuclei, internal capsule, insula, M1-M3 cortex): 7 - Supraganglionic infarction (M4-M6 cortex): 3 Total score (0-10 with 10 being normal): 10 IMPRESSION: 1. No acute CT finding. Old small cortical and subcortical infarction at the left medial parietal vertex. 2. Aspects is 10. 3. These results were communicated to Dr. Amada Jupiter at 11:51 am on 07/10/2023 by text page via the [facility] messaging system. Electronically Signed  By: Paulina Fusi M.D.   On: 07/10/2023 11:52    Procedures Procedures    Medications Ordered in ED Medications  sodium chloride flush (NS) 0.9 % injection 3 mL (3 mLs Intravenous Not Given 07/10/23 1201)  sodium chloride 0.9 % bolus 1,000 mL (0 mLs Intravenous Stopped 07/10/23 1413)  prochlorperazine (COMPAZINE) injection 10 mg (10 mg Intravenous Given 07/10/23 1250)  dexamethasone (DECADRON) injection 10 mg (10 mg Intravenous Given 07/10/23 1249)    ED Course/ Medical Decision Making/ A&P                                 Medical Decision Making Adult female presents with acute onset left-sided weakness.  Concern for stroke.  Chart review notable for 1 prior episode last year described as functional, with reassuring eval.  TIA, electrolytes, also considered.  Differential including stroke, strokelike episode, TIA, complex migraine all considered. Cardiac 80 sinus normal Pulse ox 100% room air normal After additional invention the patient's parents arrived at bedside, described today's  episode, seemingly similar to that from last year.   Amount and/or Complexity of Data Reviewed Independent Historian: parent and EMS External Data Reviewed: notes. Labs: ordered. Decision-making details documented in ED Course. Radiology: ordered and independent interpretation performed. Decision-making details documented in ED Course. ECG/medicine tests: ordered and independent interpretation performed. Decision-making details documented in ED Course.  Risk Prescription drug management. Decision regarding hospitalization. Diagnosis or treatment significantly limited by social determinants of health.   2:48 PM Patient in no distress sitting upright states that she is better to go.  She has had resolution of all symptoms.  We discussed possibilities for her presentation including likelihood of complex migraine, less likely stroke given reassuring evaluation here, consideration by neurology colleagues. With unremarkable labs, vitals, patient will follow-up with her primary care physician.        Final Clinical Impression(s) / ED Diagnoses Final diagnoses:  Left-sided weakness    Rx / DC Orders ED Discharge Orders          Ordered    prochlorperazine (COMPAZINE) 10 MG tablet  2 times daily PRN        07/10/23 1448              Gerhard Munch, MD 07/10/23 1448

## 2023-07-10 NOTE — Consult Note (Signed)
NEUROLOGY CONSULT NOTE   Date of service: July 10, 2023 Patient Name: Bridget Maldonado MRN:  [id number] DOB:  December 17, 1980 Chief Complaint: "CODE STROKE" Requesting Provider: Gerhard Munch, MD  History of Present Illness  Bridget Maldonado is a 43 y.o. female with past medical history of  previous stroke, anxiety, bipolar disorder, panic disorder, chronic headaches, migraines, hypertension, obesity, psoriatic arthritis, asthma, sleep difficulties  who was brought in by EMS as an activated code stroke due to slurred speech and weakness.  On exam at ED bridge, patient does respond and follow commands with no verbal output, weakness and drift present in all extremities with some purposeful movement during drift to both arms.  Emergent CTH shows no acute abnormality. No LVO suspected based on presentation of symptoms.  Based on presentation and low suspicions for stroke, CODE STROKE was cancelled.   Per chart review, patient sees outpatient neurology at [facility] for history of headache disorder, stroke-like activity, anxiety and sleep difficulty.  Her last appointment with neurology was October 2024 where she reported significant improvement in migraines, no neurological deficits were noted.  She was admitted to the hospital in June 2024 with left-sided weakness, left facial numbness and aphasia.  Symptoms did completely resolve. 12/06/22  MRI brain at this time showed punctate focus of restricted diffusion left parietal lobe, which did not correlate to CT.  MRI brain repeated 8/28 showed no abnormality.  LKW: 1030 Modified rankin score: 0-Completely asymptomatic and back to baseline post- stroke IV Thrombolysis: No, low NIH EVT: No, no LVO suspected  NIHSS components Score: Comment  1a Level of Conscious 0[x]  1[]  2[]  3[]      1b LOC Questions 0[]  1[]  2[x]       1c LOC Commands 0[x]  1[]  2[]       2 Best Gaze 0[x]  1[]  2[]       3 Visual 0[x]  1[]  2[]  3[]      4 Facial Palsy 0[x]  1[]  2[]  3[]       5a Motor Arm - left 0[]  1[]  2[]  3[x]  4[]  UN[]    5b Motor Arm - Right 0[]  1[]  2[]  3[x]  4[]  UN[]    6a Motor Leg - Left 0[]  1[]  2[]  3[x]  4[]  UN[]    6b Motor Leg - Right 0[]  1[]  2[]  3[x]  4[]  UN[]    7 Limb Ataxia 0[]  1[]  2[]  3[]  UN[x]     8 Sensory 0[x]  1[]  2[]  UN[]      9 Best Language 0[]  1[]  2[]  3[x]      10 Dysarthria 0[]  1[]  2[]  UN[x]      11 Extinct. and Inattention 0[x]  1[]  2[]       TOTAL:   17      ROS   Unable to ascertain due to no verbal response from patient.   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   Increased risk of breast cancer 07/2017   IBIS=40%   Kidney infection    Pylonephritis when pregnant   LGSIL on Pap smear of cervix    Migraines    Obese  Psoriatic arthritis (HCC)    Secondary oligomenorrhea 07/16/2017    Past Surgical History:  Procedure Laterality Date   BREAST BIOPSY Left 8 years   CESAREAN SECTION     COLONOSCOPY     COLONOSCOPY W/ BIOPSIES     CYSTOSCOPY N/A 03/04/2022   Procedure: CYSTOSCOPY;  Surgeon: Buford Bing, MD;  Location: [facility] OR;  Service: Gynecology;  Laterality: N/A;   EAR TUBE REMOVAL     ENDOMETRIAL BIOPSY  12/23/2021   ESOPHAGOGASTRODUODENOSCOPY     LAPAROSCOPIC CHOLECYSTECTOMY  6/09   Dr. Lindie Spruce   TOTAL LAPAROSCOPIC HYSTERECTOMY WITH SALPINGECTOMY Bilateral 03/04/2022   Procedure: TOTAL LAPAROSCOPIC HYSTERECTOMY WITH SALPINGECTOMY;  Surgeon: Cuero Bing, MD;  Location: [facility] OR;  Service: Gynecology;  Laterality: Bilateral;   TUBAL LIGATION  2017   TYMPANOSTOMY TUBE PLACEMENT      Family  History: Family History  Problem Relation Age of Onset   Colon polyps Maternal Grandfather    Parkinson's disease Maternal Grandfather    Breast cancer Mother 85   Rheum arthritis Mother    Irritable bowel syndrome Mother    Asthma Father    Anxiety disorder Father        Severe, possible bipolar   Irritable bowel syndrome Maternal Grandmother    Diabetes Other        mat. great uncles   Alzheimer's disease Other        Dad's side   Colon cancer Other        mat GGF    Social History  reports that she has never smoked. She has been exposed to tobacco smoke. She has never used smokeless tobacco. She reports that she does not drink alcohol and does not use drugs.  Allergies  Allergen Reactions   Sulfa Antibiotics Nausea And Vomiting   Cipro [Ciprofloxacin Hcl] Nausea And Vomiting   Effexor [Venlafaxine Hcl] Other (See Comments)    Excessive sleeping, slept for 2 days    Medications   Current Facility-Administered Medications:    sodium chloride flush (NS) 0.9 % injection 3 mL, 3 mL, Intravenous, Once, Gerhard Munch, MD  Current Outpatient Medications:    albuterol (PROVENTIL HFA;VENTOLIN HFA) 108 (90 Base) MCG/ACT inhaler, Inhale 2 puffs into the lungs every 6 (six) hours as needed for wheezing or shortness of breath., Disp: 1 Inhaler, Rfl: 1   ALPRAZolam (XANAX) 0.25 MG tablet, Take 1 tablet (0.25 mg total) by mouth 3 (three) times daily as needed for anxiety., Disp: 60 tablet, Rfl: 0   aspirin EC 81 MG tablet, Take 81 mg by mouth daily. Swallow whole., Disp: , Rfl:    Atogepant 60 MG TABS, Take by mouth., Disp: , Rfl:    atorvastatin (LIPITOR) 20 MG tablet, TAKE ONE TABLET (20 MG TOTAL) BY MOUTH DAILY., Disp: 30 tablet, Rfl: 11   azithromycin (ZITHROMAX Z-PAK) 250 MG tablet, Take 2 tablets (500 mg) on  Day 1,  followed by 1 tablet (250 mg) once daily on Days 2 through 5., Disp: 6 each, Rfl: 0   bacitracin 500 UNIT/GM ointment, Apply 1 Application topically 2 (two) times  daily., Disp: 15 g, Rfl: 3   benzonatate (TESSALON) 200 MG capsule, Take 1 capsule (200 mg total) by mouth 3 (three) times daily as needed for cough., Disp: 60 capsule, Rfl: 0   butalbital-acetaminophen-caffeine (FIORICET) 50-325-40 MG tablet, Take 1 tablet by mouth daily as needed for headache., Disp: 14 tablet, Rfl: 0   DULoxetine (CYMBALTA) 60 MG capsule, Take 1 capsule (60 mg total)  by mouth daily., Disp: 30 capsule, Rfl: 3   lisinopril-hydrochlorothiazide (ZESTORETIC) 20-12.5 MG tablet, Take 1 tablet by mouth daily., Disp: 90 tablet, Rfl: 3   mometasone (ELOCON) 0.1 % ointment, APPLY TWICE DAILY AS NEEDED FOR PSORIASIS, Disp: 45 g, Rfl: 1   montelukast (SINGULAIR) 10 MG tablet, Take 1 tablet (10 mg total) by mouth at bedtime., Disp: 90 tablet, Rfl: 3   nortriptyline (PAMELOR) 10 MG capsule, Take 10 mg by mouth 2 (two) times daily., Disp: , Rfl:    ondansetron (ZOFRAN-ODT) 4 MG disintegrating tablet, Take 1 tablet (4 mg total) by mouth every 8 (eight) hours as needed., Disp: 20 tablet, Rfl: 0   predniSONE (DELTASONE) 20 MG tablet, Take 3 tablets (60 mg total) by mouth daily., Disp: 15 tablet, Rfl: 0   rizatriptan (MAXALT-MLT) 10 MG disintegrating tablet, Take 1 tablet (10 mg total) by mouth as needed for migraine. May repeat in 2 hours if needed, Disp: 10 tablet, Rfl: 0   traMADol (ULTRAM) 50 MG tablet, Take 1 tablet (50 mg total) by mouth 2 (two) times daily as needed., Disp: 30 tablet, Rfl: 0   traZODone (DESYREL) 50 MG tablet, Take 1 tablet by mouth at bedtime., Disp: , Rfl:   Vitals   Vitals:   07/10/23 1100 07/10/23 1203 07/10/23 1204  BP:  (!) 183/109   Pulse:  83   Resp:  10   Temp:  98.4 F (36.9 C)   TempSrc:  Oral   SpO2:  98%   Weight: 105 kg    Height: 5\' 4"  (1.626 m)  5\' 4"  (1.626 m)    Body mass index is 39.73 kg/m.  Physical Exam   Constitutional: Appears well-developed and well-nourished.  Eyes: No scleral injection.  HENT: No OP obstruction.  Head:  Normocephalic.  Cardiovascular: Normal rate and regular rhythm.  Respiratory: Effort normal, non-labored breathing.  GI: Soft.  No distension. There is no tenderness.  Skin: WDI.   Neurologic Examination   Neuro: Mental Status: Patient is awake, alert. She does not respond verbally but does follow commands.  Cranial Nerves: PERRL, blink to threat present bilaterally, patient does not have a gaze preference and is able to track examiner. No facial palsy is present. Patient not able to participate/does not answer sensation question. She does not stick out tongue or shrug shoulders on command. Hearing subjectively intact to examiner's voice.  Motor: Tone is normal. Bulk is normal. Patient has strong drift and weakness present in all 4 extremities. However, she does have resistance against passive movement. She is able to purposefully move arms away from face with drift while arms are lifted above head and are drifting, with some antigravity movement.   Sensory: Withdraws in all extremities Cerebellar: UTA   Labs/Imaging/Neurodiagnostic studies   CBC: No results for input(s): "WBC", "NEUTROABS", "HGB", "HCT", "MCV", "PLT" in the last 168 hours. Basic Metabolic Panel:  Lab Results  Component Value Date   NA 136 06/15/2023   K 4.0 06/15/2023   CO2 23 06/15/2023   GLUCOSE 109 (H) 06/15/2023   BUN 12 06/15/2023   CREATININE 0.73 06/15/2023   CALCIUM 8.4 (L) 06/15/2023   GFRNONAA >60 06/15/2023   GFRAA >60 10/13/2019   Lipid Panel:  Lab Results  Component Value Date   LDLCALC 131 (H) 12/05/2022   HgbA1c:  Lab Results  Component Value Date   HGBA1C 4.9 12/05/2022   Urine Drug Screen:     Component Value Date/Time   LABOPIA POSITIVE (A) 02/24/2023 1009  LABOPIA NONE DETECTED 02/18/2023 0847   COCAINSCRNUR NONE DETECTED 02/24/2023 1009   LABBENZ POSITIVE (A) 02/24/2023 1009   LABBENZ POSITIVE (A) 02/18/2023 0847   AMPHETMU NONE DETECTED 02/24/2023 1009   AMPHETMU NONE  DETECTED 02/18/2023 0847   THCU NONE DETECTED 02/24/2023 1009   THCU NONE DETECTED 02/18/2023 0847   LABBARB POSITIVE (A) 02/24/2023 1009   LABBARB NONE DETECTED 02/18/2023 0847    Alcohol Level     Component Value Date/Time   ETH <10 02/24/2023 1009   INR  Lab Results  Component Value Date   INR 1.0 02/24/2023   APTT  Lab Results  Component Value Date   APTT 27 02/24/2023   AED levels: No results found for: "PHENYTOIN", "ZONISAMIDE", "LAMOTRIGINE", "LEVETIRACETA"  CT Head without contrast(Personally reviewed): - No acute finding - old small cortical and subcortical infarction at left medial parietal vertex - ASPECTS 10   MRI Brain(Personally reviewed): PENDING   ASSESSMENT   AMELIAROSE SHARK is a 43 y.o. female with past medical history of anxiety, bipolar disorder, chronic headaches, migraines, hypertension, obesity, psoriatic arthritis, asthma who was brought in by EMS as an activated code stroke due to slurred speech and weakness. On exam at ED bridge, patient does respond and follow commands with no verbal output, weakness and drift present in all extremities with some purposeful movement during drift to both arms.  Emergent CTH shows no acute abnormality. No LVO suspected based on presentation of symptoms.   Based on presentation and low suspicions for stroke, CODE STROKE was cancelled.  Patient has history of headache disorder with stroke-like symptoms along with functional overlay. No acute stroke seen on MRI.   Impression: Anxiety, Complicated Migraine with FND overlay.    RECOMMENDATIONS   - STAT CTH with acute neuro change - BP goal normotensive, avoid hypotension  - Could consider migraine cocktail for patient as she has had migraines in the past which presented with stroke-like symptoms.   ______________________________________________________________________   Pt seen by Neuro NP/APP and later by MD. Note/plan to be edited by MD as needed.    Lynnae January, DNP, AGACNP-BC [facility] Please use AMION for contact information & EPIC for messaging.  I have seen the patient and reviewed the above note.  She has had multiple presentations in the past with negative workups.  She has multiple findings on exam today which are not consistent with organic disease.  At this time, I feel that the likelihood of stroke is exceedingly small that we could consider treating as complicated migraine to see if her symptoms improve.  Neurology will continue to be available as needed.  Ritta Slot, MD [facility] [phone]  If 7pm- 7am, please page neurology on call as listed in AMION.

## 2023-07-10 NOTE — Discharge Instructions (Addendum)
Please be sure to follow-up with your physician.  Return here for concerning changes in your condition. 

## 2023-07-30 ENCOUNTER — Telehealth: Payer: No Typology Code available for payment source | Admitting: Nurse Practitioner

## 2023-07-30 ENCOUNTER — Ambulatory Visit: Payer: Self-pay | Admitting: Internal Medicine

## 2023-07-30 ENCOUNTER — Encounter: Payer: Self-pay | Admitting: Nurse Practitioner

## 2023-07-30 VITALS — Temp 101.0°F

## 2023-07-30 DIAGNOSIS — J101 Influenza due to other identified influenza virus with other respiratory manifestations: Secondary | ICD-10-CM | POA: Diagnosis not present

## 2023-07-30 MED ORDER — HYDROCOD POLI-CHLORPHE POLI ER 10-8 MG/5ML PO SUER: 5.0000 mL | Freq: Two times a day (BID) | ORAL | 0 refills | Status: DC | PRN

## 2023-07-30 MED ORDER — OSELTAMIVIR PHOSPHATE 75 MG PO CAPS: 75.0000 mg | ORAL_CAPSULE | Freq: Two times a day (BID) | ORAL | 0 refills | Status: DC

## 2023-07-30 NOTE — Telephone Encounter (Signed)
  Chief Complaint: tested positive for flu A Symptoms: cough, scratchy throat, fever Frequency: yesterday 1500 Pertinent Negatives: Patient denies SOB Disposition: [] ED /[] Urgent Care (no appt availability in office) / [x] Appointment(In office/virtual)/ []  [facility]/ [] Home Care/ [] Refused Recommended Disposition /[] [place]/ []  Follow-up with PCP  Additional Notes: Pt tested positive for flu A, states she has a cough and a sore throat. Pt hx of chronic medical conditions. Pt sched for VV as she would like the antiviral if possible.   Copied from CRM [id number]. Topic: Clinical - Red Word Triage >> Jul 30, 2023  8:33 AM Russell PARAS wrote: Red Word that prompted transfer to Nurse Triage: Tested positive for Flu A this morning  Fever, highest of 102  Severe cough, nasal congestion, headache,  No chest or shortness of breath. Reason for Disposition  Patient is HIGH RISK (e.g., age > 64 years, pregnant, HIV+, or chronic medical condition)  Answer Assessment - Initial Assessment Questions 1. WORST SYMPTOM: What is your worst symptom? (e.g., cough, runny nose, muscle aches, headache, sore throat, fever)      HA, cough 2. ONSET: When did your flu symptoms start?      Yesterday-1500 3. COUGH: How bad is the cough?       Pretty bad 4. RESPIRATORY DISTRESS: Describe your breathing.      denies 5. FEVER: Do you have a fever? If Yes, ask: What is your temperature, how was it measured, and when did it start?     102, just took some OTC, so should be going down 7. FLU VACCINE: Did you get a flu shot this year?     denies 8. HIGH RISK DISEASE: Do you have any chronic medical problems? (e.g., heart or lung disease, asthma, weak immune system, or other HIGH RISK conditions)     Psoriatic arthritis, asthma 9. PREGNANCY: Is there any chance you are pregnant? When was your last menstrual period?     denies 10. OTHER SYMPTOMS: Do you have any other  symptoms?  (e.g., runny nose, muscle aches, headache, sore throat)       Sore throat,  Protocols used: Influenza - [facility]

## 2023-07-30 NOTE — Progress Notes (Signed)
 [facility] PRIMARY CARE [facility] [address] [place] [address] Dept: [phone] Dept Fax: [phone]  Virtual Video Visit  I connected with Bridget Maldonado on 07/30/23 at  1:00 PM EST by a video enabled telemedicine application and verified that I am speaking with the correct person using two identifiers.  Location patient: Home Location provider: Clinic Persons participating in the virtual visit: Patient; Tinnie Harada, NP; Laymon Gladis Sharps, CMA  I discussed the limitations of evaluation and management by telemedicine and the availability of in person appointments. The patient expressed understanding and agreed to proceed.  Chief Complaint  Patient presents with   Influenza    Cough, body aches, fever, positive home flu A test    SUBJECTIVE:  HPI: Bridget Maldonado is a 43 y.o. female who presents with cough, body aches, fever, and positive home flu A test. Symptoms started yesterday.   UPPER RESPIRATORY TRACT INFECTION  Fever: yes, body aches, 102 temp Cough: yes Shortness of breath: no Wheezing: no Chest pain: no Chest tightness: no Chest congestion: no Nasal congestion: yes Runny nose: yes Post nasal drip: no Sneezing: yes Sore throat: yes Swollen glands: no Sinus pressure: yes Headache: yes Face pain: no Toothache: no Ear pain: yes bilateral Ear pressure: yes bilateral Eyes red/itching:no Eye drainage/crusting: no  Vomiting: no Rash: no Fatigue: yes Sick contacts: yes - coworker Strep contacts: no  Context: worse Recurrent sinusitis: no Relief with OTC cold/cough medications: no  Treatments attempted: hot steam, inhaler, aleve   Patient Active Problem List   Diagnosis Date Noted   Persistent cough 06/10/2023   Rabies exposure 03/27/2023   History of CVA (cerebrovascular accident) 12/15/2022   Acute intractable headache 12/15/2022   Obesity (BMI 30-39.9) 12/06/2022   Hypokalemia 12/06/2022    Prediabetes 09/17/2022   Preventative health care 06/13/2020   Bipolar disease, chronic (HCC) 07/27/2018   Family history of colon cancer 07/27/2017   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   History of cervical dysplasia 07/16/2017   Family history of breast cancer 07/16/2017   Psoriatic arthritis (HCC) 10/28/2016   Chronic low back pain 10/28/2016   Essential hypertension 10/18/2014   Arthropathia 11/24/2013   Obstipation 04/12/2013   Migraine without intractability 09/08/2012   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 Kimble   TOTAL LAPAROSCOPIC HYSTERECTOMY WITH SALPINGECTOMY Bilateral 03/04/2022   Procedure: TOTAL LAPAROSCOPIC HYSTERECTOMY WITH SALPINGECTOMY;  Surgeon: Izell Harari, MD;  Location: [facility] OR;  Service: Gynecology;  Laterality: Bilateral;   TUBAL LIGATION  2017   TYMPANOSTOMY TUBE PLACEMENT      Family History  Problem Relation Age of Onset   Colon polyps Maternal Grandfather    Parkinson's disease Maternal Grandfather    Breast cancer Mother 8   Rheum arthritis Mother    Irritable bowel syndrome Mother    Asthma Father    Anxiety disorder Father  Severe, possible bipolar   Irritable bowel syndrome Maternal Grandmother    Diabetes Other        mat. great uncles   Alzheimer's disease Other        Dad's side   Colon cancer Other        mat GGF    Social History   Tobacco Use   Smoking  status: Never    Passive exposure: Past   Smokeless tobacco: Never  Vaping Use   Vaping status: Never Used  Substance Use Topics   Alcohol use: No    Alcohol/week: 0.0 standard drinks of alcohol   Drug use: No     Current Outpatient Medications:    chlorpheniramine -HYDROcodone  (TUSSIONEX) 10-8 MG/5ML, Take 5 mLs by mouth every 12 (twelve) hours as needed for cough., Disp: 70 mL, Rfl: 0   oseltamivir  (TAMIFLU ) 75 MG capsule, Take 1 capsule (75 mg total) by mouth 2 (two) times daily., Disp: 10 capsule, Rfl: 0   albuterol  (PROVENTIL  HFA;VENTOLIN  HFA) 108 (90 Base) MCG/ACT inhaler, Inhale 2 puffs into the lungs every 6 (six) hours as needed for wheezing or shortness of breath., Disp: 1 Inhaler, Rfl: 1   ALPRAZolam  (XANAX ) 0.25 MG tablet, Take 1 tablet (0.25 mg total) by mouth 3 (three) times daily as needed for anxiety., Disp: 60 tablet, Rfl: 0   aspirin  EC 81 MG tablet, Take 81 mg by mouth daily. Swallow whole., Disp: , Rfl:    Atogepant 60 MG TABS, Take by mouth., Disp: , Rfl:    atorvastatin  (LIPITOR) 20 MG tablet, TAKE ONE TABLET (20 MG TOTAL) BY MOUTH DAILY., Disp: 30 tablet, Rfl: 11   azithromycin  (ZITHROMAX  Z-PAK) 250 MG tablet, Take 2 tablets (500 mg) on  Day 1,  followed by 1 tablet (250 mg) once daily on Days 2 through 5., Disp: 6 each, Rfl: 0   bacitracin  500 UNIT/GM ointment, Apply 1 Application topically 2 (two) times daily., Disp: 15 g, Rfl: 3   benzonatate  (TESSALON ) 200 MG capsule, Take 1 capsule (200 mg total) by mouth 3 (three) times daily as needed for cough., Disp: 60 capsule, Rfl: 0   butalbital -acetaminophen -caffeine  (FIORICET ) 50-325-40 MG tablet, Take 1 tablet by mouth daily as needed for headache., Disp: 14 tablet, Rfl: 0   DULoxetine  (CYMBALTA ) 60 MG capsule, Take 1 capsule (60 mg total) by mouth daily., Disp: 30 capsule, Rfl: 3   lisinopril -hydrochlorothiazide  (ZESTORETIC ) 20-12.5 MG tablet, Take 1 tablet by mouth daily., Disp: 90 tablet, Rfl: 3   mometasone  (ELOCON )  0.1 % ointment, APPLY TWICE DAILY AS NEEDED FOR PSORIASIS, Disp: 45 g, Rfl: 1   montelukast  (SINGULAIR ) 10 MG tablet, Take 1 tablet (10 mg total) by mouth at bedtime., Disp: 90 tablet, Rfl: 3   nortriptyline (PAMELOR) 10 MG capsule, Take 10 mg by mouth 2 (two) times daily., Disp: , Rfl:    ondansetron  (ZOFRAN -ODT) 4 MG disintegrating tablet, Take 1 tablet (4 mg total) by mouth every 8 (eight) hours as needed., Disp: 20 tablet, Rfl: 0   predniSONE  (DELTASONE ) 20 MG tablet, Take 3 tablets (60 mg total) by mouth daily., Disp: 15 tablet, Rfl: 0   prochlorperazine  (COMPAZINE ) 10 MG tablet, Take 1 tablet (10 mg total) by mouth 2 (two) times daily as needed for nausea or vomiting., Disp: 20 tablet, Rfl: 0   rizatriptan  (MAXALT -MLT) 10 MG disintegrating tablet, Take 1 tablet (10 mg total) by mouth as needed for migraine. May repeat in 2 hours if needed, Disp: 10 tablet, Rfl: 0  traMADol  (ULTRAM ) 50 MG tablet, Take 1 tablet (50 mg total) by mouth 2 (two) times daily as needed., Disp: 30 tablet, Rfl: 0   traZODone  (DESYREL ) 50 MG tablet, Take 1 tablet by mouth at bedtime., Disp: , Rfl:   Allergies  Allergen Reactions   Sulfa  Antibiotics Nausea And Vomiting   Cipro [Ciprofloxacin Hcl] Nausea And Vomiting   Effexor [Venlafaxine Hcl] Other (See Comments)    Excessive sleeping, slept for 2 days    ROS: See pertinent positives and negatives per HPI.  OBSERVATIONS/OBJECTIVE:  VITALS per patient if applicable: Today's Vitals   07/30/23 1308  Temp: (!) 101 F (38.3 C)  TempSrc: Oral   There is no height or weight on file to calculate BMI.    GENERAL: Alert and oriented. Laying in bed, appears sick, and in no acute distress.  HEENT: Atraumatic. Conjunctiva clear. No obvious abnormalities on inspection of external nose and ears.  NECK: Normal movements of the head and neck.  LUNGS: On inspection, no signs of respiratory distress. Breathing rate appears normal. No obvious gross SOB, gasping or  wheezing, and no conversational dyspnea. Coughing throughout visit  CV: No obvious cyanosis.  MS: Moves all visible extremities without noticeable abnormality.  PSYCH/NEURO: Pleasant and cooperative. No obvious depression or anxiety. Speech and thought processing grossly intact.  ASSESSMENT AND PLAN:  Problem List Items Addressed This Visit   None Visit Diagnoses       Influenza A    -  Primary   Start tamiflu  75mg  BID x 5 days. Encourage fluids, rest. Tussionex prn cough. PDMP reviewed. F/U if not improving.   Relevant Medications   oseltamivir  (TAMIFLU ) 75 MG capsule        I discussed the assessment and treatment plan with the patient. The patient was provided an opportunity to ask questions and all were answered. The patient agreed with the plan and demonstrated an understanding of the instructions.   The patient was advised to call back or seek an in-person evaluation if the symptoms worsen or if the condition fails to improve as anticipated.   Tinnie DELENA Harada, NP

## 2023-07-30 NOTE — Patient Instructions (Signed)
 It was great to see you!  Start tamiflu  twice a day for 5 days with food  Continue drinking plenty of fluids and getting rest  Start tussionex twice a day as needed for cough.   You are contagious until you have been fever free for 48 hours.   Let's follow-up if your symptoms worsen or don't improve.   Take care,  Tinnie Harada, NP

## 2023-07-30 NOTE — Telephone Encounter (Signed)
 Has virtual visit today

## 2023-08-03 ENCOUNTER — Ambulatory Visit: Payer: Self-pay | Admitting: Internal Medicine

## 2023-08-03 NOTE — Telephone Encounter (Signed)
 Patient has appt tomorrow 08/04/23 with [place]

## 2023-08-03 NOTE — Telephone Encounter (Signed)
 Copied from CRM [id number]. Topic: Clinical - Red Word Triage >> Aug 03, 2023  1:36 PM Margarette Shawl wrote: Red Word that prompted transfer to Nurse Triage:   Tested positive for the flu on Thurs 02/ Taking Tamiflu  since then.  Taking Aleve.  Has low grade fever, highest temp was 103 last week.  Progressively worse cough. Hearing crackles in lungs.  Shortness of breath when walking.  Was having more chest muscle pain from coughing.   Chief Complaint: Flu Symptoms: Cough, shortness of breath, crackles when breathing, headache  Frequency: Constant  Disposition: [] ED /[x] Urgent Care (no appt availability in office) / [] Appointment(In office/virtual)/ []  [facility]/ [] Home Care/ [] Refused Recommended Disposition /[] [facility]/ []  Follow-up with PCP Additional Notes: Patient reports she has been sick with the flu since last week. She states that she has been taking Tamiflu  but states she still having symptoms. She reports a history of asthma and states she has been having some shortness of breath with walking and noticed crackles when breathing. Patient states she would like to be seen today for an x-ray. Patient unable to get to an office with imaging and availability today and will instead go to urgent care.      Reason for Disposition  Patient is HIGH RISK (e.g., age > 64 years, pregnant, HIV+, or chronic medical condition)  Answer Assessment - Initial Assessment Questions 1. WORST SYMPTOM: "What is your worst symptom?" (e.g., cough, runny nose, muscle aches, headache, sore throat, fever)      Cough  2. ONSET: "When did your flu symptoms start?"      1 week ago  3. COUGH: "How bad is the cough?"       Moderate 4. RESPIRATORY DISTRESS: "Describe your breathing."      Shortness of breath when walking, crackles with breathing   5. FEVER: "Do you have a fever?" If Yes, ask: "What is your temperature, how was it measured, and when did it start?"     100 F 6. EXPOSURE:  "Were you exposed to someone with influenza?"       Yes 7. FLU VACCINE: "Did you get a flu shot this year?"     Does not believe so, but can't remember  8. HIGH RISK DISEASE: "Do you have any chronic medical problems?" (e.g., heart or lung disease, asthma, weak immune system, or other HIGH RISK conditions)     Asthma, psoriatic arthritis  9. PREGNANCY: "Is there any chance you are pregnant?" "When was your last menstrual period?"     No 10. OTHER SYMPTOMS: "Do you have any other symptoms?"  (e.g., runny nose, muscle aches, headache, sore throat)       Headache, fatigue  Protocols used: Influenza (Flu) - [facility]

## 2023-08-04 ENCOUNTER — Ambulatory Visit: Payer: No Typology Code available for payment source | Admitting: Internal Medicine

## 2023-08-04 ENCOUNTER — Encounter: Payer: Self-pay | Admitting: Internal Medicine

## 2023-08-04 ENCOUNTER — Ambulatory Visit (INDEPENDENT_AMBULATORY_CARE_PROVIDER_SITE_OTHER)
Admission: RE | Admit: 2023-08-04 | Discharge: 2023-08-04 | Disposition: A | Discharge status: Home / Self Care | Payer: No Typology Code available for payment source | Source: Ambulatory Visit | Attending: Internal Medicine | Admitting: Internal Medicine

## 2023-08-04 VITALS — BP 136/82 | HR 82 | Temp 98.6°F | Ht 64.0 in | Wt 222.0 lb

## 2023-08-04 DIAGNOSIS — J101 Influenza due to other identified influenza virus with other respiratory manifestations: Secondary | ICD-10-CM | POA: Diagnosis not present

## 2023-08-04 MED ORDER — AMOXICILLIN-POT CLAVULANATE 875-125 MG PO TABS: 1.0000 | ORAL_TABLET | Freq: Two times a day (BID) | ORAL | 0 refills | Status: DC

## 2023-08-04 NOTE — Patient Instructions (Signed)
You should not be infectious for influenza A anymore

## 2023-08-04 NOTE — Progress Notes (Signed)
Subjective:    Patient ID: Bridget Maldonado, female    DOB: 18-Apr-1981, 43 y.o.   MRN: [id number]  HPI Here due to worsening cough and SOB with influenza  Diagnosed 5 days ago--and treated with oseltamivir--after positive home test Did feel a little better after 1 day Then started fever again---as high as 103 the following night Taking ibuprofen for fever--constantly Then felt even more tired---sleeping all day Cough and wheezing---gurgling Brings up brown bloody sputum Similar stuff from her nose DOE if walking--okay at rest  Using albuterol twice a day--brief help  Current Outpatient Medications on File Prior to Visit  Medication Sig Dispense Refill   albuterol (PROVENTIL HFA;VENTOLIN HFA) 108 (90 Base) MCG/ACT inhaler Inhale 2 puffs into the lungs every 6 (six) hours as needed for wheezing or shortness of breath. 1 Inhaler 1   ALPRAZolam (XANAX) 0.25 MG tablet Take 1 tablet (0.25 mg total) by mouth 3 (three) times daily as needed for anxiety. 60 tablet 0   aspirin EC 81 MG tablet Take 81 mg by mouth daily. Swallow whole.     Atogepant 60 MG TABS Take by mouth.     atorvastatin (LIPITOR) 20 MG tablet TAKE ONE TABLET (20 MG TOTAL) BY MOUTH DAILY. 30 tablet 11   butalbital-acetaminophen-caffeine (FIORICET) 50-325-40 MG tablet Take 1 tablet by mouth daily as needed for headache. 14 tablet 0   chlorpheniramine-HYDROcodone (TUSSIONEX) 10-8 MG/5ML Take 5 mLs by mouth every 12 (twelve) hours as needed for cough. 70 mL 0   DULoxetine (CYMBALTA) 60 MG capsule Take 1 capsule (60 mg total) by mouth daily. 30 capsule 3   lisinopril-hydrochlorothiazide (ZESTORETIC) 20-12.5 MG tablet Take 1 tablet by mouth daily. 90 tablet 3   mometasone (ELOCON) 0.1 % ointment APPLY TWICE DAILY AS NEEDED FOR PSORIASIS 45 g 1   montelukast (SINGULAIR) 10 MG tablet Take 1 tablet (10 mg total) by mouth at bedtime. 90 tablet 3   nortriptyline (PAMELOR) 10 MG capsule Take 10 mg by mouth 2 (two) times daily.      ondansetron (ZOFRAN-ODT) 4 MG disintegrating tablet Take 1 tablet (4 mg total) by mouth every 8 (eight) hours as needed. 20 tablet 0   prochlorperazine (COMPAZINE) 10 MG tablet Take 1 tablet (10 mg total) by mouth 2 (two) times daily as needed for nausea or vomiting. 20 tablet 0   traMADol (ULTRAM) 50 MG tablet Take 1 tablet (50 mg total) by mouth 2 (two) times daily as needed. 30 tablet 0   traZODone (DESYREL) 50 MG tablet Take 1 tablet by mouth at bedtime. (Patient not taking: Reported on 08/04/2023)     No current facility-administered medications on file prior to visit.    Allergies  Allergen Reactions   Sulfa Antibiotics Nausea And Vomiting   Cipro [Ciprofloxacin Hcl] Nausea And Vomiting   Effexor [Venlafaxine Hcl] Other (See Comments)    Excessive sleeping, slept for 2 days    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   Increased risk of breast cancer 07/2017  IBIS=40%   Kidney infection    Pylonephritis when pregnant   LGSIL on Pap smear of cervix    Migraines    Obese    Psoriatic arthritis (HCC)    Secondary oligomenorrhea 07/16/2017    Past Surgical History:  Procedure Laterality Date   BREAST BIOPSY Left 8 years   CESAREAN SECTION     COLONOSCOPY     COLONOSCOPY W/ BIOPSIES     CYSTOSCOPY N/A 03/04/2022   Procedure: CYSTOSCOPY;  Surgeon: [place] Bing, MD;  Location: [facility] OR;  Service: Gynecology;  Laterality: N/A;   EAR TUBE REMOVAL     ENDOMETRIAL BIOPSY  12/23/2021   ESOPHAGOGASTRODUODENOSCOPY     LAPAROSCOPIC CHOLECYSTECTOMY  6/09   Dr. Lindie Spruce   TOTAL  LAPAROSCOPIC HYSTERECTOMY WITH SALPINGECTOMY Bilateral 03/04/2022   Procedure: TOTAL LAPAROSCOPIC HYSTERECTOMY WITH SALPINGECTOMY;  Surgeon: [place] Bing, MD;  [facility];  Service: Gynecology;  Laterality: Bilateral;   TUBAL LIGATION  2017   TYMPANOSTOMY TUBE PLACEMENT      Family History  Problem Relation Age of Onset   Colon polyps Maternal Grandfather    Parkinson's disease Maternal Grandfather    Breast cancer Mother 56   Rheum arthritis Mother    Irritable bowel syndrome Mother    Asthma Father    Anxiety disorder Father        Severe, possible bipolar   Irritable bowel syndrome Maternal Grandmother    Diabetes Other        mat. great uncles   Alzheimer's disease Other        Dad's side   Colon cancer Other        mat GGF    Social History   Socioeconomic History   Marital status: Legally Separated    Spouse name: Not on file   Number of children: 1   Years of education: Not on file   Highest education level: Not on file  Occupational History   Occupation: Accounting    Comment: [employer]  Tobacco Use   Smoking status: Never    Passive exposure: Past   Smokeless tobacco: Never  Vaping Use   Vaping status: Never Used  Substance and Sexual Activity   Alcohol use: No    Alcohol/week: 0.0 standard drinks of alcohol   Drug use: No   Sexual activity: Yes    Birth control/protection: Surgical  Other Topics Concern   Not on file  Social History Narrative   Married 10/22   Social Drivers of Health   Financial Resource Strain: Not on file  Food Insecurity: No Food Insecurity (12/09/2022)   Hunger Vital Sign    Worried About Running Out of Food in the Last Year: Never true    Ran Out of Food in the Last Year: Never true  Transportation Needs: Not on file  Physical Activity: Not on file  Stress: Not on file  Social Connections: Not on file  Intimate Partner Violence: Not on file   Review of Systems Some N/V---some better--unless she eats Fluids cause  some nausea--but is tolerating    Objective:   Physical Exam Constitutional:      Appearance: Normal appearance.  HENT:     Head:     Comments: No sinus tenderness    Ears:     Comments: TMs with scarring but not inflamed  R>L    Mouth/Throat:     Comments: Slight pharyngeal injection Neck:     Comments: Non tender anterior cervical nodes Pulmonary:  Effort: Pulmonary effort is normal.     Breath sounds: No rales.     Comments: Slight expiratory prolongation and expiratory rhonchi/slight wheeze Musculoskeletal:     Cervical back: Neck supple.  Neurological:     Mental Status: She is alert.            Assessment & Plan:

## 2023-08-04 NOTE — Assessment & Plan Note (Addendum)
Finished with oseltamivir but worsening Likely secondary sinus or lung infection (bacterial) Asthma like syndrome as welll  CXR shows no change from December---just some bronchial thickening but no lobar consolidation Augmentin 875 bid x 7 days She has prednisone at home---recommended 40mg  daily for 3 days, then 20mg  daily for 3 days Continue albuterol

## 2023-08-10 ENCOUNTER — Other Ambulatory Visit: Payer: Self-pay | Admitting: Internal Medicine

## 2023-08-10 MED ORDER — TRAMADOL HCL 50 MG PO TABS: 50.0000 mg | ORAL_TABLET | Freq: Two times a day (BID) | ORAL | 0 refills | Status: DC | PRN

## 2023-08-10 MED ORDER — ALPRAZOLAM 0.25 MG PO TABS: 0.2500 mg | ORAL_TABLET | Freq: Three times a day (TID) | ORAL | 0 refills | Status: DC | PRN

## 2023-08-10 NOTE — Telephone Encounter (Signed)
Alprazolam last filled 06-22-23 #60 Tramadol last filled 06-30-23 #30 Last OV 08-04-23 Acute Next OV 09-14-23 [facility]

## 2023-08-15 ENCOUNTER — Encounter: Payer: Self-pay | Admitting: Internal Medicine

## 2023-09-07 ENCOUNTER — Other Ambulatory Visit: Payer: Self-pay | Admitting: Internal Medicine

## 2023-09-07 MED ORDER — TRAMADOL HCL 50 MG PO TABS: 50.0000 mg | ORAL_TABLET | Freq: Two times a day (BID) | ORAL | 0 refills | Status: DC | PRN

## 2023-09-07 NOTE — Telephone Encounter (Signed)
 Tramadol last filled 08-10-23 #30 Last OV 08-04-23 Acute Next OV 09-14-23 [facility]

## 2023-09-14 ENCOUNTER — Encounter: Payer: Self-pay | Admitting: Internal Medicine

## 2023-09-14 ENCOUNTER — Ambulatory Visit (INDEPENDENT_AMBULATORY_CARE_PROVIDER_SITE_OTHER): Payer: No Typology Code available for payment source | Admitting: Internal Medicine

## 2023-09-14 VITALS — BP 130/88 | HR 87 | Temp 98.8°F | Ht 63.5 in | Wt 223.0 lb

## 2023-09-14 DIAGNOSIS — F319 Bipolar disorder, unspecified: Secondary | ICD-10-CM

## 2023-09-14 DIAGNOSIS — Z0000 Encounter for general adult medical examination without abnormal findings: Secondary | ICD-10-CM

## 2023-09-14 DIAGNOSIS — Z8673 Personal history of transient ischemic attack (TIA), and cerebral infarction without residual deficits: Secondary | ICD-10-CM | POA: Diagnosis not present

## 2023-09-14 DIAGNOSIS — L4050 Arthropathic psoriasis, unspecified: Secondary | ICD-10-CM

## 2023-09-14 MED ORDER — MOMETASONE FUROATE 0.1 % EX OINT: TOPICAL_OINTMENT | CUTANEOUS | 1 refills | Status: AC

## 2023-09-14 MED ORDER — ATORVASTATIN CALCIUM 20 MG PO TABS: 20.0000 mg | ORAL_TABLET | Freq: Every day | ORAL | 3 refills | Status: DC

## 2023-09-14 MED ORDER — DIVALPROEX SODIUM 250 MG PO DR TAB: 250.0000 mg | DELAYED_RELEASE_TABLET | Freq: Three times a day (TID) | ORAL | 3 refills | Status: DC

## 2023-09-14 MED ORDER — ALPRAZOLAM 0.25 MG PO TABS: 0.2500 mg | ORAL_TABLET | Freq: Three times a day (TID) | ORAL | 0 refills | Status: DC | PRN

## 2023-09-14 NOTE — Assessment & Plan Note (Addendum)
 Worse again Multiple stressors Depression, irritability, etc (type 2) On duloxetine Had abilify in the past--gained a lot of weight----so will try depakote 250 bid and see back in 3-4 weeks

## 2023-09-14 NOTE — Assessment & Plan Note (Signed)
 Needs to go back to rheumatology

## 2023-09-14 NOTE — Assessment & Plan Note (Signed)
 Asked her to restart the atorvastatin

## 2023-09-14 NOTE — Addendum Note (Signed)
 Addended by: Tillman Abide I on: 09/14/2023 10:13 AM   Modules accepted: Orders

## 2023-09-14 NOTE — Progress Notes (Signed)
 Subjective:    Patient ID: Bridget Maldonado, female    DOB: 31-Aug-1980, 43 y.o.   MRN: [id number]  HPI Here for physical  Feels stressed "to the max" Very angry and withdrawn (won't leave her house) Feels like the cymbalta isn't working any more Feels ready "to snap"  Having headaches--since the stroke Complex migraines--and worse in the past couple of months (will have her "down" for a couple of days) Going back to neurology next week--trying to get approval for botox Has compazine/toradol/benedryl for prn use (this cocktail worked in [employer] longer than IV but works She has trouble concentrating and thinking of what she wants to say Easily distracted--not something new Will gasp for air---"brain doesn't remember to breathe" Gets left sided numbness with this also--can "happen anytime" --so doesn't want to go out Depressed ongoing--mostly relates to the headaches and ongoing physical symptoms Also stress with daughter seeing psychiatrist--and talks about suicidal ideation (but she thinks she doesn't really mean it)  Still separated---moving towards divorce Didn't find psychiatry visits helpful Hasn't seen counselor  Trying to lose weight Wonders about zepbound  Current Outpatient Medications on File Prior to Visit  Medication Sig Dispense Refill   albuterol (PROVENTIL HFA;VENTOLIN HFA) 108 (90 Base) MCG/ACT inhaler Inhale 2 puffs into the lungs every 6 (six) hours as needed for wheezing or shortness of breath. 1 Inhaler 1   ALPRAZolam (XANAX) 0.25 MG tablet Take 1 tablet (0.25 mg total) by mouth 3 (three) times daily as needed for anxiety. 60 tablet 0   aspirin EC 81 MG tablet Take 81 mg by mouth daily. Swallow whole.     DULoxetine (CYMBALTA) 60 MG capsule Take 1 capsule (60 mg total) by mouth daily. 30 capsule 3   lisinopril-hydrochlorothiazide (ZESTORETIC) 20-12.5 MG tablet Take 1 tablet by mouth daily. 90 tablet 3   mometasone (ELOCON) 0.1 % ointment APPLY TWICE DAILY  AS NEEDED FOR PSORIASIS 45 g 1   nortriptyline (PAMELOR) 10 MG capsule Take 10 mg by mouth 2 (two) times daily.     ondansetron (ZOFRAN-ODT) 4 MG disintegrating tablet Take 1 tablet (4 mg total) by mouth every 8 (eight) hours as needed. 20 tablet 0   prochlorperazine (COMPAZINE) 10 MG tablet Take 1 tablet (10 mg total) by mouth 2 (two) times daily as needed for nausea or vomiting. 20 tablet 0   traMADol (ULTRAM) 50 MG tablet Take 1 tablet (50 mg total) by mouth 2 (two) times daily as needed. 30 tablet 0   No current facility-administered medications on file prior to visit.    Allergies  Allergen Reactions   Sulfa Antibiotics Nausea And Vomiting   Cipro [Ciprofloxacin Hcl] Nausea And Vomiting   Effexor [Venlafaxine Hcl] Other (See Comments)    Excessive sleeping, slept for 2 days    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   Increased risk of breast cancer 07/2017   IBIS=40%   Kidney infection    Pylonephritis when pregnant   LGSIL on Pap smear of cervix  Migraines    Obese    Psoriatic arthritis (HCC)    Secondary oligomenorrhea 07/16/2017    Past Surgical History:  Procedure Laterality Date   BREAST BIOPSY Left 8 years   CESAREAN SECTION     COLONOSCOPY     COLONOSCOPY W/ BIOPSIES     CYSTOSCOPY N/A 03/04/2022   Procedure: CYSTOSCOPY;  Surgeon: Nez Perce Bing, MD;  Location: [facility] OR;  Service: Gynecology;  Laterality: N/A;   EAR TUBE REMOVAL     ENDOMETRIAL BIOPSY  12/23/2021   ESOPHAGOGASTRODUODENOSCOPY     LAPAROSCOPIC CHOLECYSTECTOMY  6/09   Dr. Lindie Spruce   TOTAL  LAPAROSCOPIC HYSTERECTOMY WITH SALPINGECTOMY Bilateral 03/04/2022   Procedure: TOTAL LAPAROSCOPIC HYSTERECTOMY WITH SALPINGECTOMY;  Surgeon: Defiance Bing, MD;  Location: [facility] OR;  Service: Gynecology;  Laterality: Bilateral;   TUBAL LIGATION  2017   TYMPANOSTOMY TUBE PLACEMENT      Family History  Problem Relation Age of Onset   Colon polyps Maternal Grandfather    Parkinson's disease Maternal Grandfather    Breast cancer Mother 42   Rheum arthritis Mother    Irritable bowel syndrome Mother    Asthma Father    Anxiety disorder Father        Severe, possible bipolar   Irritable bowel syndrome Maternal Grandmother    Diabetes Other        mat. great uncles   Alzheimer's disease Other        Dad's side   Colon cancer Other        mat GGF    Social History   Socioeconomic History   Marital status: Legally Separated    Spouse name: Not on file   Number of children: 1   Years of education: Not on file   Highest education level: Not on file  Occupational History   Occupation: Accounting    Comment: [employer]  Tobacco Use   Smoking status: Never    Passive exposure: Past   Smokeless tobacco: Never  Vaping Use   Vaping status: Never Used  Substance and Sexual Activity   Alcohol use: No    Alcohol/week: 0.0 standard drinks of alcohol   Drug use: No   Sexual activity: Yes    Birth control/protection: Surgical  Other Topics Concern   Not on file  Social History Narrative   Married 10/22   Social Drivers of Health   Financial Resource Strain: Not on file  Food Insecurity: No Food Insecurity (12/09/2022)   Hunger Vital Sign    Worried About Running Out of Food in the Last Year: Never true    Ran Out of Food in the Last Year: Never true  Transportation Needs: Not on file  Physical Activity: Not on file  Stress: Not on file  Social Connections: Not on file  Intimate Partner Violence: Not on file   Review of Systems  Constitutional:        Frustrated by weight Wears  seat belt Does go to the gym  HENT:  Negative for dental problem, hearing loss, tinnitus and trouble swallowing.        Overdue for dentist  Eyes:  Negative for visual disturbance.       No diplopia or unilateral vision loss  Respiratory:  Positive for cough and wheezing. Negative for chest tightness and shortness of breath.        Inhaler helps Has left over advair also --uses occasionally  Cardiovascular:  Positive for palpitations. Negative for chest pain and leg swelling.  Gastrointestinal:  IBS with constipation and diarrhea Occ heartburn--tums helps  Endocrine: Negative for polydipsia and polyuria.  Genitourinary:  Positive for frequency. Negative for dysuria and hematuria.  Musculoskeletal:        Bad back and other joint pains---psoriatic arthritis (rheumatologist left)  Skin:  Positive for rash.  Allergic/Immunologic: Positive for environmental allergies.       Helped by OTC meds  Neurological:  Positive for dizziness and headaches. Negative for syncope.  Hematological:  Negative for adenopathy. Does not bruise/bleed easily.  Psychiatric/Behavioral:  Positive for decreased concentration, dysphoric mood and sleep disturbance. The patient is nervous/anxious.        Objective:   Physical Exam Constitutional:      Appearance: Normal appearance. She is obese.  HENT:     Mouth/Throat:     Pharynx: No oropharyngeal exudate or posterior oropharyngeal erythema.  Eyes:     Conjunctiva/sclera: Conjunctivae normal.     Pupils: Pupils are equal, round, and reactive to light.  Cardiovascular:     Rate and Rhythm: Normal rate and regular rhythm.     Pulses: Normal pulses.     Heart sounds: No murmur heard.    No gallop.  Pulmonary:     Effort: Pulmonary effort is normal.     Breath sounds: Normal breath sounds. No wheezing or rales.  Abdominal:     Palpations: Abdomen is soft.     Tenderness: There is no abdominal tenderness.  Musculoskeletal:     Cervical back: Neck  supple.     Right lower leg: No edema.     Left lower leg: No edema.  Lymphadenopathy:     Cervical: No cervical adenopathy.  Skin:    Comments: Some psoriatic plaques  Neurological:     General: No focal deficit present.     Mental Status: She is alert and oriented to person, place, and time.  Psychiatric:        Mood and Affect: Mood normal.        Behavior: Behavior normal.            Assessment & Plan:

## 2023-09-14 NOTE — Assessment & Plan Note (Signed)
 Does work on exercise Needs flu vaccine in fall--not excited about COVID vaccine Yearly mammograms Colon screening at 47

## 2023-10-05 ENCOUNTER — Other Ambulatory Visit: Payer: Self-pay | Admitting: Internal Medicine

## 2023-10-05 MED ORDER — TRAMADOL HCL 50 MG PO TABS: 50.0000 mg | ORAL_TABLET | Freq: Two times a day (BID) | ORAL | 0 refills | Status: DC | PRN

## 2023-10-05 NOTE — Telephone Encounter (Signed)
 Tramadol last filled 09-07-23 #30 Last OV 09-14-23 Acute Next OV 10-13-23 [facility]

## 2023-10-13 ENCOUNTER — Ambulatory Visit: Admitting: Internal Medicine

## 2023-10-20 ENCOUNTER — Ambulatory Visit: Admitting: Internal Medicine

## 2023-10-20 ENCOUNTER — Encounter: Payer: Self-pay | Admitting: Internal Medicine

## 2023-10-20 VITALS — BP 128/84 | HR 87 | Temp 98.7°F | Ht 63.5 in | Wt 224.0 lb

## 2023-10-20 DIAGNOSIS — L4050 Arthropathic psoriasis, unspecified: Secondary | ICD-10-CM

## 2023-10-20 DIAGNOSIS — F319 Bipolar disorder, unspecified: Secondary | ICD-10-CM

## 2023-10-20 MED ORDER — DIVALPROEX SODIUM 500 MG PO DR TAB: 500.0000 mg | DELAYED_RELEASE_TABLET | Freq: Two times a day (BID) | ORAL | 3 refills | Status: AC

## 2023-10-20 MED ORDER — ALPRAZOLAM 0.25 MG PO TABS: 0.2500 mg | ORAL_TABLET | Freq: Three times a day (TID) | ORAL | 0 refills | Status: DC | PRN

## 2023-10-20 NOTE — Progress Notes (Signed)
 Subjective:    Patient ID: Bridget Maldonado, female    DOB: 22-Sep-1980, 43 y.o.   MRN: [id number]  HPI Here for follow up of her bipolar type 2   Has not had a headache since starting depakote  Had botox 2 weeks after starting Very happy about this  Still very irritated at times--but clearly better Doesn't feel "weird" Still needs the xanax  twice a day  Walking after birth--so more active  Current Outpatient Medications on File Prior to Visit  Medication Sig Dispense Refill   albuterol  (PROVENTIL  HFA;VENTOLIN  HFA) 108 (90 Base) MCG/ACT inhaler Inhale 2 puffs into the lungs every 6 (six) hours as needed for wheezing or shortness of breath. 1 Inhaler 1   ALPRAZolam  (XANAX ) 0.25 MG tablet Take 1 tablet (0.25 mg total) by mouth 3 (three) times daily as needed for anxiety. 60 tablet 0   aspirin  EC 81 MG tablet Take 81 mg by mouth daily. Swallow whole.     atorvastatin  (LIPITOR) 20 MG tablet Take 1 tablet (20 mg total) by mouth daily. 90 tablet 3   divalproex  (DEPAKOTE ) 250 MG DR tablet Take 1 tablet (250 mg total) by mouth 3 (three) times daily. 60 tablet 3   lisinopril -hydrochlorothiazide  (ZESTORETIC ) 20-12.5 MG tablet Take 1 tablet by mouth daily. 90 tablet 3   mometasone  (ELOCON ) 0.1 % ointment APPLY TWICE DAILY AS NEEDED FOR PSORIASIS 45 g 1   nortriptyline (PAMELOR) 10 MG capsule Take 10 mg by mouth 2 (two) times daily.     ondansetron  (ZOFRAN -ODT) 4 MG disintegrating tablet Take 1 tablet (4 mg total) by mouth every 8 (eight) hours as needed. 20 tablet 0   prochlorperazine  (COMPAZINE ) 10 MG tablet Take 1 tablet (10 mg total) by mouth 2 (two) times daily as needed for nausea or vomiting. 20 tablet 0   traMADol  (ULTRAM ) 50 MG tablet Take 1 tablet (50 mg total) by mouth 2 (two) times daily as needed. 30 tablet 0   No current facility-administered medications on file prior to visit.    Allergies  Allergen Reactions   Sulfa  Antibiotics Nausea And Vomiting   Cipro [Ciprofloxacin Hcl]  Nausea And Vomiting   Effexor [Venlafaxine Hcl] Other (See Comments)    Excessive sleeping, slept for 2 days    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   Increased risk of breast cancer 07/2017   IBIS=40%   Kidney infection    Pylonephritis when pregnant   LGSIL on Pap smear of cervix    Migraines    Obese    Psoriatic arthritis (HCC)    Secondary oligomenorrhea 07/16/2017    Past Surgical History:  Procedure Laterality Date   BREAST BIOPSY Left 8 years   CESAREAN SECTION     COLONOSCOPY     COLONOSCOPY W/ BIOPSIES     CYSTOSCOPY N/A 03/04/2022   Procedure: CYSTOSCOPY;  Surgeon: Raynell Caller, MD;  Location: [facility] OR;  Service: Gynecology;  Laterality: N/A;   EAR TUBE REMOVAL     ENDOMETRIAL BIOPSY  12/23/2021  ESOPHAGOGASTRODUODENOSCOPY     LAPAROSCOPIC CHOLECYSTECTOMY  6/09   Dr. Mammie Sears   LAPAROSCOPIC HYSTERECTOMY     TOTAL LAPAROSCOPIC HYSTERECTOMY WITH SALPINGECTOMY Bilateral 03/04/2022   Procedure: TOTAL LAPAROSCOPIC HYSTERECTOMY WITH SALPINGECTOMY;  Surgeon: Raynell Caller, MD;  Location: [facility] OR;  Service: Gynecology;  Laterality: Bilateral;   TUBAL LIGATION  2017   TYMPANOSTOMY TUBE PLACEMENT      Family History  Problem Relation Age of Onset   Colon polyps Maternal Grandfather    Parkinson's disease Maternal Grandfather    Breast cancer Mother 66   Rheum arthritis Mother    Irritable bowel syndrome Mother    Asthma Father    Anxiety disorder Father        Severe, possible bipolar   Irritable bowel syndrome Maternal Grandmother    Diabetes Other         mat. great uncles   Alzheimer's disease Other        Dad's side   Colon cancer Other        mat GGF    Social History   Socioeconomic History   Marital status: Legally Separated    Spouse name: Not on file   Number of children: 1   Years of education: Not on file   Highest education level: Not on file  Occupational History   Occupation: Accounting    Comment: [employer]  Tobacco Use   Smoking status: Never    Passive exposure: Past   Smokeless tobacco: Never  Vaping Use   Vaping status: Never Used  Substance and Sexual Activity   Alcohol use: No    Alcohol/week: 0.0 standard drinks of alcohol   Drug use: No   Sexual activity: Yes    Birth control/protection: Surgical  Other Topics Concern   Not on file  Social History Narrative   Married 10/22   Social Drivers of Health   Financial Resource Strain: Not on file  Food Insecurity: No Food Insecurity (12/09/2022)   Hunger Vital Sign    Worried About Running Out of Food in the Last Year: Never true    Ran Out of Food in the Last Year: Never true  Transportation Needs: Not on file  Physical Activity: Not on file  Stress: Not on file  Social Connections: Not on file  Intimate Partner Violence: Not on file   Review of Systems Sleep still not great--but melatonin helps if she is having a problem Appetite is not that big--but eats okay     Objective:   Physical Exam Constitutional:      Appearance: Normal appearance.  Neurological:     Mental Status: She is alert.  Psychiatric:     Comments: Smiling and very upbeat!            Assessment & Plan:

## 2023-10-20 NOTE — Assessment & Plan Note (Signed)
 Markedly improved on the valproate Still with some irritability--so will increase the dose to 500 bid Will refill the xanax --hopefully be able to reduce her need for this

## 2023-10-20 NOTE — Assessment & Plan Note (Signed)
 Needs referral back to her rheumatologist

## 2023-11-02 ENCOUNTER — Other Ambulatory Visit: Payer: Self-pay | Admitting: Internal Medicine

## 2023-11-02 MED ORDER — TRAMADOL HCL 50 MG PO TABS: 50.0000 mg | ORAL_TABLET | Freq: Two times a day (BID) | ORAL | 0 refills | Status: DC | PRN

## 2023-11-18 ENCOUNTER — Encounter: Payer: Self-pay | Admitting: Internal Medicine

## 2023-11-24 ENCOUNTER — Emergency Department

## 2023-11-24 ENCOUNTER — Emergency Department
Admission: EM | Admit: 2023-11-24 | Discharge: 2023-11-24 | Disposition: A | Discharge status: Home / Self Care | Attending: Emergency Medicine | Admitting: Emergency Medicine

## 2023-11-24 DIAGNOSIS — I10 Essential (primary) hypertension: Secondary | ICD-10-CM | POA: Insufficient documentation

## 2023-11-24 DIAGNOSIS — R479 Unspecified speech disturbances: Secondary | ICD-10-CM | POA: Diagnosis not present

## 2023-11-24 DIAGNOSIS — G43809 Other migraine, not intractable, without status migrainosus: Secondary | ICD-10-CM | POA: Diagnosis not present

## 2023-11-24 DIAGNOSIS — R531 Weakness: Secondary | ICD-10-CM | POA: Insufficient documentation

## 2023-11-24 DIAGNOSIS — J45909 Unspecified asthma, uncomplicated: Secondary | ICD-10-CM | POA: Diagnosis not present

## 2023-11-24 DIAGNOSIS — G43909 Migraine, unspecified, not intractable, without status migrainosus: Secondary | ICD-10-CM | POA: Diagnosis not present

## 2023-11-24 DIAGNOSIS — R200 Anesthesia of skin: Secondary | ICD-10-CM | POA: Diagnosis present

## 2023-11-24 LAB — COMPREHENSIVE METABOLIC PANEL WITH GFR
ALT: 19 U/L (ref 0–44)
AST: 34 U/L (ref 15–41)
Albumin: 3.7 g/dL (ref 3.5–5.0)
Alkaline Phosphatase: 99 U/L (ref 38–126)
Anion gap: 8 (ref 5–15)
BUN: 15 mg/dL (ref 6–20)
CO2: 24 mmol/L (ref 22–32)
Calcium: 8.7 mg/dL — ABNORMAL LOW (ref 8.9–10.3)
Chloride: 105 mmol/L (ref 98–111)
Creatinine, Ser: 0.8 mg/dL (ref 0.44–1.00)
GFR, Estimated: >60 mL/min (ref >60)
Glucose, Bld: 118 mg/dL — ABNORMAL HIGH (ref 70–99)
Potassium: 4.3 mmol/L (ref 3.5–5.1)
Sodium: 137 mmol/L (ref 135–145)
Total Bilirubin: 1 mg/dL (ref 0.0–1.2)
Total Protein: 7.8 g/dL (ref 6.5–8.1)

## 2023-11-24 LAB — DIFFERENTIAL
Abs Immature Granulocytes: 0 10*3/uL (ref 0.00–0.07)
Basophils Absolute: 0.1 10*3/uL (ref 0.0–0.1)
Basophils Relative: 1 %
Eosinophils Absolute: 0.2 10*3/uL (ref 0.0–0.5)
Eosinophils Relative: 3 %
Immature Granulocytes: 0 %
Lymphocytes Relative: 41 %
Lymphs Abs: 2.5 10*3/uL (ref 0.7–4.0)
Monocytes Absolute: 0.4 10*3/uL (ref 0.1–1.0)
Monocytes Relative: 6 %
Neutro Abs: 3 10*3/uL (ref 1.7–7.7)
Neutrophils Relative %: 49 %

## 2023-11-24 LAB — ETHANOL: Alcohol, Ethyl (B): <15 mg/dL (ref <15)

## 2023-11-24 LAB — CBC
HCT: 44.8 % (ref 36.0–46.0)
Hemoglobin: 14.7 g/dL (ref 12.0–15.0)
MCH: 27.8 pg (ref 26.0–34.0)
MCHC: 32.8 g/dL (ref 30.0–36.0)
MCV: 84.8 fL (ref 80.0–100.0)
Platelets: 206 10*3/uL (ref 150–400)
RBC: 5.28 MIL/uL — ABNORMAL HIGH (ref 3.87–5.11)
RDW: 13.1 % (ref 11.5–15.5)
WBC: 6.2 10*3/uL (ref 4.0–10.5)
nRBC: 0 % (ref 0.0–0.2)

## 2023-11-24 LAB — CBG MONITORING, ED: Glucose-Capillary: 128 mg/dL — ABNORMAL HIGH (ref 70–99)

## 2023-11-24 LAB — VALPROIC ACID LEVEL: Valproic Acid Lvl: 46 ug/mL — ABNORMAL LOW (ref 50–100)

## 2023-11-24 MED ORDER — PROCHLORPERAZINE EDISYLATE 10 MG/2ML IJ SOLN
10.0000 mg | Freq: Once | INTRAMUSCULAR | Status: AC
  Administered 2023-11-24: 10 mg via INTRAVENOUS
  Filled 2023-11-24: qty 2

## 2023-11-24 MED ORDER — DIPHENHYDRAMINE HCL 50 MG/ML IJ SOLN
25.0000 mg | Freq: Once | INTRAMUSCULAR | Status: AC
  Administered 2023-11-24: 25 mg via INTRAVENOUS
  Filled 2023-11-24: qty 1

## 2023-11-24 MED ORDER — LACTATED RINGERS IV BOLUS
1000.0000 mL | Freq: Once | INTRAVENOUS | Status: AC
  Administered 2023-11-24: 1000 mL via INTRAVENOUS

## 2023-11-24 MED ORDER — ACETAMINOPHEN 500 MG PO TABS
1000.0000 mg | ORAL_TABLET | Freq: Once | ORAL | Status: AC
  Administered 2023-11-24: 1000 mg via ORAL
  Filled 2023-11-24: qty 2

## 2023-11-24 MED ORDER — SODIUM CHLORIDE 0.9% FLUSH
3.0000 mL | Freq: Once | INTRAVENOUS | Status: AC
  Administered 2023-11-24: 3 mL via INTRAVENOUS

## 2023-11-24 NOTE — ED Notes (Signed)
 Patient states and appears to be back at baseline with speech and movement. Request to be discharged to make it to her medication appointment for her Migraines with neurology

## 2023-11-24 NOTE — Progress Notes (Addendum)
   11/24/23 1115  Spiritual Encounters  Type of Visit Initial  Care provided to: [facility] partners present during encounter Nurse  Reason for visit Code  OnCall Visit Yes   Chaplain responded to Code Stroke.  Patient in CT but mother in the room.  Mother shared this is something her daughter has been dealing with for a while.  Daughter lives with severe migraines and is being treated for that.  Daughter did have a small stroke before.  Mother said it just needs to be checked out so, they are here.  Chaplain offered a blanket, nourishment and prayer and mother said she was fine and declined the offers.  Mother said she'd been praying on her own and didn't need anything at this time.  Chaplain will follow-up as needed/requested by staff, patient or family.    Rev. Rana M. Nolon Baxter, M.Div. Chaplain Resident [facility]

## 2023-11-24 NOTE — Discharge Instructions (Signed)
 Please make sure to follow-up with your neurologist for further management of your symptoms.  Try to follow-up on your lab results in your MyChart.

## 2023-11-24 NOTE — Progress Notes (Signed)
 Code stroke cart activated at 1121. Pt already in CT.  Dr. Cleone Dad paged at 1121. Dr. Cleone Dad at bedside in CT at 1123. Wanetta Guthrie, Tele Stroke RN

## 2023-11-24 NOTE — Consult Note (Signed)
 NEUROLOGY CONSULT NOTE   Date of service: November 24, 2023 Patient Name: Bridget Maldonado MRN:  [id number] DOB:  January 17, 1981 Chief Complaint: "headache, confusion" Requesting Provider: Shane Darling, MD  History of Present Illness  Bridget Maldonado is a 43 y.o. 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  However she also endorses significant cognitive impairment at this time and confusion and is not oriented to month making her timeline unreliable  She was recently treated for a left breast abscess which she reported was not improving initially.  However when I specifically questioned how much narcotic medication she was requiring for pain control she reported she had not needed any for the last 5 days as she was not in significant pain anymore, suggesting that this has improved  Mother at bedside confirms that this is the patient's typical presentation with her headaches  Regarding her autoimmune history 14 years ago she had a mildly elevated rheumatoid factor of 27.1 which was normalized on repeat check 8 years ago to less than 10  LKW: Patient does not report last known well consistently in the setting of some confusion, possibly in 10 AM but possibly 6 AM Modified rankin score: 0 IV Thrombolysis: No, not clearly within the treatment window and exam most consistent with prior functional neurologic symptoms EVT: No, not consistent with an LVO   NIHSS  components Score: Comment  1a Level of Conscious 0[x]  1[]  2[]  3[]      1b LOC Questions 0[]  1[x]  2[]     Reported month was July  1c LOC Commands 0[x]  1[]  2[]       2 Best Gaze 0[x]  1[]  2[]       3 Visual 0[]  1[]  2[x]  3[]    Incorrectly reported fingers on the left side but completed finger-to-nose accurately bilaterally even when finger was held in her left visual field  4 Facial Palsy 0[x]  1[]  2[]  3[]      5a Motor Arm - left 0[]  1[]  2[]  3[x]  4[]  UN[]    5b Motor Arm - Right 0[]  1[]  2[]  3[x]  4[]  UN[]    6a Motor Leg - Left 0[]  1[]  2[]  3[x]  4[]  UN[]    6b Motor Leg - Right 0[]  1[]  2[]  3[x]  4[]  UN[]    7 Limb Ataxia 0[]  1[]  2[]  UN[x]      8 Sensory 0[]  1[x]  2[]  UN[]    Subjectively has less sensation on the left side  9 Best Language 0[]  1[x]  2[]  3[]    Reports her thumb is her middle finger  10 Dysarthria 0[]  1[x]  2[]  UN[]      11 Extinct. 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   Increased risk of breast cancer 07/2017   IBIS=40%   Kidney infection    Pylonephritis when pregnant   LGSIL on Pap smear of cervix    Migraines    Obese    Psoriatic arthritis (HCC)    Secondary oligomenorrhea 07/16/2017    Past Surgical History:  Procedure Laterality Date   BREAST BIOPSY Left 8 years   CESAREAN SECTION     COLONOSCOPY     COLONOSCOPY W/ BIOPSIES     CYSTOSCOPY N/A 03/04/2022   Procedure: CYSTOSCOPY;  Surgeon: Raynell Caller,  MD;  Location: [facility] OR;  Service: Gynecology;  Laterality: N/A;   EAR TUBE REMOVAL     ENDOMETRIAL BIOPSY  12/23/2021   ESOPHAGOGASTRODUODENOSCOPY     LAPAROSCOPIC CHOLECYSTECTOMY  6/09   Dr. Mammie Sears   LAPAROSCOPIC HYSTERECTOMY     TOTAL LAPAROSCOPIC HYSTERECTOMY WITH SALPINGECTOMY Bilateral 03/04/2022   Procedure: TOTAL LAPAROSCOPIC HYSTERECTOMY WITH SALPINGECTOMY;  Surgeon: Raynell Caller, MD;  Location: [facility] OR;  Service: Gynecology;  Laterality: Bilateral;   TUBAL LIGATION  2017   TYMPANOSTOMY TUBE PLACEMENT      Family History: Family History  Problem Relation Age of Onset   Colon polyps Maternal Grandfather    Parkinson's disease Maternal Grandfather    Breast cancer Mother 40   Rheum arthritis Mother    Irritable bowel syndrome Mother    Asthma Father    Anxiety disorder Father        Severe, possible bipolar   Irritable bowel syndrome Maternal Grandmother    Diabetes Other        mat. great uncles   Alzheimer's disease Other        Dad's side   Colon cancer Other        mat GGF    Social History  reports that she has never smoked. She has been exposed to tobacco smoke. She has never used smokeless tobacco. She reports that she does not drink alcohol and does not use drugs.  Allergies  Allergen Reactions   Sulfa  Antibiotics Nausea And Vomiting   Cipro [Ciprofloxacin Hcl] Nausea And Vomiting   Effexor [Venlafaxine Hcl] Other (See Comments)    Excessive sleeping, slept for 2 days    Medications  No current facility-administered medications for this encounter.  Current Outpatient Medications:    albuterol  (PROVENTIL  HFA;VENTOLIN  HFA) 108 (90 Base) MCG/ACT inhaler, Inhale 2 puffs into the lungs every 6 (six) hours as needed for wheezing or shortness of breath., Disp: 1 Inhaler, Rfl: 1   ALPRAZolam  (XANAX ) 0.25 MG tablet, Take 1 tablet (0.25 mg total) by mouth 3 (three) times daily as needed for anxiety., Disp: 60 tablet, Rfl: 0   aspirin  EC 81 MG tablet, Take 81 mg by  mouth daily. Swallow whole., Disp: , Rfl:    atorvastatin  (LIPITOR) 20 MG tablet, Take 1 tablet (20 mg total) by mouth daily., Disp: 90 tablet, Rfl: 3   divalproex  (DEPAKOTE ) 500 MG DR tablet, Take 1 tablet (500 mg total) by mouth 2 (two) times daily., Disp: 60 tablet, Rfl: 3   lisinopril -hydrochlorothiazide  (ZESTORETIC ) 20-12.5 MG tablet, Take 1 tablet by mouth daily., Disp: 90 tablet, Rfl: 3   mometasone  (ELOCON ) 0.1 % ointment, APPLY  TWICE DAILY AS NEEDED FOR PSORIASIS, Disp: 45 g, Rfl: 1   nortriptyline (PAMELOR) 10 MG capsule, Take 10 mg by mouth 2 (two) times daily., Disp: , Rfl:    ondansetron  (ZOFRAN -ODT) 4 MG disintegrating tablet, Take 1 tablet (4 mg total) by mouth every 8 (eight) hours as needed., Disp: 20 tablet, Rfl: 0   prochlorperazine  (COMPAZINE ) 10 MG tablet, Take 1 tablet (10 mg total) by mouth 2 (two) times daily as needed for nausea or vomiting., Disp: 20 tablet, Rfl: 0   traMADol  (ULTRAM ) 50 MG tablet, Take 1 tablet (50 mg total) by mouth 2 (two) times daily as needed., Disp: 30 tablet, Rfl: 0  Vitals   Vitals:   Dec 19, 2023 1114 12-19-2023 1115 12/19/2023 1200  BP: (!) 156/108    Pulse: 85  76  Resp: 18  11  Temp: 98.4 F (36.9 C)    TempSrc: Oral    SpO2: 100%  100%  Weight:  99.8 kg   Height:  5\' 4"  (1.626 m)     Body mass index is 37.76 kg/m.  Physical Exam   Constitutional: Appears well-developed and well-nourished.  Psych: Affect flat Eyes: No scleral injection HENT: No oropharyngeal obstruction.  MSK: no major joint deformities.  Cardiovascular: Normal rate and regular rhythm. Respiratory: Effort normal, non-labored breathing GI: Soft.  No distension. There is no tenderness.  Skin: Warm dry and intact visible skin  Neurologic Examination   Mental Status: Patient is awake, alert, gives inconsistent history, oriented to age but not month.  Does not correctly repeat complex sentences and does not appropriately name fingers.  Slurred/slow speech  intermittently.  However able to clearly communicate her needs Cranial Nerves: II: Visual Fields as documented above in NIH. Pupils are equal, round, and reactive to light.   III,IV, VI: EOMI without ptosis or diploplia.  V: Facial sensation is fully reduced on the left.  Splits midline on forehead vibration testing VII: Facial movement is symmetric (poor effort bilaterally).  VIII: hearing is intact to voice Motor/Gait: Reports inability to move any of her limbs antigravity.  However she is able to provide at least 4/5 strength bilaterally when being pulled up to sit at the side of the scanner.  She is able to ambulate with one-person modest assistance, bearing weight equally on both of her legs to transfer from the scanner to a wheelchair Sensory: Reports diminished sensation on the left Cerebellar: Does not perform    Labs/Imaging/Neurodiagnostic studies   CBC:  Recent Labs  Lab Dec 19, 2023 1131  WBC 6.2  NEUTROABS 3.0  HGB 14.7  HCT 44.8  MCV 84.8  PLT 206   Basic Metabolic Panel:  Lab Results  Component Value Date   NA 138 07/10/2023   K 3.5 07/10/2023   CO2 25 07/10/2023   GLUCOSE 94 07/10/2023   BUN 12 07/10/2023   CREATININE 0.83 07/10/2023   CALCIUM  9.1 07/10/2023   GFRNONAA >60 07/10/2023   GFRAA >60 10/13/2019   Lipid Panel:  Lab Results  Component Value Date   LDLCALC 131 (H) 12/05/2022   HgbA1c:  Lab Results  Component Value Date   HGBA1C 4.9 12/05/2022   Urine Drug Screen:     Component Value Date/Time   LABOPIA POSITIVE (A) 02/24/2023 1009   LABOPIA NONE DETECTED 02/18/2023 0847   COCAINSCRNUR NONE DETECTED 02/24/2023 1009   LABBENZ POSITIVE (A) 02/24/2023 1009   LABBENZ POSITIVE (A) 02/18/2023 0847   AMPHETMU NONE DETECTED 02/24/2023 1009   AMPHETMU NONE DETECTED 02/18/2023 0847  THCU NONE DETECTED 02/24/2023 1009   THCU NONE DETECTED 02/18/2023 0847   LABBARB POSITIVE (A) 02/24/2023 1009   LABBARB NONE DETECTED 02/18/2023 0847     Alcohol Level     Component Value Date/Time   ETH <10 07/10/2023 1238   INR  Lab Results  Component Value Date   INR 1.0 07/10/2023   APTT  Lab Results  Component Value Date   APTT 29 07/10/2023   CT Head without contrast(Personally reviewed): no acute intracranial process   12/05/2022 CTA head and neck 1. No intracranial large vessel occlusion, significant stenosis, aneurysm, or vascular malformation. 2. No significant stenosis of the major neck arteries.  ASSESSMENT   Bridget Maldonado is a 43 y.o. female presenting with her characteristic functional neurological symptoms in the setting of a headache.   RECOMMENDATIONS  - Migraine cocktail - UA, UDS - Check Depakote  level, follow-up pending CMP - MRI brain only if she is not returning to baseline but expect full return to baseline - Discussed with Dr. Drenda Gentle in person, and patient and mom at bedside were both comfortable with this plan ______________________________________________________________________  Baldwin Levee MD-PhD [facility] [phone] Available 7 AM to 7 PM, outside these hours please contact Neurologist on call listed on AMION   CRITICAL CARE Performed by: Ronnette Coke   Total critical care time: 30 minutes  Critical care time was exclusive of separately billable procedures and treating other patients.  Critical care was necessary to treat or prevent imminent or life-threatening deterioration, emergent evaluation for consideration of thrombectomy or thrombolytic.  Critical care was time spent personally by me on the following activities: development of treatment plan with patient and/or surrogate as well as nursing, discussions with consultants, evaluation of patient's response to treatment, examination of patient, obtaining history from patient or surrogate, ordering and performing treatments and interventions, ordering and review of laboratory studies, ordering and review of  radiographic studies, pulse oximetry and re-evaluation of patient's condition.

## 2023-11-24 NOTE — ED Provider Notes (Signed)
 Bridget Maldonado Provider Note    Event Date/Time   First MD Initiated Contact with Patient 11/24/23 1120     (approximate)   History   Numbness   HPI  Bridget Maldonado is a 43 y.o. female with history of bipolar disorder, panic disorder, history of hemiplegic migraines presenting with slowed speech, left-sided weakness and decree sensation.  States that she took the Fioricet  but did not help with symptoms.  States that she had completed course of antibiotics for right breast MRSA cellulitis, was thought to have right axillary abscess and is scheduled to be seen for follow-up for that.  She denies any fevers.  States that she has been a bit stressed recently, states that she gets regular Botox for her migraines and is due for repeat Botox injections soon.  No other infectious symptoms.   On independent chart review, patient was seen in April for her Botox injection for chronic migraines.  Physical Exam   Triage Vital Signs: ED Triage Vitals  Encounter Vitals Group     BP 11/24/23 1114 (!) 156/108     Systolic BP Percentile --      Diastolic BP Percentile --      Pulse Rate 11/24/23 1114 85     Resp 11/24/23 1114 18     Temp 11/24/23 1114 98.4 F (36.9 C)     Temp Source 11/24/23 1114 Oral     SpO2 11/24/23 1114 100 %     Weight 11/24/23 1115 220 lb (99.8 kg)     Height 11/24/23 1115 5\' 4"  (1.626 m)     Head Circumference --      Peak Flow --      Pain Score 11/24/23 1114 8     Pain Loc --      Pain Education --      Exclude from Growth Chart --     Most recent vital signs: Vitals:   11/24/23 1114 11/24/23 1200  BP: (!) 156/108   Pulse: 85 76  Resp: 18 11  Temp: 98.4 F (36.9 C)   SpO2: 100% 100%     General: Awake, no distress.  CV:  Good peripheral perfusion.  Resp:  Normal effort.  No respiratory distress or tachypnea Abd:  No distention.  Soft nontender Other:  No cranial nerve deficits, no focal numbness, patient states that she is  unable to grip bilaterally or wiggle her toes bilaterally although she was able to stand up and walk to the stretcher as well as get in bed without any focal weakness, her speech is intermittently slowed but when she is in the middle of explaining something it would go back to normal.  No ataxia when walking.  No erythema or fluctuant lesions, indurations to her right breast, her right axilla, has a palpable mobile lymph node, there is no induration or fluctuance, no overt erythema.   ED Results / Procedures / Treatments   Labs (all labs ordered are listed, but only abnormal results are displayed) Labs Reviewed  CBC - Abnormal; Notable for the following components:      Result Value   RBC 5.28 (*)    All other components within normal limits  VALPROIC ACID LEVEL - Abnormal; Notable for the following components:   Valproic Acid Lvl 46 (*)    All other components within normal limits  CBG MONITORING, ED - Abnormal; Notable for the following components:   Glucose-Capillary 128 (*)    All other components  within normal limits  DIFFERENTIAL  COMPREHENSIVE METABOLIC PANEL WITH GFR  ETHANOL  URINALYSIS, COMPLETE (UACMP) WITH MICROSCOPIC  URINE DRUG SCREEN, QUALITATIVE ([facility] ONLY)  PROTIME-INR  APTT  I-STAT CREATININE, ED  CBG MONITORING, ED     EKG  EKG shows, sinus rhythm, rate 72, normal QS, normal QTc, T wave flattening in 3, no ischemic ST elevation, not significantly changed compared to prior   RADIOLOGY On my independent interpretation, CT head without obvious intracranial hemorrhage   PROCEDURES:  Critical Care performed: No  Procedures   MEDICATIONS ORDERED IN ED: Medications  sodium chloride  flush (NS) 0.9 % injection 3 mL (3 mLs Intravenous Given 11/24/23 1151)  lactated ringers  bolus 1,000 mL (1,000 mLs Intravenous New Bag/Given 11/24/23 1151)  prochlorperazine  (COMPAZINE ) injection 10 mg (10 mg Intravenous Given 11/24/23 1209)  diphenhydrAMINE  (BENADRYL ) injection 25  mg (25 mg Intravenous Given 11/24/23 1204)  acetaminophen  (TYLENOL ) tablet 1,000 mg (1,000 mg Oral Given 11/24/23 1201)     IMPRESSION / MDM / ASSESSMENT AND PLAN / ED COURSE  I reviewed the triage vital signs and the nursing notes.                              Differential diagnosis includes, but is not limited to, CVA, complex migraine, functional disorder, intracranial hemorrhage.  Get labs, EKG, CT head.  Stroke alert was activated at triage given that her last known normal was at 1015 today.  Neurology consult.  Patient's presentation is most consistent with acute presentation with potential threat to life or bodily function.  Independent interpretation of labs and imaging below.  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  No indication for tPA, recommended migraine cocktail.  No need for MRI or admission at this time.  Will order migraine cocktail for patient.  Patient is now reporting that her symptoms have completely resolved, she is back to neurologic baseline, on assessment her speech is fluent, moving all 4 extremities without any weakness or numbness.  She does not want to stay for the rest of her labs, states that she wants to leave so that she can call to set up a Botox appointment for her migraines.  States that she can follow-up on the labs in her MyChart and will follow-up with her primary care doctor about the labs.  States that we can be missing electrolyte derangements if she leaves prior to labs returning, she understands the risk.  Considered but no indication for inpatient admission at this time, patient is safe for outpatient management.  Will discharge with strict precautions and instructions to follow-up with her primary care doctor as well as her neurologist for further management of her symptoms.  The patient is on the cardiac monitor to evaluate for evidence of arrhythmia and/or  significant heart rate changes.   Clinical Course as of 11/24/23 1240  Tue Nov 24, 2023  1143 Neurology consulted and evaluated the patient, states that patient has known history of functional migraines with the symptoms, was able to ambulate to the bed, has a normal neurological exam.  Recommended migraine cocktail, no need for MRI or admission. [TT]  1152 CT HEAD CODE STROKE WO CONTRAST IMPRESSION: 1. No CT evidence of acute intracranial abnormality. 2. ASPECTS is 10   [TT]    Clinical Course User Index [TT] Drenda Gentle Richard Champion, MD     FINAL CLINICAL IMPRESSION(S) /  ED DIAGNOSES   Final diagnoses:  Other migraine without status migrainosus, not intractable  Weakness  Numbness  Difficulty with speech     Rx / DC Orders   ED Discharge Orders     None        Note:  This document was prepared using Dragon voice recognition software and may include unintentional dictation errors.    Shane Darling, MD 11/24/23 [phone]

## 2023-11-24 NOTE — ED Notes (Signed)
 Code stroke  called  to  [facility]  at  11:12am

## 2023-11-24 NOTE — ED Notes (Addendum)
 1015 LNW-slurred speech,"not feeling well". Pt endorses L sided weakness/numbness, HX of CVA  Code stroke orders place  Secretary and charge made aware, pt to CT 1

## 2023-11-24 NOTE — ED Notes (Signed)
 This RN accompanied patient to CT. Presents with left sided numbness. Bilateral extremity weakness, slurred speech, and aphasia. Pt has a hx of complex migraines and strokes.

## 2023-11-30 ENCOUNTER — Encounter: Payer: Self-pay | Admitting: Internal Medicine

## 2023-11-30 MED ORDER — TRAMADOL HCL 50 MG PO TABS: 50.0000 mg | ORAL_TABLET | Freq: Two times a day (BID) | ORAL | 0 refills | Status: DC | PRN

## 2023-11-30 NOTE — Telephone Encounter (Signed)
 Last filled 11-02-23 #30 Last OV 10-20-23 Next OV 12-21-23 [facility]

## 2023-12-07 ENCOUNTER — Encounter: Payer: Self-pay | Admitting: Internal Medicine

## 2023-12-07 MED ORDER — ALPRAZOLAM 0.25 MG PO TABS: 0.2500 mg | ORAL_TABLET | Freq: Three times a day (TID) | ORAL | 0 refills | Status: DC | PRN

## 2023-12-07 NOTE — Telephone Encounter (Signed)
 Alprazolam  last written 10-20-23 #60 Last OV 10-20-23 Next OV 12-21-23 [facility]

## 2023-12-21 ENCOUNTER — Ambulatory Visit: Admitting: Internal Medicine

## 2023-12-22 ENCOUNTER — Other Ambulatory Visit: Payer: Self-pay | Admitting: Internal Medicine

## 2023-12-22 DIAGNOSIS — Z1231 Encounter for screening mammogram for malignant neoplasm of breast: Secondary | ICD-10-CM

## 2023-12-28 ENCOUNTER — Encounter: Payer: Self-pay | Admitting: Internal Medicine

## 2023-12-28 MED ORDER — TRAMADOL HCL 50 MG PO TABS: 50.0000 mg | ORAL_TABLET | Freq: Two times a day (BID) | ORAL | 0 refills | Status: DC | PRN

## 2023-12-28 NOTE — Telephone Encounter (Signed)
 LAST APPOINTMENT DATE: 10/20/23   NEXT APPOINTMENT DATE: 01/11/2024    LAST REFILL:11/30/23  QTY:30 no rf

## 2024-01-11 ENCOUNTER — Ambulatory Visit (INDEPENDENT_AMBULATORY_CARE_PROVIDER_SITE_OTHER): Admitting: Internal Medicine

## 2024-01-11 ENCOUNTER — Encounter: Payer: Self-pay | Admitting: Internal Medicine

## 2024-01-11 ENCOUNTER — Ambulatory Visit: Payer: Self-pay | Admitting: Internal Medicine

## 2024-01-11 VITALS — BP 138/88 | HR 84 | Temp 98.3°F | Wt 228.0 lb

## 2024-01-11 DIAGNOSIS — R519 Headache, unspecified: Secondary | ICD-10-CM | POA: Diagnosis not present

## 2024-01-11 DIAGNOSIS — F319 Bipolar disorder, unspecified: Secondary | ICD-10-CM | POA: Diagnosis not present

## 2024-01-11 DIAGNOSIS — K582 Mixed irritable bowel syndrome: Secondary | ICD-10-CM | POA: Diagnosis not present

## 2024-01-11 LAB — COMPREHENSIVE METABOLIC PANEL WITH GFR
ALT: 12 U/L (ref 0–35)
AST: 17 U/L (ref 0–37)
Albumin: 3.8 g/dL (ref 3.5–5.2)
Alkaline Phosphatase: 100 U/L (ref 39–117)
BUN: 15 mg/dL (ref 6–23)
CO2: 30 meq/L (ref 19–32)
Calcium: 8.8 mg/dL (ref 8.4–10.5)
Chloride: 102 meq/L (ref 96–112)
Creatinine, Ser: 0.7 mg/dL (ref 0.40–1.20)
GFR: 107.75 mL/min (ref >60.00)
Glucose, Bld: 86 mg/dL (ref 70–99)
Potassium: 4.2 meq/L (ref 3.5–5.1)
Sodium: 139 meq/L (ref 135–145)
Total Bilirubin: 0.4 mg/dL (ref 0.2–1.2)
Total Protein: 7 g/dL (ref 6.0–8.3)

## 2024-01-11 LAB — CBC
HCT: 42.6 % (ref 36.0–46.0)
Hemoglobin: 14 g/dL (ref 12.0–15.0)
MCHC: 32.9 g/dL (ref 30.0–36.0)
MCV: 84.6 fl (ref 78.0–100.0)
Platelets: 220 K/uL (ref 150.0–400.0)
RBC: 5.03 Mil/uL (ref 3.87–5.11)
RDW: 14 % (ref 11.5–15.5)
WBC: 4.9 K/uL (ref 4.0–10.5)

## 2024-01-11 NOTE — Assessment & Plan Note (Signed)
 Migraines also better with the depakote  and the botox injections

## 2024-01-11 NOTE — Assessment & Plan Note (Signed)
 Mixed Now more constipation and troubling Will set up with GI

## 2024-01-11 NOTE — Progress Notes (Signed)
 Subjective:    Patient ID: Bridget Maldonado, female    DOB: 1980/10/16, 43 y.o.   MRN: [id number]  HPI Here for follow up of bipolar disorder  Doing well with the depakote   Botox has been helping the headaches---and she finds that the depakote  also helps Irritability is definitely better  Now on tremfya--for the psoriatic arthritis No problems with this Helped rash quickly---but too soon to judge for joints Did find S-I erosions on x-ray--so trying to prevent further damage  Current Outpatient Medications on File Prior to Visit  Medication Sig Dispense Refill   albuterol  (PROVENTIL  HFA;VENTOLIN  HFA) 108 (90 Base) MCG/ACT inhaler Inhale 2 puffs into the lungs every 6 (six) hours as needed for wheezing or shortness of breath. 1 Inhaler 1   ALPRAZolam  (XANAX ) 0.25 MG tablet Take 1 tablet (0.25 mg total) by mouth 3 (three) times daily as needed for anxiety. 60 tablet 0   aspirin  EC 81 MG tablet Take 81 mg by mouth daily. Swallow whole.     atorvastatin  (LIPITOR) 20 MG tablet Take 1 tablet (20 mg total) by mouth daily. 90 tablet 3   divalproex  (DEPAKOTE ) 500 MG DR tablet Take 1 tablet (500 mg total) by mouth 2 (two) times daily. 60 tablet 3   lisinopril -hydrochlorothiazide  (ZESTORETIC ) 20-12.5 MG tablet Take 1 tablet by mouth daily. 90 tablet 3   mometasone  (ELOCON ) 0.1 % ointment APPLY TWICE DAILY AS NEEDED FOR PSORIASIS 45 g 1   ondansetron  (ZOFRAN -ODT) 4 MG disintegrating tablet Take 1 tablet (4 mg total) by mouth every 8 (eight) hours as needed. 20 tablet 0   prochlorperazine  (COMPAZINE ) 10 MG tablet Take 1 tablet (10 mg total) by mouth 2 (two) times daily as needed for nausea or vomiting. 20 tablet 0   traMADol  (ULTRAM ) 50 MG tablet Take 1 tablet (50 mg total) by mouth 2 (two) times daily as needed. 30 tablet 0   nortriptyline (PAMELOR) 10 MG capsule Take 10 mg by mouth 2 (two) times daily. (Patient not taking: Reported on 01/11/2024)     No current facility-administered medications on  file prior to visit.    Allergies  Allergen Reactions   Sulfa  Antibiotics Nausea And Vomiting   Cipro [Ciprofloxacin Hcl] Nausea And Vomiting   Effexor [Venlafaxine Hcl] Other (See Comments)    Excessive sleeping, slept for 2 days    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   Increased risk of breast cancer 07/2017   IBIS=40%   Kidney infection    Pylonephritis when pregnant   LGSIL on Pap smear of cervix    Migraines    Obese    Psoriatic arthritis (HCC)    Secondary oligomenorrhea 07/16/2017    Past Surgical History:  Procedure Laterality Date   BREAST BIOPSY Left 8 years   CESAREAN SECTION     COLONOSCOPY     COLONOSCOPY W/ BIOPSIES     CYSTOSCOPY N/A 03/04/2022   Procedure: CYSTOSCOPY;  Surgeon: Izell Harari, MD;  Location: [facility] OR;  Service: Gynecology;  Laterality: N/A;   EAR TUBE REMOVAL     ENDOMETRIAL BIOPSY  12/23/2021   ESOPHAGOGASTRODUODENOSCOPY     LAPAROSCOPIC CHOLECYSTECTOMY  6/09   Dr. Kimble   LAPAROSCOPIC HYSTERECTOMY     TOTAL LAPAROSCOPIC HYSTERECTOMY WITH SALPINGECTOMY Bilateral 03/04/2022   Procedure: TOTAL LAPAROSCOPIC HYSTERECTOMY WITH SALPINGECTOMY;  Surgeon: Izell Harari, MD;  Location: [facility] OR;  Service: Gynecology;  Laterality: Bilateral;   TUBAL LIGATION  2017   TYMPANOSTOMY TUBE PLACEMENT      Family History  Problem Relation Age of Onset   Colon polyps Maternal Grandfather    Parkinson's disease Maternal Grandfather    Breast cancer Mother 67   Rheum arthritis Mother    Irritable bowel syndrome Mother    Asthma Father    Anxiety  disorder Father        Severe, possible bipolar   Irritable bowel syndrome Maternal Grandmother    Diabetes Other        mat. great uncles   Alzheimer's disease Other        Dad's side   Colon cancer Other        mat GGF    Social History   Socioeconomic History   Marital status: Legally Separated    Spouse name: Not on file   Number of children: 1   Years of education: Not on file   Highest education level: Not on file  Occupational History   Occupation: Accounting    Comment: [employer]  Tobacco Use   Smoking status: Never    Passive exposure: Past   Smokeless tobacco: Never  Vaping Use   Vaping status: Never Used  Substance and Sexual Activity   Alcohol use: No    Alcohol/week: 0.0 standard drinks of alcohol   Drug use: No   Sexual activity: Yes    Birth control/protection: Surgical  Other Topics Concern   Not on file  Social History Narrative   Married 10/22   Social Drivers of Health   Financial Resource Strain: Low Risk  (11/25/2023)   Received from [facility]   Overall Financial Resource Strain (CARDIA)    Difficulty of Paying Living Expenses: Not hard at all  Food Insecurity: No Food Insecurity (11/25/2023)   Received from [facility]   Hunger Vital Sign    Within the past 12 months, you worried that your food would run out before you got the money to buy more.: Never true    Within the past 12 months, the food you bought just didn't last and you didn't have money to get more.: Never true  Transportation Needs: No Transportation Needs (11/25/2023)   Received from [facility] - Transportation    In the past 12 months, has lack of transportation kept you from medical appointments or from getting medications?: No    Lack of Transportation (Non-Medical): No  Physical Activity: Not on file  Stress: Not on file  Social Connections: Not on file  Intimate Partner Violence: Not on file   Review of  Systems Sleeping fairly well Appetite is down some Has been constipated---bad IBS. Needs to see GI again     Objective:   Physical Exam Constitutional:      Appearance: Normal appearance.  Neurological:     Mental Status: She is alert.  Psychiatric:        Mood and Affect: Mood normal.        Behavior: Behavior normal.  Assessment & Plan:

## 2024-01-11 NOTE — Assessment & Plan Note (Signed)
 Much better since on the depakote  Rarely using the xanax  now

## 2024-01-12 LAB — VALPROIC ACID LEVEL: Valproic Acid Lvl: 64.1 mg/L (ref 50.0–100.0)

## 2024-01-15 ENCOUNTER — Telehealth: Payer: Self-pay

## 2024-01-15 ENCOUNTER — Encounter: Payer: Self-pay | Admitting: Internal Medicine

## 2024-01-15 NOTE — Telephone Encounter (Signed)
 Called and spoke to pt. She did not discuss an issue with Dr Jimmy at her OV 01-11-24. I explained that for insurance and scheduling purposes, she has to be seen and have the area evaluated so proper documentation can be done. Made her an appt for 01-18-24.

## 2024-01-15 NOTE — Telephone Encounter (Signed)
 Copied from CRM [id number]. Topic: Clinical - Medical Advice >> Jan 15, 2024  8:07 AM Bridget Maldonado wrote: Reason for CRM: Patient needs to be scheduled for a diagnostic mammogram//Please call to let patient know once complete

## 2024-01-18 ENCOUNTER — Encounter: Payer: Self-pay | Admitting: Internal Medicine

## 2024-01-18 ENCOUNTER — Ambulatory Visit: Admitting: Internal Medicine

## 2024-01-18 VITALS — BP 130/80 | HR 86 | Temp 97.8°F | Ht 63.5 in | Wt 228.0 lb

## 2024-01-18 DIAGNOSIS — N644 Mastodynia: Secondary | ICD-10-CM | POA: Diagnosis not present

## 2024-01-18 NOTE — Assessment & Plan Note (Signed)
 I don't see worrisome findings but will go ahead with diagnostic mammogram and U/S

## 2024-01-18 NOTE — Progress Notes (Signed)
 Subjective:    Patient ID: Bridget Maldonado, female    DOB: 07-May-1981, 43 y.o.   MRN: [id number]  HPI Here due to a right breast lump and indentation  Has noticed a knot and discolored spot in side of right breast Knot goes back to May---but didn't think it was serious Hasn't gone away---and now having some breast pain Now notices change in color and indentation  Current Outpatient Medications on File Prior to Visit  Medication Sig Dispense Refill   albuterol  (PROVENTIL  HFA;VENTOLIN  HFA) 108 (90 Base) MCG/ACT inhaler Inhale 2 puffs into the lungs every 6 (six) hours as needed for wheezing or shortness of breath. 1 Inhaler 1   ALPRAZolam  (XANAX ) 0.25 MG tablet Take 1 tablet (0.25 mg total) by mouth 3 (three) times daily as needed for anxiety. 60 tablet 0   aspirin  EC 81 MG tablet Take 81 mg by mouth daily. Swallow whole.     atorvastatin  (LIPITOR) 20 MG tablet Take 1 tablet (20 mg total) by mouth daily. 90 tablet 3   divalproex  (DEPAKOTE ) 500 MG DR tablet Take 1 tablet (500 mg total) by mouth 2 (two) times daily. 60 tablet 3   lisinopril -hydrochlorothiazide  (ZESTORETIC ) 20-12.5 MG tablet Take 1 tablet by mouth daily. 90 tablet 3   mometasone  (ELOCON ) 0.1 % ointment APPLY TWICE DAILY AS NEEDED FOR PSORIASIS 45 g 1   nortriptyline (PAMELOR) 10 MG capsule Take 10 mg by mouth 2 (two) times daily.     ondansetron  (ZOFRAN -ODT) 4 MG disintegrating tablet Take 1 tablet (4 mg total) by mouth every 8 (eight) hours as needed. 20 tablet 0   prochlorperazine  (COMPAZINE ) 10 MG tablet Take 1 tablet (10 mg total) by mouth 2 (two) times daily as needed for nausea or vomiting. 20 tablet 0   traMADol  (ULTRAM ) 50 MG tablet Take 1 tablet (50 mg total) by mouth 2 (two) times daily as needed. 30 tablet 0   No current facility-administered medications on file prior to visit.    Allergies  Allergen Reactions   Sulfa  Antibiotics Nausea And Vomiting   Cipro [Ciprofloxacin Hcl] Nausea And Vomiting   Effexor  [Venlafaxine Hcl] Other (See Comments)    Excessive sleeping, slept for 2 days    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   Increased risk of breast cancer 07/2017   IBIS=40%   Kidney infection    Pylonephritis when pregnant   LGSIL on Pap smear of cervix    Migraines    Obese    Psoriatic arthritis (HCC)    Secondary oligomenorrhea 07/16/2017    Past Surgical History:  Procedure Laterality Date   BREAST BIOPSY Left 8 years   CESAREAN SECTION     COLONOSCOPY     COLONOSCOPY W/ BIOPSIES     CYSTOSCOPY N/A 03/04/2022   Procedure: CYSTOSCOPY;  Surgeon: Izell Harari, MD;  Location: [facility] OR;  Service: Gynecology;  Laterality: N/A;   EAR TUBE REMOVAL     ENDOMETRIAL BIOPSY  12/23/2021   ESOPHAGOGASTRODUODENOSCOPY  LAPAROSCOPIC CHOLECYSTECTOMY  6/09   Dr. Kimble   LAPAROSCOPIC HYSTERECTOMY     TOTAL LAPAROSCOPIC HYSTERECTOMY WITH SALPINGECTOMY Bilateral 03/04/2022   Procedure: TOTAL LAPAROSCOPIC HYSTERECTOMY WITH SALPINGECTOMY;  Surgeon: Izell Harari, MD;  Location: [facility] OR;  Service: Gynecology;  Laterality: Bilateral;   TUBAL LIGATION  2017   TYMPANOSTOMY TUBE PLACEMENT      Family History  Problem Relation Age of Onset   Colon polyps Maternal Grandfather    Parkinson's disease Maternal Grandfather    Breast cancer Mother 70   Rheum arthritis Mother    Irritable bowel syndrome Mother    Asthma Father    Anxiety disorder Father        Severe, possible bipolar   Irritable bowel syndrome Maternal Grandmother    Diabetes Other        mat. great uncles   Alzheimer's  disease Other        Dad's side   Colon cancer Other        mat GGF    Social History   Socioeconomic History   Marital status: Legally Separated    Spouse name: Not on file   Number of children: 1   Years of education: Not on file   Highest education level: Not on file  Occupational History   Occupation: Accounting    Comment: [employer]  Tobacco Use   Smoking status: Never    Passive exposure: Past   Smokeless tobacco: Never  Vaping Use   Vaping status: Never Used  Substance and Sexual Activity   Alcohol use: No    Alcohol/week: 0.0 standard drinks of alcohol   Drug use: No   Sexual activity: Yes    Birth control/protection: Surgical  Other Topics Concern   Not on file  Social History Narrative   Married 10/22   Social Drivers of Health   Financial Resource Strain: Low Risk  (11/25/2023)   Received from [facility]   Overall Financial Resource Strain (CARDIA)    Difficulty of Paying Living Expenses: Not hard at all  Food Insecurity: No Food Insecurity (11/25/2023)   Received [facility]   Hunger Vital Sign    Within the past 12 months, you worried that your food would run out before you got the money to buy more.: Never true    Within the past 12 months, the food you bought just didn't last and you didn't have money to get more.: Never true  Transportation Needs: No Transportation Needs (11/25/2023)   Received from [facility] - Transportation    In the past 12 months, has lack of transportation kept you from medical appointments or from getting medications?: No    Lack of Transportation (Non-Medical): No  Physical Activity: Not on file  Stress: Not on file  Social Connections: Not on file  Intimate Partner Violence: Not on file   Review of Systems Feels the knot is in axilla---and indentation/color change is in breast No nipple discharge     Objective:   Physical Exam Genitourinary:     Comments: Pendulous breasts with moderate bilateral cystic changes throughout No obvious indentation or color changes No axillary mass or nodes           Assessment & Plan:

## 2024-01-22 ENCOUNTER — Ambulatory Visit
Admission: RE | Admit: 2024-01-22 | Discharge: 2024-01-22 | Disposition: A | Discharge status: Home / Self Care | Source: Ambulatory Visit | Attending: Internal Medicine | Admitting: Internal Medicine

## 2024-01-22 DIAGNOSIS — N644 Mastodynia: Secondary | ICD-10-CM

## 2024-01-23 ENCOUNTER — Ambulatory Visit: Payer: Self-pay | Admitting: Internal Medicine

## 2024-01-26 ENCOUNTER — Telehealth: Payer: Self-pay

## 2024-01-26 NOTE — Telephone Encounter (Signed)
 No action needed.

## 2024-01-26 NOTE — Telephone Encounter (Signed)
 Copied from CRM #[id number]. Topic: Appointments - Transfer of Care >> Jan 26, 2024 10:13 AM Henretta I wrote: Pt is requesting to transfer FROM: Dr. Charlie Denise  Pt is requesting to transfer TO:  Dr. Reuben Burkes  Reason for requested transfer: Dr. Denise is retiring  It is the responsibility of the team the patient would like to transfer to (Dr. Reuben Burkes ) to reach out to the patient if for any reason this transfer is not acceptable.

## 2024-02-01 ENCOUNTER — Encounter: Payer: Self-pay | Admitting: Internal Medicine

## 2024-02-01 MED ORDER — TRAMADOL HCL 50 MG PO TABS: 50.0000 mg | ORAL_TABLET | Freq: Two times a day (BID) | ORAL | 0 refills | Status: DC | PRN

## 2024-02-01 NOTE — Telephone Encounter (Signed)
 Last written 12-28-23 #30 Last OV 01-18-24 Next OV 09-13-24 [facility]

## 2024-02-03 ENCOUNTER — Other Ambulatory Visit: Payer: Self-pay | Admitting: Internal Medicine

## 2024-02-08 ENCOUNTER — Ambulatory Visit

## 2024-02-08 ENCOUNTER — Encounter: Payer: Self-pay | Admitting: Internal Medicine

## 2024-02-08 MED ORDER — ALPRAZOLAM 0.25 MG PO TABS: 0.2500 mg | ORAL_TABLET | Freq: Three times a day (TID) | ORAL | 0 refills | Status: DC | PRN

## 2024-02-08 NOTE — Telephone Encounter (Signed)
 Last OV: 01/18/24 Pending OV: 09/13/24 - Dr. Bennett Medication: Alprazolam  0.25mg   Directions: Take one tablet TID prn Last Refill: 12/07/23 Qty: #60 with 0 refills

## 2024-02-26 ENCOUNTER — Other Ambulatory Visit: Payer: Self-pay

## 2024-02-26 MED ORDER — TRAMADOL HCL 50 MG PO TABS: 50.0000 mg | ORAL_TABLET | Freq: Two times a day (BID) | ORAL | 0 refills | Status: DC | PRN

## 2024-02-26 NOTE — Telephone Encounter (Signed)
 Copied from CRM [id number]. Topic: Clinical - Medication Refill >> Feb 26, 2024 10:58 AM Tanazia G wrote: Medication:  traMADol  (ULTRAM ) 50 MG tablet     Has the patient contacted their pharmacy? Yes (Agent: If no, request that the patient contact the pharmacy for the refill. If patient does not wish to contact the pharmacy document the reason why and proceed with request.) (Agent: If yes, when and what did the pharmacy advise?)  This is the patient's preferred pharmacy:  [facility] - [place], [place] - [address] [place] [address] Phone: [phone] Fax: [phone]  [id number] [place], [place] - [address] [place] [address] Phone: [phone] Fax: [phone]  Is this the correct pharmacy for this prescription? Yes If no, delete pharmacy and type the correct one.   Has the prescription been filled recently? Yes  Is the patient out of the medication? Yes  Has the patient been seen for an appointment in the last year OR does the patient have an upcoming appointment? Yes  Can we respond through MyChart? Yes  Agent: Please be advised that Rx refills may take up to 3 business days. We ask that you follow-up with your pharmacy.

## 2024-03-03 NOTE — Telephone Encounter (Signed)
 Med has been refilled.

## 2024-03-11 ENCOUNTER — Other Ambulatory Visit

## 2024-03-11 ENCOUNTER — Ambulatory Visit: Admitting: Gastroenterology

## 2024-03-11 ENCOUNTER — Encounter: Payer: Self-pay | Admitting: Gastroenterology

## 2024-03-11 VITALS — BP 132/80 | HR 82 | Ht 63.5 in | Wt 227.0 lb

## 2024-03-11 DIAGNOSIS — R1010 Upper abdominal pain, unspecified: Secondary | ICD-10-CM

## 2024-03-11 DIAGNOSIS — R140 Abdominal distension (gaseous): Secondary | ICD-10-CM

## 2024-03-11 DIAGNOSIS — R1084 Generalized abdominal pain: Secondary | ICD-10-CM

## 2024-03-11 DIAGNOSIS — Z8379 Family history of other diseases of the digestive system: Secondary | ICD-10-CM

## 2024-03-11 DIAGNOSIS — R110 Nausea: Secondary | ICD-10-CM

## 2024-03-11 DIAGNOSIS — R194 Change in bowel habit: Secondary | ICD-10-CM | POA: Diagnosis not present

## 2024-03-11 DIAGNOSIS — R197 Diarrhea, unspecified: Secondary | ICD-10-CM

## 2024-03-11 LAB — CBC WITH DIFFERENTIAL/PLATELET
Basophils Absolute: 0 K/uL (ref 0.0–0.1)
Basophils Relative: 0.7 % (ref 0.0–3.0)
Eosinophils Absolute: 0.2 K/uL (ref 0.0–0.7)
Eosinophils Relative: 5 % (ref 0.0–5.0)
HCT: 42.6 % (ref 36.0–46.0)
Hemoglobin: 14.3 g/dL (ref 12.0–15.0)
Lymphocytes Relative: 37.3 % (ref 12.0–46.0)
Lymphs Abs: 1.6 K/uL (ref 0.7–4.0)
MCHC: 33.6 g/dL (ref 30.0–36.0)
MCV: 84 fl (ref 78.0–100.0)
Monocytes Absolute: 0.3 K/uL (ref 0.1–1.0)
Monocytes Relative: 6.1 % (ref 3.0–12.0)
Neutro Abs: 2.2 K/uL (ref 1.4–7.7)
Neutrophils Relative %: 50.9 % (ref 43.0–77.0)
Platelets: 198 K/uL (ref 150.0–400.0)
RBC: 5.08 Mil/uL (ref 3.87–5.11)
RDW: 13.7 % (ref 11.5–15.5)
WBC: 4.4 K/uL (ref 4.0–10.5)

## 2024-03-11 LAB — COMPREHENSIVE METABOLIC PANEL WITH GFR
ALT: 14 U/L (ref 0–35)
AST: 16 U/L (ref 0–37)
Albumin: 4 g/dL (ref 3.5–5.2)
Alkaline Phosphatase: 98 U/L (ref 39–117)
BUN: 12 mg/dL (ref 6–23)
CO2: 30 meq/L (ref 19–32)
Calcium: 9.1 mg/dL (ref 8.4–10.5)
Chloride: 103 meq/L (ref 96–112)
Creatinine, Ser: 0.66 mg/dL (ref 0.40–1.20)
GFR: 109.16 mL/min (ref >60.00)
Glucose, Bld: 85 mg/dL (ref 70–99)
Potassium: 4 meq/L (ref 3.5–5.1)
Sodium: 140 meq/L (ref 135–145)
Total Bilirubin: 0.5 mg/dL (ref 0.2–1.2)
Total Protein: 6.9 g/dL (ref 6.0–8.3)

## 2024-03-11 LAB — C-REACTIVE PROTEIN: CRP: <1 mg/dL (ref 0.5–20.0)

## 2024-03-11 LAB — TSH: TSH: 2.08 u[IU]/mL (ref 0.35–5.50)

## 2024-03-11 LAB — LIPASE: Lipase: 20 U/L (ref 11.0–59.0)

## 2024-03-11 LAB — SEDIMENTATION RATE: Sed Rate: 12 mm/h (ref 0–20)

## 2024-03-11 NOTE — Patient Instructions (Addendum)
 _______________________________________________________  If your blood pressure at your visit was 140/90 or greater, please contact your primary care physician to follow up on this.  _______________________________________________________  If you are age 41 or older, your body mass index should be between 23-30. Your Body mass index is 39.58 kg/m. If this is out of the aforementioned range listed, please consider follow up with your Primary Care Provider.  If you are age 83 or younger, your body mass index should be between 19-25. Your Body mass index is 39.58 kg/m. If this is out of the aformentioned range listed, please consider follow up with your Primary Care Provider.   ________________________________________________________  The [facility] providers would like to encourage you to use MYCHART to communicate with providers for non-urgent requests or questions.  Due to long hold times on the telephone, sending your provider a message by [facility] may be a faster and more efficient way to get a response.  Please allow 48 business hours for a response.  Please remember that this is for non-urgent requests.  _______________________________________________________  [facility] is using a team-based approach to care.  Your team is made up of your doctor and two to three APPS. Our APPS (Nurse Practitioners and Physician Assistants) work with your physician to ensure care continuity for you. They are fully qualified to address your health concerns and develop a treatment plan. They communicate directly with your gastroenterologist to care for you. Seeing the Advanced Practice Practitioners on your physician's team can help you by facilitating care more promptly, often allowing for earlier appointments, access to diagnostic testing, procedures, and other specialty referrals.   Your provider has requested that you go to the basement level for lab work before leaving today. Press B on the  elevator. The lab is located at the first door on the left as you exit the elevator.  Please purchase the following medications over the counter and take as directed: START: Benefiber 1 tablespoon daily  You have been scheduled for a CT scan of the abdomen and pelvis at [facility], 1st floor Radiology. You are scheduled on 03-17-24 at 3:30pm. You should arrive at 1:15pm for registration and to drink contrast.   Please follow the written instructions below on the day of your exam:   1) Do not eat anything after 11:30am (4 hours prior to your test)   You may take any medications as prescribed with a small amount of water , if necessary. If you take any of the following medications: METFORMIN, GLUCOPHAGE, GLUCOVANCE, AVANDAMET, RIOMET, FORTAMET, ACTOPLUS MET, JANUMET, GLUMETZA or METAGLIP, you MAY be asked to HOLD this medication 48 hours AFTER the exam.   The purpose of you drinking the oral contrast is to aid in the visualization of your intestinal tract. The contrast solution may cause some diarrhea. Depending on your individual set of symptoms, you may also receive an intravenous injection of x-ray contrast/dye. Plan on being at [facility] for 45 minutes or longer, depending on the type of exam you are having performed.   If you have any questions regarding your exam or if you need to reschedule, you may call [facility] at [phone] between the hours of 8:00 am and 5:00 pm, Monday-Friday.   Due to recent changes in healthcare laws, you may see the results of your imaging and laboratory studies on MyChart before your provider has had a chance to review them.  We understand that in some cases there may be results that are confusing or  concerning to you. Not all laboratory results come back in the same time frame and the provider may be waiting for multiple results in order to interpret others.  Please give us  48 hours in order for your provider to thoroughly review all the  results before contacting the office for clarification of your results.   Low-FODMAP Eating Plan  FODMAP stands for fermentable oligosaccharides, disaccharides, monosaccharides, and polyols. These are sugars that are hard for some people to digest. A low-FODMAP eating plan may help some people who have irritable bowel syndrome (IBS) and certain other bowel (intestinal) diseases to manage their symptoms. This meal plan can be complicated to follow. Work with a diet and nutrition specialist (dietitian) to make a low-FODMAP eating plan that is right for you. A dietitian can help make sure that you get enough nutrition from this diet. What are tips for following this plan? Reading food labels Check labels for hidden FODMAPs such as: High-fructose syrup. Honey. Agave. Natural fruit flavors. Onion or garlic powder. Choose low-FODMAP foods that contain 3-4 grams of fiber per serving. Check food labels for serving sizes. Eat only one serving at a time to make sure FODMAP levels stay low. Shopping Shop with a list of foods that are recommended on this diet and make a meal plan. Meal planning Follow a low-FODMAP eating plan for up to 6 weeks, or as told by your health care provider or dietitian. To follow the eating plan: Eliminate high-FODMAP foods from your diet completely. Choose only low-FODMAP foods to eat. You will do this for 2-6 weeks. Gradually reintroduce high-FODMAP foods into your diet one at a time. Most people should wait a few days before introducing the next new high-FODMAP food into their meal plan. Your dietitian can recommend how quickly you may reintroduce foods. Keep a daily record of what and how much you eat and drink. Make note of any symptoms that you have after eating. Review your daily record with a dietitian regularly to identify which foods you can eat and which foods you should avoid. General tips Drink enough fluid each day to keep your urine pale yellow. Avoid  processed foods. These often have added sugar and may be high in FODMAPs. Avoid most dairy products, whole grains, and sweeteners. Work with a dietitian to make sure you get enough fiber in your diet. Avoid high FODMAP foods at meals to manage symptoms. Recommended foods Fruits Bananas, oranges, tangerines, lemons, limes, blueberries, raspberries, strawberries, grapes, cantaloupe, honeydew melon, kiwi, papaya, passion fruit, and pineapple. Limited amounts of dried cranberries, banana chips, and shredded coconut. Vegetables Eggplant, zucchini, cucumber, peppers, green beans, bean sprouts, lettuce, arugula, kale, Swiss chard, spinach, collard greens, bok choy, summer squash, potato, and tomato. Limited amounts of corn, carrot, and sweet potato. Green parts of scallions. Grains Gluten-free grains, such as rice, oats, buckwheat, quinoa, corn, polenta, and millet. Gluten-free pasta, bread, or cereal. Rice noodles. Corn tortillas. Meats and other proteins Unseasoned beef, pork, poultry, or fish. Eggs. [place]. Tofu (firm) and tempeh. Limited amounts of nuts and seeds, such as almonds, walnuts, [place] nuts, pecans, peanuts, nut butters, pumpkin seeds, chia seeds, and sunflower seeds. Dairy Lactose-free milk, yogurt, and kefir. Lactose-free cottage cheese and ice cream. Non-dairy milks, such as almond, coconut, hemp, and rice milk. Non-dairy yogurt. Limited amounts of goat cheese, brie, mozzarella, parmesan, swiss, and other hard cheeses. Fats and oils Butter-free spreads. Vegetable oils, such as olive, canola, and sunflower oil. Seasoning and other foods Artificial sweeteners with names that  do not end in ol, such as aspartame, saccharine, and stevia. Maple syrup, white table sugar, raw sugar, brown sugar, and molasses. Mayonnaise, soy sauce, and tamari. Fresh basil, coriander, parsley, rosemary, and thyme. Beverages Water  and mineral water . Sugar-sweetened soft drinks. Small amounts of orange juice or  cranberry juice. Black and green tea. Most dry wines. Coffee. The items listed above may not be a complete list of foods and beverages you can eat. Contact a dietitian for more information. Foods to avoid Fruits Fresh, dried, and juiced forms of apple, pear, watermelon, peach, plum, cherries, apricots, blackberries, boysenberries, figs, nectarines, and mango. Avocado. Vegetables Chicory root, artichoke, asparagus, cabbage, snow peas, Brussels sprouts, broccoli, sugar snap peas, mushrooms, celery, and cauliflower. Onions, garlic, leeks, and the white part of scallions. Grains Wheat, including kamut, durum, and semolina. Barley and bulgur. Couscous. Wheat-based cereals. Wheat noodles, bread, crackers, and pastries. Meats and other proteins Fried or fatty meat. Sausage. Cashews and pistachios. Soybeans, baked beans, black beans, chickpeas, kidney beans, fava beans, navy beans, lentils, black-eyed peas, and split peas. Dairy Milk, yogurt, ice cream, and soft cheese. Cream and sour cream. Milk-based sauces. Custard. Buttermilk. Soy milk. Seasoning and other foods Any sugar-free gum or candy. Foods that contain artificial sweeteners such as sorbitol, mannitol, isomalt, or xylitol. Foods that contain honey, high-fructose corn syrup, or agave. Bouillon, vegetable stock, beef stock, and chicken stock. Garlic and onion powder. Condiments made with onion, such as hummus, chutney, pickles, relish, salad dressing, and salsa. Tomato paste. Beverages Chicory-based drinks. Coffee substitutes. Chamomile tea. Fennel tea. Sweet or fortified wines such as port or sherry. Diet soft drinks made with isomalt, mannitol, maltitol, sorbitol, or xylitol. Apple, pear, and mango juice. Juices with high-fructose corn syrup. The items listed above may not be a complete list of foods and beverages you should avoid. Contact a dietitian for more information. Summary FODMAP stands for fermentable oligosaccharides, disaccharides,  monosaccharides, and polyols. These are sugars that are hard for some people to digest. A low-FODMAP eating plan is a short-term diet that helps to ease symptoms of certain bowel diseases. The eating plan usually lasts up to 6 weeks. After that, high-FODMAP foods are reintroduced gradually and one at a time. This can help you find out which foods may be causing symptoms. A low-FODMAP eating plan can be complicated. It is best to work with a dietitian who has experience with this type of plan. This information is not intended to replace advice given to you by your health care provider. Make sure you discuss any questions you have with your health care provider. Document Revised: 05/24/2023 Document Reviewed: 05/24/2023 Elsevier Patient Education  2025 [employer].  Thank you for entrusting me with your care and choosing [facility].  Camie Furbish, PA-C

## 2024-03-11 NOTE — Progress Notes (Signed)
 [name] [facility] [id number] 1981-02-16   Chief Complaint: Constipation and diarrhea  Referring Provider: Jimmy Charlie FERNS, MD Primary GI MD: Dr. Avram  HPI: Bridget Maldonado is a 43 y.o. female with past medical history of anxiety, arthritis, asthma, bipolar disorder, DUB, endometriosis, HTN, IBS, migraines, cholecystectomy, hysterectomy who presents today for a complaint of IBS with constipation.    Last seen in office 09/28/2012 by Dr. Avram for follow-up of IBS.  At that time had started hyoscyamine  but still having fairly significant cramps at times. Started on Lotronex .   Patient with multiple GI complaints today.  States that she has been having a lot of abdominal pain, particularly in her upper abdomen.  Has been getting really sharp pains in her RUQ (has history of cholecystectomy), which feels similar to previous pain she experienced with her gallbladder.  Can have epigastric/LUQ pain that radiates to RUQ.  States she feels bloated all the time and this has worsened over the last couple of months to the point that she has difficulty putting on her pants.  Her stomach often feels hard and distended.  She has alternating bowel habits.  Over the last couple months has not had any normal stools.  Stools are often loose and can be watery.  Also endorses having similar symptoms prior to having her gallbladder removed.  When she does have a more formed stool it is very thin.  Can a bowel movement 5-6 times daily, in the last 20-month stools have always been loose.  Sometimes has significant urgency and has come close to having incontinence.  Urgency worse after meals.  She reports a history of IBS she states that she has not had symptoms this severe in a very long time.  She feels lethargic and uncomfortable constantly.  Always feels full.  Denies any blood in her stool or melena.  Not having any rectal pain.  Abdominal pain and bloating sometimes improves after a bowel movement but not  always.  Prior to onset of loose stools, could go a week without a bowel movement.  Has not had issues with constipation recently.  Reports that she has gained 10 to 15 pounds but denies any other lifestyle changes which could have led to this.  Sometimes feels nauseated but denies any vomiting.  Rare acid reflux, taking Tums every now and then.  Denies any dysphagia.  Denies diabetes or history of pancreatitis.  No recent abdominal imaging.  Orts that her grandmother has Crohn's disease, her mother has history of diverticulitis.  Great grandfather had colon cancer.  Denies any thyroid  dysfunction.  Has not tried any treatments for diarrhea over-the-counter as she is afraid to make her problems worse.  Has tried to eat more fruits and vegetables.  Endorses that she does eat a lot of dairy, particularly cheese.   Previous GI Procedures/Imaging   Colonoscopy 04/06/2012 Normal colon, multiple random biopsies of the area were performed Normal mucosa in the terminal ileum Small internal hemorrhoids Path: Surgical [P], random colon, bx - HISTOLOGICALLY NORMAL COLONIC MUCOSA. - NEGATIVE FOR MORPHOLOGIC FEATURES OF IDIOPATHIC INFLAMMATORY BOWEL DISEASE, MICROSCOPIC COLITIS OR INFECTIOUS COLITIS. - NEGATIVE FOR DYSPLASIA.  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   Increased risk of breast cancer 07/2017   IBIS=40%   Kidney infection    Pylonephritis when pregnant   LGSIL on Pap smear of cervix    Migraines    Obese    Psoriatic arthritis (HCC)    Secondary  oligomenorrhea 07/16/2017    Past Surgical History:  Procedure Laterality Date   BREAST BIOPSY Left 8 years   CESAREAN SECTION     COLONOSCOPY     COLONOSCOPY W/ BIOPSIES     CYSTOSCOPY N/A 03/04/2022   Procedure: CYSTOSCOPY;  Surgeon: Izell Harari, MD;  Location: [facility] OR;  Service: Gynecology;  Laterality: N/A;   EAR TUBE REMOVAL     ENDOMETRIAL BIOPSY  12/23/2021   ESOPHAGOGASTRODUODENOSCOPY     LAPAROSCOPIC CHOLECYSTECTOMY  6/09   Dr. Kimble   LAPAROSCOPIC HYSTERECTOMY     TOTAL LAPAROSCOPIC HYSTERECTOMY WITH SALPINGECTOMY Bilateral 03/04/2022   Procedure: TOTAL LAPAROSCOPIC HYSTERECTOMY WITH SALPINGECTOMY;  Surgeon: Izell Harari, MD;  Location: [facility] OR;  Service: Gynecology;  Laterality: Bilateral;   TUBAL LIGATION  2017   TYMPANOSTOMY TUBE PLACEMENT      Current Outpatient Medications  Medication Sig Dispense Refill   albuterol  (VENTOLIN  HFA) 108 (90 Base) MCG/ACT inhaler INHALE TWO INHALATIONS INTO THE LUNGS EVERY FOUR (FOUR) HOURS AS NEEDED 6.7 g 1   ALPRAZolam  (XANAX ) 0.25 MG tablet Take 1 tablet (0.25 mg total) by mouth 3 (three) times daily as needed for anxiety. 60 tablet 0   aspirin  EC 81 MG tablet Take 81 mg by mouth daily. Swallow whole.     atorvastatin  (LIPITOR) 20 MG tablet Take 1 tablet (20 mg total) by mouth daily. 90 tablet 3   divalproex  (DEPAKOTE ) 500 MG DR tablet Take 1 tablet (500 mg total) by mouth 2 (two) times daily. 60 tablet 3   lisinopril -hydrochlorothiazide  (ZESTORETIC ) 20-12.5 MG tablet Take 1 tablet by mouth daily. 90 tablet 3   mometasone  (ELOCON ) 0.1 % ointment APPLY TWICE DAILY AS NEEDED FOR PSORIASIS 45 g 1   prochlorperazine  (COMPAZINE ) 10 MG tablet Take 1 tablet (10 mg total) by mouth 2 (two) times daily as needed for nausea or vomiting. 20 tablet 0   traMADol  (ULTRAM ) 50 MG tablet Take 1 tablet (50 mg total) by mouth 2 (two) times daily as needed. 30 tablet 0   nortriptyline (PAMELOR) 10 MG capsule Take 10 mg by mouth 2 (two) times daily.      ondansetron  (ZOFRAN -ODT) 4 MG disintegrating tablet Take 1 tablet (4 mg total) by mouth every 8 (eight) hours as needed. 20 tablet 0   No current facility-administered medications for this visit.    Allergies as of 03/11/2024 - Review Complete 03/11/2024  Allergen Reaction Noted   Sulfa  antibiotics Nausea And Vomiting 11/15/2014   Cipro [ciprofloxacin hcl] Nausea And Vomiting    Effexor [venlafaxine hcl] Other (See Comments) 03/31/2012    Family History  Problem Relation Age of Onset   Colon polyps Maternal Grandfather    Parkinson's disease Maternal Grandfather    Breast cancer Mother 5   Rheum arthritis Mother    Irritable bowel syndrome Mother    Asthma Father    Anxiety disorder Father        Severe, possible bipolar   Irritable bowel syndrome Maternal Grandmother    Diabetes Other  mat. great uncles   Alzheimer's disease Other        Dad's side   Colon cancer Other        mat GGF    Social History   Tobacco Use   Smoking status: Never    Passive exposure: Past   Smokeless tobacco: Never  Vaping Use   Vaping status: Never Used  Substance Use Topics   Alcohol use: No    Alcohol/week: 0.0 standard drinks of alcohol   Drug use: No     Review of Systems:    Constitutional: No weight loss, fever, chills.  Positive fatigue Cardiovascular: No chest pain Respiratory: No SOB  Gastrointestinal: See HPI and otherwise negative Hematologic: No bleeding     Physical Exam:  Vital signs: BP 132/80   Pulse 82   Ht 5' 3.5 (1.613 m)   Wt 227 lb (103 kg)   LMP 10/21/2021 (Approximate) Comment: Urine preg negatvie 03/04/22  BMI 39.58 kg/m   Wt Readings from Last 3 Encounters:  03/11/24 227 lb (103 kg)  01/18/24 228 lb (103.4 kg)  01/11/24 228 lb (103.4 kg)     Constitutional: Pleasant, obese female in NAD, alert and cooperative Head:  Normocephalic and atraumatic.  Eyes: No scleral icterus.  Respiratory: Respirations even and unlabored. Lungs clear to  auscultation bilaterally.  No wheezes, crackles, or rhonchi.  Cardiovascular:  Regular rate and rhythm. No murmurs. No peripheral edema. Gastrointestinal:  Soft, obese, distended, tender to palpation of epigastrium and RUQ. No rebound or guarding. Normal bowel sounds. No appreciable masses or hepatomegaly. Rectal:  Not performed.  Neurologic:  Alert and oriented x4;  grossly normal neurologically.  Skin:   Dry and intact without significant lesions or rashes. Psychiatric: Oriented to person, place and time. Demonstrates good judgement and reason without abnormal affect or behaviors.   RELEVANT LABS AND IMAGING: CBC    Component Value Date/Time   WBC 4.9 01/11/2024 0826   RBC 5.03 01/11/2024 0826   HGB 14.0 01/11/2024 0826   HGB 14.1 08/25/2014 0000   HCT 42.6 01/11/2024 0826   HCT 42 08/25/2014 0000   PLT 220.0 01/11/2024 0826   PLT 238 08/25/2014 0000   MCV 84.6 01/11/2024 0826   MCV 86 07/11/2013 1844   MCH 27.8 11/24/2023 1131   MCHC 32.9 01/11/2024 0826   RDW 14.0 01/11/2024 0826   RDW 13.7 07/11/2013 1844   LYMPHSABS 2.5 11/24/2023 1131   MONOABS 0.4 11/24/2023 1131   EOSABS 0.2 11/24/2023 1131   BASOSABS 0.1 11/24/2023 1131   BASOSABS 1 07/11/2013 1844    CMP     Component Value Date/Time   NA 139 01/11/2024 0826   NA 138 07/11/2013 1844   K 4.2 01/11/2024 0826   K 3.3 (L) 07/11/2013 1844   CL 102 01/11/2024 0826   CL 105 07/11/2013 1844   CO2 30 01/11/2024 0826   CO2 28 07/11/2013 1844   GLUCOSE 86 01/11/2024 0826   GLUCOSE 89 07/11/2013 1844   BUN 15 01/11/2024 0826   BUN 12 07/11/2013 1844   CREATININE 0.70 01/11/2024 0826   CREATININE 0.79 07/11/2013 1844   CALCIUM  8.8 01/11/2024 0826   CALCIUM  8.9 07/11/2013 1844   PROT 7.0 01/11/2024 0826   PROT 7.7 07/11/2013 1844   ALBUMIN 3.8 01/11/2024 0826   ALBUMIN 3.3 (L) 07/11/2013 1844   AST 17 01/11/2024 0826   AST 75 (H) 07/11/2013 1844   ALT 12 01/11/2024 0826   ALT 86 (H) 07/11/2013 1844  ALKPHOS  100 01/11/2024 0826   ALKPHOS 134 (H) 07/11/2013 1844   BILITOT 0.4 01/11/2024 0826   BILITOT 0.5 07/11/2013 1844   GFRNONAA >60 11/24/2023 1131   GFRNONAA >60 07/11/2013 1844   GFRAA >60 10/13/2019 1744   GFRAA >60 07/11/2013 1844   Echocardiogram 12/04/2022 1. Left ventricular ejection fraction, by estimation, is 60 to 65% . The left ventricle has normal function. The left ventricle has no regional wall motion abnormalities. Left ventricular diastolic parameters were normal. 2. Right ventricular systolic function is normal. The right ventricular size is normal. There is normal pulmonary artery systolic pressure. The estimated right ventricular systolic pressure is 13. 3 mmHg.  3. The mitral valve is normal in structure. No evidence of mitral valve regurgitation. No evidence of mitral stenosis.  4. The aortic valve is normal in structure. Aortic valve regurgitation is not visualized. No aortic stenosis is present.  5. The inferior vena cava is normal in size with greater than 50% respiratory variability, suggesting right atrial pressure of 3 mmHg.  Assessment/Plan:   Abdominal pain Abdominal bloating Nausea without vomiting Change in bowel habits Diarrhea Family history of Crohn's disease Patient seen today for multiple GI complaints.  History of IBS which she states in the last 2 months seems to be significantly worsened.  Has history of alternating bowel habits with constipation and diarrhea, but in the last 2 months has only been having frequent loose stools, up to 5-6 times daily along with abdominal pain and bloating.  Abdominal pain primarily localized to upper abdomen.  Can have epigastric pain that radiates into her RUQ.  Does have history of cholecystectomy and states that pain is similar to the pain she experienced in the past with her gallbladder. Having some nausea but no vomiting, rare acid reflux.  Denies blood in stool, melena, rectal pain. Does have significant bloating and  distention, and is uncomfortable on exam today.  Reports she is having pain and bloating, along with frequent loose stools daily. Had a colonoscopy in 2013 with finding of small internal hemorrhoids and otherwise normal.  - Order CT A/P for further evaluation of abdominal pain and bloating - Labs today: CBC w/ diff, CMP, ESR, CRP, TSH - Stool studies: C. Diff PCR, stool culture, fecal calprotectin, fecal elastase, stool H. pylori - Discussed trial of low FODMAP diet  -Has been taking fiber Gummies with some improvement, advised switching to powder form of fiber supplementation has Benefiber 1 tablespoon daily  - If above workup negative consider empiric treatment for SIBO - If negative for infection consider Imodium as needed - Consider EGD/colonoscopy   Camie Furbish, PA-C [facility] 03/11/2024, 8:50 AM  Patient Care Team: Bennett Reuben POUR, MD as PCP - General (Family Medicine) Darliss Rogue, MD as PCP - Cardiology (Cardiology) Dessa, Reyes ORN, MD as Consulting Physician (General Surgery) Arloa Lamar SQUIBB, MD as Referring Physician (Obstetrics and Gynecology)

## 2024-03-12 LAB — IGA: Immunoglobulin A: 333 mg/dL — ABNORMAL HIGH (ref 47–310)

## 2024-03-12 LAB — TISSUE TRANSGLUTAMINASE, IGA: (tTG) Ab, IgA: <1 U/mL

## 2024-03-14 ENCOUNTER — Ambulatory Visit: Payer: Self-pay | Admitting: Gastroenterology

## 2024-03-14 MED ORDER — ALPRAZOLAM 0.25 MG PO TABS: 0.2500 mg | ORAL_TABLET | Freq: Three times a day (TID) | ORAL | 0 refills | Status: DC | PRN

## 2024-03-14 NOTE — Telephone Encounter (Signed)
 Last written 02-08-24 #60 Last OV 01-18-24 Next OV 03-24-24 [facility]

## 2024-03-17 ENCOUNTER — Ambulatory Visit
Admission: RE | Admit: 2024-03-17 | Discharge: 2024-03-17 | Disposition: A | Discharge status: Home / Self Care | Source: Ambulatory Visit | Attending: Gastroenterology | Admitting: Gastroenterology

## 2024-03-17 DIAGNOSIS — R197 Diarrhea, unspecified: Secondary | ICD-10-CM | POA: Diagnosis present

## 2024-03-17 DIAGNOSIS — R110 Nausea: Secondary | ICD-10-CM | POA: Insufficient documentation

## 2024-03-17 DIAGNOSIS — Z8379 Family history of other diseases of the digestive system: Secondary | ICD-10-CM | POA: Diagnosis present

## 2024-03-17 DIAGNOSIS — R1084 Generalized abdominal pain: Secondary | ICD-10-CM | POA: Insufficient documentation

## 2024-03-17 DIAGNOSIS — R194 Change in bowel habit: Secondary | ICD-10-CM | POA: Diagnosis present

## 2024-03-17 DIAGNOSIS — R140 Abdominal distension (gaseous): Secondary | ICD-10-CM | POA: Diagnosis present

## 2024-03-17 DIAGNOSIS — R1010 Upper abdominal pain, unspecified: Secondary | ICD-10-CM | POA: Insufficient documentation

## 2024-03-17 MED ORDER — IOHEXOL 300 MG/ML  SOLN
100.0000 mL | Freq: Once | INTRAMUSCULAR | Status: AC | PRN
  Administered 2024-03-17: 100 mL via INTRAVENOUS

## 2024-03-21 ENCOUNTER — Emergency Department

## 2024-03-21 ENCOUNTER — Emergency Department: Admission: EM | Admit: 2024-03-21 | Discharge: 2024-03-21 | Disposition: A | Discharge status: Home / Self Care

## 2024-03-21 DIAGNOSIS — Z8673 Personal history of transient ischemic attack (TIA), and cerebral infarction without residual deficits: Secondary | ICD-10-CM | POA: Insufficient documentation

## 2024-03-21 DIAGNOSIS — R29818 Other symptoms and signs involving the nervous system: Secondary | ICD-10-CM | POA: Diagnosis not present

## 2024-03-21 DIAGNOSIS — I10 Essential (primary) hypertension: Secondary | ICD-10-CM | POA: Diagnosis not present

## 2024-03-21 DIAGNOSIS — G43109 Migraine with aura, not intractable, without status migrainosus: Secondary | ICD-10-CM

## 2024-03-21 DIAGNOSIS — R531 Weakness: Secondary | ICD-10-CM | POA: Diagnosis not present

## 2024-03-21 DIAGNOSIS — G43809 Other migraine, not intractable, without status migrainosus: Secondary | ICD-10-CM | POA: Insufficient documentation

## 2024-03-21 DIAGNOSIS — J45909 Unspecified asthma, uncomplicated: Secondary | ICD-10-CM | POA: Insufficient documentation

## 2024-03-21 DIAGNOSIS — R200 Anesthesia of skin: Secondary | ICD-10-CM | POA: Diagnosis present

## 2024-03-21 LAB — DIFFERENTIAL
Abs Immature Granulocytes: 0.01 K/uL (ref 0.00–0.07)
Basophils Absolute: 0 K/uL (ref 0.0–0.1)
Basophils Relative: 1 %
Eosinophils Absolute: 0.3 K/uL (ref 0.0–0.5)
Eosinophils Relative: 7 %
Immature Granulocytes: 0 %
Lymphocytes Relative: 39 %
Lymphs Abs: 1.9 K/uL (ref 0.7–4.0)
Monocytes Absolute: 0.3 K/uL (ref 0.1–1.0)
Monocytes Relative: 6 %
Neutro Abs: 2.2 K/uL (ref 1.7–7.7)
Neutrophils Relative %: 47 %

## 2024-03-21 LAB — COMPREHENSIVE METABOLIC PANEL WITH GFR
ALT: 28 U/L (ref 0–44)
AST: 26 U/L (ref 15–41)
Albumin: 3.5 g/dL (ref 3.5–5.0)
Alkaline Phosphatase: 86 U/L (ref 38–126)
Anion gap: 9 (ref 5–15)
BUN: 14 mg/dL (ref 6–20)
CO2: 26 mmol/L (ref 22–32)
Calcium: 8.7 mg/dL — ABNORMAL LOW (ref 8.9–10.3)
Chloride: 105 mmol/L (ref 98–111)
Creatinine, Ser: 0.67 mg/dL (ref 0.44–1.00)
GFR, Estimated: >60 mL/min (ref >60)
Glucose, Bld: 99 mg/dL (ref 70–99)
Potassium: 4 mmol/L (ref 3.5–5.1)
Sodium: 140 mmol/L (ref 135–145)
Total Bilirubin: 0.4 mg/dL (ref 0.0–1.2)
Total Protein: 7 g/dL (ref 6.5–8.1)

## 2024-03-21 LAB — CBC
HCT: 43.2 % (ref 36.0–46.0)
Hemoglobin: 13.8 g/dL (ref 12.0–15.0)
MCH: 27.7 pg (ref 26.0–34.0)
MCHC: 31.9 g/dL (ref 30.0–36.0)
MCV: 86.7 fL (ref 80.0–100.0)
Platelets: 200 K/uL (ref 150–400)
RBC: 4.98 MIL/uL (ref 3.87–5.11)
RDW: 13.2 % (ref 11.5–15.5)
WBC: 4.8 K/uL (ref 4.0–10.5)
nRBC: 0 % (ref 0.0–0.2)

## 2024-03-21 LAB — PROTIME-INR
INR: 1 (ref 0.8–1.2)
Prothrombin Time: 13.7 s (ref 11.4–15.2)

## 2024-03-21 LAB — APTT: aPTT: 28 s (ref 24–36)

## 2024-03-21 LAB — CBG MONITORING, ED: Glucose-Capillary: 129 mg/dL — ABNORMAL HIGH (ref 70–99)

## 2024-03-21 LAB — ETHANOL: Alcohol, Ethyl (B): <15 mg/dL (ref <15)

## 2024-03-21 MED ORDER — KETOROLAC TROMETHAMINE 15 MG/ML IJ SOLN
15.0000 mg | Freq: Once | INTRAMUSCULAR | Status: AC
  Administered 2024-03-21: 15 mg via INTRAVENOUS
  Filled 2024-03-21: qty 1

## 2024-03-21 MED ORDER — SODIUM CHLORIDE 0.9 % IV BOLUS
1000.0000 mL | Freq: Once | INTRAVENOUS | Status: AC
  Administered 2024-03-21: 1000 mL via INTRAVENOUS

## 2024-03-21 MED ORDER — SODIUM CHLORIDE 0.9% FLUSH: 3.0000 mL | Freq: Once | INTRAVENOUS | Status: DC

## 2024-03-21 MED ORDER — METOCLOPRAMIDE HCL 5 MG/ML IJ SOLN
10.0000 mg | Freq: Once | INTRAMUSCULAR | Status: AC
Start: 2024-03-21 — End: 2024-03-21
  Administered 2024-03-21: 10 mg via INTRAVENOUS
  Filled 2024-03-21: qty 2

## 2024-03-21 MED ORDER — DIPHENHYDRAMINE HCL 50 MG/ML IJ SOLN
25.0000 mg | Freq: Once | INTRAMUSCULAR | Status: AC
  Administered 2024-03-21: 25 mg via INTRAVENOUS
  Filled 2024-03-21: qty 1

## 2024-03-21 NOTE — Progress Notes (Signed)
   03/21/24 1300  Spiritual Encounters  Type of Visit Follow up  Care provided to: Family  Reason for visit Routine spiritual support  OnCall Visit Yes   Chaplain followed up to see if patient returned from CT.  Patient was sleeping and mother was at bedside.  Chaplain inquired on if results from CT were given and mother shared it was not a stroke.  Chaplain celebrated with patient's mother and let her know Chaplains were available if needed.  No other spiritual care needs at this time.   Rev. Rana M. Nicholaus, M.Div. Chaplain Resident  [facility]

## 2024-03-21 NOTE — ED Notes (Signed)
 Code stroke called to [facility]link  at  10:31am

## 2024-03-21 NOTE — Code Documentation (Signed)
 Stroke Response Nurse Documentation Code Documentation  Bridget Maldonado is a 43 y.o. female arriving to [facility] via [employer] on 03/21/2024 with past medical hx of migraines, obesity, HTN. On aspirin  81 mg daily. Code stroke was activated by ED.   Patient from home where she was LKW at 2200 before bed last night, 9/28 and now complaining of left sided weakness, numbness, and difficulty speaking. Patient reports she woke up feeling off this morning, but at 0900 had sudden onset left-sided weakness, numbness, and difficulty speaking. Brought to ED by mother.  Stroke team at the bedside on patient arrival. Patient cleared for CT by Dr. Clarine. Patient to CT with team. NIHSS 6, see documentation for details and code stroke times. Patient with disoriented, left arm weakness, left leg weakness, bilateral, left decreased sensation, Expressive aphasia , and dysarthria  on exam. The following imaging was completed:  CT Head and MRI. Patient is not a candidate for IV Thrombolytic due to outside window, per MD. Patient is not a candidate for IR due to exam not consistent with LVO, MRI brain negative for acute stroke, per MD.   Care Plan: cancel code stroke, per MD at 1149.   Process Delays Noted: none  Bedside handoff with ED RN Leontine ALONSO Burnard KANDICE Hershel  Stroke Response RN

## 2024-03-21 NOTE — ED Provider Notes (Signed)
 [facility] Provider Note    Event Date/Time   First MD Initiated Contact with Patient 03/21/24 1033     (approximate)   History   No chief complaint on file.  Woke up feeling badly today. Went to work and left side of face started to feel numb and left arm. Hx stoke in the past. Also history of complex migraines.  Sympotms started 0900.   HPI Bridget Maldonado is a 43 y.o. 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  Started having difficulty speaking and feels that her left arm is weak.  Not on blood thinners.  No preceding trauma. -Says she felt vaguely unwell this morning when she woke up but denied any focal deficits at that time. -POC glucose 129 in triage  Per chart review, patient did have an ED visit in June of this year for headache.  She consulted at that time, note the patient is known to have a history of functional migraines with symptoms.  Code stroke was activated at that time as well, CT head negative.  No indication for further workup per neurology, ultimately discharged home after treatment of migraine.  Had originally presented with slurred speech, left-sided weakness and decreased sensation.     Physical Exam   Triage Vital Signs: BP (!) 141/102   Pulse 77   Temp 98.7 F (37.1 C)   Resp 18   LMP 10/21/2021 (Approximate) Comment: Urine preg negatvie 03/04/22  SpO2 98%    Most recent vital signs: Vitals:   03/21/24 1400 03/21/24 1430  BP: (!) 140/97 (!) 141/102  Pulse: 73 77  Resp:  18  Temp:    SpO2: 98% 98%     General: Awake, no distress.  CV:  Good peripheral perfusion. RRR, RP 2+ Resp:  Normal effort. CTAB Abd:  No distention. Nontender to deep  palpation throughout Neuro:  Limited neuro exam en route to CT: +notable dysarthria, face symmetric, +difficulty lifting L arm, +L facial numbness    ED Results / Procedures / Treatments   Labs (all labs ordered are listed, but only abnormal results are displayed) Labs Reviewed  COMPREHENSIVE METABOLIC PANEL WITH GFR - Abnormal; Notable for the following components:      Result Value   Calcium  8.7 (*)    All other components within normal limits  CBG MONITORING, ED - Abnormal; Notable for the following components:   Glucose-Capillary 129 (*)    All other components within normal limits  PROTIME-INR  APTT  CBC  DIFFERENTIAL  ETHANOL  HCG, QUANTITATIVE, PREGNANCY     EKG  Ecg = sinus rhythm, rate 84, no gross ST elevation or depression, no significant repolarization normality, normal axis, normal intervals.  No evidence of ischemia nor arrhythmia on my interpretation.   RADIOLOGY Radiology interpreted myself and radiology report reviewed.  No acute pathology identified.    PROCEDURES:  Critical Care performed: Yes, see critical care procedure note(s)  .Critical Care  Performed by: Clarine Ozell LABOR, MD Authorized by: Clarine Ozell LABOR, MD   Critical care provider statement:    Critical care time (minutes):  30   Critical care time was exclusive of:  Separately billable procedures and treating other patients   Critical care was necessary to treat  or prevent imminent or life-threatening deterioration of the following conditions:  CNS failure or compromise   Critical care was time spent personally by me on the following activities:  Development of treatment plan with patient or surrogate, discussions with consultants, evaluation of patient's response to treatment, examination of patient, ordering and review of laboratory studies, ordering and review of radiographic studies, ordering and performing treatments and interventions, pulse oximetry, re-evaluation of patient's  condition and review of old charts   I assumed direction of critical care for this patient from another provider in my specialty: no      MEDICATIONS ORDERED IN ED: Medications  sodium chloride  flush (NS) 0.9 % injection 3 mL (3 mLs Intravenous Not Given 03/21/24 1230)  metoCLOPramide  (REGLAN ) injection 10 mg (10 mg Intravenous Given 03/21/24 1131)  diphenhydrAMINE  (BENADRYL ) injection 25 mg (25 mg Intravenous Given 03/21/24 1132)  sodium chloride  0.9 % bolus 1,000 mL (0 mLs Intravenous Stopped 03/21/24 1351)  ketorolac  (TORADOL ) 15 MG/ML injection 15 mg (15 mg Intravenous Given 03/21/24 1143)     IMPRESSION / MDM / ASSESSMENT AND PLAN / ED COURSE  I reviewed the triage vital signs and the nursing notes.                              DDX/MDM/AP: Differential diagnosis includes, but is not limited to, CVA, complex migraine, doubt underlying electrolyte abnormality as etiology of presentation.  Plan: - Code stroke - Emergent CT head and neurology consult -Labs - EKG - Cardiac monitor  Patient's presentation is most consistent with acute presentation with potential threat to life or bodily function.  The patient is on the cardiac monitor to evaluate for evidence of arrhythmia and/or significant heart rate changes.  ED course below.  Workup reassuring.  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  Patient did have full resolution of symptoms after migraine cocktail, stroke workup negative.  Discharged home with plan for outpatient neurology follow-up.  ED return precautions in place.  Patient and family agree with plan.  Clinical Course as of 03/21/24 1509  Mon Mar 21, 2024  1054 D/w on call neurologist also evaluated patient -Suspect nonorganic etiology of presentation though given history did want to escalate to MRI -Defer tPA or other medications  at this time  If MRI negative, recommends migraine cocktail and outpatient follow-up with neurology [MM]  1106 MRI reviewed by neurologist reassuring, formal radiology read pending  Recommends migraine cocktail and likely discharge home [MM]  1143 Patient reevaluated, neurologic exam remains unchanged  Reassured by unremarkable workup so far  Getting migraine cocktail [MM]  1144 MRI brain: IMPRESSION: 1. No acute intracranial abnormality. 2. Chronic subcortical white matter hyperintensities in the left parietal lobe and mild scattered white matter disease elsewhere, unchanged.   [MM]  1150 Cbc wnl [MM]  1235 Patient reevaluated, speech improved, appears to be improving appropriately  Will continue to monitor [MM]  1235 CMP reviewed, unremarkable [MM]  1502 Patient reevaluated, back to neurologic baseline, no further dysarthria, no further weakness or numbness.  Consistent with complex migraine with neurologic symptoms.  Will proceed with discharge home per neurology recommendation given the patient is now at baseline.  Plan to follow-up with her outpatient neurologist.  ED return precautions in place.  Patient agrees with plan. [MM]    Clinical Course User Index [MM] Clarine Ozell LABOR, MD  FINAL CLINICAL IMPRESSION(S) / ED DIAGNOSES   Final diagnoses:  Other migraine without status migrainosus, not intractable     Rx / DC Orders   ED Discharge Orders     None        Note:  This document was prepared using Dragon voice recognition software and may include unintentional dictation errors.   Clarine Ozell LABOR, MD 03/21/24 [phone]

## 2024-03-21 NOTE — ED Notes (Signed)
 Pt able to ambulate with steady gate at this time. Pt speaking clearly without difficulty. Pt reports feeling back to baseline. Pt given graham crackers and soda.

## 2024-03-21 NOTE — Code Documentation (Signed)
 CODE STROKE- PHARMACY COMMUNICATION   Time CODE STROKE called/page received: 1032  Time response to CODE STROKE was made (in person): 1037   Time Stroke Kit retrieved from Pyxis: not required  Name of Provider/Nurse contacted: Voncile, MD and Burnard, RN  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 Authorizing Provider  albuterol  (VENTOLIN  HFA) 108 (90 Base) MCG/ACT inhaler INHALE TWO INHALATIONS INTO THE LUNGS EVERY FOUR (FOUR) HOURS AS NEEDED 02/04/24   Jimmy Charlie FERNS, MD  ALPRAZolam  (XANAX ) 0.25 MG tablet Take 1 tablet (0.25 mg total) by mouth 3 (three) times daily as needed for anxiety. 03/14/24   Douglass Kenney NOVAK, FNP  aspirin  EC 81 MG tablet Take 81 mg by mouth daily. Swallow whole.    [provider]  atorvastatin  (LIPITOR) 20 MG tablet Take 1 tablet (20 mg total) by mouth daily. 09/14/23   Letvak, Richard I, MD  divalproex  (DEPAKOTE ) 500 MG DR tablet Take 1 tablet (500 mg total) by mouth 2 (two) times daily. 10/20/23   Jimmy Charlie FERNS, MD  lisinopril -hydrochlorothiazide  (ZESTORETIC ) 20-12.5 MG tablet Take 1 tablet by mouth daily. 12/30/22   Jimmy Charlie FERNS, MD  mometasone  (ELOCON ) 0.1 % ointment APPLY TWICE DAILY AS NEEDED FOR PSORIASIS 09/14/23   Jimmy Charlie FERNS, MD   nortriptyline (PAMELOR) 10 MG capsule Take 10 mg by mouth 2 (two) times daily.    [provider]  ondansetron  (ZOFRAN -ODT) 4 MG disintegrating tablet Take 1 tablet (4 mg total) by mouth every 8 (eight) hours as needed. 02/18/23   Dean Clarity, MD  prochlorperazine  (COMPAZINE ) 10 MG tablet Take 1 tablet (10 mg total) by mouth 2 (two) times daily as needed for nausea or vomiting. 07/10/23   Garrick Charleston, MD  traMADol  (ULTRAM ) 50 MG tablet Take 1 tablet (50 mg total) by mouth 2 (two) times daily as needed. 02/26/24   Webb, Padonda B, FNP    Adriana JONETTA Bolster ,PharmD Clinical Pharmacist  03/21/2024  10:35 AM

## 2024-03-21 NOTE — ED Triage Notes (Signed)
 Woke up feeling badly today. Went to work and left side of face started to feel numb and left arm. Hx stoke in the past. Also history of complex migraines.  Sympotms started 0900.

## 2024-03-21 NOTE — Consult Note (Addendum)
 NEUROLOGY CONSULT NOTE   Date of service: March 21, 2024 Patient Name: Bridget Maldonado MRN:  [id number] DOB:  1980-08-23 Chief Complaint: Left-sided weakness/code stroke Requesting Provider: Clarine Ozell LABOR, MD  History of Present Illness  Bridget Maldonado is a 43 y.o. female with hx of migraines, who has been evaluated in the past for strokelike symptoms with 1 MRI that had a punctate DWI restrictions in the left parietal lobe on an MRI on June 2024 along with chronic T2 hyperintensities concerning for prior stroke or other incidental findings, functional neurological symptoms noted in the past, anxiety, panic disorder, obesity, presented for evaluation of sudden onset of left-sided weakness. Reported last known well at 10 PM when she went to bed last night. Reports waking up feeling weird this morning but right around 9 AM noticed weakness and numbness on the left arm face and leg.  She also felt her speech was off.  Her speech has a stuttering as well as scanning pattern to it, which she reports being similar to when she has had her prior episodes. She is currently on Excedrin and Botox treatment for her migraines.   Review of system: Positive for headache.  Reportedly was hypotensive at home  LKW: 10 PM on 03/20/2024 Modified rankin score: 0-Completely asymptomatic and back to baseline post- stroke IV Thrombolysis: Outside the window EVT: Clinical exam not consistent with LVO, MRI brain negative for stroke  NIHSS components Score: Comment  1a Level of Conscious 0[x]  1[]  2[]  3[]      1b LOC Questions 0[]  1[x]  2[]       1c LOC Commands 0[x]  1[]  2[]       2 Best Gaze 0[x]  1[]  2[]       3 Visual 0[x]  1[]  2[]  3[]      4 Facial Palsy 0[x]  1[]  2[]  3[]      5a Motor Arm - left 0[]  1[x]  2[]  3[]  4[]  UN[]    5b Motor Arm - Right 0[x]  1[]  2[]  3[]  4[]  UN[]    6a Motor Leg - Left 0[]  1[x]  2[]  3[]  4[]  UN[]    6b Motor Leg - Right 0[x]  1[]  2[]  3[]  4[]  UN[]    7 Limb Ataxia 0[x]  1[]  2[]  UN[]      8  Sensory 0[]  1[x]  2[]  UN[]      9 Best Language 0[x]  1[]  2[]  3[]      10 Dysarthria 0[]  1[x]  2[]  UN[]      11 Extinct. 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   Increased risk of breast cancer 07/2017   IBIS=40%   Kidney infection    Pylonephritis when pregnant   LGSIL on Pap smear of cervix    Migraines    Obese    Psoriatic arthritis (HCC)    Secondary oligomenorrhea 07/16/2017    Past Surgical History:  Procedure Laterality Date   BREAST BIOPSY Left  8 years   CESAREAN SECTION     COLONOSCOPY     COLONOSCOPY W/ BIOPSIES     CYSTOSCOPY N/A 03/04/2022   Procedure: CYSTOSCOPY;  Surgeon: Izell Harari, MD;  Location: [facility] OR;  Service: Gynecology;  Laterality: N/A;   EAR TUBE REMOVAL     ENDOMETRIAL BIOPSY  12/23/2021   ESOPHAGOGASTRODUODENOSCOPY     LAPAROSCOPIC CHOLECYSTECTOMY  6/09   Dr. Kimble   LAPAROSCOPIC HYSTERECTOMY     TOTAL LAPAROSCOPIC HYSTERECTOMY WITH SALPINGECTOMY Bilateral 03/04/2022   Procedure: TOTAL LAPAROSCOPIC HYSTERECTOMY WITH SALPINGECTOMY;  Surgeon: Izell Harari, MD;  Location: [facility] OR;  Service: Gynecology;  Laterality: Bilateral;   TUBAL LIGATION  2017   TYMPANOSTOMY TUBE PLACEMENT      Family History: Family History  Problem Relation Age of Onset   Colon polyps Maternal Grandfather    Parkinson's disease Maternal Grandfather    Breast cancer Mother 56   Rheum arthritis Mother    Irritable bowel  syndrome Mother    Asthma Father    Anxiety disorder Father        Severe, possible bipolar   Irritable bowel syndrome Maternal Grandmother    Diabetes Other        mat. great uncles   Alzheimer's disease Other        Dad's side   Colon cancer Other        mat GGF    Social History  reports that she has never smoked. She has been exposed to tobacco smoke. She has never used smokeless tobacco. She reports that she does not drink alcohol and does not use drugs.  Allergies  Allergen Reactions   Sulfa  Antibiotics Nausea And Vomiting   Cipro [Ciprofloxacin Hcl] Nausea And Vomiting   Effexor [Venlafaxine Hcl] Other (See Comments)    Excessive sleeping, slept for 2 days    Medications   Current Facility-Administered Medications:    diphenhydrAMINE  (BENADRYL ) injection 25 mg, 25 mg, Intravenous, Once, Mian, Michael A, MD   ketorolac  (TORADOL ) 15 MG/ML injection 15 mg, 15 mg, Intravenous, Once, Mian, Michael A, MD   metoCLOPramide  (REGLAN ) injection 10 mg, 10 mg, Intravenous, Once, Clarine Ozell LABOR, MD   sodium chloride  0.9 % bolus 1,000 mL, 1,000 mL, Intravenous, Once, Clarine Ozell LABOR, MD   sodium chloride  flush (NS) 0.9 % injection 3 mL, 3 mL, Intravenous, Once, Clarine Ozell LABOR, MD  Current Outpatient Medications:    albuterol  (VENTOLIN  HFA) 108 (90 Base) MCG/ACT inhaler, INHALE TWO INHALATIONS INTO THE LUNGS EVERY FOUR (FOUR) HOURS AS NEEDED, Disp: 6.7 g, Rfl: 1   ALPRAZolam  (XANAX ) 0.25 MG tablet, Take 1 tablet (0.25 mg total) by mouth 3 (three) times daily as needed for anxiety., Disp: 60 tablet, Rfl: 0   aspirin  EC 81 MG tablet, Take 81 mg by mouth daily. Swallow whole., Disp: , Rfl:    atorvastatin  (LIPITOR) 20 MG tablet, Take 1 tablet (20 mg total) by mouth daily., Disp: 90 tablet, Rfl: 3   divalproex  (DEPAKOTE ) 500 MG DR tablet, Take 1 tablet (500 mg total) by mouth 2 (two) times daily., Disp: 60 tablet, Rfl: 3   lisinopril -hydrochlorothiazide  (ZESTORETIC ) 20-12.5 MG tablet,  Take 1 tablet by mouth daily., Disp: 90 tablet, Rfl: 3   mometasone  (ELOCON ) 0.1 % ointment, APPLY TWICE DAILY AS NEEDED FOR PSORIASIS, Disp: 45 g, Rfl: 1   nortriptyline (PAMELOR) 10 MG capsule, Take 10 mg by mouth 2 (two) times daily., Disp: , Rfl:    ondansetron  (ZOFRAN -ODT) 4 MG disintegrating tablet, Take  1 tablet (4 mg total) by mouth every 8 (eight) hours as needed., Disp: 20 tablet, Rfl: 0   prochlorperazine  (COMPAZINE ) 10 MG tablet, Take 1 tablet (10 mg total) by mouth 2 (two) times daily as needed for nausea or vomiting., Disp: 20 tablet, Rfl: 0   traMADol  (ULTRAM ) 50 MG tablet, Take 1 tablet (50 mg total) by mouth 2 (two) times daily as needed., Disp: 30 tablet, Rfl: 0  Vitals   Vitals:   April 01, 2024 1040  BP: (!) 145/107    There is no height or weight on file to calculate BMI.   Physical Exam   Close she is awake alert in no distress HEENT: Normocephalic atraumatic Lungs: Clear Cardiovascular: Regular rate rhythm Abdomen nondistended nontender Neurological exam She is awake alert oriented to self, the fact that she is in the hospital, her correct age but could not tell the month correctly. Naming, repetition and comprehension are intact although there is a lag in her answering questions.  Probably diminished attention concentration Her speech has a very stuttering and scanning-almost nonorganic appearing quality to it Cranial nerves II to XII: Pupils are equal round react light, she does not participate with the extraocular movements-does not follow the finger left or right but when asked to look to her left and look to her right she is able to do that without any restriction, heart feels are not restricted, face appears grossly symmetric, tongue and palate are midline. Motor examination reveals bobbing drift in the left upper and lower extremity.  No drift on the right Sensation exam reveals diminished sensation on the left hemibody with a sharp cut off in the midline along  with different sensation to tuning fork vibration on the right and left forehead. Coordination examination with no gross dysmetria  Labs/Imaging/Neurodiagnostic studies   CBC:  Recent Labs  Lab 04/01/24 1123  WBC 4.8  NEUTROABS 2.2  HGB 13.8  HCT 43.2  MCV 86.7  PLT 200   Basic Metabolic Panel:  Lab Results  Component Value Date   NA 140 03/11/2024   K 4.0 03/11/2024   CO2 30 03/11/2024   GLUCOSE 85 03/11/2024   BUN 12 03/11/2024   CREATININE 0.66 03/11/2024   CALCIUM  9.1 03/11/2024   GFRNONAA >60 11/24/2023   GFRAA >60 10/13/2019   Lipid Panel:  Lab Results  Component Value Date   LDLCALC 131 (H) 12/05/2022   HgbA1c:  Lab Results  Component Value Date   HGBA1C 4.9 12/05/2022   Urine Drug Screen:     Component Value Date/Time   LABOPIA POSITIVE (A) 02/24/2023 1009   LABOPIA NONE DETECTED 02/18/2023 0847   COCAINSCRNUR NONE DETECTED 02/24/2023 1009   LABBENZ POSITIVE (A) 02/24/2023 1009   LABBENZ POSITIVE (A) 02/18/2023 0847   AMPHETMU NONE DETECTED 02/24/2023 1009   AMPHETMU NONE DETECTED 02/18/2023 0847   THCU NONE DETECTED 02/24/2023 1009   THCU NONE DETECTED 02/18/2023 0847   LABBARB POSITIVE (A) 02/24/2023 1009   LABBARB NONE DETECTED 02/18/2023 0847    Alcohol Level     Component Value Date/Time   ETH <15 11/24/2023 1131   INR  Lab Results  Component Value Date   INR 1.0 07/10/2023   APTT  Lab Results  Component Value Date   APTT 29 07/10/2023   CT Head without contrast(Personally reviewed): No acute intracranial pathology.  Aspects 10.  MRI Brain(Personally reviewed): Negative for acute process.   ASSESSMENT   JAYNIE HITCH is a 43 y.o. female past history of  frequent strokelike symptom presentation, migraines, prior history of a MRI brain that was positive for a punctate left parietal stroke, who now presents for evaluation of sudden onset of left-sided numbness and weakness along with a headache with last known well last night.   Outside the window for IV thrombolysis.  Examination not consistent with LVO.  Certainly nonorganic findings on exam prompted me to get a stat MRI since she had 1 prior MRI with a punctate area of restricted diffusion in the left parietal lobe--I do not want to miss any acute stroke. Her MRI brain was completed stat and reviewed-no evidence of acute infarct. 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   RECOMMENDATIONS  I would recommend migraine cocktail at this time Blood pressure goal-normotension. Follow-up with outpatient neurology Plan discussed with Dr. Ozell Klein, EDP ______________________________________________________________________    Signed, Eligio Lav, MD [facility]

## 2024-03-21 NOTE — Discharge Instructions (Signed)
 Your evaluation in the emergency department was overall reassuring.  You were evaluated by a neurologist, and we suspect you had a complex migraine.  Fortunately your symptoms resolved with treatment.  Please follow-up with your neurologist and primary care provider for reevaluation, and return to the emergency department with any new or worsening symptoms.

## 2024-03-21 NOTE — Progress Notes (Addendum)
   03/21/24 1030  Spiritual Encounters  Type of Visit Initial  Care provided to: Family  Reason for visit Code  OnCall Visit Yes   Chaplain visited with patient's mother who was in the room while patient was in the CT.  Chaplain offered a compassionate presence and assessed if there were any needs.  Patient's mother said she was fine and mentioned her sister is a Clinical biochemist.  Chaplain let the patient's mother know Chaplains are available if needed and she will follow-up.    Rev. Rana M. Nicholaus, M.Div. Chaplain Resident [facility]

## 2024-03-24 ENCOUNTER — Ambulatory Visit

## 2024-03-24 VITALS — BP 124/80 | HR 68 | Temp 98.7°F | Ht 63.5 in | Wt 234.0 lb

## 2024-03-24 DIAGNOSIS — E785 Hyperlipidemia, unspecified: Secondary | ICD-10-CM

## 2024-03-24 DIAGNOSIS — Z23 Encounter for immunization: Secondary | ICD-10-CM | POA: Diagnosis not present

## 2024-03-24 DIAGNOSIS — Z8673 Personal history of transient ischemic attack (TIA), and cerebral infarction without residual deficits: Secondary | ICD-10-CM | POA: Diagnosis not present

## 2024-03-24 DIAGNOSIS — Z131 Encounter for screening for diabetes mellitus: Secondary | ICD-10-CM

## 2024-03-24 DIAGNOSIS — E669 Obesity, unspecified: Secondary | ICD-10-CM

## 2024-03-24 DIAGNOSIS — Z9189 Other specified personal risk factors, not elsewhere classified: Secondary | ICD-10-CM

## 2024-03-24 LAB — HEMOGLOBIN A1C: Hgb A1c MFr Bld: 5.3 % (ref 4.6–6.5)

## 2024-03-24 MED ORDER — ATORVASTATIN CALCIUM 20 MG PO TABS: 40.0000 mg | ORAL_TABLET | Freq: Every day | ORAL | Status: AC

## 2024-03-24 MED ORDER — WEGOVY 0.25 MG/0.5ML ~~LOC~~ SOAJ: 0.2500 mg | SUBCUTANEOUS | 0 refills | Status: AC

## 2024-03-24 NOTE — Patient Instructions (Addendum)
 Thank you for visiting [facility] today! Here's what we talked about: - Restart Lipitor 2 tablets every night - Read about Buspar and Atarax

## 2024-03-24 NOTE — Addendum Note (Signed)
 Addended by: KALLIE CLOTILDA SQUIBB on: 03/24/2024 02:17 PM   Modules accepted: Orders

## 2024-03-24 NOTE — Progress Notes (Addendum)
 Subjective:    Patient ID: Bridget Maldonado, female    DOB: May 05, 1981, 43 y.o.   MRN: [id number]   Bridget Maldonado is a very pleasant 43 y.o. female who presents today for weight loss. Albuterol  if weather changes, has only needed it once so far.  Taking xanax , she is a single parent with child who has autism and ADHD. Needed it about 4 times this week. Taking tramadol  as needed for joint pains from psoriatic arthritis, says the Tremfya has helped with the psoriasis, but not the joint pain.   Review of Systems  All other systems reviewed and are negative.       Allergies  Allergen Reactions   Sulfa  Antibiotics Nausea And Vomiting   Cipro [Ciprofloxacin Hcl] Nausea And Vomiting   Effexor [Venlafaxine Hcl] Other (See Comments)    Excessive sleeping, slept for 2 days    Current Outpatient Medications on File Prior to Visit  Medication Sig Dispense Refill   divalproex  (DEPAKOTE ) 500 MG DR tablet Take 1 tablet (500 mg total) by mouth 2 (two) times daily. 60 tablet 3   lisinopril -hydrochlorothiazide  (ZESTORETIC ) 20-12.5 MG tablet Take 1 tablet by mouth daily. 90 tablet 3   mometasone  (ELOCON ) 0.1 % ointment APPLY TWICE DAILY AS NEEDED FOR PSORIASIS 45 g 1   ondansetron  (ZOFRAN -ODT) 4 MG disintegrating tablet Take 1 tablet (4 mg total) by mouth every 8 (eight) hours as needed. 20 tablet 0   prochlorperazine  (COMPAZINE ) 10 MG tablet Take 1 tablet (10 mg total) by mouth 2 (two) times daily as needed for nausea or vomiting. 20 tablet 0   traMADol  (ULTRAM ) 50 MG tablet Take 1 tablet (50 mg total) by mouth 2 (two) times daily as needed. 30 tablet 0   TREMFYA PEN 100 MG/ML pen Inject 100 mg into the skin once a week.     albuterol  (VENTOLIN  HFA) 108 (90 Base) MCG/ACT inhaler INHALE TWO INHALATIONS INTO THE LUNGS EVERY FOUR (FOUR) HOURS AS NEEDED 6.7 g 1   ALPRAZolam  (XANAX ) 0.25 MG tablet Take 1 tablet (0.25 mg total) by mouth 3 (three) times daily as needed for anxiety. 60 tablet 0    aspirin  EC 81 MG tablet Take 81 mg by mouth daily. Swallow whole.     No current facility-administered medications on file prior to visit.    BP 124/80 (BP Location: Left Arm, Patient Position: Sitting, Cuff Size: Large)   Pulse 68   Temp 98.7 F (37.1 C) (Oral)   Ht 5' 3.5 (1.613 m)   Wt 234 lb (106.1 kg)   LMP 10/21/2021 (Approximate) Comment: Urine preg negatvie 03/04/22  SpO2 98%   BMI 40.80 kg/m   Objective:    Physical Exam Vitals and nursing note reviewed.  Constitutional:      Appearance: She is obese.  HENT:     Head: Normocephalic and atraumatic.  Eyes:     Extraocular Movements: Extraocular movements intact.     Conjunctiva/sclera: Conjunctivae normal.  Skin:    General: Skin is warm.  Neurological:     Mental Status: She is alert.  Psychiatric:        Mood and Affect: Mood normal.        Behavior: Behavior normal.          Assessment & Plan:   1. Obesity (BMI 30-39.9) (Primary) BMI is 40, patient reportedly spoke to insurance who confirmed that [facility] will be covered.extensively counseled patient on the risks of GLP-1's and associated side effects, she has  no contraindications, verbalized understanding and opts to proceed, sent starting dose as below with plans to uptitrate in 4 weeks. Extensively discussed about the importance of diet/lifestyle changes in promoting and maintaining weight loss, she is agreeable to weight management referral.  - semaglutide-weight management (WEGOVY) 0.25 MG/0.5ML SOAJ SQ injection; Inject 0.25 mg into the skin once a week for 4 doses.  Dispense: 2 mL; Refill: 0 - Amb Ref to Medical Weight Management  2. History of CVA (cerebrovascular accident) 3. Diabetes mellitus screening 4. Hyperlipidemia, unspecified hyperlipidemia type Per chart review, MRI from June 2024 showed findings in the left parietal lobe consistent with acute/subacute infarct, also showed multiple small T2 hyperintense foci in the left parietal lobe,  suggestive of prior small infarcts.  Patient was hospitalized at that time, treated with TNK, started on aspirin , statin and continues to follow with neurology.  She self discontinued Lipitor, extensively counseled about controlling CVD risk factors, she is agreeable to restart, counseled patient she should be on high intensity at 40 mg as below. Per chart review, A1c around the time of stroke was found to be normal at 4.9, will repeat for monitoring of this associated risk factor.  - Hemoglobin A1c - atorvastatin  (LIPITOR) 20 MG tablet; Take 2 tablets (40 mg total) by mouth daily. - Continue aspirin  81 mg daily  5. At risk for side effect of medication Patient is on numerous medications, at significant risk for polypharmacy.  Extensively discussed risks associated with tramadol  and Xanax  use.  Patient verbalized understanding and opts to continue tramadol  since it gives significant relief from her arthritis.  Discussed safer alternatives for anxiety since Xanax  use is sporadic and can be discontinued without withdrawal symptoms, she would like to read about BuSpar and Atarax, patient information given.  Will rediscuss this at a later time.  Return in about 4 weeks (around 04/21/2024) for Wegovy then 08/24/2024 for CPE.   Kamya Watling K Annajulia Lewing, MD  03/24/24

## 2024-03-25 ENCOUNTER — Telehealth: Payer: Self-pay

## 2024-03-25 NOTE — Telephone Encounter (Signed)
 Copied from CRM [id number]. Topic: Clinical - Prescription Issue >> Mar 25, 2024 10:25 AM Bridget Maldonado wrote: Reason for CRM: Patient called in requesting a stat prior authorization for the [facility],. Patients pharmacy sent fax of it over. Patient is requesting a call back at [phone].

## 2024-03-28 ENCOUNTER — Other Ambulatory Visit (HOSPITAL_COMMUNITY): Payer: Self-pay

## 2024-03-28 ENCOUNTER — Ambulatory Visit: Payer: Self-pay

## 2024-03-28 ENCOUNTER — Telehealth: Payer: Self-pay

## 2024-03-28 NOTE — Telephone Encounter (Signed)
 Pharmacy Patient Advocate Encounter  Received notification from [facility] that Prior Authorization for Wegovy 0.25 has been DENIED.  Full denial letter will be uploaded to the media tab. See denial reason below.    PA #/Case ID/Reference #: [id number]

## 2024-03-28 NOTE — Telephone Encounter (Signed)
 Please call patient and advise about denial.  Let her know we can retry for [facility] after she has been seen by weight management for 6 months.

## 2024-03-28 NOTE — Telephone Encounter (Signed)
 Pharmacy Patient Advocate Encounter   Received notification from Pt Calls Messages that prior authorization for Wegovy 0.25 is required/requested.   Insurance verification completed.   The patient is insured through [facility].   Per test claim: PA required; PA submitted to above mentioned insurance via Latent Key/confirmation #/EOC [id number] Status is pending

## 2024-03-29 ENCOUNTER — Other Ambulatory Visit (HOSPITAL_COMMUNITY): Payer: Self-pay

## 2024-03-30 MED ORDER — TRAMADOL HCL 50 MG PO TABS: 50.0000 mg | ORAL_TABLET | Freq: Two times a day (BID) | ORAL | 0 refills | Status: DC | PRN

## 2024-04-20 ENCOUNTER — Other Ambulatory Visit: Payer: Self-pay

## 2024-04-20 ENCOUNTER — Other Ambulatory Visit: Payer: Self-pay | Admitting: Family

## 2024-04-21 ENCOUNTER — Ambulatory Visit: Admitting: Gastroenterology

## 2024-04-25 ENCOUNTER — Ambulatory Visit

## 2024-04-25 NOTE — Telephone Encounter (Signed)
 Last written 03-14-24 #60 Last OV 03-24-24 Next OV 09-14-23 [facility]

## 2024-04-26 ENCOUNTER — Telehealth: Payer: Self-pay

## 2024-04-26 NOTE — Telephone Encounter (Signed)
 Called patient and extensively discussed the risks of use of both tramadol  and Xanax , especially Xanax  being at 0.25 mg 3 times daily as needed.  Counseled about risk of sedation, drowsiness, cardiorespiratory suppression, as well as dependence and withdrawal syndromes. Patient agreeable to de-escalate dose to 0.25 mg tablet daily as needed, with plans to discuss alternatives for mood treatment at her next visit. PDMP reviewed and Xanax  refilled at reduced dose.

## 2024-05-02 NOTE — Progress Notes (Deleted)
 Medical Nutrition Therapy   Appointment Start time:  ***  Appointment End time:  ***  Primary concerns today: ***  Referral diagnosis: Obesity (BMI 30-39.9)  Preferred learning style: *** (auditory, visual, hands on, no preference indicated) Learning readiness: *** (not ready, contemplating, ready, change in progress)   NUTRITION ASSESSMENT    Clinical Medical Hx: *** Medications: *** Labs: *** Notable Signs/Symptoms: ***  Lifestyle & Dietary Hx ***  Estimated daily fluid intake: *** oz Supplements: *** Sleep: *** Stress / self-care: *** Current average weekly physical activity: ***  24-Hr Dietary Recall First Meal: *** Snack: *** Second Meal: *** Snack: *** Third Meal: *** Snack: *** Beverages: ***  Estimated Energy Needs Calories: *** Carbohydrate: ***g Protein: ***g Fat: ***g   NUTRITION DIAGNOSIS  {CHL AMB NUTRITIONAL DIAGNOSIS:[id number]}   NUTRITION INTERVENTION  Nutrition education (E-1) on the following topics:  ***  Handouts Provided Include  ***  Learning Style & Readiness for Change Teaching method utilized: Visual & Auditory  Demonstrated degree of understanding via: Teach Back  Barriers to learning/adherence to lifestyle change: ***  Goals Established by Pt ***   MONITORING & EVALUATION Dietary intake, weekly physical activity, and *** in ***.  Next Steps  Patient is to ***.

## 2024-05-09 ENCOUNTER — Ambulatory Visit: Admitting: Dietician

## 2024-05-09 DIAGNOSIS — E669 Obesity, unspecified: Secondary | ICD-10-CM

## 2024-06-06 ENCOUNTER — Ambulatory Visit: Admitting: Gastroenterology

## 2024-06-28 ENCOUNTER — Emergency Department

## 2024-06-28 ENCOUNTER — Emergency Department
Admission: EM | Admit: 2024-06-28 | Discharge: 2024-06-28 | Disposition: A | Discharge status: Home / Self Care | Attending: Emergency Medicine | Admitting: Emergency Medicine

## 2024-06-28 DIAGNOSIS — R531 Weakness: Secondary | ICD-10-CM | POA: Diagnosis not present

## 2024-06-28 DIAGNOSIS — J45909 Unspecified asthma, uncomplicated: Secondary | ICD-10-CM | POA: Diagnosis not present

## 2024-06-28 DIAGNOSIS — R200 Anesthesia of skin: Secondary | ICD-10-CM | POA: Diagnosis present

## 2024-06-28 DIAGNOSIS — I10 Essential (primary) hypertension: Secondary | ICD-10-CM | POA: Insufficient documentation

## 2024-06-28 DIAGNOSIS — Z7982 Long term (current) use of aspirin: Secondary | ICD-10-CM

## 2024-06-28 LAB — CBC WITH DIFFERENTIAL/PLATELET
Abs Immature Granulocytes: 0.01 K/uL (ref 0.00–0.07)
Basophils Absolute: 0 K/uL (ref 0.0–0.1)
Basophils Relative: 1 %
Eosinophils Absolute: 0.1 K/uL (ref 0.0–0.5)
Eosinophils Relative: 1 %
HCT: 47.8 % — ABNORMAL HIGH (ref 36.0–46.0)
Hemoglobin: 15.7 g/dL — ABNORMAL HIGH (ref 12.0–15.0)
Immature Granulocytes: 0 %
Lymphocytes Relative: 27 %
Lymphs Abs: 2.4 K/uL (ref 0.7–4.0)
MCH: 28.3 pg (ref 26.0–34.0)
MCHC: 32.8 g/dL (ref 30.0–36.0)
MCV: 86.3 fL (ref 80.0–100.0)
Monocytes Absolute: 0.4 K/uL (ref 0.1–1.0)
Monocytes Relative: 4 %
Neutro Abs: 5.8 K/uL (ref 1.7–7.7)
Neutrophils Relative %: 67 %
Platelets: 265 K/uL (ref 150–400)
RBC: 5.54 MIL/uL — ABNORMAL HIGH (ref 3.87–5.11)
RDW: 12.9 % (ref 11.5–15.5)
WBC: 8.8 K/uL (ref 4.0–10.5)
nRBC: 0 % (ref 0.0–0.2)

## 2024-06-28 LAB — COMPREHENSIVE METABOLIC PANEL WITH GFR
ALT: 20 U/L (ref 0–44)
AST: 24 U/L (ref 15–41)
Albumin: 4.5 g/dL (ref 3.5–5.0)
Alkaline Phosphatase: 157 U/L — ABNORMAL HIGH (ref 38–126)
Anion gap: 10 (ref 5–15)
BUN: 13 mg/dL (ref 6–20)
CO2: 26 mmol/L (ref 22–32)
Calcium: 9.3 mg/dL (ref 8.9–10.3)
Chloride: 101 mmol/L (ref 98–111)
Creatinine, Ser: 0.73 mg/dL (ref 0.44–1.00)
GFR, Estimated: >60 mL/min
Glucose, Bld: 87 mg/dL (ref 70–99)
Potassium: 4.3 mmol/L (ref 3.5–5.1)
Sodium: 137 mmol/L (ref 135–145)
Total Bilirubin: 0.5 mg/dL (ref 0.0–1.2)
Total Protein: 7.9 g/dL (ref 6.5–8.1)

## 2024-06-28 LAB — PROTIME-INR
INR: 1 (ref 0.8–1.2)
Prothrombin Time: 13.3 s (ref 11.4–15.2)

## 2024-06-28 LAB — APTT: aPTT: 29 s (ref 24–36)

## 2024-06-28 LAB — ETHANOL: Alcohol, Ethyl (B): <15 mg/dL

## 2024-06-28 LAB — CBG MONITORING, ED: Glucose-Capillary: 92 mg/dL (ref 70–99)

## 2024-06-28 MED ORDER — IOHEXOL 350 MG/ML SOLN
100.0000 mL | Freq: Once | INTRAVENOUS | Status: AC | PRN
  Administered 2024-06-28: 100 mL via INTRAVENOUS

## 2024-06-28 MED ORDER — LABETALOL HCL 5 MG/ML IV SOLN
INTRAVENOUS | Status: AC
  Filled 2024-06-28: qty 4

## 2024-06-28 MED ORDER — SODIUM CHLORIDE 0.9% FLUSH: 3.0000 mL | Freq: Once | INTRAVENOUS | Status: DC

## 2024-06-28 NOTE — ED Notes (Signed)
 Outside of window for TNK. Remaining in CT for CTA and perfusion, no changes. Denies HA. Permissive HTN allowed. Will monitor.

## 2024-06-28 NOTE — ED Notes (Addendum)
 Blood sent. Neuro exam in progress in CT. Dry CT complete. Neuro MD and RN present. Pending decision on further imaging. Pt alert, NAD, calm, interactive, particiaptory, following commands and answering questions. Choppy word statements.

## 2024-06-28 NOTE — ED Triage Notes (Addendum)
 Pt to ED via POV from home. Pt went to bed at 8:45pm on 1/5. Pt reports woke up 0715 with speech changes. Pt reports was at work around 0900am and progressively got worse and reports intermittent blurry vision to left ey. Pt reports weakness to left arm and leg. Pt reports hx of CVA and complex migraines. Pt reports pressure behind left eye. Drift to left arm with difficulty raising it. LVO +. CODE STROKE called in triage.

## 2024-06-28 NOTE — Progress Notes (Signed)
 CODE STROKE- PHARMACY COMMUNICATION   Time CODE STROKE called/page received:1030  Time response to CODE STROKE was made (in person or via phone): immediately  Time Stroke Kit retrieved from Pyxis (only if needed):n/a  Name of Provider/Nurse contacted:Dr. 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 Authorizing Provider  albuterol  (VENTOLIN  HFA) 108 (90 Base) MCG/ACT inhaler INHALE TWO INHALATIONS INTO THE LUNGS EVERY FOUR (FOUR) HOURS AS NEEDED 02/04/24   Jimmy Charlie FERNS, MD  aspirin  EC 81 MG tablet Take 81 mg by mouth daily. Swallow whole.    [provider]  atorvastatin  (LIPITOR) 20 MG tablet Take 2 tablets (40 mg total) by mouth daily. 03/24/24   Bowa, Nahosi K, MD  divalproex  (DEPAKOTE ) 500 MG DR tablet Take 1 tablet (500 mg total) by mouth 2 (two) times daily. 10/20/23   Jimmy Charlie FERNS, MD  lisinopril -hydrochlorothiazide  (ZESTORETIC ) 20-12.5 MG tablet Take 1 tablet by mouth daily. 12/30/22   Jimmy Charlie FERNS, MD  mometasone  (ELOCON ) 0.1 % ointment APPLY TWICE DAILY AS NEEDED FOR PSORIASIS 09/14/23   Jimmy Charlie FERNS, MD  ondansetron  (ZOFRAN -ODT) 4 MG disintegrating tablet Take 1 tablet (4 mg total) by mouth every 8 (eight) hours as needed. 02/18/23   Dean Clarity,  MD  prochlorperazine  (COMPAZINE ) 10 MG tablet Take 1 tablet (10 mg total) by mouth 2 (two) times daily as needed for nausea or vomiting. 07/10/23   Garrick Charleston, MD  traMADol  (ULTRAM ) 50 MG tablet Take 1 tablet (50 mg total) by mouth every 12 (twelve) hours as needed. 04/21/24   Bowa, Nahosi K, MD  TREMFYA PEN 100 MG/ML pen Inject 100 mg into the skin once a week. 12/16/23   [provider]    Leonor JAYSON Argyle ,PharmD 06/28/2024  10:54 AM

## 2024-06-28 NOTE — Discharge Instructions (Signed)
 As we discussed please take your typical migraine medications.  If your symptoms fail to resolve or worsen at any point please return immediately to the emergency department for further evaluation.

## 2024-06-28 NOTE — ED Notes (Signed)
 From CT to exam room 18, no changes.

## 2024-06-28 NOTE — ED Notes (Signed)
 EDP at [facility]

## 2024-06-28 NOTE — Consult Note (Signed)
 NEUROLOGY CONSULT NOTE   Date of service: June 28, 2024 Patient Name: Bridget Maldonado MRN:  [id number] DOB:  [birth year] Chief Complaint: Acute onset of left sided weakness and numbness Requesting Provider: Dorothyann Drivers, MD  History of Present Illness  Bridget Maldonado is a 44 y.o. female who is very well known to our service with multiple prior presentations for acute onset of left sided numbness and weakness, some with stuttering speech as well as headache. She re-presents today with similar symptoms. She has a PMHx also including punctate ischemic infarct in June 2024, evidence on imaging for a prior small cluster of cortically based left parietal lobe ischemic infarctions, endometriosis, abnormal uterine bleeding, anxiety, bipolar disorder, migraine headache, HTN, obesity and psoriatic arthritis. She presents to the [name] ED today after experiencing acute onset of left sided numbness with weakness affecting her leg, arm and face, in conjunction with slurred/stuttering speech. Home medications include ASA and atorvastatin .   LKW: 8:45 PM Monday Modified rankin score: 0 IV Thrombolysis:  No: Outside of the time window EVT: No: CTA negative for LVO   NIHSS components Score: Comment  1a Level of Conscious 0[x]  1[]  2[]  3[]      1b LOC Questions 0[]  1[x]  2[]      Gives the incorrect month  1c LOC Commands 0[x]  1[]  2[]       2 Best Gaze 0[]  1[x]  2[]      Hesitancy with both left and rightward gaze, not able to move > 3 mm to left or right from the midline. Can be overcome by oculocephalic maneuver  3 Visual 0[x]  1[]  2[]  3[]      4 Facial Palsy 0[x]  1[]  2[]  3[]      5a Motor Arm - left 0[]  1[]  2[]  3[x]  4[]  UN[]    Drops immediately to bed, moves left hand slightly. Variable slight resistance against examiner, with giveway.   5b Motor Arm - Right 0[]  1[x]  2[]  3[]  4[]  UN[]   Slow drift of about 5 inches.   6a Motor Leg - Left 0[]  1[]  2[]  3[x]  4[]  UN[]   Drops immediately to bed, moves left toe  slightly. When testing against resistance while asking patient to keep leg elevated at hip antigravity, she flexes at the hip, moving leg down towards CT table against examiner's resistance. Extends at knee against resistance briefly with 4-/5 strength, then gives way  6b Motor Leg - Right 0[x]  1[]  2[]  3[]  4[]  UN[]    7 Limb Ataxia 0[x]  1[]  2[]  UN[]      8 Sensory 0[]  1[x]  2[]  UN[]     Left face, arm and leg with subjectively decreased temp and FT sensation  9 Best Language 0[]  1[x]  2[]  3[]     Cannot name any items on naming card, which is incongruent with the remainder of her speech and her performance on the cookie jar scene with intact naming and fluency, but stuttering speech output with a baby-talk quality.   10 Dysarthria 0[x]  1[]  2[]  UN[]      11 Extinct. 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   Increased risk of breast cancer 07/2017   IBIS=40%   Kidney infection    Pylonephritis when pregnant   LGSIL on Pap smear of cervix    Migraines    Obese    Psoriatic arthritis (HCC)    Secondary oligomenorrhea 07/16/2017    Past Surgical History:  Procedure Laterality Date   BREAST BIOPSY Left 8 years   CESAREAN SECTION     COLONOSCOPY     COLONOSCOPY W/ BIOPSIES     CYSTOSCOPY N/A 03/04/2022   Procedure: CYSTOSCOPY;  Surgeon: Izell Harari, MD;  Location: [facility] OR;  Service: Gynecology;  Laterality: N/A;   EAR TUBE REMOVAL      ENDOMETRIAL BIOPSY  12/23/2021   ESOPHAGOGASTRODUODENOSCOPY     LAPAROSCOPIC CHOLECYSTECTOMY  6/09   Dr. Kimble   LAPAROSCOPIC HYSTERECTOMY     TOTAL LAPAROSCOPIC HYSTERECTOMY WITH SALPINGECTOMY Bilateral 03/04/2022   Procedure: TOTAL LAPAROSCOPIC HYSTERECTOMY WITH SALPINGECTOMY;  Surgeon: Izell Harari, MD;  [facility] OR;  Service: Gynecology;  Laterality: Bilateral;   TUBAL LIGATION  2017   TYMPANOSTOMY TUBE PLACEMENT      Family History: Family History  Problem Relation Age of Onset   Colon polyps Maternal Grandfather    Parkinson's disease Maternal Grandfather    Breast cancer Mother 94   Rheum arthritis Mother    Irritable bowel syndrome Mother    Asthma Father    Anxiety disorder Father        Severe, possible bipolar   Irritable bowel syndrome Maternal Grandmother    Diabetes Other        mat. great uncles   Alzheimer's disease Other        Dad's side   Colon cancer Other        mat GGF    Social History  reports that she has never smoked. She has been exposed to tobacco smoke. She has never used smokeless tobacco. She reports that she does not drink alcohol and does not use drugs.  Allergies[1]  Medications  Current Medications[2]  Vitals   Vitals:   06/28/24 1027  BP: (!) 160/118  Pulse: 88  Resp: 20  Temp: 98 F (36.7 C)  TempSrc: Oral  SpO2: 98%    There is no height or weight on file to calculate BMI.   Physical Exam   Constitutional: Appears well-developed and well-nourished.  Psych: Anxious affect  Eyes: No scleral injection.  HENT: No OP obstruction.  Head: Normocephalic.  Respiratory: Effort normal, non-labored breathing.  Skin: WDI.   Neurologic Examination   See NIHSS  Labs/Imaging/Neurodiagnostic studies   CBC: No results for input(s): WBC, NEUTROABS, HGB, HCT, MCV, PLT in the last 168 hours. Basic Metabolic Panel:  Lab Results  Component Value Date   NA 140 03/21/2024   K 4.0 03/21/2024   CO2 26  03/21/2024   GLUCOSE 99 03/21/2024   BUN 14 03/21/2024   CREATININE 0.67 03/21/2024   CALCIUM  8.7 (L) 03/21/2024   GFRNONAA >60 03/21/2024   GFRAA >60 10/13/2019   Lipid Panel:  Lab Results  Component Value Date   LDLCALC 131 (H) 12/05/2022   HgbA1c:  Lab Results  Component Value Date   HGBA1C 5.3 03/24/2024     Alcohol Level     Component Value Date/Time  ETH <15 03/21/2024 1123   INR  Lab Results  Component Value Date   INR 1.0 03/21/2024   APTT  Lab Results  Component Value Date   APTT 28 03/21/2024     ASSESSMENT  Bridget Maldonado is a 44 y.o. female who is very well known to our service with multiple prior presentations for acute onset of left sided numbness and weakness, some with stuttering speech as well as headache. She re-presents today with similar symptoms. She has a PMHx also including punctate ischemic infarct in June 2024, evidence on imaging for a prior small cluster of cortically based left parietal lobe ischemic infarctions, endometriosis, abnormal uterine bleeding, anxiety, bipolar disorder, migraine headache, HTN, obesity and psoriatic arthritis. She presents to the [facility] ED today after experiencing acute onset of left sided numbness with weakness affecting her leg, arm and face, in conjunction with slurred/stuttering speech. Home medications include ASA and atorvastatin .  - Exam reveals an NIHSS of 11 with left sided findings that are most consistent with functional deficits.  - CT head: Normal non contrast CT appearance of the brain. ASPECTS 10.  - CTA of head and neck with CTP: Negative for large vessel occlusion.  Negative CT brain perfusion. No significant atherosclerosis or stenosis in the head or neck vessels. - Impression:  - Functional left sided weakness  - The patient has been reassured regarding her imaging studies and states that she is rapidly improving. She expresses a desire to be discharged home at this time  RECOMMENDATIONS  - Assess  ambulation. If her symptoms are resolved, can discharge home. - Continue home ASA and atorvastatin  - Outpatient Neurology follow up ______________________________________________________________________    Bonney SHARK, Rana Adorno, MD [facility]     [1]  Allergies Allergen Reactions   Sulfa  Antibiotics Nausea And Vomiting   Cipro [Ciprofloxacin Hcl] Nausea And Vomiting   Effexor [Venlafaxine Hcl] Other (See Comments)    Excessive sleeping, slept for 2 days  [2]  Current Facility-Administered Medications:    sodium chloride  flush (NS) 0.9 % injection 3 mL, 3 mL, Intravenous, Once, Dorothyann Drivers, MD  Current Outpatient Medications:    albuterol  (VENTOLIN  HFA) 108 (90 Base) MCG/ACT inhaler, INHALE TWO INHALATIONS INTO THE LUNGS EVERY FOUR (FOUR) HOURS AS NEEDED, Disp: 6.7 g, Rfl: 1   aspirin  EC 81 MG tablet, Take 81 mg by mouth daily. Swallow whole., Disp: , Rfl:    atorvastatin  (LIPITOR) 20 MG tablet, Take 2 tablets (40 mg total) by mouth daily., Disp: , Rfl:    divalproex  (DEPAKOTE ) 500 MG DR tablet, Take 1 tablet (500 mg total) by mouth 2 (two) times daily., Disp: 60 tablet, Rfl: 3   lisinopril -hydrochlorothiazide  (ZESTORETIC ) 20-12.5 MG tablet, Take 1 tablet by mouth daily., Disp: 90 tablet, Rfl: 3   mometasone  (ELOCON ) 0.1 % ointment, APPLY TWICE DAILY AS NEEDED FOR PSORIASIS, Disp: 45 g, Rfl: 1   ondansetron  (ZOFRAN -ODT) 4 MG disintegrating tablet, Take 1 tablet (4 mg total) by mouth every 8 (eight) hours as needed., Disp: 20 tablet, Rfl: 0   prochlorperazine  (COMPAZINE ) 10 MG tablet, Take 1 tablet (10 mg total) by mouth 2 (two) times daily as needed for nausea or vomiting., Disp: 20 tablet, Rfl: 0   traMADol  (ULTRAM ) 50 MG tablet, Take 1 tablet (50 mg total) by mouth every 12 (twelve) hours as needed., Disp: 30 tablet, Rfl: 0   TREMFYA PEN 100 MG/ML pen, Inject 100 mg into the skin once a week., Disp: , Rfl:

## 2024-06-28 NOTE — Code Documentation (Signed)
 Stroke Response Nurse Documentation Code Documentation  Bridget Maldonado is a 44 y.o. female arriving to [facility] via [employer] [facility] on 06/29/2023 with past medical hx of migraines, HTN, previous stroke with residual left sided deficits and speech difficulty, per patient. On aspirin  81 mg daily. Code stroke was activated by ED.   Patient from work where she was LKW at 2045 last night before bed and now complaining of worsening speech difficulties and left sided weakness. Patient reports her mom noticed changes in her speech while talking to her on the phone this morning after waking up. At work, she noticed left eye blurry vision and weakness of left arm and leg. Brought to the ED by coworker  Stroke team meets patient in CT after activation. Patient cleared for CT by Dr. Dorothyann. Patient to CT with team. NIHSS 13, see documentation for details and code stroke times. Patient with disoriented, bilateral arm weakness, left leg weakness, left decreased sensation, and Expressive aphasia  on exam. The following imaging was completed:  CT Head, CTA, and CTP. Patient is not a candidate for IV Thrombolytic due to outside window, per MD. Patient is not a candidate for IR due to no LVO on imaging, per MD.   Care Plan: every 2 hour NIHSS, VS, swallow screen per order.   Bedside handoff with ED RN Dorn KYM Burnard KANDICE Hershel  Stroke Response RN

## 2024-06-28 NOTE — ED Provider Notes (Signed)
 "  [facility] Provider Note    Event Date/Time   First MD Initiated Contact with Patient 06/28/24 1032     (approximate)  History   Chief Complaint: Numbness  HPI  Bridget Maldonado is a 44 y.o. 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  Patient initially stated was normal last night when she went to bed however to myself states she was having symptoms already last night she took Excedrin to see if it would help her symptoms as she has a history of hemiplegic migraines that have presented similarly in the past.  Patient states however she was not having a headache yesterday or today at any point.  Patient states she has also had hemiplegic migraines in the past without a headache but those are rare.  Patient states she was able to go to work today but around 9:00 this morning symptoms got worse where she was also having some visual changes in addition to weakness and heaviness of the left arm and speech difficulty.  Patient presented to the emergency department as a code stroke.  Physical Exam   Triage Vital Signs: ED Triage Vitals  Encounter Vitals Group     BP 06/28/24 1027 (!) 160/118     Girls Systolic BP Percentile --      Girls Diastolic BP Percentile --      Boys Systolic BP Percentile --      Boys Diastolic BP Percentile --      Pulse Rate 06/28/24 1027 88     Resp 06/28/24 1027 20     Temp 06/28/24 1027 98 F (36.7 C)     Temp Source 06/28/24 1027 Oral     SpO2 06/28/24 1027 98 %     Weight --      Height --      Head Circumference --      Peak Flow --      Pain Score 06/28/24 1056 0     Pain Loc --      Pain Education --      Exclude from Growth Chart --     Most recent vital signs: Vitals:   06/28/24 1130 06/28/24 1145  BP:    Pulse: 79 77  Resp: 11 14   Temp:    SpO2: 97% 97%    General: Awake, no distress.  CV:  Good peripheral perfusion.  Regular rate and rhythm  Resp:  Normal effort.  Equal breath sounds bilaterally.  Abd:  No distention.  Soft, nontender.  No rebound or guarding. Other:  Slightly diminished grip strength in the left upper extremity, no significant pronator drift, good strength in bilateral lower extremities.  No cranial nerve deficits.   ED Results / Procedures / Treatments   EKG  EKG viewed and interpreted by myself shows a normal sinus rhythm at 82 bpm with a narrow QRS, normal axis, normal intervals, no concerning ST changes.  RADIOLOGY  I have reviewed interpreted the CTA head images no obvious bleed seen on my evaluation. Radiology is read the CTA is negative for large vessel occlusion.  Normal CT brain perfusion.   MEDICATIONS ORDERED IN ED: Medications  sodium chloride  flush (NS) 0.9 % injection 3 mL ( Intravenous Canceled Entry 06/28/24 1038)  labetalol  (NORMODYNE ) 5 MG/ML injection (  Not Given 06/28/24 1117)  iohexol  (OMNIPAQUE ) 350 MG/ML injection 100 mL (100 mLs Intravenous Contrast Given 06/28/24 1106)     IMPRESSION / MDM / ASSESSMENT AND PLAN / ED COURSE  I reviewed the triage vital signs and the nursing notes.  Patient's presentation is most consistent with acute presentation with potential threat to life or bodily function.  Patient presents emergency department for left-sided arm heaviness and speech difficulties.  Patient states symptoms started last night but then worsened this morning around 9:00.  Patient was made a code stroke on arrival.  CTA head and neck is negative for any significant acute abnormality.  CT scan head is aspects 10 negative.  Patient's lab work today has shown a reassuring CBC reassuring chemistry.  Dr. Merrianne of neurology has seen and evaluated the patient does not feel symptoms represent acute CVA.  Given reassuring workup reassuring images.  Patient has a history of  hemiplegic migraines with similar presentations in the past however she states 90+ percent of the time they include a headache.  Patient has not had a headache yesterday or today.  As the patient continues to have a diminished left grip strength and some speech difficulty although she states it is dramatically improved I recommended that we continue to monitor the patient in fact treat as if she is having a migraine to ensure symptom resolution.  Patient states she has gone through this before her symptoms are improving and she is ready to go home.  Discussed with her as well as her father multiple times stating for further monitoring until symptoms completely resolved and if they do not she may require admission to the hospital for further workup.  Patient understands the risk but wishes to leave.  Will discharge from the emergency department.  Patient will follow-up with her doctor.  FINAL CLINICAL IMPRESSION(S) / ED DIAGNOSES   Weakness   Note:  This document was prepared using Dragon voice recognition software and may include unintentional dictation errors.   Dorothyann Drivers, MD 06/28/24 1208  "

## 2024-06-28 NOTE — ED Notes (Signed)
Code  stroke  called  to  [facility] 

## 2024-06-28 NOTE — Progress Notes (Signed)
" °   06/28/24 1030  Spiritual Encounters  Type of Visit Initial  Care provided to: Family  Referral source Code page  Reason for visit Code  OnCall Visit Yes  Interventions  Spiritual Care Interventions Made Established relationship of care and support;Compassionate presence;Reflective listening  Intervention Outcomes  Outcomes Connection to spiritual care;Awareness around self/spiritual resourses  Spiritual Care Plan  Spiritual Care Issues Still Outstanding No further spiritual care needs at this time (see row info)   Chaplain responded to code stroke and pt was in CT, however, her aunt, Bridget Maldonado was present and chaplain showed compassionate presence.  "

## 2024-06-28 NOTE — ED Notes (Signed)
 Denies HA, visual changes, or dizziness. Steady gait. States, feel better, ready to go, want to go home. Family present at [facility]. Denies questions or needs. BP elevated. VSS.

## 2024-09-13 ENCOUNTER — Encounter
